# Patient Record
Sex: Male | Born: 1955 | Race: Black or African American | Hispanic: No | Marital: Married | Smoking: Current every day smoker
Health system: Southern US, Community
[De-identification: ages and names within clinical notes are randomized; demographics above are authoritative.]

## PROBLEM LIST (undated history)

## (undated) DIAGNOSIS — E291 Testicular hypofunction: Secondary | ICD-10-CM

## (undated) DIAGNOSIS — E785 Hyperlipidemia, unspecified: Secondary | ICD-10-CM

## (undated) DIAGNOSIS — C801 Malignant (primary) neoplasm, unspecified: Secondary | ICD-10-CM

## (undated) DIAGNOSIS — I10 Essential (primary) hypertension: Secondary | ICD-10-CM

## (undated) DIAGNOSIS — F32A Depression, unspecified: Secondary | ICD-10-CM

## (undated) DIAGNOSIS — C9000 Multiple myeloma not having achieved remission: Secondary | ICD-10-CM

## (undated) HISTORY — DX: Hyperlipidemia, unspecified: E78.5

## (undated) HISTORY — PX: BONE MARROW BIOPSY: SHX199

## (undated) HISTORY — DX: Testicular hypofunction: E29.1

## (undated) HISTORY — DX: Depression, unspecified: F32.A

---

## 2008-07-14 ENCOUNTER — Ambulatory Visit: Payer: Self-pay | Admitting: Unknown Physician Specialty

## 2008-07-14 HISTORY — PX: COLONOSCOPY: SHX174

## 2010-07-29 ENCOUNTER — Ambulatory Visit: Payer: Self-pay | Admitting: Otolaryngology

## 2013-12-01 DIAGNOSIS — I10 Essential (primary) hypertension: Secondary | ICD-10-CM | POA: Insufficient documentation

## 2013-12-01 DIAGNOSIS — E785 Hyperlipidemia, unspecified: Secondary | ICD-10-CM | POA: Insufficient documentation

## 2013-12-01 DIAGNOSIS — F325 Major depressive disorder, single episode, in full remission: Secondary | ICD-10-CM | POA: Insufficient documentation

## 2014-03-07 DIAGNOSIS — R053 Chronic cough: Secondary | ICD-10-CM | POA: Insufficient documentation

## 2015-02-02 DIAGNOSIS — R0683 Snoring: Secondary | ICD-10-CM | POA: Insufficient documentation

## 2016-09-06 ENCOUNTER — Emergency Department
Admission: EM | Admit: 2016-09-06 | Discharge: 2016-09-06 | Disposition: A | Discharge status: Home / Self Care | Payer: Self-pay | Attending: Emergency Medicine | Admitting: Emergency Medicine

## 2016-09-06 ENCOUNTER — Encounter: Payer: Self-pay | Admitting: Emergency Medicine

## 2016-09-06 ENCOUNTER — Emergency Department: Payer: Self-pay

## 2016-09-06 DIAGNOSIS — I10 Essential (primary) hypertension: Secondary | ICD-10-CM | POA: Insufficient documentation

## 2016-09-06 DIAGNOSIS — L03221 Cellulitis of neck: Secondary | ICD-10-CM

## 2016-09-06 DIAGNOSIS — F17200 Nicotine dependence, unspecified, uncomplicated: Secondary | ICD-10-CM | POA: Insufficient documentation

## 2016-09-06 DIAGNOSIS — Z79899 Other long term (current) drug therapy: Secondary | ICD-10-CM | POA: Insufficient documentation

## 2016-09-06 HISTORY — DX: Essential (primary) hypertension: I10

## 2016-09-06 LAB — CBC WITH DIFFERENTIAL/PLATELET
BASOS ABS: 0 10*3/uL (ref 0–0.1)
Basophils Relative: 1 %
Eosinophils Absolute: 0.3 10*3/uL (ref 0–0.7)
Eosinophils Relative: 5 %
HCT: 51.3 % (ref 40.0–52.0)
HEMOGLOBIN: 17 g/dL (ref 13.0–18.0)
LYMPHS ABS: 2.1 10*3/uL (ref 1.0–3.6)
Lymphocytes Relative: 33 %
MCH: 28.6 pg (ref 26.0–34.0)
MCHC: 33.2 g/dL (ref 32.0–36.0)
MCV: 86 fL (ref 80.0–100.0)
Monocytes Absolute: 0.6 10*3/uL (ref 0.2–1.0)
Monocytes Relative: 9 %
NEUTROS ABS: 3.5 10*3/uL (ref 1.4–6.5)
Neutrophils Relative %: 52 %
Platelets: 168 10*3/uL (ref 150–440)
RBC: 5.96 MIL/uL — AB (ref 4.40–5.90)
RDW: 15.3 % — ABNORMAL HIGH (ref 11.5–14.5)
WBC: 6.5 10*3/uL (ref 3.8–10.6)

## 2016-09-06 LAB — COMPREHENSIVE METABOLIC PANEL
ALK PHOS: 47 U/L (ref 38–126)
ALT: 23 U/L (ref 17–63)
AST: 21 U/L (ref 15–41)
Albumin: 4 g/dL (ref 3.5–5.0)
Anion gap: 6 (ref 5–15)
BUN: 9 mg/dL (ref 6–20)
CALCIUM: 9.3 mg/dL (ref 8.9–10.3)
CO2: 26 mmol/L (ref 22–32)
CREATININE: 0.98 mg/dL (ref 0.61–1.24)
Chloride: 106 mmol/L (ref 101–111)
Glucose, Bld: 93 mg/dL (ref 65–99)
Potassium: 4.7 mmol/L (ref 3.5–5.1)
Sodium: 138 mmol/L (ref 135–145)
Total Bilirubin: 0.9 mg/dL (ref 0.3–1.2)
Total Protein: 7.2 g/dL (ref 6.5–8.1)

## 2016-09-06 MED ORDER — IOPAMIDOL (ISOVUE-300) INJECTION 61%
75.0000 mL | Freq: Once | INTRAVENOUS | Status: AC | PRN
  Administered 2016-09-06: 75 mL via INTRAVENOUS
  Filled 2016-09-06: qty 75

## 2016-09-06 MED ORDER — SULFAMETHOXAZOLE-TRIMETHOPRIM 800-160 MG PO TABS: 1.0000 | ORAL_TABLET | Freq: Two times a day (BID) | ORAL | 0 refills | Status: DC

## 2016-09-06 MED ORDER — CEPHALEXIN 500 MG PO CAPS: 500.0000 mg | ORAL_CAPSULE | Freq: Three times a day (TID) | ORAL | 0 refills | Status: AC

## 2016-09-06 NOTE — ED Triage Notes (Signed)
Pt sent here from [facility] for possible tonsilar abscess. Pt reports sore throat when swallowing, no drool, voice clear.

## 2016-09-06 NOTE — ED Provider Notes (Signed)
Medical screening examination/treatment/procedure(s) were conducted as a shared visit with non-physician practitioner(s) and myself.  I personally evaluated the patient during the encounter.  Case discussed with NP Triplett, think the patient has a small localized cellulitis over the left anterior neck. No evidence of Ludwig angina or other airways compromise. No evidence of abscess. Not consistent with necrotizing fasciitis as there is no crepitus, patient afebrile and very well appearing.  Counseled the patient on taking oral antibodies for the next week for this. I expect it will heal up without any trouble. Did counsel him on return precautions and to come back immediately if he has any worsening shortness of breath cough dysphagia or pain in the area or feels like he has an enlarging mass in the neck.  CT report was reviewed and considered in my medical decision making.   Carrie Mew, MD 09/06/16 1314

## 2016-09-06 NOTE — ED Notes (Signed)
Sent over from [facility] with sore throat  States he developed some swelling to left side of throat couple of days ago states he thinks he has an abscess  Pt is afebrile on arrival

## 2016-09-06 NOTE — ED Provider Notes (Signed)
[facility] Emergency Department Provider Note  ____________________________________________  Time seen: Approximately 9:42 AM  I have reviewed the triage vital signs and the nursing notes.   HISTORY  Chief Complaint Sore Throat    HPI Tommy Smith. is a 61 y.o. male who presents to the emergency department for evaluation of a lesion on his neck that is present since Saturday. He states that at that time, he noticed a tender area, but didn't give it much thought until Sunday it began getting bigger. He states that he went into the walk-in clinic today because he thought it was an abscess or an ingrown hair and was sent to the emergency department. He denies any fever, shortness of breath, dysphasia, or any other symptoms of concern with the exception of the possibility that there may be infection. He has not taken any medications or tried any treatments prior to coming for evaluation. He denies previous similar symptoms  Past Medical History:  Diagnosis Date  . Hypertension     There are no active problems to display for this patient.   History reviewed. No pertinent surgical history.  Prior to Admission medications   Medication Sig Start Date End Date Taking? Authorizing Provider  atorvastatin (LIPITOR) 40 MG tablet Take 40 mg by mouth daily.   Yes [provider]  losartan-hydrochlorothiazide (HYZAAR) 100-25 MG tablet Take 1 tablet by mouth daily.   Yes [provider]  cephALEXin (KEFLEX) 500 MG capsule Take 1 capsule (500 mg total) by mouth 3 (three) times daily. 09/06/16 09/16/16  Brown Dunlap, Johnette Abraham B, FNP  sulfamethoxazole-trimethoprim (BACTRIM DS,SEPTRA DS) 800-160 MG tablet Take 1 tablet by mouth 2 (two) times daily. 09/06/16   Victorino Dike, FNP    Allergies Patient has no known allergies.  No family history on file.  Social History Social History  Substance Use Topics  . Smoking status: Smoker, Current Status Unknown   . Smokeless tobacco: Never Used  . Alcohol use No    Review of Systems Constitutional: Negative for fever. Eyes: No visual changes. ENT: Negative for sore throat; Negative for difficulty swallowing. Respiratory: Denies shortness of breath. Gastrointestinal: No abdominal pain.  No nausea, no vomiting.  No diarrhea.  Genitourinary: Negative for dysuria. Musculoskeletal: Negative for generalized body aches. Skin: Negative for rash. Positive for lesion. Neurological: Negative for headaches, focal weakness or numbness.  ____________________________________________   PHYSICAL EXAM:  VITAL SIGNS: ED Triage Vitals [09/06/16 0843]  Enc Vitals Group     BP      Pulse      Resp      Temp 98.3 F (36.8 C)     Temp Source Oral     SpO2      Weight 280 lb (127 kg)     Height 6\' 4"  (1.93 m)     Head Circumference      Peak Flow      Pain Score      Pain Loc      Pain Edu?      Excl. in [place]?    Constitutional: Alert and oriented. Well appearing and in no acute distress. Eyes: Conjunctivae are normal. PERRL. EOMI. Head: Atraumatic. Nose: No congestion/rhinnorhea. Mouth/Throat: Mucous membranes are moist.  Oropharynx normal, no tonsillar exudate. Neck: No stridor. Skin of the anterior portion of the neck is loose. Lesion/mass is mildly erythematous, mobile, and approximately 4cm diameter without fluctuance.  Lymphatic: Lymphadenopathy: None Cardiovascular: Normal rate, regular rhythm. Good peripheral circulation. Respiratory: Normal respiratory  effort. Lungs CTAB. Musculoskeletal: No lower extremity tenderness nor edema.   Neurologic:  Normal speech and language. No gross focal neurologic deficits are appreciated. Speech is normal. No gait instability. Skin:  Skin is warm, dry and intact. No rash noted Psychiatric: Mood and affect are normal. Speech and behavior are normal.  ____________________________________________   LABS (all labs ordered are listed, but only abnormal  results are displayed)  Labs Reviewed  CBC WITH DIFFERENTIAL/PLATELET - Abnormal; Notable for the following:       Result Value   RBC 5.96 (*)    RDW 15.3 (*)    All other components within normal limits  COMPREHENSIVE METABOLIC PANEL   ____________________________________________  EKG   ____________________________________________  RADIOLOGY  CT Soft tissue neck with contrast:  New submental space edema/inflammation, nonspecific. No abscess. Mild submental lymph node enlargement, likely reactive. ____________________________________________   PROCEDURES  Procedure(s) performed: None  Critical Care performed: No ____________________________________________   INITIAL IMPRESSION / ASSESSMENT AND PLAN / ED COURSE  Pertinent labs & imaging results that were available during my care of the patient were reviewed by me and considered in my medical decision making (see chart for details).  61 year old male presenting to the emergency department for evaluation of a lesion on the anterior portion of his neck is present for the past 3 or 4 days. Elasticity of the skin would provide a large area of extension of any abscess or mass, therefore CT with contrast ordered to better assess the depth and character of the lesion. CT does not show any localized, drainable fluid collection. There is no evidence of Ludwigs angina and the patient continued to remain asymptomatic while in the emergency department. He'll be treated for an early cellulitis with Bactrim and Keflex. Dr. Joni Fears was included in the patient's care and he also examined the patient and feels that he is safe for discharge at this time. Strict return precautions were discussed. He is to return for any concerns. He is also to schedule an appointment to see his primary care provider in 2 days for recheck. ____________________________________________  Discharge Medication List as of 09/06/2016  1:13 PM    START taking these  medications   Details  cephALEXin (KEFLEX) 500 MG capsule Take 1 capsule (500 mg total) by mouth 3 (three) times daily., Starting Tue 09/06/2016, Until Fri 09/16/2016, Print    sulfamethoxazole-trimethoprim (BACTRIM DS,SEPTRA DS) 800-160 MG tablet Take 1 tablet by mouth 2 (two) times daily., Starting Tue 09/06/2016, Print        FINAL CLINICAL IMPRESSION(S) / ED DIAGNOSES  Final diagnoses:  Cellulitis of neck    If controlled substance prescribed during this visit, 12 month history viewed on the [facility] prior to issuing an initial prescription for Schedule II or III opiod.   Note:  This document was prepared using Dragon voice recognition software and may include unintentional dictation errors.    Victorino Dike, FNP 09/06/16 1413    Carrie Mew, MD 09/06/16 2027

## 2019-02-05 ENCOUNTER — Other Ambulatory Visit: Payer: Self-pay | Admitting: Physician Assistant

## 2019-02-05 DIAGNOSIS — Z136 Encounter for screening for cardiovascular disorders: Secondary | ICD-10-CM

## 2019-05-02 DIAGNOSIS — F17200 Nicotine dependence, unspecified, uncomplicated: Secondary | ICD-10-CM | POA: Insufficient documentation

## 2019-05-13 ENCOUNTER — Other Ambulatory Visit: Payer: Self-pay | Admitting: Physician Assistant

## 2019-05-13 DIAGNOSIS — Z136 Encounter for screening for cardiovascular disorders: Secondary | ICD-10-CM

## 2019-05-13 DIAGNOSIS — F17200 Nicotine dependence, unspecified, uncomplicated: Secondary | ICD-10-CM

## 2019-06-05 ENCOUNTER — Other Ambulatory Visit: Payer: Self-pay

## 2019-06-05 ENCOUNTER — Ambulatory Visit
Admission: RE | Admit: 2019-06-05 | Discharge: 2019-06-05 | Disposition: A | Discharge status: Home / Self Care | Payer: Medicare Other | Source: Ambulatory Visit | Attending: Physician Assistant | Admitting: Physician Assistant

## 2019-06-05 DIAGNOSIS — F17200 Nicotine dependence, unspecified, uncomplicated: Secondary | ICD-10-CM | POA: Diagnosis present

## 2019-06-05 DIAGNOSIS — Z136 Encounter for screening for cardiovascular disorders: Secondary | ICD-10-CM | POA: Diagnosis not present

## 2019-06-05 DIAGNOSIS — I77811 Abdominal aortic ectasia: Secondary | ICD-10-CM | POA: Diagnosis not present

## 2019-07-08 ENCOUNTER — Other Ambulatory Visit: Payer: Self-pay | Admitting: Physician Assistant

## 2019-07-08 DIAGNOSIS — I10 Essential (primary) hypertension: Secondary | ICD-10-CM

## 2019-07-08 DIAGNOSIS — R918 Other nonspecific abnormal finding of lung field: Secondary | ICD-10-CM

## 2020-01-22 ENCOUNTER — Other Ambulatory Visit: Payer: Self-pay | Admitting: Family Medicine

## 2020-01-22 DIAGNOSIS — R109 Unspecified abdominal pain: Secondary | ICD-10-CM

## 2020-01-23 ENCOUNTER — Other Ambulatory Visit: Payer: Self-pay | Admitting: Family Medicine

## 2020-01-23 DIAGNOSIS — R911 Solitary pulmonary nodule: Secondary | ICD-10-CM

## 2020-01-29 ENCOUNTER — Ambulatory Visit: Payer: Medicare Other

## 2020-01-29 ENCOUNTER — Ambulatory Visit
Admission: RE | Admit: 2020-01-29 | Discharge: 2020-01-29 | Disposition: A | Discharge status: Home / Self Care | Payer: Medicare Other | Source: Ambulatory Visit | Attending: Family Medicine | Admitting: Family Medicine

## 2020-01-29 ENCOUNTER — Other Ambulatory Visit: Payer: Self-pay

## 2020-01-29 DIAGNOSIS — R109 Unspecified abdominal pain: Secondary | ICD-10-CM | POA: Insufficient documentation

## 2020-01-29 DIAGNOSIS — R911 Solitary pulmonary nodule: Secondary | ICD-10-CM | POA: Insufficient documentation

## 2020-01-29 MED ORDER — IOHEXOL 300 MG/ML  SOLN
100.0000 mL | Freq: Once | INTRAMUSCULAR | Status: AC | PRN
  Administered 2020-01-29: 100 mL via INTRAVENOUS

## 2020-02-04 ENCOUNTER — Inpatient Hospital Stay: Payer: Medicare Other

## 2020-02-04 ENCOUNTER — Inpatient Hospital Stay: Payer: Medicare Other | Attending: Oncology | Admitting: Oncology

## 2020-02-04 ENCOUNTER — Encounter: Payer: Self-pay | Admitting: Oncology

## 2020-02-04 ENCOUNTER — Other Ambulatory Visit: Payer: Self-pay

## 2020-02-04 VITALS — BP 135/84 | HR 70 | Temp 97.8°F | Resp 18 | Ht 74.0 in | Wt 270.8 lb

## 2020-02-04 DIAGNOSIS — M899 Disorder of bone, unspecified: Secondary | ICD-10-CM | POA: Diagnosis not present

## 2020-02-04 DIAGNOSIS — F329 Major depressive disorder, single episode, unspecified: Secondary | ICD-10-CM | POA: Insufficient documentation

## 2020-02-04 DIAGNOSIS — R778 Other specified abnormalities of plasma proteins: Secondary | ICD-10-CM

## 2020-02-04 DIAGNOSIS — R918 Other nonspecific abnormal finding of lung field: Secondary | ICD-10-CM

## 2020-02-04 DIAGNOSIS — E291 Testicular hypofunction: Secondary | ICD-10-CM | POA: Diagnosis not present

## 2020-02-04 DIAGNOSIS — J984 Other disorders of lung: Secondary | ICD-10-CM | POA: Insufficient documentation

## 2020-02-04 DIAGNOSIS — Z7189 Other specified counseling: Secondary | ICD-10-CM

## 2020-02-04 DIAGNOSIS — M8580 Other specified disorders of bone density and structure, unspecified site: Secondary | ICD-10-CM | POA: Insufficient documentation

## 2020-02-04 DIAGNOSIS — F17210 Nicotine dependence, cigarettes, uncomplicated: Secondary | ICD-10-CM | POA: Diagnosis not present

## 2020-02-04 DIAGNOSIS — M898X9 Other specified disorders of bone, unspecified site: Secondary | ICD-10-CM

## 2020-02-04 DIAGNOSIS — I700 Atherosclerosis of aorta: Secondary | ICD-10-CM | POA: Diagnosis not present

## 2020-02-04 DIAGNOSIS — R937 Abnormal findings on diagnostic imaging of other parts of musculoskeletal system: Secondary | ICD-10-CM | POA: Insufficient documentation

## 2020-02-04 DIAGNOSIS — M545 Low back pain, unspecified: Secondary | ICD-10-CM | POA: Diagnosis not present

## 2020-02-04 DIAGNOSIS — M4854XA Collapsed vertebra, not elsewhere classified, thoracic region, initial encounter for fracture: Secondary | ICD-10-CM | POA: Insufficient documentation

## 2020-02-04 DIAGNOSIS — I10 Essential (primary) hypertension: Secondary | ICD-10-CM | POA: Insufficient documentation

## 2020-02-04 DIAGNOSIS — E785 Hyperlipidemia, unspecified: Secondary | ICD-10-CM | POA: Insufficient documentation

## 2020-02-04 DIAGNOSIS — S22070A Wedge compression fracture of T9-T10 vertebra, initial encounter for closed fracture: Secondary | ICD-10-CM

## 2020-02-04 DIAGNOSIS — R748 Abnormal levels of other serum enzymes: Secondary | ICD-10-CM | POA: Insufficient documentation

## 2020-02-04 DIAGNOSIS — Z79899 Other long term (current) drug therapy: Secondary | ICD-10-CM | POA: Insufficient documentation

## 2020-02-04 LAB — CBC WITH DIFFERENTIAL/PLATELET
Abs Immature Granulocytes: 0.02 10*3/uL (ref 0.00–0.07)
Basophils Absolute: 0 10*3/uL (ref 0.0–0.1)
Basophils Relative: 1 %
Eosinophils Absolute: 0.1 10*3/uL (ref 0.0–0.5)
Eosinophils Relative: 3 %
HCT: 35.6 % — ABNORMAL LOW (ref 39.0–52.0)
Hemoglobin: 11.9 g/dL — ABNORMAL LOW (ref 13.0–17.0)
Immature Granulocytes: 1 %
Lymphocytes Relative: 49 %
Lymphs Abs: 2 10*3/uL (ref 0.7–4.0)
MCH: 29.8 pg (ref 26.0–34.0)
MCHC: 33.4 g/dL (ref 30.0–36.0)
MCV: 89.2 fL (ref 80.0–100.0)
Monocytes Absolute: 0.4 10*3/uL (ref 0.1–1.0)
Monocytes Relative: 10 %
Neutro Abs: 1.5 10*3/uL — ABNORMAL LOW (ref 1.7–7.7)
Neutrophils Relative %: 36 %
Platelets: 174 10*3/uL (ref 150–400)
RBC: 3.99 MIL/uL — ABNORMAL LOW (ref 4.22–5.81)
RDW: 15 % (ref 11.5–15.5)
WBC: 4.1 10*3/uL (ref 4.0–10.5)
nRBC: 0 % (ref 0.0–0.2)

## 2020-02-04 LAB — COMPREHENSIVE METABOLIC PANEL
ALT: 24 U/L (ref 0–44)
AST: 22 U/L (ref 15–41)
Albumin: 2.7 g/dL — ABNORMAL LOW (ref 3.5–5.0)
Alkaline Phosphatase: 40 U/L (ref 38–126)
Anion gap: 9 (ref 5–15)
BUN: 13 mg/dL (ref 8–23)
CO2: 27 mmol/L (ref 22–32)
Calcium: 11.5 mg/dL — ABNORMAL HIGH (ref 8.9–10.3)
Chloride: 100 mmol/L (ref 98–111)
Creatinine, Ser: 1.01 mg/dL (ref 0.61–1.24)
GFR, Estimated: >60 mL/min (ref >60)
Glucose, Bld: 89 mg/dL (ref 70–99)
Potassium: 3.6 mmol/L (ref 3.5–5.1)
Sodium: 136 mmol/L (ref 135–145)
Total Bilirubin: 0.7 mg/dL (ref 0.3–1.2)
Total Protein: 9.8 g/dL — ABNORMAL HIGH (ref 6.5–8.1)

## 2020-02-04 LAB — PSA: Prostatic Specific Antigen: 0.03 ng/mL (ref 0.00–4.00)

## 2020-02-04 NOTE — Progress Notes (Signed)
Hematology/Oncology Consult note [facility] Telephone:([phone] [phone]   Patient Care Team: [name], [place] as PCP - General (Physician Assistant)  REFERRING PROVIDER: Sofie Hartigan, MD  CHIEF COMPLAINTS/REASON FOR VISIT:  Evaluation of abnormal serum protein electrophoresis  HISTORY OF PRESENTING ILLNESS:   Tommy Smith. is a  64 y.o.  male with PMH listed below was seen in consultation at the request of  Feldpausch, Chrissie Noa, MD  for evaluation of abnormal serum protein electrophoresis.  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  Increase calcium level at 12.3, PTH was normal.  Creatinine 1, estimated GFR 91, CBC showed hemoglobin 12.4, Patient also recently had a CT chest abdomen pelvis with contrast for evaluation of right flank pain and right lower lobe nodule. 01/29/2020, CT showed multiple bilateral pulmonary nodules measuring up to 10 mm in the right middle lobe, nodules are indeterminate, infectious/inflammatory etiology versus metastatic disease. Multiple osseous lytic lesions, largest lesion involving the left pubic bone concerning for metastatic disease. Compression fractures at T9, age indeterminate. Appearance focal area of thickening at the gastroesophageal junction.  Further evaluation with upper GI study or direct visualization with endoscopy is recommended No bowel obstruction. Aortic atherosclerosis.  Patient reports NSAIDs as needed for lower back pain.  Mid lower back pain usually is worse when when he stands up from sitting position.  Patient works in the [employer].  Lives at home with wife.  Denies any unintentional weight loss, fever, chills, night sweating  Review of Systems  Constitutional: Negative for appetite change, chills, fatigue, fever and unexpected weight change.  HENT:   Negative for hearing loss and voice change.   Eyes: Negative for eye problems and  icterus.  Respiratory: Negative for chest tightness, cough and shortness of breath.   Cardiovascular: Negative for chest pain and leg swelling.  Gastrointestinal: Negative for abdominal distention and abdominal pain.  Endocrine: Negative for hot flashes.  Genitourinary: Negative for difficulty urinating, dysuria and frequency.   Musculoskeletal: Positive for back pain. Negative for arthralgias.  Skin: Negative for itching and rash.  Neurological: Negative for light-headedness and numbness.  Hematological: Negative for adenopathy. Does not bruise/bleed easily.  Psychiatric/Behavioral: Negative for confusion.    MEDICAL HISTORY:  Past Medical History:  Diagnosis Date  . Depression   . Hyperlipemia   . Hypertension   . Hypogonadism in male     SURGICAL HISTORY: Past Surgical History:  Procedure Laterality Date  . COLONOSCOPY  07/14/2008    SOCIAL HISTORY: Social History   Socioeconomic History  . Marital status: Married    Spouse name: Not on file  . Number of children: Not on file  . Years of education: Not on file  . Highest education level: Not on file  Occupational History  . Not on file  Tobacco Use  . Smoking status: Current Every Day Smoker    Packs/day: 1.00    Years: 30.00    Pack years: 30.00    Types: Cigarettes  . Smokeless tobacco: Never Used  Substance and Sexual Activity  . Alcohol use: No  . Drug use: No  . Sexual activity: Not on file  Other Topics Concern  . Not on file  Social History Narrative  . Not on file   Social Determinants of Health   Financial Resource Strain:   . Difficulty of Paying Living Expenses: Not on file  Food Insecurity:   . Worried About Charity fundraiser in the Last Year:  Not on file  . Ran Out of Food in the Last Year: Not on file  Transportation Needs:   . Lack of Transportation (Medical): Not on file  . Lack of Transportation (Non-Medical): Not on file  Physical Activity:   . Days of Exercise per Week: Not on  file  . Minutes of Exercise per Session: Not on file  Stress:   . Feeling of Stress : Not on file  Social Connections:   . Frequency of Communication with Friends and Family: Not on file  . Frequency of Social Gatherings with Friends and Family: Not on file  . Attends Religious Services: Not on file  . Active Member of Clubs or Organizations: Not on file  . Attends Archivist Meetings: Not on file  . Marital Status: Not on file  Intimate Partner Violence:   . Fear of Current or Ex-Partner: Not on file  . Emotionally Abused: Not on file  . Physically Abused: Not on file  . Sexually Abused: Not on file    FAMILY HISTORY: Family History  Problem Relation Age of Onset  . Skin cancer Mother   . Prostate cancer Father     ALLERGIES:  has No Known Allergies.  MEDICATIONS:  Current Outpatient Medications  Medication Sig Dispense Refill  . amLODipine (NORVASC) 10 MG tablet Take by mouth.    Marland Kitchen atorvastatin (LIPITOR) 40 MG tablet Take 40 mg by mouth daily.    Marland Kitchen buPROPion (WELLBUTRIN XL) 150 MG 24 hr tablet Take by mouth.    . dutasteride (AVODART) 0.5 MG capsule Take by mouth.    Marland Kitchen ibuprofen (ADVIL) 200 MG tablet Take 200 mg by mouth every 6 (six) hours as needed.    Marland Kitchen losartan-hydrochlorothiazide (HYZAAR) 100-12.5 MG tablet Take 1 tablet by mouth daily.    . Psyllium (METAMUCIL MULTIHEALTH FIBER PO) Take by mouth daily.     No current facility-administered medications for this visit.     PHYSICAL EXAMINATION: ECOG PERFORMANCE STATUS: 0 - Asymptomatic Vitals:   02/04/20 0953  BP: 135/84  Pulse: 70  Resp: 18  Temp: 97.8 F (36.6 C)   Filed Weights   02/04/20 0953  Weight: 270 lb 12.8 oz (122.8 kg)    Physical Exam Constitutional:      General: He is not in acute distress. HENT:     Head: Normocephalic and atraumatic.  Eyes:     General: No scleral icterus. Cardiovascular:     Rate and Rhythm: Normal rate and regular rhythm.     Heart sounds: Normal  heart sounds.  Pulmonary:     Effort: Pulmonary effort is normal. No respiratory distress.     Breath sounds: No wheezing.  Abdominal:     General: Bowel sounds are normal. There is no distension.     Palpations: Abdomen is soft.  Musculoskeletal:        General: No deformity. Normal range of motion.     Cervical back: Normal range of motion and neck supple.  Skin:    General: Skin is warm and dry.     Findings: No erythema or rash.  Neurological:     Mental Status: He is alert and oriented to person, place, and time. Mental status is at baseline.     Cranial Nerves: No cranial nerve deficit.     Coordination: Coordination normal.  Psychiatric:        Mood and Affect: Mood normal.     LABORATORY DATA:  I have reviewed the data  as listed Lab Results  Component Value Date   WBC 4.1 02/04/2020   HGB 11.9 (L) 02/04/2020   HCT 35.6 (L) 02/04/2020   MCV 89.2 02/04/2020   PLT 174 02/04/2020   Recent Labs    02/04/20 1029  NA 136  K 3.6  CL 100  CO2 27  GLUCOSE 89  BUN 13  CREATININE 1.01  CALCIUM 11.5*  GFRNONAA >60  PROT 9.8*  ALBUMIN 2.7*  AST 22  ALT 24  ALKPHOS 40  BILITOT 0.7   Iron/TIBC/Ferritin/ %Sat No results found for: IRON, TIBC, FERRITIN, IRONPCTSAT    RADIOGRAPHIC STUDIES: I have personally reviewed the radiological images as listed and agreed with the findings in the report. CT CHEST W CONTRAST  Result Date: 01/30/2020 CLINICAL DATA:  64 year old male with right flank pain. Right lower lobe pulmonary nodule. EXAM: CT CHEST, ABDOMEN, AND PELVIS WITH CONTRAST TECHNIQUE: Multidetector CT imaging of the chest, abdomen and pelvis was performed following the standard protocol during bolus administration of intravenous contrast. CONTRAST:  137m OMNIPAQUE IOHEXOL 300 MG/ML  SOLN COMPARISON:  None. FINDINGS: CT CHEST FINDINGS Cardiovascular: There is no cardiomegaly or pericardial effusion. The thoracic aorta is unremarkable. The origins of the great vessels  of the aortic arch appear patent as visualized. The central pulmonary arteries are patent. Mediastinum/Nodes: There is no hilar or mediastinal adenopathy. The esophagus and the thyroid gland are grossly unremarkable. No mediastinal fluid collection. Lungs/Pleura: There are bibasilar linear atelectasis/scarring. Multiple pulmonary nodules predominantly involving the lower lungs including a 10 mm right middle lobe nodule (98/3), a 6 mm right lower lobe subpleural nodule (93/3), a 4 mm right middle lobe nodule (80/3), a 4 mm lingular nodule (67/3), and a 3 mm left lower lobe subpleural nodule (114/3). No focal consolidation, pleural effusion, or pneumothorax. The central airways are patent. Musculoskeletal: Old healed right anterior sixth rib fracture. There is a small indeterminate lucency in the anterior aspect of the right sixth rib anterior to the old fracture (93/3) which is not evaluated on this CT. Clinical correlation and attention on follow-up recommended. If there is history of known primary cancer or other concern for metastatic disease, bone scan may provide better evaluation. Several additional faint lucency is noted in the sternum. There is compression fracture of T9 with approximately 40% loss of vertebral body height, age indeterminate. Correlation with clinical exam and point tenderness recommended. There is mild perivertebral edema at this level and therefore an acute or subacute fracture is a possibility. No retropulsed fragment. CT ABDOMEN PELVIS FINDINGS No intra-abdominal free air or free fluid. Hepatobiliary: The liver is unremarkable. No intrahepatic biliary ductal dilatation. The gallbladder is unremarkable. Pancreas: Unremarkable. No pancreatic ductal dilatation or surrounding inflammatory changes. Spleen: Normal in size without focal abnormality. Adrenals/Urinary Tract: The adrenal glands are unremarkable. There is no hydronephrosis on either side. There is symmetric enhancement and excretion  of contrast by both kidneys. The visualized ureters and urinary bladder appear unremarkable. Stomach/Bowel: There is apparent focal area of thickening at the gastroesophageal junction (51/2 and coronal 86/4). This may be related to underdistention, however underlying lesion is not excluded. Further evaluation with upper GI study or direct visualization with endoscopy is recommended. There is no bowel obstruction or active inflammation. The appendix is normal. Vascular/Lymphatic: Mild aortoiliac atherosclerotic disease. The IVC is unremarkable. No portal venous gas. There is no adenopathy. Reproductive: The prostate and seminal vesicles are grossly unremarkable. No pelvic mass. Other: None Musculoskeletal: Osteopenia with degenerative changes of the spine. There  is a 3.4 x 2.5 cm lytic lesion in the left pubic bone as well as a smaller lytic lesion in the right pubic bone. Additional faint subcentimeter lucencies may be present within the spine. Age indeterminate, old-appearing compression fracture of the inferior endplate of L4. No acute osseous pathology. IMPRESSION: 1. Multiple bilateral pulmonary nodules measuring up to 10 mm in the right middle lobe. These nodules are indeterminate and may be inflammatory/infectious in etiology. However, metastatic disease is not excluded in light of osseous findings. Clinical correlation and multidisciplinary consult advised. 2. Multiple osseous lytic lesions with the largest involving the left pubic bone concerning for metastatic disease. Further evaluation with bone scan recommended. 3. Compression fracture of T9 with approximately 40% loss of vertebral body height, age indeterminate. Correlation with clinical exam and point tenderness recommended. 4. Apparent focal area of thickening at the gastroesophageal junction. Further evaluation with upper GI study or direct visualization with endoscopy is recommended. 5. No bowel obstruction. Normal appendix. 6. Aortic  Atherosclerosis (ICD10-I70.0). Electronically Signed   By: Anner Crete M.D.   On: 01/30/2020 20:06   CT ABDOMEN PELVIS W CONTRAST  Result Date: 01/30/2020 CLINICAL DATA:  64 year old male with right flank pain. Right lower lobe pulmonary nodule. EXAM: CT CHEST, ABDOMEN, AND PELVIS WITH CONTRAST TECHNIQUE: Multidetector CT imaging of the chest, abdomen and pelvis was performed following the standard protocol during bolus administration of intravenous contrast. CONTRAST:  166m OMNIPAQUE IOHEXOL 300 MG/ML  SOLN COMPARISON:  None. FINDINGS: CT CHEST FINDINGS Cardiovascular: There is no cardiomegaly or pericardial effusion. The thoracic aorta is unremarkable. The origins of the great vessels of the aortic arch appear patent as visualized. The central pulmonary arteries are patent. Mediastinum/Nodes: There is no hilar or mediastinal adenopathy. The esophagus and the thyroid gland are grossly unremarkable. No mediastinal fluid collection. Lungs/Pleura: There are bibasilar linear atelectasis/scarring. Multiple pulmonary nodules predominantly involving the lower lungs including a 10 mm right middle lobe nodule (98/3), a 6 mm right lower lobe subpleural nodule (93/3), a 4 mm right middle lobe nodule (80/3), a 4 mm lingular nodule (67/3), and a 3 mm left lower lobe subpleural nodule (114/3). No focal consolidation, pleural effusion, or pneumothorax. The central airways are patent. Musculoskeletal: Old healed right anterior sixth rib fracture. There is a small indeterminate lucency in the anterior aspect of the right sixth rib anterior to the old fracture (93/3) which is not evaluated on this CT. Clinical correlation and attention on follow-up recommended. If there is history of known primary cancer or other concern for metastatic disease, bone scan may provide better evaluation. Several additional faint lucency is noted in the sternum. There is compression fracture of T9 with approximately 40% loss of vertebral body  height, age indeterminate. Correlation with clinical exam and point tenderness recommended. There is mild perivertebral edema at this level and therefore an acute or subacute fracture is a possibility. No retropulsed fragment. CT ABDOMEN PELVIS FINDINGS No intra-abdominal free air or free fluid. Hepatobiliary: The liver is unremarkable. No intrahepatic biliary ductal dilatation. The gallbladder is unremarkable. Pancreas: Unremarkable. No pancreatic ductal dilatation or surrounding inflammatory changes. Spleen: Normal in size without focal abnormality. Adrenals/Urinary Tract: The adrenal glands are unremarkable. There is no hydronephrosis on either side. There is symmetric enhancement and excretion of contrast by both kidneys. The visualized ureters and urinary bladder appear unremarkable. Stomach/Bowel: There is apparent focal area of thickening at the gastroesophageal junction (51/2 and coronal 86/4). This may be related to underdistention, however underlying lesion is not  excluded. Further evaluation with upper GI study or direct visualization with endoscopy is recommended. There is no bowel obstruction or active inflammation. The appendix is normal. Vascular/Lymphatic: Mild aortoiliac atherosclerotic disease. The IVC is unremarkable. No portal venous gas. There is no adenopathy. Reproductive: The prostate and seminal vesicles are grossly unremarkable. No pelvic mass. Other: None Musculoskeletal: Osteopenia with degenerative changes of the spine. There is a 3.4 x 2.5 cm lytic lesion in the left pubic bone as well as a smaller lytic lesion in the right pubic bone. Additional faint subcentimeter lucencies may be present within the spine. Age indeterminate, old-appearing compression fracture of the inferior endplate of L4. No acute osseous pathology. IMPRESSION: 1. Multiple bilateral pulmonary nodules measuring up to 10 mm in the right middle lobe. These nodules are indeterminate and may be inflammatory/infectious in  etiology. However, metastatic disease is not excluded in light of osseous findings. Clinical correlation and multidisciplinary consult advised. 2. Multiple osseous lytic lesions with the largest involving the left pubic bone concerning for metastatic disease. Further evaluation with bone scan recommended. 3. Compression fracture of T9 with approximately 40% loss of vertebral body height, age indeterminate. Correlation with clinical exam and point tenderness recommended. 4. Apparent focal area of thickening at the gastroesophageal junction. Further evaluation with upper GI study or direct visualization with endoscopy is recommended. 5. No bowel obstruction. Normal appendix. 6. Aortic Atherosclerosis (ICD10-I70.0). Electronically Signed   By: Anner Crete M.D.   On: 01/30/2020 20:06      ASSESSMENT & PLAN:  1. Abnormal SPEP   2. Lytic bone lesions on xray   3. Lung nodules   4. Compression fracture of T9 vertebra, initial encounter ([place])   5. Goals of care, counseling/discussion    #Previous labs reviewed and discussed with patient Suspect plasma cell disorder i.e. multiple myeloma.  CT findings were discussed with patient.  T9 compression fracture as well as multiple lytic lesions. Recommend patient to check CBC, CMP, beta-2 microglobulin, multiple myeloma panel, free light chain ratio, PSA, urine protein electrophoresis Discussed about the possibility of bone marrow biopsy for further evaluation.  We will arrange patient to get bone marrow biopsy ASAP.  Light chain ratio is significantly elevated.  Involved kappa light chain level 333. Encourage oral hydration.  Advised patient avoid nephrotoxins.  #Multiple small lung nodules, infectious/inflammation versus metastatic disease. Tobacco use, smoke cessation was discussed with patient #Hypercalcemia, corrected calcium level 12.5.  Encourage oral hydration.  Consider bisphosphonate after bone marrow biopsy. #T9 compression fracture,  age-indeterminate.  Recommend patient to avoid NSAIDs.  Avoid heavy lifting. Recommend Tylenol 650 mg every 6 hours as needed for pain.  Patient will update me if Tylenol does not help his pain.  #Thickening of the GE junction on CT scan.  Will need GI work-up.  Orders Placed This Encounter  Procedures  . Multiple Myeloma Panel (SPEP&IFE w/QIG)    Standing Status:   Future    Number of Occurrences:   1    Standing Expiration Date:   02/03/2021  . Kappa/lambda light chains    Standing Status:   Future    Number of Occurrences:   1    Standing Expiration Date:   02/03/2021  . CBC with Differential/Platelet    Standing Status:   Future    Number of Occurrences:   1    Standing Expiration Date:   02/03/2021  . Comprehensive metabolic panel    Standing Status:   Future    Number of Occurrences:  1    Standing Expiration Date:   02/03/2021  . Beta 2 microglobuline, serum    Standing Status:   Future    Number of Occurrences:   1    Standing Expiration Date:   02/03/2021  . PSA    Standing Status:   Future    Number of Occurrences:   1    Standing Expiration Date:   02/03/2021  . Protein Electro, Random Urine    Standing Status:   Future    Standing Expiration Date:   02/03/2021    All questions were answered. The patient knows to call the clinic with any problems questions or concerns.  cc Tommy Hartigan, MD    Return of visit: 2 weeks after bone marrow biopsy. Thank you for this kind referral and the opportunity to participate in the care of this patient. A copy of today's note is routed to referring provider    Earlie Server, MD, PhD Hematology Oncology [facility] at [facility] Pager- [phone] 02/04/2020

## 2020-02-04 NOTE — Progress Notes (Signed)
New patient evaluation.   

## 2020-02-05 LAB — PROTEIN ELECTRO, RANDOM URINE
Albumin ELP, Urine: 15 %
Alpha-1-Globulin, U: 3.4 %
Alpha-2-Globulin, U: 16.7 %
Beta Globulin, U: 46.7 %
Gamma Globulin, U: 18.2 %
M Component, Ur: 25.3 % — ABNORMAL HIGH
Total Protein, Urine: 11 mg/dL

## 2020-02-05 LAB — KAPPA/LAMBDA LIGHT CHAINS
Kappa free light chain: 333.8 mg/L — ABNORMAL HIGH (ref 3.3–19.4)
Kappa, lambda light chain ratio: 119.21 — ABNORMAL HIGH (ref 0.26–1.65)
Lambda free light chains: 2.8 mg/L — ABNORMAL LOW (ref 5.7–26.3)

## 2020-02-05 LAB — BETA 2 MICROGLOBULIN, SERUM: Beta-2 Microglobulin: 2.1 mg/L (ref 0.6–2.4)

## 2020-02-06 ENCOUNTER — Telehealth: Payer: Self-pay

## 2020-02-06 ENCOUNTER — Other Ambulatory Visit: Payer: Self-pay

## 2020-02-06 DIAGNOSIS — R778 Other specified abnormalities of plasma proteins: Secondary | ICD-10-CM

## 2020-02-06 LAB — MULTIPLE MYELOMA PANEL, SERUM
Albumin SerPl Elph-Mcnc: 3.5 g/dL (ref 2.9–4.4)
Albumin/Glob SerPl: 0.6 — ABNORMAL LOW (ref 0.7–1.7)
Alpha 1: 0.3 g/dL (ref 0.0–0.4)
Alpha2 Glob SerPl Elph-Mcnc: 0.8 g/dL (ref 0.4–1.0)
B-Globulin SerPl Elph-Mcnc: 4.6 g/dL — ABNORMAL HIGH (ref 0.7–1.3)
Gamma Glob SerPl Elph-Mcnc: 0.2 g/dL — ABNORMAL LOW (ref 0.4–1.8)
Globulin, Total: 5.9 g/dL — ABNORMAL HIGH (ref 2.2–3.9)
IgA: 45 mg/dL — ABNORMAL LOW (ref 61–437)
IgG (Immunoglobin G), Serum: 4002 mg/dL — ABNORMAL HIGH (ref 603–1613)
IgM (Immunoglobulin M), Srm: <5 mg/dL — ABNORMAL LOW (ref 20–172)
M Protein SerPl Elph-Mcnc: 4.1 g/dL — ABNORMAL HIGH
Total Protein ELP: 9.4 g/dL — ABNORMAL HIGH (ref 6.0–8.5)

## 2020-02-06 NOTE — Telephone Encounter (Signed)
Request for bone marrow biopsy faxed to centralized scheduling. Will schedule for MD follow up 2 weeks after biopsy.

## 2020-02-06 NOTE — Telephone Encounter (Signed)
-----  Message from Earlie Server, MD sent at 02/05/2020  9:25 PM EDT ----- Please arrange patient to have stat bone marrow biopsy ASAP.  Reason is abnormal SPEP, suspect multiple myeloma.  Next available appointment,

## 2020-02-10 ENCOUNTER — Telehealth: Payer: Self-pay

## 2020-02-10 NOTE — Progress Notes (Signed)
Patient on schedule for BMB 02/12/2020, called and spoke with patient on phone with pre procedure instructions given. Made aware to be here @ 0730, NPO after MN as well as driver for home post procedure/discharge. Stated understanding.

## 2020-02-10 NOTE — Telephone Encounter (Signed)
Done.. Pt has been sched to RTC on 02/27/20 @10 :45

## 2020-02-10 NOTE — Telephone Encounter (Signed)
patient scheduled for bone marrow biospy on 11/3 @ 8:30 a, arrive 7:30a. Please scheudle MD follow up 2 weeks after. I will call pt with appt details.

## 2020-02-10 NOTE — Telephone Encounter (Signed)
Pt was unavailabe due to being at work. Gave appt information to patient's wife, [name]. She wrote them down will relay appts to patient.

## 2020-02-11 ENCOUNTER — Other Ambulatory Visit: Payer: Self-pay | Admitting: Radiology

## 2020-02-12 ENCOUNTER — Telehealth: Payer: Self-pay

## 2020-02-12 ENCOUNTER — Other Ambulatory Visit: Payer: Self-pay

## 2020-02-12 ENCOUNTER — Other Ambulatory Visit: Payer: Self-pay | Admitting: Oncology

## 2020-02-12 ENCOUNTER — Ambulatory Visit
Admission: RE | Admit: 2020-02-12 | Discharge: 2020-02-12 | Disposition: A | Discharge status: Home / Self Care | Payer: Medicare Other | Source: Ambulatory Visit | Attending: Oncology | Admitting: Oncology

## 2020-02-12 DIAGNOSIS — M549 Dorsalgia, unspecified: Secondary | ICD-10-CM | POA: Diagnosis not present

## 2020-02-12 DIAGNOSIS — K5900 Constipation, unspecified: Secondary | ICD-10-CM | POA: Insufficient documentation

## 2020-02-12 DIAGNOSIS — D649 Anemia, unspecified: Secondary | ICD-10-CM | POA: Diagnosis not present

## 2020-02-12 DIAGNOSIS — F17210 Nicotine dependence, cigarettes, uncomplicated: Secondary | ICD-10-CM | POA: Diagnosis not present

## 2020-02-12 DIAGNOSIS — C9000 Multiple myeloma not having achieved remission: Secondary | ICD-10-CM | POA: Diagnosis not present

## 2020-02-12 DIAGNOSIS — C7951 Secondary malignant neoplasm of bone: Secondary | ICD-10-CM | POA: Insufficient documentation

## 2020-02-12 DIAGNOSIS — R778 Other specified abnormalities of plasma proteins: Secondary | ICD-10-CM

## 2020-02-12 DIAGNOSIS — R918 Other nonspecific abnormal finding of lung field: Secondary | ICD-10-CM | POA: Insufficient documentation

## 2020-02-12 DIAGNOSIS — M899 Disorder of bone, unspecified: Secondary | ICD-10-CM | POA: Diagnosis present

## 2020-02-12 LAB — PROTIME-INR
INR: 1.1 (ref 0.8–1.2)
Prothrombin Time: 14 seconds (ref 11.4–15.2)

## 2020-02-12 LAB — CBC WITH DIFFERENTIAL/PLATELET
Abs Immature Granulocytes: 0.02 10*3/uL (ref 0.00–0.07)
Basophils Absolute: 0 10*3/uL (ref 0.0–0.1)
Basophils Relative: 0 %
Eosinophils Absolute: 0.1 10*3/uL (ref 0.0–0.5)
Eosinophils Relative: 1 %
HCT: 37 % — ABNORMAL LOW (ref 39.0–52.0)
Hemoglobin: 12.1 g/dL — ABNORMAL LOW (ref 13.0–17.0)
Immature Granulocytes: 0 %
Lymphocytes Relative: 31 %
Lymphs Abs: 2.1 10*3/uL (ref 0.7–4.0)
MCH: 29.5 pg (ref 26.0–34.0)
MCHC: 32.7 g/dL (ref 30.0–36.0)
MCV: 90.2 fL (ref 80.0–100.0)
Monocytes Absolute: 0.6 10*3/uL (ref 0.1–1.0)
Monocytes Relative: 10 %
Neutro Abs: 3.8 10*3/uL (ref 1.7–7.7)
Neutrophils Relative %: 58 %
Platelets: 198 10*3/uL (ref 150–400)
RBC: 4.1 MIL/uL — ABNORMAL LOW (ref 4.22–5.81)
RDW: 15.7 % — ABNORMAL HIGH (ref 11.5–15.5)
WBC: 6.7 10*3/uL (ref 4.0–10.5)
nRBC: 0 % (ref 0.0–0.2)

## 2020-02-12 MED ORDER — FENTANYL CITRATE (PF) 100 MCG/2ML IJ SOLN
INTRAMUSCULAR | Status: AC | PRN
Start: 2020-02-12 — End: 2020-02-12
  Administered 2020-02-12 (×2): 50 ug via INTRAVENOUS

## 2020-02-12 MED ORDER — SODIUM CHLORIDE 0.9 % IV SOLN: INTRAVENOUS | Status: DC

## 2020-02-12 MED ORDER — HYDROCODONE-ACETAMINOPHEN 5-325 MG PO TABS: 1.0000 | ORAL_TABLET | Freq: Four times a day (QID) | ORAL | 0 refills | Status: DC | PRN

## 2020-02-12 MED ORDER — HEPARIN SOD (PORK) LOCK FLUSH 100 UNIT/ML IV SOLN
INTRAVENOUS | Status: AC
  Filled 2020-02-12: qty 5

## 2020-02-12 MED ORDER — MIDAZOLAM HCL 2 MG/2ML IJ SOLN
INTRAMUSCULAR | Status: AC | PRN
  Administered 2020-02-12 (×2): 1 mg via INTRAVENOUS

## 2020-02-12 MED ORDER — MIDAZOLAM HCL 2 MG/2ML IJ SOLN
INTRAMUSCULAR | Status: AC
  Filled 2020-02-12: qty 2

## 2020-02-12 MED ORDER — FENTANYL CITRATE (PF) 100 MCG/2ML IJ SOLN
INTRAMUSCULAR | Status: AC
  Filled 2020-02-12: qty 2

## 2020-02-12 NOTE — Consult Note (Signed)
Chief Complaint: Patient was seen in consultation today for CT guided bone marrow biopsy at the request of Yu,Zhou  Referring Physician(s): Yu,Zhou  Patient Status: [facility] - Out-pt  History of Present Illness: Tommy Larmon. is a 64 y.o. male with history of abnormal serum protein electrophoresis.  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  Patient has increased calcium level.  CT of the chest, abdomen and pelvis demonstrated small pulmonary nodules and scattered lytic bone lesions.  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  Request for CT-guided bone marrow biopsy.  Patient has been having significant back pain since September.  At rest, the back pain is approximately 4 out of 10.  He said sometimes the pain is 10 out of 10 when he is coughing or straining.  He is only taking Tylenol for his pain and this is not sufficient at this point.  He plans to contact the oncologist about additional pain medication.  He has constipation and uses laxative.  Patient has been vaccinated for COVID-19.  He denies breathing problems or chest pain.  He denies fevers or chills.  Past Medical History:  Diagnosis Date  . Depression   . Hyperlipemia   . Hypertension   . Hypogonadism in male     Past Surgical History:  Procedure Laterality Date  . COLONOSCOPY  07/14/2008    Allergies: Patient has no known allergies.  Medications: Prior to Admission medications   Medication Sig Start Date End Date Taking? Authorizing Provider  amLODipine (NORVASC) 10 MG tablet Take by mouth. 05/31/19 05/30/20 Yes [provider]  atorvastatin (LIPITOR) 40 MG tablet Take 40 mg by mouth daily.   Yes [provider]  buPROPion (WELLBUTRIN XL) 150 MG 24 hr tablet Take by mouth. 01/07/20 01/06/21 Yes [provider]  dutasteride (AVODART) 0.5 MG capsule Take by mouth. 11/20/19 11/19/20 Yes [provider]  losartan-hydrochlorothiazide  (HYZAAR) 100-12.5 MG tablet Take 1 tablet by mouth daily. 01/22/20  Yes [provider]  Psyllium (METAMUCIL MULTIHEALTH FIBER PO) Take by mouth daily.   Yes [provider]  ibuprofen (ADVIL) 200 MG tablet Take 200 mg by mouth every 6 (six) hours as needed. Patient not taking: Reported on 02/12/2020    [provider]     Family History  Problem Relation Age of Onset  . Skin cancer Mother   . Prostate cancer Father     Social History   Socioeconomic History  . Marital status: Married    Spouse name: Not on file  . Number of children: Not on file  . Years of education: Not on file  . Highest education level: Not on file  Occupational History  . Not on file  Tobacco Use  . Smoking status: Current Every Day Smoker    Packs/day: 1.00    Years: 30.00    Pack years: 30.00    Types: Cigarettes  . Smokeless tobacco: Never Used  Substance and Sexual Activity  . Alcohol use: Yes    Comment: occcassional  . Drug use: No  . Sexual activity: Not on file  Other Topics Concern  . Not on file  Social History Narrative  . Not on file   Social Determinants of Health   Financial Resource Strain:   . Difficulty of Paying Living Expenses: Not on file  Food Insecurity:   . Worried About Charity fundraiser in the Last Year: Not on file  . Ran Out of Food in the  Last Year: Not on file  Transportation Needs:   . Lack of Transportation (Medical): Not on file  . Lack of Transportation (Non-Medical): Not on file  Physical Activity:   . Days of Exercise per Week: Not on file  . Minutes of Exercise per Session: Not on file  Stress:   . Feeling of Stress : Not on file  Social Connections:   . Frequency of Communication with Friends and Family: Not on file  . Frequency of Social Gatherings with Friends and Family: Not on file  . Attends Religious Services: Not on file  . Active Member of Clubs or Organizations: Not on file  . Attends Archivist  Meetings: Not on file  . Marital Status: Not on file      Review of Systems  Constitutional: Negative for chills and fever.  Respiratory: Negative.   Cardiovascular: Negative.   Gastrointestinal: Positive for constipation.  Genitourinary: Negative.   Musculoskeletal: Positive for back pain.    Vital Signs: BP 128/87   Pulse 77   Temp 98.7 F (37.1 C) (Oral)   Resp 16   Ht _0  (1.88 m)   Wt 122.5 kg   SpO2 98%   BMI 34.67 kg/m   Physical Exam Constitutional:      Appearance: Normal appearance.  Cardiovascular:     Rate and Rhythm: Normal rate and regular rhythm.     Heart sounds: Normal heart sounds.  Pulmonary:     Effort: Pulmonary effort is normal.  Abdominal:     General: Abdomen is flat. Bowel sounds are normal.     Palpations: Abdomen is soft.  Neurological:     Mental Status: He is alert.     Imaging: CT CHEST W CONTRAST  Result Date: 01/30/2020 CLINICAL DATA:  64 year old male with right flank pain. Right lower lobe pulmonary nodule. EXAM: CT CHEST, ABDOMEN, AND PELVIS WITH CONTRAST TECHNIQUE: Multidetector CT imaging of the chest, abdomen and pelvis was performed following the standard protocol during bolus administration of intravenous contrast. CONTRAST:  178m OMNIPAQUE IOHEXOL 300 MG/ML  SOLN COMPARISON:  None. FINDINGS: CT CHEST FINDINGS Cardiovascular: There is no cardiomegaly or pericardial effusion. The thoracic aorta is unremarkable. The origins of the great vessels of the aortic arch appear patent as visualized. The central pulmonary arteries are patent. Mediastinum/Nodes: There is no hilar or mediastinal adenopathy. The esophagus and the thyroid gland are grossly unremarkable. No mediastinal fluid collection. Lungs/Pleura: There are bibasilar linear atelectasis/scarring. Multiple pulmonary nodules predominantly involving the lower lungs including a 10 mm right middle lobe nodule (98/3), a 6 mm right lower lobe subpleural nodule (93/3), a 4 mm right  middle lobe nodule (80/3), a 4 mm lingular nodule (67/3), and a 3 mm left lower lobe subpleural nodule (114/3). No focal consolidation, pleural effusion, or pneumothorax. The central airways are patent. Musculoskeletal: Old healed right anterior sixth rib fracture. There is a small indeterminate lucency in the anterior aspect of the right sixth rib anterior to the old fracture (93/3) which is not evaluated on this CT. Clinical correlation and attention on follow-up recommended. If there is history of known primary cancer or other concern for metastatic disease, bone scan may provide better evaluation. Several additional faint lucency is noted in the sternum. There is compression fracture of T9 with approximately 40% loss of vertebral body height, age indeterminate. Correlation with clinical exam and point tenderness recommended. There is mild perivertebral edema at this level and therefore an acute or subacute fracture is a  possibility. No retropulsed fragment. CT ABDOMEN PELVIS FINDINGS No intra-abdominal free air or free fluid. Hepatobiliary: The liver is unremarkable. No intrahepatic biliary ductal dilatation. The gallbladder is unremarkable. Pancreas: Unremarkable. No pancreatic ductal dilatation or surrounding inflammatory changes. Spleen: Normal in size without focal abnormality. Adrenals/Urinary Tract: The adrenal glands are unremarkable. There is no hydronephrosis on either side. There is symmetric enhancement and excretion of contrast by both kidneys. The visualized ureters and urinary bladder appear unremarkable. Stomach/Bowel: There is apparent focal area of thickening at the gastroesophageal junction (51/2 and coronal 86/4). This may be related to underdistention, however underlying lesion is not excluded. Further evaluation with upper GI study or direct visualization with endoscopy is recommended. There is no bowel obstruction or active inflammation. The appendix is normal. Vascular/Lymphatic: Mild  aortoiliac atherosclerotic disease. The IVC is unremarkable. No portal venous gas. There is no adenopathy. Reproductive: The prostate and seminal vesicles are grossly unremarkable. No pelvic mass. Other: None Musculoskeletal: Osteopenia with degenerative changes of the spine. There is a 3.4 x 2.5 cm lytic lesion in the left pubic bone as well as a smaller lytic lesion in the right pubic bone. Additional faint subcentimeter lucencies may be present within the spine. Age indeterminate, old-appearing compression fracture of the inferior endplate of L4. No acute osseous pathology. IMPRESSION: 1. Multiple bilateral pulmonary nodules measuring up to 10 mm in the right middle lobe. These nodules are indeterminate and may be inflammatory/infectious in etiology. However, metastatic disease is not excluded in light of osseous findings. Clinical correlation and multidisciplinary consult advised. 2. Multiple osseous lytic lesions with the largest involving the left pubic bone concerning for metastatic disease. Further evaluation with bone scan recommended. 3. Compression fracture of T9 with approximately 40% loss of vertebral body height, age indeterminate. Correlation with clinical exam and point tenderness recommended. 4. Apparent focal area of thickening at the gastroesophageal junction. Further evaluation with upper GI study or direct visualization with endoscopy is recommended. 5. No bowel obstruction. Normal appendix. 6. Aortic Atherosclerosis (ICD10-I70.0). Electronically Signed   By: Anner Crete M.D.   On: 01/30/2020 20:06   CT ABDOMEN PELVIS W CONTRAST  Result Date: 01/30/2020 CLINICAL DATA:  64 year old male with right flank pain. Right lower lobe pulmonary nodule. EXAM: CT CHEST, ABDOMEN, AND PELVIS WITH CONTRAST TECHNIQUE: Multidetector CT imaging of the chest, abdomen and pelvis was performed following the standard protocol during bolus administration of intravenous contrast. CONTRAST:  161m OMNIPAQUE  IOHEXOL 300 MG/ML  SOLN COMPARISON:  None. FINDINGS: CT CHEST FINDINGS Cardiovascular: There is no cardiomegaly or pericardial effusion. The thoracic aorta is unremarkable. The origins of the great vessels of the aortic arch appear patent as visualized. The central pulmonary arteries are patent. Mediastinum/Nodes: There is no hilar or mediastinal adenopathy. The esophagus and the thyroid gland are grossly unremarkable. No mediastinal fluid collection. Lungs/Pleura: There are bibasilar linear atelectasis/scarring. Multiple pulmonary nodules predominantly involving the lower lungs including a 10 mm right middle lobe nodule (98/3), a 6 mm right lower lobe subpleural nodule (93/3), a 4 mm right middle lobe nodule (80/3), a 4 mm lingular nodule (67/3), and a 3 mm left lower lobe subpleural nodule (114/3). No focal consolidation, pleural effusion, or pneumothorax. The central airways are patent. Musculoskeletal: Old healed right anterior sixth rib fracture. There is a small indeterminate lucency in the anterior aspect of the right sixth rib anterior to the old fracture (93/3) which is not evaluated on this CT. Clinical correlation and attention on follow-up recommended. If there is history  of known primary cancer or other concern for metastatic disease, bone scan may provide better evaluation. Several additional faint lucency is noted in the sternum. There is compression fracture of T9 with approximately 40% loss of vertebral body height, age indeterminate. Correlation with clinical exam and point tenderness recommended. There is mild perivertebral edema at this level and therefore an acute or subacute fracture is a possibility. No retropulsed fragment. CT ABDOMEN PELVIS FINDINGS No intra-abdominal free air or free fluid. Hepatobiliary: The liver is unremarkable. No intrahepatic biliary ductal dilatation. The gallbladder is unremarkable. Pancreas: Unremarkable. No pancreatic ductal dilatation or surrounding inflammatory  changes. Spleen: Normal in size without focal abnormality. Adrenals/Urinary Tract: The adrenal glands are unremarkable. There is no hydronephrosis on either side. There is symmetric enhancement and excretion of contrast by both kidneys. The visualized ureters and urinary bladder appear unremarkable. Stomach/Bowel: There is apparent focal area of thickening at the gastroesophageal junction (51/2 and coronal 86/4). This may be related to underdistention, however underlying lesion is not excluded. Further evaluation with upper GI study or direct visualization with endoscopy is recommended. There is no bowel obstruction or active inflammation. The appendix is normal. Vascular/Lymphatic: Mild aortoiliac atherosclerotic disease. The IVC is unremarkable. No portal venous gas. There is no adenopathy. Reproductive: The prostate and seminal vesicles are grossly unremarkable. No pelvic mass. Other: None Musculoskeletal: Osteopenia with degenerative changes of the spine. There is a 3.4 x 2.5 cm lytic lesion in the left pubic bone as well as a smaller lytic lesion in the right pubic bone. Additional faint subcentimeter lucencies may be present within the spine. Age indeterminate, old-appearing compression fracture of the inferior endplate of L4. No acute osseous pathology. IMPRESSION: 1. Multiple bilateral pulmonary nodules measuring up to 10 mm in the right middle lobe. These nodules are indeterminate and may be inflammatory/infectious in etiology. However, metastatic disease is not excluded in light of osseous findings. Clinical correlation and multidisciplinary consult advised. 2. Multiple osseous lytic lesions with the largest involving the left pubic bone concerning for metastatic disease. Further evaluation with bone scan recommended. 3. Compression fracture of T9 with approximately 40% loss of vertebral body height, age indeterminate. Correlation with clinical exam and point tenderness recommended. 4. Apparent focal area  of thickening at the gastroesophageal junction. Further evaluation with upper GI study or direct visualization with endoscopy is recommended. 5. No bowel obstruction. Normal appendix. 6. Aortic Atherosclerosis (ICD10-I70.0). Electronically Signed   By: Anner Crete M.D.   On: 01/30/2020 20:06    Labs:  CBC: Recent Labs    02/04/20 1029 02/12/20 0805  WBC 4.1 6.7  HGB 11.9* 12.1*  HCT 35.6* 37.0*  PLT 174 198    COAGS: Recent Labs    02/12/20 0805  INR 1.1    BMP: Recent Labs    02/04/20 1029  NA 136  K 3.6  CL 100  CO2 27  GLUCOSE 89  BUN 13  CALCIUM 11.5*  CREATININE 1.01  GFRNONAA >60    LIVER FUNCTION TESTS: Recent Labs    02/04/20 1029  BILITOT 0.7  AST 22  ALT 24  ALKPHOS 40  PROT 9.8*  ALBUMIN 2.7*    TUMOR MARKERS: No results for input(s): AFPTM, CEA, CA199, CHROMGRNA in the last 8760 hours.  Assessment and Plan:  64 year old with abnormal serum protein electrophoresis and lytic bone lesions.  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  Risks and benefits of CT-guided bone marrow biopsy was discussed  with the patient and/or patient's family including, but not limited to bleeding, infection, damage to adjacent structures or low yield requiring additional tests.  All of the questions were answered and there is agreement to proceed.  Consent signed and in chart.   Thank you for this interesting consult.  I greatly enjoyed meeting [employer]. and look forward to participating in their care.  A copy of this report was sent to the requesting provider on this date.  Electronically Signed: Burman Riis, MD 02/12/2020, 8:32 AM   I spent a total of  10 minutes  in face to face in clinical consultation, greater than 50% of which was counseling/coordinating care for CT-guided bone marrow biopsy.

## 2020-02-12 NOTE — Discharge Instructions (Signed)
Bone Marrow Aspiration and Bone Marrow Biopsy, Adult, Care After This sheet gives you information about how to care for yourself after your procedure. Your health care provider may also give you more specific instructions. If you have problems or questions, contact your health care provider. What can I expect after the procedure? After the procedure, it is common to have:  Mild pain and tenderness.  Swelling.  Bruising. Follow these instructions at home: Puncture site care   Follow instructions from your health care provider about how to take care of the puncture site. Make sure you: ? Wash your hands with soap and water before and after you change your bandage (dressing). If soap and water are not available, use hand sanitizer. ? Change your dressing as told by your health care provider.  Check your puncture site every day for signs of infection. Check for: ? More redness, swelling, or pain. ? Fluid or blood. ? Warmth. ? Pus or a bad smell. Activity  Return to your normal activities as told by your health care provider. Ask your health care provider what activities are safe for you.  Do not lift anything that is heavier than 10 lb (4.5 kg), or the limit that you are told, until your health care provider says that it is safe.  Do not drive for 24 hours if you were given a sedative during your procedure. General instructions   Take over-the-counter and prescription medicines only as told by your health care provider.  Do not take baths, swim, or use a hot tub until your health care provider approves. Ask your health care provider if you may take showers. You may only be allowed to take sponge baths.  If directed, put ice on the affected area. To do this: ? Put ice in a plastic bag. ? Place a towel between your skin and the bag. ? Leave the ice on for 20 minutes, 2-3 times a day.  Keep all follow-up visits as told by your health care provider. This is important. Contact a  health care provider if:  Your pain is not controlled with medicine.  You have a fever.  You have more redness, swelling, or pain around the puncture site.  You have fluid or blood coming from the puncture site.  Your puncture site feels warm to the touch.  You have pus or a bad smell coming from the puncture site. Summary  After the procedure, it is common to have mild pain, tenderness, swelling, and bruising.  Follow instructions from your health care provider about how to take care of the puncture site and what activities are safe for you.  Take over-the-counter and prescription medicines only as told by your health care provider.  Contact a health care provider if you have any signs of infection, such as fluid or blood coming from the puncture site. This information is not intended to replace advice given to you by your health care provider. Make sure you discuss any questions you have with your health care provider. Document Revised: 08/14/2018 Document Reviewed: 08/14/2018 Elsevier Patient Education  [place]. Moderate Conscious Sedation, Adult, Care After These instructions provide you with information about caring for yourself after your procedure. Your health care provider may also give you more specific instructions. Your treatment has been planned according to current medical practices, but problems sometimes occur. Call your health care provider if you have any problems or questions after your procedure. What can I expect after the procedure? After your procedure,  it is common:  To feel sleepy for several hours.  To feel clumsy and have poor balance for several hours.  To have poor judgment for several hours.  To vomit if you eat too soon. Follow these instructions at home: For at least 24 hours after the procedure:   Do not: ? Participate in activities where you could fall or become injured. ? Drive. ? Use heavy machinery. ? Drink alcohol. ? Take  sleeping pills or medicines that cause drowsiness. ? Make important decisions or sign legal documents. ? Take care of children on your own.  Rest. Eating and drinking  Follow the diet recommended by your health care provider.  If you vomit: ? Drink water, juice, or soup when you can drink without vomiting. ? Make sure you have little or no nausea before eating solid foods. General instructions  Have a responsible adult stay with you until you are awake and alert.  Take over-the-counter and prescription medicines only as told by your health care provider.  If you smoke, do not smoke without supervision.  Keep all follow-up visits as told by your health care provider. This is important. Contact a health care provider if:  You keep feeling nauseous or you keep vomiting.  You feel light-headed.  You develop a rash.  You have a fever. Get help right away if:  You have trouble breathing. This information is not intended to replace advice given to you by your health care provider. Make sure you discuss any questions you have with your health care provider. Document Revised: 03/10/2017 Document Reviewed: 07/18/2015 Elsevier Patient Education  [place].   Needle Biopsy, Care After These instructions tell you how to care for yourself after your procedure. Your doctor may also give you more specific instructions. Call your doctor if you have any problems or questions. What can I expect after the procedure? After the procedure, it is common to have:  Soreness.  Bruising.  Mild pain. Follow these instructions at home:   Return to your normal activities as told by your doctor. Ask your doctor what activities are safe for you.  Take over-the-counter and prescription medicines only as told by your doctor.  Wash your hands with soap and water before you change your bandage (dressing). If you cannot use soap and water, use hand sanitizer.  Follow instructions from  your doctor about: ? How to take care of your puncture site. ? When and how to change your bandage. ? When to remove your bandage.  Check your puncture site every day for signs of infection. Watch for: ? Redness, swelling, or pain. ? Fluid or blood. ? Pus or a bad smell. ? Warmth.  Do not take baths, swim, or use a hot tub until your doctor approves. Ask your doctor if you may take showers. You may only be allowed to take sponge baths.  Keep all follow-up visits as told by your doctor. This is important. Contact a doctor if you have:  A fever.  Redness, swelling, or pain at the puncture site, and it lasts longer than a few days.  Fluid, blood, or pus coming from the puncture site.  Warmth coming from the puncture site. Get help right away if:  You have a lot of bleeding from the puncture site. Summary  After the procedure, it is common to have soreness, bruising, or mild pain at the puncture site.  Check your puncture site every day for signs of infection, such as redness, swelling, or  pain.  Get help right away if you have severe bleeding from your puncture site. This information is not intended to replace advice given to you by your health care provider. Make sure you discuss any questions you have with your health care provider. Document Revised: 04/10/2017 Document Reviewed: 04/10/2017 Elsevier Patient Education  2020 [employer].

## 2020-02-12 NOTE — Telephone Encounter (Signed)
Pt came to cancer center with concerns regarding ongoing pain. Pt reports that he has had pain to back that radiates to front of abdomen and in bones for about 6 weeks. He states that he asked Dr. Tasia Catchings for pain medicaiton at previous visit, but since he was a new patient she wasn't sure where the anomality was coming from. Pt had bone marrow biopsy this mornig and now wants to know if Dr. Tasia Catchings will prescribe something for pain since he's had this done.   Dr. Tasia Catchings notified and sent pain medicaiton (norco) to [facility] in [place]. Pt informed.

## 2020-02-12 NOTE — Procedures (Signed)
Interventional Radiology Procedure:   Indications: Lytic bone lesions and abnormal serum protein electrophoresis  Procedure: CT guided bone marrow biopsy  Findings: 2 aspirates and 1 core from right ilium  Complications: None     EBL: Minimal, less than 10 ml  Plan: Discharge to home in one hour.   Tameeka Luo R. Anselm Pancoast, MD  Pager: [phone]

## 2020-02-13 ENCOUNTER — Other Ambulatory Visit: Payer: Self-pay | Admitting: Oncology

## 2020-02-13 ENCOUNTER — Telehealth: Payer: Self-pay

## 2020-02-13 MED ORDER — DEXAMETHASONE 4 MG PO TABS: 40.0000 mg | ORAL_TABLET | Freq: Once | ORAL | 0 refills | Status: AC

## 2020-02-13 NOTE — Telephone Encounter (Signed)
Result note message received from Dr. Tasia Catchings:  I can not reach him. I have sent pain medication to his pharmacy. Please follow up on his pain. I have also sent him a Rx of steroid, Dexamethasone 40mg  oral x 1. (he take all 10 tables of 4mg ) and hopefully that will help his pain.  -I talked to pathology, I will further discuss with him when result is finalized. Steroid can help both pain and possible myeloma.  -encourage oral hydration.   Patient picked up the pain medication yesterday.  He received a message from pharmacy that he had another rx for pick up, which was the Dexamethasone, took the Dex dose today.  Reports his pain is better controlled with the pain medication.

## 2020-02-16 ENCOUNTER — Encounter: Payer: Self-pay | Admitting: Oncology

## 2020-02-16 DIAGNOSIS — Z7189 Other specified counseling: Secondary | ICD-10-CM | POA: Insufficient documentation

## 2020-02-18 ENCOUNTER — Telehealth: Payer: Self-pay

## 2020-02-18 ENCOUNTER — Telehealth: Payer: Self-pay | Admitting: *Deleted

## 2020-02-18 NOTE — Telephone Encounter (Signed)
-----   Message from Earlie Server, MD sent at 02/17/2020 10:20 PM EST ----- Bone marrow has resulted. Please move his appt up to this week, MD only.  ASAP. Thanks.

## 2020-02-18 NOTE — Telephone Encounter (Signed)
We need patient's permission to add her on the list of release of information.

## 2020-02-18 NOTE — Telephone Encounter (Signed)
Please schedule patient to see MD this week as requested and inform patient of appt detials.

## 2020-02-18 NOTE — Telephone Encounter (Addendum)
Patient's daughter Kellie Moor who is not on his Release Of Information called asking for him to be referred to PT to help him get up and down.She also is asking that doctor talk to him regarding a  Psychiatry referral for depression and does not want the patient to know that she has called [place]. She is also requesting a return call [phone].

## 2020-02-18 NOTE — Telephone Encounter (Signed)
Done.. Pts 02/27/20 has been moved to Friday 02/21/20 @ 9:15 Pt was made aware

## 2020-02-20 ENCOUNTER — Encounter (HOSPITAL_COMMUNITY): Payer: Self-pay | Admitting: Oncology

## 2020-02-21 ENCOUNTER — Other Ambulatory Visit: Payer: Self-pay

## 2020-02-21 ENCOUNTER — Encounter (HOSPITAL_COMMUNITY): Payer: Self-pay | Admitting: Oncology

## 2020-02-21 ENCOUNTER — Encounter: Payer: Self-pay | Admitting: Oncology

## 2020-02-21 ENCOUNTER — Inpatient Hospital Stay: Payer: Medicare Other

## 2020-02-21 ENCOUNTER — Inpatient Hospital Stay: Payer: Medicare Other | Attending: Oncology | Admitting: Oncology

## 2020-02-21 VITALS — BP 133/87 | HR 79 | Temp 99.6°F | Resp 18 | Wt 258.9 lb

## 2020-02-21 DIAGNOSIS — C9000 Multiple myeloma not having achieved remission: Secondary | ICD-10-CM | POA: Insufficient documentation

## 2020-02-21 DIAGNOSIS — S22070A Wedge compression fracture of T9-T10 vertebra, initial encounter for closed fracture: Secondary | ICD-10-CM

## 2020-02-21 DIAGNOSIS — K5903 Drug induced constipation: Secondary | ICD-10-CM | POA: Diagnosis not present

## 2020-02-21 DIAGNOSIS — G893 Neoplasm related pain (acute) (chronic): Secondary | ICD-10-CM | POA: Insufficient documentation

## 2020-02-21 DIAGNOSIS — Z79899 Other long term (current) drug therapy: Secondary | ICD-10-CM | POA: Insufficient documentation

## 2020-02-21 DIAGNOSIS — I700 Atherosclerosis of aorta: Secondary | ICD-10-CM | POA: Insufficient documentation

## 2020-02-21 DIAGNOSIS — M50222 Other cervical disc displacement at C5-C6 level: Secondary | ICD-10-CM | POA: Insufficient documentation

## 2020-02-21 DIAGNOSIS — F329 Major depressive disorder, single episode, unspecified: Secondary | ICD-10-CM | POA: Insufficient documentation

## 2020-02-21 DIAGNOSIS — M47819 Spondylosis without myelopathy or radiculopathy, site unspecified: Secondary | ICD-10-CM | POA: Insufficient documentation

## 2020-02-21 DIAGNOSIS — Z7982 Long term (current) use of aspirin: Secondary | ICD-10-CM | POA: Insufficient documentation

## 2020-02-21 DIAGNOSIS — F32A Depression, unspecified: Secondary | ICD-10-CM | POA: Diagnosis not present

## 2020-02-21 DIAGNOSIS — M899 Disorder of bone, unspecified: Secondary | ICD-10-CM

## 2020-02-21 DIAGNOSIS — R918 Other nonspecific abnormal finding of lung field: Secondary | ICD-10-CM | POA: Insufficient documentation

## 2020-02-21 DIAGNOSIS — M549 Dorsalgia, unspecified: Secondary | ICD-10-CM | POA: Insufficient documentation

## 2020-02-21 DIAGNOSIS — M4854XA Collapsed vertebra, not elsewhere classified, thoracic region, initial encounter for fracture: Secondary | ICD-10-CM | POA: Diagnosis not present

## 2020-02-21 DIAGNOSIS — F17210 Nicotine dependence, cigarettes, uncomplicated: Secondary | ICD-10-CM | POA: Diagnosis not present

## 2020-02-21 DIAGNOSIS — E785 Hyperlipidemia, unspecified: Secondary | ICD-10-CM | POA: Insufficient documentation

## 2020-02-21 DIAGNOSIS — M8580 Other specified disorders of bone density and structure, unspecified site: Secondary | ICD-10-CM | POA: Insufficient documentation

## 2020-02-21 DIAGNOSIS — K5909 Other constipation: Secondary | ICD-10-CM | POA: Diagnosis not present

## 2020-02-21 DIAGNOSIS — I10 Essential (primary) hypertension: Secondary | ICD-10-CM | POA: Diagnosis not present

## 2020-02-21 DIAGNOSIS — D649 Anemia, unspecified: Secondary | ICD-10-CM | POA: Insufficient documentation

## 2020-02-21 DIAGNOSIS — M5126 Other intervertebral disc displacement, lumbar region: Secondary | ICD-10-CM | POA: Insufficient documentation

## 2020-02-21 DIAGNOSIS — Z7189 Other specified counseling: Secondary | ICD-10-CM

## 2020-02-21 LAB — CBC WITH DIFFERENTIAL/PLATELET
Abs Immature Granulocytes: 0.02 K/uL (ref 0.00–0.07)
Basophils Absolute: 0 K/uL (ref 0.0–0.1)
Basophils Relative: 0 %
Eosinophils Absolute: 0.1 K/uL (ref 0.0–0.5)
Eosinophils Relative: 2 %
HCT: 35.9 % — ABNORMAL LOW (ref 39.0–52.0)
Hemoglobin: 11.9 g/dL — ABNORMAL LOW (ref 13.0–17.0)
Immature Granulocytes: 0 %
Lymphocytes Relative: 46 %
Lymphs Abs: 2.1 K/uL (ref 0.7–4.0)
MCH: 30 pg (ref 26.0–34.0)
MCHC: 33.1 g/dL (ref 30.0–36.0)
MCV: 90.4 fL (ref 80.0–100.0)
Monocytes Absolute: 0.5 K/uL (ref 0.1–1.0)
Monocytes Relative: 12 %
Neutro Abs: 1.8 K/uL (ref 1.7–7.7)
Neutrophils Relative %: 40 %
Platelets: 192 K/uL (ref 150–400)
RBC: 3.97 MIL/uL — ABNORMAL LOW (ref 4.22–5.81)
RDW: 15.4 % (ref 11.5–15.5)
WBC: 4.6 K/uL (ref 4.0–10.5)
nRBC: 0 % (ref 0.0–0.2)

## 2020-02-21 LAB — COMPREHENSIVE METABOLIC PANEL
ALT: 21 U/L (ref 0–44)
AST: 15 U/L (ref 15–41)
Albumin: 2.7 g/dL — ABNORMAL LOW (ref 3.5–5.0)
Alkaline Phosphatase: 44 U/L (ref 38–126)
Anion gap: 10 (ref 5–15)
BUN: 13 mg/dL (ref 8–23)
CO2: 26 mmol/L (ref 22–32)
Calcium: 11.7 mg/dL — ABNORMAL HIGH (ref 8.9–10.3)
Chloride: 98 mmol/L (ref 98–111)
Creatinine, Ser: 0.98 mg/dL (ref 0.61–1.24)
GFR, Estimated: >60 mL/min (ref >60)
Glucose, Bld: 91 mg/dL (ref 70–99)
Potassium: 3.3 mmol/L — ABNORMAL LOW (ref 3.5–5.1)
Sodium: 134 mmol/L — ABNORMAL LOW (ref 135–145)
Total Bilirubin: 0.4 mg/dL (ref 0.3–1.2)
Total Protein: 9.9 g/dL — ABNORMAL HIGH (ref 6.5–8.1)

## 2020-02-21 LAB — SURGICAL PATHOLOGY

## 2020-02-21 MED ORDER — LENALIDOMIDE 25 MG PO CAPS: 25.0000 mg | ORAL_CAPSULE | Freq: Every day | ORAL | 0 refills | Status: DC

## 2020-02-21 MED ORDER — ACYCLOVIR 400 MG PO TABS: 400.0000 mg | ORAL_TABLET | Freq: Two times a day (BID) | ORAL | 3 refills | Status: DC

## 2020-02-21 MED ORDER — ONDANSETRON HCL 8 MG PO TABS: 8.0000 mg | ORAL_TABLET | Freq: Two times a day (BID) | ORAL | 1 refills | Status: DC | PRN

## 2020-02-21 MED ORDER — XTAMPZA ER 13.5 MG PO C12A: 13.5000 mg | EXTENDED_RELEASE_CAPSULE | Freq: Two times a day (BID) | ORAL | 0 refills | Status: DC

## 2020-02-21 MED ORDER — DEXAMETHASONE 4 MG PO TABS: 40.0000 mg | ORAL_TABLET | Freq: Once | ORAL | 0 refills | Status: AC

## 2020-02-21 MED ORDER — HYDROCODONE-ACETAMINOPHEN 5-325 MG PO TABS: 1.0000 | ORAL_TABLET | Freq: Four times a day (QID) | ORAL | 0 refills | Status: DC | PRN

## 2020-02-21 MED ORDER — SENNOSIDES-DOCUSATE SODIUM 8.6-50 MG PO TABS: 2.0000 | ORAL_TABLET | Freq: Every day | ORAL | 0 refills | Status: DC

## 2020-02-21 MED ORDER — GABAPENTIN 300 MG PO CAPS: 300.0000 mg | ORAL_CAPSULE | Freq: Every day | ORAL | 0 refills | Status: DC

## 2020-02-21 NOTE — Progress Notes (Signed)
START ON PATHWAY REGIMEN - Multiple Myeloma and Other Plasma Cell Dyscrasias     A cycle is every 21 days:     Bortezomib      Lenalidomide      Dexamethasone   **Always confirm dose/schedule in your pharmacy ordering system**  Patient Characteristics: Multiple Myeloma, Newly Diagnosed, Transplant Eligible, Standard Risk Disease Classification: Multiple Myeloma R-ISS Staging: II Therapeutic Status: Newly Diagnosed Is Patient Eligible for Transplant<= Transplant Eligible Risk Status: Standard Risk Intent of Therapy: Non-Curative / Palliative Intent, Discussed with Patient 

## 2020-02-21 NOTE — Progress Notes (Signed)
Patient here for oncology follow-up appointment, expresses concerns of constipation and Rx requests, MD aware.

## 2020-02-21 NOTE — Progress Notes (Signed)
Hematology/Oncology follow up note [facility] [phone] [phone]   Patient Care Team: Sofie Hartigan, MD as PCP - General (Family Medicine) Noreene Filbert, MD as Radiation Oncologist (Radiation Oncology)  REFERRING PROVIDER: [name], PA  CHIEF COMPLAINTS/REASON FOR VISIT:  Follow-up for multiple myeloma HISTORY OF PRESENTING ILLNESS:   Tommy Smith. is a  64 y.o.  male with PMH listed below was seen in consultation at the request of  [name], [place]  for evaluation of abnormal serum protein electrophoresis.  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  Increase calcium level at 12.3, PTH was normal.  Creatinine 1, estimated GFR 91, CBC showed hemoglobin 12.4, Patient also recently had a CT chest abdomen pelvis with contrast for evaluation of right flank pain and right lower lobe nodule. 01/29/2020, CT showed multiple bilateral pulmonary nodules measuring up to 10 mm in the right middle lobe, nodules are indeterminate, infectious/inflammatory etiology versus metastatic disease. Multiple osseous lytic lesions, largest lesion involving the left pubic bone concerning for metastatic disease. Compression fractures at T9, age indeterminate. Appearance focal area of thickening at the gastroesophageal junction.  Further evaluation with upper GI study or direct visualization with endoscopy is recommended No bowel obstruction. Aortic atherosclerosis.  Patient reports NSAIDs as needed for lower back pain.  Mid lower back pain usually is worse when when he stands up from sitting position.  Patient works in the [employer].  Lives at home with wife.  Denies any unintentional weight loss, fever, chills, night sweating  INTERVAL HISTORY Tommy Smith. is a 64 y.o. male who has above history reviewed by me today presents for follow up visit for  New diagnosis multiple myeloma Problems  and complaints are listed below: Patient had work-up done including bone marrow biopsy.  He presented discussed the results and management plan. He continues to have back pain.  He takes Norco 5/325 mg every 6 hours as needed with some pain relief. Also is constipated. Review of Systems  Constitutional: Negative for appetite change, chills, fatigue, fever and unexpected weight change.  HENT:   Negative for hearing loss and voice change.   Eyes: Negative for eye problems and icterus.  Respiratory: Negative for chest tightness, cough and shortness of breath.   Cardiovascular: Negative for chest pain and leg swelling.  Gastrointestinal: Negative for abdominal distention and abdominal pain.  Endocrine: Negative for hot flashes.  Genitourinary: Negative for difficulty urinating, dysuria and frequency.   Musculoskeletal: Positive for back pain. Negative for arthralgias.  Skin: Negative for itching and rash.  Neurological: Negative for light-headedness and numbness.  Hematological: Negative for adenopathy. Does not bruise/bleed easily.  Psychiatric/Behavioral: Negative for confusion.    MEDICAL HISTORY:  Past Medical History:  Diagnosis Date  . Depression   . Hyperlipemia   . Hypertension   . Hypogonadism in male     SURGICAL HISTORY: Past Surgical History:  Procedure Laterality Date  . COLONOSCOPY  07/14/2008    SOCIAL HISTORY: Social History   Socioeconomic History  . Marital status: Married    Spouse name: Not on file  . Number of children: Not on file  . Years of education: Not on file  . Highest education level: Not on file  Occupational History  . Not on file  Tobacco Use  . Smoking status: Current Every Day Smoker    Packs/day: 1.00    Years: 30.00    Pack years: 30.00    Types: Cigarettes  .  Smokeless tobacco: Never Used  Substance and Sexual Activity  . Alcohol use: Yes    Comment: occcassional  . Drug use: No  . Sexual activity: Not on file  Other Topics  Concern  . Not on file  Social History Narrative  . Not on file   Social Determinants of Health   Financial Resource Strain:   . Difficulty of Paying Living Expenses: Not on file  Food Insecurity:   . Worried About Charity fundraiser in the Last Year: Not on file  . Ran Out of Food in the Last Year: Not on file  Transportation Needs:   . Lack of Transportation (Medical): Not on file  . Lack of Transportation (Non-Medical): Not on file  Physical Activity:   . Days of Exercise per Week: Not on file  . Minutes of Exercise per Session: Not on file  Stress:   . Feeling of Stress : Not on file  Social Connections:   . Frequency of Communication with Friends and Family: Not on file  . Frequency of Social Gatherings with Friends and Family: Not on file  . Attends Religious Services: Not on file  . Active Member of Clubs or Organizations: Not on file  . Attends Archivist Meetings: Not on file  . Marital Status: Not on file  Intimate Partner Violence:   . Fear of Current or Ex-Partner: Not on file  . Emotionally Abused: Not on file  . Physically Abused: Not on file  . Sexually Abused: Not on file    FAMILY HISTORY: Family History  Problem Relation Age of Onset  . Skin cancer Mother   . Prostate cancer Father     ALLERGIES:  has No Known Allergies.  MEDICATIONS:  Current Outpatient Medications  Medication Sig Dispense Refill  . amLODipine (NORVASC) 10 MG tablet Take by mouth.    Marland Kitchen atorvastatin (LIPITOR) 40 MG tablet Take 40 mg by mouth daily.    Marland Kitchen buPROPion (WELLBUTRIN XL) 150 MG 24 hr tablet Take by mouth.    . dutasteride (AVODART) 0.5 MG capsule Take by mouth.    Marland Kitchen HYDROcodone-acetaminophen (NORCO) 5-325 MG tablet Take 1 tablet by mouth every 6 (six) hours as needed for moderate pain. 30 tablet 0  . losartan-hydrochlorothiazide (HYZAAR) 100-12.5 MG tablet Take 1 tablet by mouth daily.    . Psyllium (METAMUCIL MULTIHEALTH FIBER PO) Take by mouth daily.    Marland Kitchen  acyclovir (ZOVIRAX) 400 MG tablet Take 1 tablet (400 mg total) by mouth 2 (two) times daily. 60 tablet 3  . dexamethasone (DECADRON) 4 MG tablet Take 10 tablets (40 mg total) by mouth once for 1 dose. 10 tablet 0  . gabapentin (NEURONTIN) 300 MG capsule Take 1 capsule (300 mg total) by mouth daily. 30 capsule 0  . lenalidomide (REVLIMID) 25 MG capsule Take 1 capsule (25 mg total) by mouth daily. Take 14 days on, 7 days off, repeat every 21 days. 14 capsule 0  . ondansetron (ZOFRAN) 8 MG tablet Take 1 tablet (8 mg total) by mouth 2 (two) times daily as needed (Nausea or vomiting). 30 tablet 1  . oxyCODONE ER (XTAMPZA ER) 13.5 MG C12A Take 13.5 mg by mouth every 12 (twelve) hours. 30 capsule 0  . senna-docusate (SENOKOT-S) 8.6-50 MG tablet Take 2 tablets by mouth daily. 60 tablet 0   No current facility-administered medications for this visit.     PHYSICAL EXAMINATION: ECOG PERFORMANCE STATUS: 1 - Symptomatic but completely ambulatory Vitals:   02/21/20  0921  BP: 133/87  Pulse: 79  Resp: 18  Temp: 99.6 F (37.6 C)  SpO2: 100%   Filed Weights   02/21/20 0921  Weight: 258 lb 14.4 oz (117.4 kg)    Physical Exam Constitutional:      General: He is not in acute distress. HENT:     Head: Normocephalic and atraumatic.  Eyes:     General: No scleral icterus. Cardiovascular:     Rate and Rhythm: Normal rate and regular rhythm.     Heart sounds: Normal heart sounds.  Pulmonary:     Effort: Pulmonary effort is normal. No respiratory distress.     Breath sounds: No wheezing.  Abdominal:     General: Bowel sounds are normal. There is no distension.     Palpations: Abdomen is soft.  Musculoskeletal:        General: No deformity. Normal range of motion.     Cervical back: Normal range of motion and neck supple.  Skin:    General: Skin is warm and dry.     Findings: No erythema or rash.  Neurological:     Mental Status: He is alert and oriented to person, place, and time. Mental  status is at baseline.     Cranial Nerves: No cranial nerve deficit.     Coordination: Coordination normal.  Psychiatric:        Mood and Affect: Mood normal.     LABORATORY DATA:  I have reviewed the data as listed Lab Results  Component Value Date   WBC 4.6 02/21/2020   HGB 11.9 (L) 02/21/2020   HCT 35.9 (L) 02/21/2020   MCV 90.4 02/21/2020   PLT 192 02/21/2020   Recent Labs    02/04/20 1029 02/21/20 1037  NA 136 134*  K 3.6 3.3*  CL 100 98  CO2 27 26  GLUCOSE 89 91  BUN 13 13  CREATININE 1.01 0.98  CALCIUM 11.5* 11.7*  GFRNONAA >60 >60  PROT 9.8* 9.9*  ALBUMIN 2.7* 2.7*  AST 22 15  ALT 24 21  ALKPHOS 40 44  BILITOT 0.7 0.4   Iron/TIBC/Ferritin/ %Sat No results found for: IRON, TIBC, FERRITIN, IRONPCTSAT    RADIOGRAPHIC STUDIES: I have personally reviewed the radiological images as listed and agreed with the findings in the report. CT CHEST W CONTRAST  Result Date: 01/30/2020 CLINICAL DATA:  64 year old male with right flank pain. Right lower lobe pulmonary nodule. EXAM: CT CHEST, ABDOMEN, AND PELVIS WITH CONTRAST TECHNIQUE: Multidetector CT imaging of the chest, abdomen and pelvis was performed following the standard protocol during bolus administration of intravenous contrast. CONTRAST:  143m OMNIPAQUE IOHEXOL 300 MG/ML  SOLN COMPARISON:  None. FINDINGS: CT CHEST FINDINGS Cardiovascular: There is no cardiomegaly or pericardial effusion. The thoracic aorta is unremarkable. The origins of the great vessels of the aortic arch appear patent as visualized. The central pulmonary arteries are patent. Mediastinum/Nodes: There is no hilar or mediastinal adenopathy. The esophagus and the thyroid gland are grossly unremarkable. No mediastinal fluid collection. Lungs/Pleura: There are bibasilar linear atelectasis/scarring. Multiple pulmonary nodules predominantly involving the lower lungs including a 10 mm right middle lobe nodule (98/3), a 6 mm right lower lobe subpleural  nodule (93/3), a 4 mm right middle lobe nodule (80/3), a 4 mm lingular nodule (67/3), and a 3 mm left lower lobe subpleural nodule (114/3). No focal consolidation, pleural effusion, or pneumothorax. The central airways are patent. Musculoskeletal: Old healed right anterior sixth rib fracture. There is a small indeterminate lucency  in the anterior aspect of the right sixth rib anterior to the old fracture (93/3) which is not evaluated on this CT. Clinical correlation and attention on follow-up recommended. If there is history of known primary cancer or other concern for metastatic disease, bone scan may provide better evaluation. Several additional faint lucency is noted in the sternum. There is compression fracture of T9 with approximately 40% loss of vertebral body height, age indeterminate. Correlation with clinical exam and point tenderness recommended. There is mild perivertebral edema at this level and therefore an acute or subacute fracture is a possibility. No retropulsed fragment. CT ABDOMEN PELVIS FINDINGS No intra-abdominal free air or free fluid. Hepatobiliary: The liver is unremarkable. No intrahepatic biliary ductal dilatation. The gallbladder is unremarkable. Pancreas: Unremarkable. No pancreatic ductal dilatation or surrounding inflammatory changes. Spleen: Normal in size without focal abnormality. Adrenals/Urinary Tract: The adrenal glands are unremarkable. There is no hydronephrosis on either side. There is symmetric enhancement and excretion of contrast by both kidneys. The visualized ureters and urinary bladder appear unremarkable. Stomach/Bowel: There is apparent focal area of thickening at the gastroesophageal junction (51/2 and coronal 86/4). This may be related to underdistention, however underlying lesion is not excluded. Further evaluation with upper GI study or direct visualization with endoscopy is recommended. There is no bowel obstruction or active inflammation. The appendix is normal.  Vascular/Lymphatic: Mild aortoiliac atherosclerotic disease. The IVC is unremarkable. No portal venous gas. There is no adenopathy. Reproductive: The prostate and seminal vesicles are grossly unremarkable. No pelvic mass. Other: None Musculoskeletal: Osteopenia with degenerative changes of the spine. There is a 3.4 x 2.5 cm lytic lesion in the left pubic bone as well as a smaller lytic lesion in the right pubic bone. Additional faint subcentimeter lucencies may be present within the spine. Age indeterminate, old-appearing compression fracture of the inferior endplate of L4. No acute osseous pathology. IMPRESSION: 1. Multiple bilateral pulmonary nodules measuring up to 10 mm in the right middle lobe. These nodules are indeterminate and may be inflammatory/infectious in etiology. However, metastatic disease is not excluded in light of osseous findings. Clinical correlation and multidisciplinary consult advised. 2. Multiple osseous lytic lesions with the largest involving the left pubic bone concerning for metastatic disease. Further evaluation with bone scan recommended. 3. Compression fracture of T9 with approximately 40% loss of vertebral body height, age indeterminate. Correlation with clinical exam and point tenderness recommended. 4. Apparent focal area of thickening at the gastroesophageal junction. Further evaluation with upper GI study or direct visualization with endoscopy is recommended. 5. No bowel obstruction. Normal appendix. 6. Aortic Atherosclerosis (ICD10-I70.0). Electronically Signed   By: Anner Crete M.D.   On: 01/30/2020 20:06   CT ABDOMEN PELVIS W CONTRAST  Result Date: 01/30/2020 CLINICAL DATA:  64 year old male with right flank pain. Right lower lobe pulmonary nodule. EXAM: CT CHEST, ABDOMEN, AND PELVIS WITH CONTRAST TECHNIQUE: Multidetector CT imaging of the chest, abdomen and pelvis was performed following the standard protocol during bolus administration of intravenous contrast.  CONTRAST:  132m OMNIPAQUE IOHEXOL 300 MG/ML  SOLN COMPARISON:  None. FINDINGS: CT CHEST FINDINGS Cardiovascular: There is no cardiomegaly or pericardial effusion. The thoracic aorta is unremarkable. The origins of the great vessels of the aortic arch appear patent as visualized. The central pulmonary arteries are patent. Mediastinum/Nodes: There is no hilar or mediastinal adenopathy. The esophagus and the thyroid gland are grossly unremarkable. No mediastinal fluid collection. Lungs/Pleura: There are bibasilar linear atelectasis/scarring. Multiple pulmonary nodules predominantly involving the lower lungs including a  10 mm right middle lobe nodule (98/3), a 6 mm right lower lobe subpleural nodule (93/3), a 4 mm right middle lobe nodule (80/3), a 4 mm lingular nodule (67/3), and a 3 mm left lower lobe subpleural nodule (114/3). No focal consolidation, pleural effusion, or pneumothorax. The central airways are patent. Musculoskeletal: Old healed right anterior sixth rib fracture. There is a small indeterminate lucency in the anterior aspect of the right sixth rib anterior to the old fracture (93/3) which is not evaluated on this CT. Clinical correlation and attention on follow-up recommended. If there is history of known primary cancer or other concern for metastatic disease, bone scan may provide better evaluation. Several additional faint lucency is noted in the sternum. There is compression fracture of T9 with approximately 40% loss of vertebral body height, age indeterminate. Correlation with clinical exam and point tenderness recommended. There is mild perivertebral edema at this level and therefore an acute or subacute fracture is a possibility. No retropulsed fragment. CT ABDOMEN PELVIS FINDINGS No intra-abdominal free air or free fluid. Hepatobiliary: The liver is unremarkable. No intrahepatic biliary ductal dilatation. The gallbladder is unremarkable. Pancreas: Unremarkable. No pancreatic ductal dilatation or  surrounding inflammatory changes. Spleen: Normal in size without focal abnormality. Adrenals/Urinary Tract: The adrenal glands are unremarkable. There is no hydronephrosis on either side. There is symmetric enhancement and excretion of contrast by both kidneys. The visualized ureters and urinary bladder appear unremarkable. Stomach/Bowel: There is apparent focal area of thickening at the gastroesophageal junction (51/2 and coronal 86/4). This may be related to underdistention, however underlying lesion is not excluded. Further evaluation with upper GI study or direct visualization with endoscopy is recommended. There is no bowel obstruction or active inflammation. The appendix is normal. Vascular/Lymphatic: Mild aortoiliac atherosclerotic disease. The IVC is unremarkable. No portal venous gas. There is no adenopathy. Reproductive: The prostate and seminal vesicles are grossly unremarkable. No pelvic mass. Other: None Musculoskeletal: Osteopenia with degenerative changes of the spine. There is a 3.4 x 2.5 cm lytic lesion in the left pubic bone as well as a smaller lytic lesion in the right pubic bone. Additional faint subcentimeter lucencies may be present within the spine. Age indeterminate, old-appearing compression fracture of the inferior endplate of L4. No acute osseous pathology. IMPRESSION: 1. Multiple bilateral pulmonary nodules measuring up to 10 mm in the right middle lobe. These nodules are indeterminate and may be inflammatory/infectious in etiology. However, metastatic disease is not excluded in light of osseous findings. Clinical correlation and multidisciplinary consult advised. 2. Multiple osseous lytic lesions with the largest involving the left pubic bone concerning for metastatic disease. Further evaluation with bone scan recommended. 3. Compression fracture of T9 with approximately 40% loss of vertebral body height, age indeterminate. Correlation with clinical exam and point tenderness  recommended. 4. Apparent focal area of thickening at the gastroesophageal junction. Further evaluation with upper GI study or direct visualization with endoscopy is recommended. 5. No bowel obstruction. Normal appendix. 6. Aortic Atherosclerosis (ICD10-I70.0). Electronically Signed   By: Anner Crete M.D.   On: 01/30/2020 20:06   CT BONE MARROW BIOPSY & ASPIRATION  Result Date: 02/12/2020 INDICATION: 64 year old with abnormal serum protein electrophoresis and lytic bone lesions. Concern for plasma cell disorder. Request for bone marrow biopsy. EXAM: CT GUIDED BONE MARROW ASPIRATES AND BIOPSY Physician: Stephan Minister. Anselm Pancoast, MD MEDICATIONS: None. ANESTHESIA/SEDATION: Fentanyl 100 mcg IV; Versed 2.0 mg IV Moderate Sedation Time:  13 minutes The patient was continuously monitored during the procedure by the interventional radiology  nurse under my direct supervision. COMPLICATIONS: None immediate. PROCEDURE: The procedure was explained to the patient. The risks and benefits of the procedure were discussed and the patient's questions were addressed. Informed consent was obtained from the patient. The patient was placed prone on CT table. Images of the pelvis were obtained. The right side of back was prepped and draped in sterile fashion. The skin and right posterior ilium were anesthetized with 1% lidocaine. 11 gauge bone needle was directed into the right ilium with CT guidance. Two aspirates and one core biopsy were obtained. Bandage placed over the puncture site. IMPRESSION: CT guided bone marrow aspiration and core biopsy. Electronically Signed   By: Markus Daft M.D.   On: 02/12/2020 10:45      ASSESSMENT & PLAN:  1. Multiple myeloma not having achieved remission ([place])   2. Lytic bone lesions on xray   3. Compression fracture of T9 vertebra, initial encounter ([place])   4. Neoplasm related pain   5. Goals of care, counseling/discussion    #IgG multiple myeloma, stage II Baseline M protein 4.1, kappa light  chain level 333.8 Beta-2 microglobulin 2.1, albumin 2.7. 02/12/2020, bone marrow biopsy showed hypercellular for age, 80% plasma cell which are complex restricted by light chain in situ heparinization.  Background hematopoiesis is present but reduced. Cytogenetics is normal Myeloma FISH panel-deletion 1P, Standard risk.  The diagnosis and care plan were discussed with patient in detail.  NCCN guidelines were reviewed and shared with patient.   I discussed with patient that multiple myeloma is not curable but treatable. The goal of treatment which is to palliate disease, disease related symptoms, improve quality of life and hopefully prolong life was highlighted in our discussion.  Chemotherapy education was provided.  We had discussed the composition of chemotherapy regimen, length of chemo cycle, duration of treatment and the time to assess response to treatment.    I explained to the patient the risks and benefits of chemotherapy RVD including all but not limited to hair loss, mouth sore, nausea, vomiting, diarrhea, low blood counts, bleeding, neuropathy, thrombosis and risk of life threatening infection and even death, secondary malignancy etc.  . Patient voices understanding and willing to proceed chemotherapy.  Proceed with another dose of pulsatile dexamethasone 40 mg x 1.  Prescription sent to pharmacy.  # Chemotherapy education;  Antiemetics- Zofran, shingle prophylaxis with acyclovir, VTE prophylaxis with Aspirin 46m daily.  #Neoplasm related pain, continue Norco 5/325 every 6 hours as needed.  I will add Xtampza 13.5 mg extended release every 12 hours. #Vertebral compression fracture.  Suspect secondary to multiple myeloma lesion. Will obtain MRI spine.  Refer to radiation oncology.  Avoid heavy lifting  #Focal area of thickening at the GE junction.  Discussed with patient.  Establish care with gastroenterology at the next visit. #Lung nodules, measures up to 10 mm on the right middle  lobe.  Indeterminate.  Attention on follow-up.  #Medication induced constipation.  Recommend patient to take Senokot 2 tablets daily, MiraLAX as needed.  Supportive care measures are necessary for patient well-being and will be provided as necessary. We spent sufficient time to discuss many aspect of care, questions were answered to patient's satisfaction.   Orders Placed This Encounter  Procedures  . MR Cervical Spine W Wo Contrast    Standing Status:   Future    Standing Expiration Date:   02/20/2021    Order Specific Question:   If indicated for the ordered procedure, I authorize the administration of  contrast media per Radiology protocol    Answer:   Yes    Order Specific Question:   What is the patient's sedation requirement?    Answer:   No Sedation    Order Specific Question:   Does the patient have a pacemaker or implanted devices?    Answer:   No    Order Specific Question:   Use SRS Protocol?    Answer:   Yes    Order Specific Question:   Call Results- Best Contact Number?    Answer:   [phone] Do Not Hold    Order Specific Question:   Preferred imaging location?    Answer:   [facility] (table limit - 550lbs)  . MR Lumbar Spine W Wo Contrast    Standing Status:   Future    Standing Expiration Date:   02/20/2021    Order Specific Question:   If indicated for the ordered procedure, I authorize the administration of contrast media per Radiology protocol    Answer:   Yes    Order Specific Question:   What is the patient's sedation requirement?    Answer:   No Sedation    Order Specific Question:   Does the patient have a pacemaker or implanted devices?    Answer:   No    Order Specific Question:   Use SRS Protocol?    Answer:   Yes    Order Specific Question:   Preferred imaging location?    Answer:   [facility] (table limit - 550lbs)  . CBC with Differential/Platelet    Standing Status:   Future    Number of Occurrences:   1    Standing Expiration  Date:   02/20/2021  . Comprehensive metabolic panel    Standing Status:   Future    Number of Occurrences:   1    Standing Expiration Date:   02/20/2021  . Protein electrophoresis, serum    Standing Status:   Standing    Number of Occurrences:   20    Standing Expiration Date:   02/20/2021  . IGG    Standing Status:   Standing    Number of Occurrences:   20    Standing Expiration Date:   02/20/2021  . Kappa/lambda light chains    Standing Status:   Standing    Number of Occurrences:   20    Standing Expiration Date:   02/20/2021  . CBC with Differential    Standing Status:   Standing    Number of Occurrences:   20    Standing Expiration Date:   02/20/2021  . Comprehensive metabolic panel    Standing Status:   Standing    Number of Occurrences:   20    Standing Expiration Date:   02/20/2021  . Ambulatory referral to Radiation Oncology    Referral Priority:   Routine    Referral Type:   Consultation    Referral Reason:   Specialty Services Required    Requested Specialty:   Radiation Oncology    Number of Visits Requested:   1  . Physician communication order    Treatment x 4-6 cycles prior to stem cell transplant.  Marland Kitchen PHYSICIAN COMMUNICATION ORDER    Order aspirin 81-339m OR Coumadin for thromboembolic prophylaxis via "add orders". Target Coumadin INR = 2-3.    All questions were answered. The patient knows to call the clinic with any problems questions or concerns.  cc MUlyess Blossom PA  Return of visit:  1-2 weeks to start chemotherapy.  Thank you for this kind referral and the opportunity to participate in the care of this patient. A copy of today's note is routed to referring provider    Earlie Server, MD, PhD Hematology Oncology [facility] Pager- [phone] 02/21/2020

## 2020-02-22 ENCOUNTER — Ambulatory Visit
Admission: RE | Admit: 2020-02-22 | Discharge: 2020-02-22 | Disposition: A | Discharge status: Home / Self Care | Payer: Medicare Other | Source: Ambulatory Visit | Attending: Oncology | Admitting: Oncology

## 2020-02-22 ENCOUNTER — Other Ambulatory Visit: Payer: Self-pay

## 2020-02-22 DIAGNOSIS — M899 Disorder of bone, unspecified: Secondary | ICD-10-CM | POA: Diagnosis present

## 2020-02-22 DIAGNOSIS — S22070A Wedge compression fracture of T9-T10 vertebra, initial encounter for closed fracture: Secondary | ICD-10-CM | POA: Diagnosis present

## 2020-02-22 MED ORDER — GADOBUTROL 1 MMOL/ML IV SOLN
10.0000 mL | Freq: Once | INTRAVENOUS | Status: AC | PRN
  Administered 2020-02-22: 10 mL via INTRAVENOUS

## 2020-02-24 ENCOUNTER — Telehealth: Payer: Self-pay | Admitting: Pharmacist

## 2020-02-24 ENCOUNTER — Telehealth: Payer: Self-pay

## 2020-02-24 ENCOUNTER — Telehealth: Payer: Self-pay | Admitting: Pharmacy Technician

## 2020-02-24 DIAGNOSIS — C9000 Multiple myeloma not having achieved remission: Secondary | ICD-10-CM

## 2020-02-24 NOTE — Telephone Encounter (Signed)
Oral Oncology Pharmacy Student Encounter  Received new prescription for Revlimid (lenalidomide) for the treatment of multiple myeloma in conjunction with Velcade and dexamethasone, planned duration until disease progression or toxicity.  Labs from 02/21/20 assessed, no relevant lab abnormalities. Prescription dose and frequency assessed.   Current medication list in Epic reviewed, no relevant DDIs identified.  Evaluated chart and no patient barriers to medication adherence identified.   Oral Oncology Clinic will continue to follow for insurance authorization, copayment issues, initial counseling and start date.  Barton Dubois, PharmD Candidate [phone] [facility] Oral Chemotherapy Navigation [facility] [phone]  02/24/2020 2:32 PM

## 2020-02-24 NOTE — Telephone Encounter (Signed)
Pt enrolled in Revlimid program via online @ [email or web address].   Revlimid 25 mg, 2 weeks on , 1 week off [id number]

## 2020-02-24 NOTE — Telephone Encounter (Addendum)
Please schedule lab/MD/velcade *new* on 11/22. If velcade can't be scheduled on 11/22 ok to schedule separate another day next week. We will notify pt of appt.

## 2020-02-24 NOTE — Telephone Encounter (Signed)
-----   Message from Earlie Server, MD sent at 02/21/2020  8:20 PM EST ----- Plan to start him on RVD.  Alyson, please look up his coverage for Revlimid 25mg  2 weeks on and 1 week off, If covered, please deliver. Ask him to go ahead and start.  Arrange him to follow up with lab MD velcade next week or 11/22. Thanks. Please recommend him to start Aspirin 81mg , as well as Acyclovir for shingle prophylaxis. I also sent Zofran Rx too. Thank you

## 2020-02-24 NOTE — Telephone Encounter (Signed)
Left message for patient to call for an update in plan.

## 2020-02-24 NOTE — Patient Instructions (Signed)
Bortezomib injection What is this medicine? BORTEZOMIB (bor TEZ oh mib) is a medicine that targets proteins in cancer cells and stops the cancer cells from growing. It is used to treat multiple myeloma and mantle-cell lymphoma. This medicine may be used for other purposes; ask your health care provider or pharmacist if you have questions. COMMON BRAND NAME(S): Velcade What should I tell my health care provider before I take this medicine? They need to know if you have any of these conditions:  diabetes  heart disease  irregular heartbeat  liver disease  on hemodialysis  low blood counts, like low white blood cells, platelets, or hemoglobin  peripheral neuropathy  taking medicine for blood pressure  an unusual or allergic reaction to bortezomib, mannitol, boron, other medicines, foods, dyes, or preservatives  pregnant or trying to get pregnant  breast-feeding How should I use this medicine? This medicine is for injection into a vein or for injection under the skin. It is given by a health care professional in a hospital or clinic setting. Talk to your pediatrician regarding the use of this medicine in children. Special care may be needed. Overdosage: If you think you have taken too much of this medicine contact a poison control center or emergency room at once. NOTE: This medicine is only for you. Do not share this medicine with others. What if I miss a dose? It is important not to miss your dose. Call your doctor or health care professional if you are unable to keep an appointment. What may interact with this medicine? This medicine may interact with the following medications:  ketoconazole  rifampin  ritonavir  St. John's Wort This list may not describe all possible interactions. Give your health care provider a list of all the medicines, herbs, non-prescription drugs, or dietary supplements you use. Also tell them if you smoke, drink alcohol, or use illegal drugs. Some  items may interact with your medicine. What should I watch for while using this medicine? You may get drowsy or dizzy. Do not drive, use machinery, or do anything that needs mental alertness until you know how this medicine affects you. Do not stand or sit up quickly, especially if you are an older patient. This reduces the risk of dizzy or fainting spells. In some cases, you may be given additional medicines to help with side effects. Follow all directions for their use. Call your doctor or health care professional for advice if you get a fever, chills or sore throat, or other symptoms of a cold or flu. Do not treat yourself. This drug decreases your body's ability to fight infections. Try to avoid being around people who are sick. This medicine may increase your risk to bruise or bleed. Call your doctor or health care professional if you notice any unusual bleeding. You may need blood work done while you are taking this medicine. In some patients, this medicine may cause a serious brain infection that may cause death. If you have any problems seeing, thinking, speaking, walking, or standing, tell your doctor right away. If you cannot reach your doctor, urgently seek other source of medical care. Check with your doctor or health care professional if you get an attack of severe diarrhea, nausea and vomiting, or if you sweat a lot. The loss of too much body fluid can make it dangerous for you to take this medicine. Do not become pregnant while taking this medicine or for at least 7 months after stopping it. Women should inform their doctor   if they wish to become pregnant or think they might be pregnant. Men should not father a child while taking this medicine and for at least 4 months after stopping it. There is a potential for serious side effects to an unborn child. Talk to your health care professional or pharmacist for more information. Do not breast-feed an infant while taking this medicine or for 2  months after stopping it. This medicine may interfere with the ability to have a child. You should talk with your doctor or health care professional if you are concerned about your fertility. What side effects may I notice from receiving this medicine? Side effects that you should report to your doctor or health care professional as soon as possible:  allergic reactions like skin rash, itching or hives, swelling of the face, lips, or tongue  breathing problems  changes in hearing  changes in vision  fast, irregular heartbeat  feeling faint or lightheaded, falls  pain, tingling, numbness in the hands or feet  right upper belly pain  seizures  swelling of the ankles, feet, hands  unusual bleeding or bruising  unusually weak or tired  vomiting  yellowing of the eyes or skin Side effects that usually do not require medical attention (report to your doctor or health care professional if they continue or are bothersome):  changes in emotions or moods  constipation  diarrhea  loss of appetite  headache  irritation at site where injected  nausea This list may not describe all possible side effects. Call your doctor for medical advice about side effects. You may report side effects to FDA at 1-800-FDA-1088. Where should I keep my medicine? This drug is given in a hospital or clinic and will not be stored at home. NOTE: This sheet is a summary. It may not cover all possible information. If you have questions about this medicine, talk to your doctor, pharmacist, or health care provider.  2020 Elsevier/Gold Standard (2017-08-07 16:29:31)  Lenalidomide Oral Capsules What is this medicine? LENALIDOMIDE (len a LID oh mide) is a chemotherapy drug that targets specific proteins within cancer cells and stops the cancer cell from growing. It is used to treat multiple myeloma, certain types of lymphoma, and some myelodysplastic syndromes that cause severe anemia requiring blood  transfusions. This medicine may be used for other purposes; ask your health care provider or pharmacist if you have questions. COMMON BRAND NAME(S): Revlimid What should I tell my health care provider before I take this medicine? They need to know if you have any of these conditions:  blood clots in the legs or the lungs  high blood pressure  high cholesterol  infection  irregular monthly periods or menstrual cycles  kidney disease  liver disease  smoke tobacco  thyroid disease  an unusual or allergic reaction to lenalidomide, thalidomide, other medicines, foods, dyes, or preservatives  pregnant or trying to get pregnant  breast-feeding How should I use this medicine? Take this medicine by mouth with a glass of water. Follow the directions on the prescription label. Do not cut, crush, or chew this medicine. Take your medicine at regular intervals. Do not take it more often than directed. Do not stop taking except on your doctor's advice. A MedGuide will be given with each prescription and refill. Read this guide carefully each time. The MedGuide may change frequently. Talk to your pediatrician regarding the use of this medicine in children. Special care may be needed. Overdosage: If you think you have taken too much   of this medicine contact a poison control center or emergency room at once. NOTE: This medicine is only for you. Do not share this medicine with others. What if I miss a dose? If you miss a dose, take it as soon as you can. If your next dose is to be taken in less than 12 hours, then do not take the missed dose. Take the next dose at your regular time. Do not take double or extra doses. What may interact with this medicine? This medicine may interact with the following medications:  digoxin  medicines that increase the risk of thrombosis like estrogens or erythropoietic agents (e.g., epoetin alfa and darbepoetin alfa)  warfarin This list may not describe all  possible interactions. Give your health care provider a list of all the medicines, herbs, non-prescription drugs, or dietary supplements you use. Also tell them if you smoke, drink alcohol, or use illegal drugs. Some items may interact with your medicine. What should I watch for while using this medicine? You may need blood work done while you are taking this medicine. This medicine may cause serious skin reactions. They can happen weeks to months after starting the medicine. Contact your health care provider right away if you notice fevers or flu-like symptoms with a rash. The rash may be red or purple and then turn into blisters or peeling of the skin. Or, you might notice a red rash with swelling of the face, lips or lymph nodes in your neck or under your arms. This medicine is available only through a special program. Doctors, pharmacies, and patients must meet all of the conditions of the program. Your health care provider will help you get signed up with the program if you need this medicine. Through the program you will only receive up to a 28 day supply of the medicine at one time. You will need a new prescription for each refill. This medicine can cause birth defects. Do not get pregnant while taking this drug. Females with child-bearing potential will need to have 2 negative pregnancy tests before starting this medicine. Pregnancy testing must be done every 2 to 4 weeks as directed while taking this medicine. Use 2 reliable forms of birth control together while you are taking this medicine and for 4 weeks after you stop taking this medicine. If you think that you might be pregnant talk to your doctor right away. Do not breast-feed an infant while taking this medicine. Men must use a latex condom during sexual contact with a woman while taking this medicine and for 4 weeks after you stop taking this medicine. A latex condom is needed even if you have had a vasectomy. Contact your doctor right away if  your partner becomes pregnant. Do not donate sperm while taking this medicine and for 4 weeks after you stop taking this medicine. Do not give blood while taking the medicine and for 4 weeks after completion of treatment to avoid exposing pregnant women to the medicine through the donated blood. Talk to your doctor about your risk of cancer. You may be more at risk for certain types of cancers if you take this medicine. What side effects may I notice from receiving this medicine? Side effects that you should report to your doctor or health care professional as soon as possible:  allergic reactions like skin rash, itching or hives, swelling of the face, lips, or tongue  breathing problems  chest pain or tightness  fast, irregular heartbeat  feeling faint  low   counts - this medicine may decrease the number of white blood cells, red blood cells and platelets. You may be at increased risk for infections and bleeding.  rash, fever, and swollen lymph nodes  redness, blistering, peeling or loosening of the skin, including inside the mouth  seizures  signs and symptoms of bleeding such as bloody or black, tarry stools; red or dark-brown urine; spitting up blood or brown material that looks like coffee grounds; red spots on the skin; unusual bruising or bleeding from the eye, gums, or nose  signs and symptoms of a blood clot such as breathing problems; changes in vision; chest pain; severe, sudden headache; pain, swelling, warmth in the leg; trouble speaking; sudden numbness or weakness of the face, arm or leg  signs and symptoms of liver injury like dark yellow or brown urine; general ill feeling or flu-like symptoms; light-colored stools; loss of appetite; nausea; right upper belly pain; unusually weak or tired; yellowing of the eyes or skin  signs and symptoms of a stroke like changes in vision; confusion; trouble speaking or understanding; severe headaches; sudden numbness or weakness  of the face, arm or leg; trouble walking; dizziness; loss of balance or coordination  sweating  vomiting Side effects that usually do not require medical attention (report to your doctor or health care professional if they continue or are bothersome):  constipation  cough  diarrhea  joint pain  muscle cramps  swelling of the arms, legs, or skin  tiredness  trouble sleeping This list may not describe all possible side effects. Call your doctor for medical advice about side effects. You may report side effects to FDA at 1-800-FDA-1088. Where should I keep my medicine? Keep out of the reach of children. Store at room temperature between 15 and 30 degrees C (59 and 86 degrees F). Throw away any unused medicine after the expiration date. NOTE: This sheet is a summary. It may not cover all possible information. If you have questions about this medicine, talk to your doctor, pharmacist, or health care provider.  2020 Elsevier/Gold Standard (2018-06-29 15:09:17) Prednisone tablets What is this medicine? PREDNISONE (PRED ni sone) is a corticosteroid. It is commonly used to treat inflammation of the skin, joints, lungs, and other organs. Common conditions treated include asthma, allergies, and arthritis. It is also used for other conditions, such as blood disorders and diseases of the adrenal glands. This medicine may be used for other purposes; ask your health care provider or pharmacist if you have questions. COMMON BRAND NAME(S): Deltasone, Predone, Sterapred, Sterapred DS What should I tell my health care provider before I take this medicine? They need to know if you have any of these conditions:  Cushing's syndrome  diabetes  glaucoma  heart disease  high blood pressure  infection (especially a virus infection such as chickenpox, cold sores, or herpes)  kidney disease  liver disease  mental illness  myasthenia gravis  osteoporosis  seizures  stomach or  intestine problems  thyroid disease  an unusual or allergic reaction to lactose, prednisone, other medicines, foods, dyes, or preservatives  pregnant or trying to get pregnant  breast-feeding How should I use this medicine? Take this medicine by mouth with a glass of water. Follow the directions on the prescription label. Take this medicine with food. If you are taking this medicine once a day, take it in the morning. Do not take more medicine than you are told to take. Do not suddenly stop taking your medicine because you may  develop a severe reaction. Your doctor will tell you how much medicine to take. If your doctor wants you to stop the medicine, the dose may be slowly lowered over time to avoid any side effects. Talk to your pediatrician regarding the use of this medicine in children. Special care may be needed. Overdosage: If you think you have taken too much of this medicine contact a poison control center or emergency room at once. NOTE: This medicine is only for you. Do not share this medicine with others. What if I miss a dose? If you miss a dose, take it as soon as you can. If it is almost time for your next dose, talk to your doctor or health care professional. You may need to miss a dose or take an extra dose. Do not take double or extra doses without advice. What may interact with this medicine? Do not take this medicine with any of the following medications:  metyrapone  mifepristone This medicine may also interact with the following medications:  aminoglutethimide  amphotericin B  aspirin and aspirin-like medicines  barbiturates  certain medicines for diabetes, like glipizide or glyburide  cholestyramine  cholinesterase inhibitors  cyclosporine  digoxin  diuretics  ephedrine  male hormones, like estrogens and birth control pills  isoniazid  ketoconazole  NSAIDS, medicines for pain and inflammation, like ibuprofen or  naproxen  phenytoin  rifampin  toxoids  vaccines  warfarin This list may not describe all possible interactions. Give your health care provider a list of all the medicines, herbs, non-prescription drugs, or dietary supplements you use. Also tell them if you smoke, drink alcohol, or use illegal drugs. Some items may interact with your medicine. What should I watch for while using this medicine? Visit your doctor or health care professional for regular checks on your progress. If you are taking this medicine over a prolonged period, carry an identification card with your name and address, the type and dose of your medicine, and your doctor's name and address. This medicine may increase your risk of getting an infection. Tell your doctor or health care professional if you are around anyone with measles or chickenpox, or if you develop sores or blisters that do not heal properly. If you are going to have surgery, tell your doctor or health care professional that you have taken this medicine within the last twelve months. Ask your doctor or health care professional about your diet. You may need to lower the amount of salt you eat. This medicine may increase blood sugar. Ask your healthcare provider if changes in diet or medicines are needed if you have diabetes. What side effects may I notice from receiving this medicine? Side effects that you should report to your doctor or health care professional as soon as possible:  allergic reactions like skin rash, itching or hives, swelling of the face, lips, or tongue  changes in emotions or moods  changes in vision  depressed mood  eye pain  fever or chills, cough, sore throat, pain or difficulty passing urine  signs and symptoms of high blood sugar such as being more thirsty or hungry or having to urinate more than normal. You may also feel very tired or have blurry vision.  swelling of ankles, feet Side effects that usually do not require  medical attention (report to your doctor or health care professional if they continue or are bothersome):  confusion, excitement, restlessness  headache  nausea, vomiting  skin problems, acne, thin and shiny skin  trouble sleeping  weight gain This list may not describe all possible side effects. Call your doctor for medical advice about side effects. You may report side effects to FDA at 1-800-FDA-1088. Where should I keep my medicine? Keep out of the reach of children. Store at room temperature between 15 and 30 degrees C (59 and 86 degrees F). Protect from light. Keep container tightly closed. Throw away any unused medicine after the expiration date. NOTE: This sheet is a summary. It may not cover all possible information. If you have questions about this medicine, talk to your doctor, pharmacist, or health care provider.  2020 Elsevier/Gold Standard (2017-12-26 10:54:22)

## 2020-02-24 NOTE — Telephone Encounter (Signed)
Oral Oncology Patient Advocate Encounter  Received notification from [employer] that prior authorization for Revlimid is required.  PA submitted on CoverMyMeds Key [id number]  Status is pending  Oral Oncology Clinic will continue to follow.  [place] Patient [place] Phone [phone] Fax [phone] 02/24/2020 8:52 AM

## 2020-02-24 NOTE — Telephone Encounter (Signed)
Oral Oncology Patient Advocate Encounter  Prior Authorization for Revlimid has been approved.    PA# [id number] Effective dates: 02/24/20 through 04/10/21  Patients co-pay is $2615.70.  Oral Oncology Clinic will continue to follow.   [place] Patient [place] Phone [phone] Fax [phone] 02/24/2020 8:55 AM

## 2020-02-25 ENCOUNTER — Telehealth: Payer: Self-pay

## 2020-02-25 ENCOUNTER — Telehealth: Payer: Self-pay | Admitting: Pharmacy Technician

## 2020-02-25 ENCOUNTER — Inpatient Hospital Stay: Payer: Medicare Other

## 2020-02-25 MED ORDER — LENALIDOMIDE 25 MG PO CAPS: 25.0000 mg | ORAL_CAPSULE | Freq: Every day | ORAL | 0 refills | Status: DC

## 2020-02-25 NOTE — Telephone Encounter (Signed)
Called patient's wife cell number and was able to speak with patient to inform him of plan.

## 2020-02-25 NOTE — Telephone Encounter (Signed)
Yes, mag citrate or Mirilax

## 2020-02-25 NOTE — Telephone Encounter (Signed)
Patient and patient wife is notified of MD recommendation.

## 2020-02-25 NOTE — Telephone Encounter (Signed)
Oral Oncology Patient Advocate Encounter  Was successful in securing patient a $11,000 grant from [facility] to provide copayment coverage for Revlimid.  This will keep the out of pocket expense at $0.     [employer] ID: [id number]  I have spoken with the patient.   The billing information is as follows and has been shared with [employer].    RxBin: [id number] PCN: [id number] Member ID: [id number] Group ID: [id number] Dates of Eligibility: 01/26/20 through 01/24/21  Fund:  [place] Patient [place] Phone [phone] Fax [phone] 02/25/2020 1:54 PM

## 2020-02-25 NOTE — Telephone Encounter (Signed)
Left message for patient to call.

## 2020-02-25 NOTE — Telephone Encounter (Signed)
Done..  Pt has been sched for lab/MD/velcade *new* on 11/22

## 2020-02-25 NOTE — Telephone Encounter (Signed)
Patient would like to know what he can do about his constipation.  He is taking the Senokot with last bowel movement 3 days ago and he does have occasional abdominal cramping.  His wife wants to know if he can drink the mag citrate to relieve the constipation.

## 2020-02-26 ENCOUNTER — Telehealth: Payer: Self-pay | Admitting: *Deleted

## 2020-02-26 ENCOUNTER — Ambulatory Visit
Admission: RE | Admit: 2020-02-26 | Discharge: 2020-02-26 | Disposition: A | Discharge status: Home / Self Care | Payer: Medicare Other | Source: Ambulatory Visit | Attending: Radiation Oncology | Admitting: Radiation Oncology

## 2020-02-26 ENCOUNTER — Encounter: Payer: Self-pay | Admitting: Radiation Oncology

## 2020-02-26 ENCOUNTER — Other Ambulatory Visit: Payer: Self-pay

## 2020-02-26 VITALS — BP 124/80 | HR 77 | Temp 97.8°F | Resp 20 | Wt 260.6 lb

## 2020-02-26 DIAGNOSIS — Z85828 Personal history of other malignant neoplasm of skin: Secondary | ICD-10-CM | POA: Diagnosis not present

## 2020-02-26 DIAGNOSIS — Z923 Personal history of irradiation: Secondary | ICD-10-CM | POA: Diagnosis not present

## 2020-02-26 DIAGNOSIS — F329 Major depressive disorder, single episode, unspecified: Secondary | ICD-10-CM | POA: Diagnosis not present

## 2020-02-26 DIAGNOSIS — I10 Essential (primary) hypertension: Secondary | ICD-10-CM | POA: Insufficient documentation

## 2020-02-26 DIAGNOSIS — Z79899 Other long term (current) drug therapy: Secondary | ICD-10-CM | POA: Insufficient documentation

## 2020-02-26 DIAGNOSIS — E785 Hyperlipidemia, unspecified: Secondary | ICD-10-CM | POA: Diagnosis not present

## 2020-02-26 DIAGNOSIS — C9000 Multiple myeloma not having achieved remission: Secondary | ICD-10-CM | POA: Diagnosis not present

## 2020-02-26 DIAGNOSIS — F17210 Nicotine dependence, cigarettes, uncomplicated: Secondary | ICD-10-CM | POA: Diagnosis not present

## 2020-02-26 DIAGNOSIS — C7951 Secondary malignant neoplasm of bone: Secondary | ICD-10-CM

## 2020-02-26 NOTE — Telephone Encounter (Signed)
Call returned to daughter and he will try Miralax and if that does not work, he will try a Fleet Enema

## 2020-02-26 NOTE — Telephone Encounter (Signed)
Daughter called reporting that patient has not had bowel movement since Saturday and that the medicine he was iven is not helping. Asking what he can eat or take to make him go. Please return her call [phone]

## 2020-02-26 NOTE — Consult Note (Signed)
NEW PATIENT EVALUATION  Name: Tommy Smith.  MRN: [id number]  Date:   02/26/2020     DOB: 19-Aug-1955   This 64 y.o. male patient presents to the clinic for initial evaluation of bone involvement from multiple myeloma.  With compression fracture of his thoracic spine causing significant pain  REFERRING PHYSICIAN: Sofie Hartigan, MD  CHIEF COMPLAINT:  Chief Complaint  Patient presents with  . Cancer    multiple myeloma    DIAGNOSIS: The encounter diagnosis was Multiple myeloma not having achieved remission ([place]).   PREVIOUS INVESTIGATIONS:  Clinical notes reviewed Pathology report reviewed CT scans MRI scans reviewed  HPI: Patient is an 64 year old male who presented with elevation in serum protein electrophoresis.  He had a monoclonal component.  He been having increasing back pain was using a cane for ambulatory assistance.  CT scan showed multiple osseous lytic lesions largest involving the left pubic bone also compression fracture at T9.  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  He is now referred for palliative radiation therapy to his thoracic spine for suspected multiple myeloma so the vertebral body collapse at T7.  Patient is having no focal neurologic deficits.  He is also having no other areas of significant pain.  PLANNED TREATMENT REGIMEN: Palliative radiation therapy to thoracic spine  PAST MEDICAL HISTORY:  has a past medical history of Depression, Hyperlipemia, Hypertension, and Hypogonadism in male.    PAST SURGICAL HISTORY:  Past Surgical History:  Procedure Laterality Date  . COLONOSCOPY  07/14/2008    FAMILY HISTORY: family history includes Prostate cancer in his father; Skin cancer in his mother.  SOCIAL HISTORY:  reports that he has been smoking cigarettes. He has a 30.00 pack-year smoking history. He has never used smokeless tobacco. He reports current alcohol use. He  reports that he does not use drugs.  ALLERGIES: Patient has no known allergies.  MEDICATIONS:  Current Outpatient Medications  Medication Sig Dispense Refill  . acyclovir (ZOVIRAX) 400 MG tablet Take 1 tablet (400 mg total) by mouth 2 (two) times daily. 60 tablet 3  . amLODipine (NORVASC) 10 MG tablet Take by mouth.    Marland Kitchen atorvastatin (LIPITOR) 40 MG tablet Take 40 mg by mouth daily.    Marland Kitchen buPROPion (WELLBUTRIN XL) 150 MG 24 hr tablet Take by mouth.    . dutasteride (AVODART) 0.5 MG capsule Take by mouth.    . gabapentin (NEURONTIN) 300 MG capsule Take 1 capsule (300 mg total) by mouth daily. 30 capsule 0  . HYDROcodone-acetaminophen (NORCO) 5-325 MG tablet Take 1 tablet by mouth every 6 (six) hours as needed for moderate pain. 30 tablet 0  . lenalidomide (REVLIMID) 25 MG capsule Take 1 capsule (25 mg total) by mouth daily. Take 14 days on, 7 days off, repeat every 21 days. 14 capsule 0  . losartan-hydrochlorothiazide (HYZAAR) 100-12.5 MG tablet Take 1 tablet by mouth daily.    . ondansetron (ZOFRAN) 8 MG tablet Take 1 tablet (8 mg total) by mouth 2 (two) times daily as needed (Nausea or vomiting). 30 tablet 1  . oxyCODONE ER (XTAMPZA ER) 13.5 MG C12A Take 13.5 mg by mouth every 12 (twelve) hours. 30 capsule 0  . Psyllium (METAMUCIL MULTIHEALTH FIBER PO) Take by mouth daily.    Marland Kitchen senna-docusate (SENOKOT-S) 8.6-50 MG tablet Take 2 tablets by mouth daily. 60 tablet 0   No current facility-administered medications for this encounter.  ECOG PERFORMANCE STATUS:  1 - Symptomatic but completely ambulatory  REVIEW OF SYSTEMS: Patient denies any weight loss, fatigue, weakness, fever, chills or night sweats. Patient denies any loss of vision, blurred vision. Patient denies any ringing  of the ears or hearing loss. No irregular heartbeat. Patient denies heart murmur or history of fainting. Patient denies any chest pain or pain radiating to her upper extremities. Patient denies any shortness of  breath, difficulty breathing at night, cough or hemoptysis. Patient denies any swelling in the lower legs. Patient denies any nausea vomiting, vomiting of blood, or coffee ground material in the vomitus. Patient denies any stomach pain. Patient states has had normal bowel movements no significant constipation or diarrhea. Patient denies any dysuria, hematuria or significant nocturia. Patient denies any problems walking, swelling in the joints or loss of balance. Patient denies any skin changes, loss of hair or loss of weight. Patient denies any excessive worrying or anxiety or significant depression. Patient denies any problems with insomnia. Patient denies excessive thirst, polyuria, polydipsia. Patient denies any swollen glands, patient denies easy bruising or easy bleeding. Patient denies any recent infections, allergies or URI. Patient "s visual fields have not changed significantly in recent time.   PHYSICAL EXAM: BP 124/80 (BP Location: Right Arm, Patient Position: Sitting, Cuff Size: Normal)   Pulse 77   Temp 97.8 F (36.6 C) (Tympanic)   Resp 20   Wt 260 lb 9.6 oz (118.2 kg)   SpO2 99%   BMI 33.46 kg/m  Range of motion of his lower extremities does not elicit pain.  Motor or sensory and DTR levels are equal and symmetric in lower extremities.  Deep palpation of the spine does not elicit pain.  Well-developed well-nourished patient in NAD. HEENT reveals PERLA, EOMI, discs not visualized.  Oral cavity is clear. No oral mucosal lesions are identified. Neck is clear without evidence of cervical or supraclavicular adenopathy. Lungs are clear to A&P. Cardiac examination is essentially unremarkable with regular rate and rhythm without murmur rub or thrill. Abdomen is benign with no organomegaly or masses noted. Motor sensory and DTR levels are equal and symmetric in the upper and lower extremities. Cranial nerves II through XII are grossly intact. Proprioception is intact. No peripheral adenopathy or  edema is identified. No motor or sensory levels are noted. Crude visual fields are within normal range.  LABORATORY DATA: Pathology report reviewed    RADIOLOGY RESULTS: CT scans and MRI scans reviewed compatible with above-stated findings   IMPRESSION: Myeloma involvement of T7 vertebral body causing compression fracture and significant back pain in 64 year old male  PLAN: At this time like to go ahead with palliative radiation therapy to his thoracic spine we will plan on delivering 25 Gray in 10 fractions.  Risks and benefits of treatment including skin reaction fatigue alteration of blood counts possible GI upset and diarrhea all were discussed in detail with the patient.  He seems to comprehend my treatment plan well.  I have personally set up and ordered CT simulation for this week.  Patient comprehends my recommendations well.  I would like to take this opportunity to thank you for allowing me to participate in the care of your patient.Noreene Filbert, MD

## 2020-02-26 NOTE — Telephone Encounter (Signed)
Has he tried miralax? If miralax not working I suggest to do one fleet enema.

## 2020-02-27 ENCOUNTER — Telehealth: Payer: Self-pay | Admitting: *Deleted

## 2020-02-27 ENCOUNTER — Ambulatory Visit: Payer: Medicare Other | Admitting: Oncology

## 2020-02-27 NOTE — Telephone Encounter (Signed)
When does patient start his Oral chemotherapy pill?

## 2020-02-27 NOTE — Telephone Encounter (Signed)
Called and spoke with patient. He knows to hold on starting his Revlimid until following his appt on Monday 11/22.

## 2020-02-27 NOTE — Telephone Encounter (Signed)
He can buy otc fleet enema and also otc mirilax

## 2020-02-27 NOTE — Telephone Encounter (Signed)
Alyson he has pretty high disease buren, he can go ahead start Revlimid and I will start velcade next Monday. Eventually will get both synchronized. Thanks.

## 2020-02-27 NOTE — Telephone Encounter (Signed)
Called him to let him know. He plans on taking his first Revlimid dose this evening. Has has already started taking aspirin 81mg .

## 2020-02-27 NOTE — Telephone Encounter (Signed)
Patient called reporting that his medicine came in the mail and he is asking when he is to start taking it. Please advise

## 2020-02-28 ENCOUNTER — Ambulatory Visit
Admission: RE | Admit: 2020-02-28 | Discharge: 2020-02-28 | Disposition: A | Discharge status: Home / Self Care | Payer: Medicare Other | Source: Ambulatory Visit | Attending: Radiation Oncology | Admitting: Radiation Oncology

## 2020-02-28 DIAGNOSIS — Z510 Encounter for antineoplastic radiation therapy: Secondary | ICD-10-CM | POA: Diagnosis not present

## 2020-02-28 DIAGNOSIS — C9000 Multiple myeloma not having achieved remission: Secondary | ICD-10-CM | POA: Insufficient documentation

## 2020-03-02 ENCOUNTER — Inpatient Hospital Stay: Payer: Medicare Other | Admitting: Pharmacist

## 2020-03-02 ENCOUNTER — Inpatient Hospital Stay: Payer: Medicare Other

## 2020-03-02 ENCOUNTER — Encounter: Payer: Self-pay | Admitting: Oncology

## 2020-03-02 ENCOUNTER — Inpatient Hospital Stay (HOSPITAL_BASED_OUTPATIENT_CLINIC_OR_DEPARTMENT_OTHER): Payer: Medicare Other | Admitting: Oncology

## 2020-03-02 ENCOUNTER — Other Ambulatory Visit: Payer: Self-pay

## 2020-03-02 VITALS — BP 121/66 | HR 80 | Temp 98.8°F | Resp 18 | Wt 248.1 lb

## 2020-03-02 DIAGNOSIS — D649 Anemia, unspecified: Secondary | ICD-10-CM

## 2020-03-02 DIAGNOSIS — C9000 Multiple myeloma not having achieved remission: Secondary | ICD-10-CM

## 2020-03-02 DIAGNOSIS — Z7189 Other specified counseling: Secondary | ICD-10-CM | POA: Diagnosis not present

## 2020-03-02 DIAGNOSIS — G893 Neoplasm related pain (acute) (chronic): Secondary | ICD-10-CM | POA: Diagnosis not present

## 2020-03-02 HISTORY — DX: Hypercalcemia: E83.52

## 2020-03-02 LAB — CBC WITH DIFFERENTIAL/PLATELET
Abs Immature Granulocytes: 0.02 10*3/uL (ref 0.00–0.07)
Basophils Absolute: 0 10*3/uL (ref 0.0–0.1)
Basophils Relative: 0 %
Eosinophils Absolute: 0.3 10*3/uL (ref 0.0–0.5)
Eosinophils Relative: 6 %
HCT: 32.6 % — ABNORMAL LOW (ref 39.0–52.0)
Hemoglobin: 10.8 g/dL — ABNORMAL LOW (ref 13.0–17.0)
Immature Granulocytes: 0 %
Lymphocytes Relative: 39 %
Lymphs Abs: 1.8 10*3/uL (ref 0.7–4.0)
MCH: 30 pg (ref 26.0–34.0)
MCHC: 33.1 g/dL (ref 30.0–36.0)
MCV: 90.6 fL (ref 80.0–100.0)
Monocytes Absolute: 0.3 10*3/uL (ref 0.1–1.0)
Monocytes Relative: 7 %
Neutro Abs: 2.2 10*3/uL (ref 1.7–7.7)
Neutrophils Relative %: 48 %
Platelets: 195 10*3/uL (ref 150–400)
RBC: 3.6 MIL/uL — ABNORMAL LOW (ref 4.22–5.81)
RDW: 15.5 % (ref 11.5–15.5)
WBC: 4.6 10*3/uL (ref 4.0–10.5)
nRBC: 0 % (ref 0.0–0.2)

## 2020-03-02 LAB — COMPREHENSIVE METABOLIC PANEL
ALT: 15 U/L (ref 0–44)
AST: 13 U/L — ABNORMAL LOW (ref 15–41)
Albumin: 2.4 g/dL — ABNORMAL LOW (ref 3.5–5.0)
Alkaline Phosphatase: 42 U/L (ref 38–126)
Anion gap: 11 (ref 5–15)
BUN: 9 mg/dL (ref 8–23)
CO2: 28 mmol/L (ref 22–32)
Calcium: 12 mg/dL — ABNORMAL HIGH (ref 8.9–10.3)
Chloride: 95 mmol/L — ABNORMAL LOW (ref 98–111)
Creatinine, Ser: 1.07 mg/dL (ref 0.61–1.24)
GFR, Estimated: >60 mL/min (ref >60)
Glucose, Bld: 90 mg/dL (ref 70–99)
Potassium: 3.6 mmol/L (ref 3.5–5.1)
Sodium: 134 mmol/L — ABNORMAL LOW (ref 135–145)
Total Bilirubin: 0.8 mg/dL (ref 0.3–1.2)
Total Protein: 9.4 g/dL — ABNORMAL HIGH (ref 6.5–8.1)

## 2020-03-02 MED ORDER — SODIUM CHLORIDE 0.9 % IV SOLN
Freq: Once | INTRAVENOUS | Status: AC
  Filled 2020-03-02: qty 250

## 2020-03-02 MED ORDER — BORTEZOMIB CHEMO SQ INJECTION 3.5 MG (2.5MG/ML)
1.3000 mg/m2 | Freq: Once | INTRAMUSCULAR | Status: AC
  Administered 2020-03-02: 3.25 mg via SUBCUTANEOUS
  Filled 2020-03-02: qty 1.3

## 2020-03-02 MED ORDER — ZOLEDRONIC ACID 4 MG/100ML IV SOLN
4.0000 mg | Freq: Once | INTRAVENOUS | Status: AC
  Administered 2020-03-02: 4 mg via INTRAVENOUS
  Filled 2020-03-02: qty 100

## 2020-03-02 MED ORDER — POLYETHYLENE GLYCOL 3350 17 G PO PACK: 17.0000 g | PACK | Freq: Every day | ORAL | 0 refills | Status: DC

## 2020-03-02 MED ORDER — XTAMPZA ER 18 MG PO C12A: 18.0000 mg | EXTENDED_RELEASE_CAPSULE | Freq: Two times a day (BID) | ORAL | 0 refills | Status: DC

## 2020-03-02 MED ORDER — DEXAMETHASONE 4 MG PO TABS
20.0000 mg | ORAL_TABLET | Freq: Once | ORAL | Status: AC
  Administered 2020-03-02: 20 mg via ORAL
  Filled 2020-03-02: qty 5

## 2020-03-02 NOTE — Progress Notes (Signed)
[facility]  [phone] [phone]  Patient Care Team: Sofie Hartigan, MD as PCP - General (Family Medicine) Noreene Filbert, MD as Radiation Oncologist (Radiation Oncology)   Name of the patient: Tommy Smith  [id number]  1955/07/16   Date of visit: 03/02/20  HPI: Patient is a 64 y.o. male for the treatment of multiple myeloma in conjunction with Velcade and dexamethasone, planned duration until disease progression or toxicity. Patient took first dose on 02/27/20. Provided with telephone education prior to first dose.  Reason for Consult: Oral chemotherapy Revlimid (lenalidomide) new start education.   PAST MEDICAL HISTORY: Past Medical History:  Diagnosis Date  . Depression   . Hypercalcemia 03/02/2020  . Hyperlipemia   . Hypertension   . Hypogonadism in male     PAST SURGICAL HISTORY:  Past Surgical History:  Procedure Laterality Date  . COLONOSCOPY  07/14/2008    HEMATOLOGY/ONCOLOGY HISTORY:  Oncology History  Multiple myeloma not having achieved remission ([place])  02/21/2020 Initial Diagnosis   Multiple myeloma not having achieved remission ([place])   03/02/2020 -  Chemotherapy   The patient had dexamethasone (DECADRON) tablet 20 mg, 20 mg, Oral,  Once, 1 of 6 cycles lenalidomide (REVLIMID) 25 MG capsule, 25 mg, Oral, Daily, 1 of 1 cycle, Start date: 02/25/2020, End date: -- bortezomib SQ (VELCADE) chemo injection (2.78m/mL concentration) 3.25 mg, 1.3 mg/m2 = 3.25 mg, Subcutaneous,  Once, 1 of 6 cycles  for chemotherapy treatment.      ALLERGIES:  has No Known Allergies.  MEDICATIONS:  Current Outpatient Medications  Medication Sig Dispense Refill  . acyclovir (ZOVIRAX) 400 MG tablet Take 1 tablet (400 mg total) by mouth 2 (two) times daily. 60 tablet 3  . amLODipine (NORVASC) 10 MG tablet Take by mouth.    . ASPIRIN 81 PO Take 81 mg by mouth daily.    .Marland Kitchenatorvastatin (LIPITOR) 40 MG  tablet Take 40 mg by mouth daily.    .Marland KitchenbuPROPion (WELLBUTRIN XL) 150 MG 24 hr tablet Take by mouth.    . dexamethasone (DECADRON) 4 MG tablet Take by mouth.    . dutasteride (AVODART) 0.5 MG capsule Take by mouth.    . gabapentin (NEURONTIN) 300 MG capsule Take 1 capsule (300 mg total) by mouth daily. 30 capsule 0  . HYDROcodone-acetaminophen (NORCO) 5-325 MG tablet Take 1 tablet by mouth every 6 (six) hours as needed for moderate pain. 30 tablet 0  . lenalidomide (REVLIMID) 25 MG capsule Take 1 capsule (25 mg total) by mouth daily. Take 14 days on, 7 days off, repeat every 21 days. 14 capsule 0  . losartan-hydrochlorothiazide (HYZAAR) 100-12.5 MG tablet Take 1 tablet by mouth daily.    . ondansetron (ZOFRAN) 8 MG tablet Take 1 tablet (8 mg total) by mouth 2 (two) times daily as needed (Nausea or vomiting). 30 tablet 1  . oxyCODONE ER (XTAMPZA ER) 18 MG C12A Take 18 mg by mouth every 12 (twelve) hours. 30 capsule 0  . polyethylene glycol (MIRALAX) 17 g packet Take 17 g by mouth daily. 14 each 0  . Psyllium (METAMUCIL MULTIHEALTH FIBER PO) Take by mouth daily.    .Marland Kitchensenna-docusate (SENOKOT-S) 8.6-50 MG tablet Take 2 tablets by mouth daily. 60 tablet 0   No current facility-administered medications for this visit.    VITAL SIGNS: There were no vitals taken for this visit. There were no vitals filed for this visit.  Estimated body mass index is 31.85 kg/m as  calculated from the following:   Height as of 02/12/20: 6' 2"  (1.88 m).   Weight as of an earlier encounter on 03/02/20: 112.5 kg (248 lb 1.6 oz).  LABS: CBC:    Component Value Date/Time   WBC 4.6 03/02/2020 1240   HGB 10.8 (L) 03/02/2020 1240   HCT 32.6 (L) 03/02/2020 1240   PLT 195 03/02/2020 1240   MCV 90.6 03/02/2020 1240   NEUTROABS 2.2 03/02/2020 1240   LYMPHSABS 1.8 03/02/2020 1240   MONOABS 0.3 03/02/2020 1240   EOSABS 0.3 03/02/2020 1240   BASOSABS 0.0 03/02/2020 1240   Comprehensive Metabolic Panel:    Component  Value Date/Time   NA 134 (L) 03/02/2020 1240   K 3.6 03/02/2020 1240   CL 95 (L) 03/02/2020 1240   CO2 28 03/02/2020 1240   BUN 9 03/02/2020 1240   CREATININE 1.07 03/02/2020 1240   GLUCOSE 90 03/02/2020 1240   CALCIUM 12.0 (H) 03/02/2020 1240   AST 13 (L) 03/02/2020 1240   ALT 15 03/02/2020 1240   ALKPHOS 42 03/02/2020 1240   BILITOT 0.8 03/02/2020 1240   PROT 9.4 (H) 03/02/2020 1240   ALBUMIN 2.4 (L) 03/02/2020 1240    RADIOGRAPHIC STUDIES: MR Cervical Spine W Wo Contrast  Result Date: 02/22/2020 CLINICAL DATA:  Multiple myeloma and back pain. Lytic disease on previous imaging. EXAM: MRI CERVICAL SPINE WITHOUT AND WITH CONTRAST TECHNIQUE: Multiplanar and multiecho pulse sequences of the cervical spine, to include the craniocervical junction and cervicothoracic junction, were obtained without and with intravenous contrast. CONTRAST:  47m GADAVIST GADOBUTROL 1 MMOL/ML IV SOLN COMPARISON:  CT 09/06/2016 FINDINGS: Alignment: Normal Vertebrae: Normal. No evidence of myeloma involvement in the cervical region. No regional fracture or lytic lesion. Cord: No cord compression or primary cord lesion. Posterior Fossa, vertebral arteries, paraspinal tissues: Negative Disc levels: No significant disc pathology. Minimal non-compressive disc bulges at C5-6 and C6-7. Minimal facet osteoarthritis. No compressive narrowing of the canal or foramina. IMPRESSION: 1. No evidence of myeloma involvement of the cervical region. 2. Minimal non-compressive disc bulges at C5-6 and C6-7. Electronically Signed   By: MNelson ChimesM.D.   On: 02/22/2020 12:30   MR Lumbar Spine W Wo Contrast  Result Date: 02/22/2020 CLINICAL DATA:  Myeloma and back pain. EXAM: MRI LUMBAR SPINE WITHOUT AND WITH CONTRAST TECHNIQUE: Multiplanar and multiecho pulse sequences of the lumbar spine were obtained without and with intravenous contrast. CONTRAST:  176mGADAVIST GADOBUTROL 1 MMOL/ML IV SOLN COMPARISON:  CT 01/29/2020 FINDINGS:  Segmentation:  5 lumbar type vertebral bodies. Alignment:  Normal Vertebrae: No evidence of focal marrow space lesion or definite lytic lesion to suggest myeloma involvement. The patient does have acute or subacute minor superior endplate fractures at T1L24nd L1. No imaging finding to suggest that these are anything other than benign osteoporotic fractures. Minor endplate deformity inferior L4 which was present on the previous CT could be old or subacute. This does not appear progressive. Conus medullaris and cauda equina: Conus extends to the L1 level. Conus and cauda equina appear normal. Paraspinal and other soft tissues: Negative Disc levels: T12-L1: Mild disc bulge.  No compressive stenosis. L1-2: Negative disc space. L2-3: Bulging of the disc. Slight indentation of the thecal sac but no compressive stenosis. L3-4: Bulging of the disc. Annular fissure. No compressive stenosis. L4-5: Central disc protrusion. Mild facet and ligamentous prominence. Stenosis of both lateral recesses with some potential to affect either L5 nerve. L5-S1: Mild disc bulge without compressive stenosis. IMPRESSION: 1.  No definite evidence of myeloma involvement of the lumbar spine. 2. Minor superior endplate fractures at Z61 and L1 which appear to be acute or subacute. These are not visible on the previous CT and therefore are new since 01/29/2020. There is no finding to suggest that these are anything other than benign osteoporotic fractures. 3. Minor inferior endplate deformity at L4 which was present on the previous CT could be old or subacute. Mild residual edema at that location which could indicate ongoing healing or relate to the endplate deformity. 4. Lumbar degenerative disc disease most notable at L4-5 where there is a central disc protrusion. Mild facet and ligamentous prominence. Mild stenosis of the lateral recesses with some potential to affect either L5 nerve. Electronically Signed   By: Nelson Chimes M.D.   On: 02/22/2020  12:36   CT BONE MARROW BIOPSY & ASPIRATION  Result Date: 02/12/2020 INDICATION: 64 year old with abnormal serum protein electrophoresis and lytic bone lesions. Concern for plasma cell disorder. Request for bone marrow biopsy. EXAM: CT GUIDED BONE MARROW ASPIRATES AND BIOPSY Physician: Stephan Minister. Anselm Pancoast, MD MEDICATIONS: None. ANESTHESIA/SEDATION: Fentanyl 100 mcg IV; Versed 2.0 mg IV Moderate Sedation Time:  13 minutes The patient was continuously monitored during the procedure by the interventional radiology nurse under my direct supervision. COMPLICATIONS: None immediate. PROCEDURE: The procedure was explained to the patient. The risks and benefits of the procedure were discussed and the patient's questions were addressed. Informed consent was obtained from the patient. The patient was placed prone on CT table. Images of the pelvis were obtained. The right side of back was prepped and draped in sterile fashion. The skin and right posterior ilium were anesthetized with 1% lidocaine. 11 gauge bone needle was directed into the right ilium with CT guidance. Two aspirates and one core biopsy were obtained. Bandage placed over the puncture site. IMPRESSION: CT guided bone marrow aspiration and core biopsy. Electronically Signed   By: Markus Daft M.D.   On: 02/12/2020 10:45     Assessment and Plan-  Continue Revlimid.   Patient Education Counseled patient on administration, dosing, side effects, monitoring, drug-food interactions, safe handling, storage, and disposal. Patient will take 1 capsule (25 mg total) by mouth daily. Take 14 days on, 7 days off, repeat every 21 days.  Patient taking aspirin 52m daily.  Side effects include but not limited to: diarrhea or constipation, N/V, decreased wbc/hgb/plt, rash, fatigue.    Reviewed with patient importance of keeping a medication schedule and plan for any missed doses.  After discussion with patient no patient barriers to medication adherence identified.    Mr. RPalmavoiced understanding and appreciation. All questions answered. Medication handout and calendar provided.  [facility]. Patient knows to call the office with questions or concerns. Oral Chemotherapy Navigation Clinic will continue to follow.  Medication Access Issues: No issues, [facility]  Patient expressed understanding and was in agreement with this plan. He also understands that He can call clinic at any time with any questions, concerns, or complaints.   Thank you for allowing me to participate in the care of this very pleasant patient.   Time Total: 15 mins  Visit consisted of counseling and education on dealing with issues of symptom management in the setting of serious and potentially life-threatening illness.Greater than 50%  of this time was spent counseling and coordinating care related to the above assessment and plan.  Signed by: AAlphonsa Overall  Hollice Espy, PharmD, Bebe Shaggy, CPP Hematology/Oncology Clinical Pharmacist Practitioner [facility] [phone]  03/02/2020 2:53 PM

## 2020-03-02 NOTE — Progress Notes (Signed)
Hematology/Oncology follow up note [facility] [phone] [phone]   Patient Care Team: Sofie Hartigan, MD as PCP - General (Family Medicine) Noreene Filbert, MD as Radiation Oncologist (Radiation Oncology)  REFERRING PROVIDER: Sofie Hartigan, MD  CHIEF COMPLAINTS/REASON FOR VISIT:  Follow-up for multiple myeloma HISTORY OF PRESENTING ILLNESS:   Tommy Rock. is a  65 y.o.  male with PMH listed below was seen in consultation at the request of  Sofie Hartigan, MD  for evaluation of abnormal serum protein electrophoresis.  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  Increase calcium level at 12.3, PTH was normal.  Creatinine 1, estimated GFR 91, CBC showed hemoglobin 12.4, Patient also recently had a CT chest abdomen pelvis with contrast for evaluation of right flank pain and right lower lobe nodule. 01/29/2020, CT showed multiple bilateral pulmonary nodules measuring up to 10 mm in the right middle lobe, nodules are indeterminate, infectious/inflammatory etiology versus metastatic disease. Multiple osseous lytic lesions, largest lesion involving the left pubic bone concerning for metastatic disease. Compression fractures at T9, age indeterminate. Appearance focal area of thickening at the gastroesophageal junction.  Further evaluation with upper GI study or direct visualization with endoscopy is recommended No bowel obstruction. Aortic atherosclerosis.  Patient reports NSAIDs as needed for lower back pain.  Mid lower back pain usually is worse when when he stands up from sitting position.  Patient works in the [employer].  Lives at home with wife.  Denies any unintentional weight loss, fever, chills, night sweating  # IgG multiple myeloma, stage II Baseline M protein 4.1, kappa light chain level 333.8 Beta-2 microglobulin 2.1, albumin 2.7. 02/12/2020, bone marrow biopsy showed  hypercellular for age, 80% plasma cell which are complex restricted by light chain in situ heparinization.  Background hematopoiesis is present but reduced. Cytogenetics is normal, Myeloma FISH panel-deletion 1P,Standard risk.  INTERVAL HISTORY Tommy Galea. is a 64 y.o. male who has above history reviewed by me today presents for follow up visit for  New diagnosis multiple myeloma Problems and complaints are listed below: During interval, 02/27/2020 patient has been started on Revlimid 25 mg D1-14 Patient reports back pain, partially relieved with current pain regimen.  It is worse with movement. Constipation.  She is taking Senokot 2 tablets daily.  Has not had a bowel movement since last Thursday.  He has tried Metamucil with no improvement.  Patient was advised to use a Fleet enema and try MiraLAX and he had not tried those medications.  Denies any loss of control of urine or bowel movement.  Denies any lower extremity weakness . Lower back pain and neck pain Patient has had MRI cervical spine and MRI lumbar spine done during the interval.   Review of Systems  Constitutional: Positive for fatigue. Negative for appetite change, chills, fever and unexpected weight change.  HENT:   Negative for hearing loss and voice change.   Eyes: Negative for eye problems and icterus.  Respiratory: Negative for chest tightness, cough and shortness of breath.   Cardiovascular: Negative for chest pain and leg swelling.  Gastrointestinal: Negative for abdominal distention and abdominal pain.  Endocrine: Negative for hot flashes.  Genitourinary: Negative for difficulty urinating, dysuria and frequency.   Musculoskeletal: Positive for back pain and neck pain. Negative for arthralgias.  Skin: Negative for itching and rash.  Neurological: Negative for light-headedness and numbness.  Hematological: Negative for adenopathy. Does not bruise/bleed easily.  Psychiatric/Behavioral: Negative for confusion.  MEDICAL HISTORY:  Past Medical History:  Diagnosis Date  . Depression   . Hypercalcemia 03/02/2020  . Hyperlipemia   . Hypertension   . Hypogonadism in male     SURGICAL HISTORY: Past Surgical History:  Procedure Laterality Date  . COLONOSCOPY  07/14/2008    SOCIAL HISTORY: Social History   Socioeconomic History  . Marital status: Married    Spouse name: Not on file  . Number of children: Not on file  . Years of education: Not on file  . Highest education level: Not on file  Occupational History  . Not on file  Tobacco Use  . Smoking status: Current Every Day Smoker    Packs/day: 1.00    Years: 30.00    Pack years: 30.00    Types: Cigarettes  . Smokeless tobacco: Never Used  Substance and Sexual Activity  . Alcohol use: Yes    Comment: occcassional  . Drug use: No  . Sexual activity: Not on file  Other Topics Concern  . Not on file  Social History Narrative  . Not on file   Social Determinants of Health   Financial Resource Strain:   . Difficulty of Paying Living Expenses: Not on file  Food Insecurity:   . Worried About Charity fundraiser in the Last Year: Not on file  . Ran Out of Food in the Last Year: Not on file  Transportation Needs:   . Lack of Transportation (Medical): Not on file  . Lack of Transportation (Non-Medical): Not on file  Physical Activity:   . Days of Exercise per Week: Not on file  . Minutes of Exercise per Session: Not on file  Stress:   . Feeling of Stress : Not on file  Social Connections:   . Frequency of Communication with Friends and Family: Not on file  . Frequency of Social Gatherings with Friends and Family: Not on file  . Attends Religious Services: Not on file  . Active Member of Clubs or Organizations: Not on file  . Attends Archivist Meetings: Not on file  . Marital Status: Not on file  Intimate Partner Violence:   . Fear of Current or Ex-Partner: Not on file  . Emotionally Abused: Not on file   . Physically Abused: Not on file  . Sexually Abused: Not on file    FAMILY HISTORY: Family History  Problem Relation Age of Onset  . Skin cancer Mother   . Prostate cancer Father     ALLERGIES:  has No Known Allergies.  MEDICATIONS:  Current Outpatient Medications  Medication Sig Dispense Refill  . acyclovir (ZOVIRAX) 400 MG tablet Take 1 tablet (400 mg total) by mouth 2 (two) times daily. 60 tablet 3  . amLODipine (NORVASC) 10 MG tablet Take by mouth.    . ASPIRIN 81 PO Take 81 mg by mouth daily.    Marland Kitchen atorvastatin (LIPITOR) 40 MG tablet Take 40 mg by mouth daily.    Marland Kitchen buPROPion (WELLBUTRIN XL) 150 MG 24 hr tablet Take by mouth.    . dexamethasone (DECADRON) 4 MG tablet Take by mouth.    . dutasteride (AVODART) 0.5 MG capsule Take by mouth.    . gabapentin (NEURONTIN) 300 MG capsule Take 1 capsule (300 mg total) by mouth daily. 30 capsule 0  . HYDROcodone-acetaminophen (NORCO) 5-325 MG tablet Take 1 tablet by mouth every 6 (six) hours as needed for moderate pain. 30 tablet 0  . lenalidomide (REVLIMID) 25 MG capsule Take 1 capsule (  25 mg total) by mouth daily. Take 14 days on, 7 days off, repeat every 21 days. 14 capsule 0  . losartan-hydrochlorothiazide (HYZAAR) 100-12.5 MG tablet Take 1 tablet by mouth daily.    . ondansetron (ZOFRAN) 8 MG tablet Take 1 tablet (8 mg total) by mouth 2 (two) times daily as needed (Nausea or vomiting). 30 tablet 1  . Psyllium (METAMUCIL MULTIHEALTH FIBER PO) Take by mouth daily.    Marland Kitchen senna-docusate (SENOKOT-S) 8.6-50 MG tablet Take 2 tablets by mouth daily. 60 tablet 0  . oxyCODONE ER (XTAMPZA ER) 18 MG C12A Take 18 mg by mouth every 12 (twelve) hours. 30 capsule 0  . polyethylene glycol (MIRALAX) 17 g packet Take 17 g by mouth daily. 14 each 0   No current facility-administered medications for this visit.     PHYSICAL EXAMINATION: ECOG PERFORMANCE STATUS: 1 - Symptomatic but completely ambulatory Vitals:   03/02/20 1315  BP: 121/66  Pulse:  80  Resp: 18  Temp: 98.8 F (37.1 C)  SpO2: 93%   Filed Weights   03/02/20 1315  Weight: 248 lb 1.6 oz (112.5 kg)    Physical Exam Constitutional:      General: He is not in acute distress.    Appearance: He is not diaphoretic.  HENT:     Head: Normocephalic and atraumatic.     Nose: Nose normal.     Mouth/Throat:     Pharynx: No oropharyngeal exudate.  Eyes:     General: No scleral icterus.    Pupils: Pupils are equal, round, and reactive to light.  Cardiovascular:     Rate and Rhythm: Normal rate and regular rhythm.     Heart sounds: No murmur heard.   Pulmonary:     Effort: Pulmonary effort is normal. No respiratory distress.     Breath sounds: No rales.  Chest:     Chest wall: No tenderness.  Abdominal:     General: There is no distension.     Palpations: Abdomen is soft.     Tenderness: There is no abdominal tenderness.  Musculoskeletal:     Cervical back: Normal range of motion and neck supple.     Comments: Back pain, range of motion limited due to the pain.  Skin:    General: Skin is warm and dry.     Findings: No erythema.  Neurological:     Mental Status: He is alert and oriented to person, place, and time.     Cranial Nerves: No cranial nerve deficit.     Motor: No abnormal muscle tone.     Coordination: Coordination normal.  Psychiatric:        Mood and Affect: Affect normal.       LABORATORY DATA:  I have reviewed the data as listed Lab Results  Component Value Date   WBC 4.6 03/02/2020   HGB 10.8 (L) 03/02/2020   HCT 32.6 (L) 03/02/2020   MCV 90.6 03/02/2020   PLT 195 03/02/2020   Recent Labs    02/04/20 1029 02/21/20 1037 03/02/20 1240  NA 136 134* 134*  K 3.6 3.3* 3.6  CL 100 98 95*  CO2 27 26 28   GLUCOSE 89 91 90  BUN 13 13 9   CREATININE 1.01 0.98 1.07  CALCIUM 11.5* 11.7* 12.0*  GFRNONAA >60 >60 >60  PROT 9.8* 9.9* 9.4*  ALBUMIN 2.7* 2.7* 2.4*  AST 22 15 13*  ALT 24 21 15   ALKPHOS 40 44 42  BILITOT 0.7 0.4 0.8  Iron/TIBC/Ferritin/ %Sat No results found for: IRON, TIBC, FERRITIN, IRONPCTSAT    RADIOGRAPHIC STUDIES: I have personally reviewed the radiological images as listed and agreed with the findings in the report. MR Cervical Spine W Wo Contrast  Result Date: 02/22/2020 CLINICAL DATA:  Multiple myeloma and back pain. Lytic disease on previous imaging. EXAM: MRI CERVICAL SPINE WITHOUT AND WITH CONTRAST TECHNIQUE: Multiplanar and multiecho pulse sequences of the cervical spine, to include the craniocervical junction and cervicothoracic junction, were obtained without and with intravenous contrast. CONTRAST:  72m GADAVIST GADOBUTROL 1 MMOL/ML IV SOLN COMPARISON:  CT 09/06/2016 FINDINGS: Alignment: Normal Vertebrae: Normal. No evidence of myeloma involvement in the cervical region. No regional fracture or lytic lesion. Cord: No cord compression or primary cord lesion. Posterior Fossa, vertebral arteries, paraspinal tissues: Negative Disc levels: No significant disc pathology. Minimal non-compressive disc bulges at C5-6 and C6-7. Minimal facet osteoarthritis. No compressive narrowing of the canal or foramina. IMPRESSION: 1. No evidence of myeloma involvement of the cervical region. 2. Minimal non-compressive disc bulges at C5-6 and C6-7. Electronically Signed   By: MNelson ChimesM.D.   On: 02/22/2020 12:30   MR Lumbar Spine W Wo Contrast  Result Date: 02/22/2020 CLINICAL DATA:  Myeloma and back pain. EXAM: MRI LUMBAR SPINE WITHOUT AND WITH CONTRAST TECHNIQUE: Multiplanar and multiecho pulse sequences of the lumbar spine were obtained without and with intravenous contrast. CONTRAST:  132mGADAVIST GADOBUTROL 1 MMOL/ML IV SOLN COMPARISON:  CT 01/29/2020 FINDINGS: Segmentation:  5 lumbar type vertebral bodies. Alignment:  Normal Vertebrae: No evidence of focal marrow space lesion or definite lytic lesion to suggest myeloma involvement. The patient does have acute or subacute minor superior endplate fractures  at T1W80nd L1. No imaging finding to suggest that these are anything other than benign osteoporotic fractures. Minor endplate deformity inferior L4 which was present on the previous CT could be old or subacute. This does not appear progressive. Conus medullaris and cauda equina: Conus extends to the L1 level. Conus and cauda equina appear normal. Paraspinal and other soft tissues: Negative Disc levels: T12-L1: Mild disc bulge.  No compressive stenosis. L1-2: Negative disc space. L2-3: Bulging of the disc. Slight indentation of the thecal sac but no compressive stenosis. L3-4: Bulging of the disc. Annular fissure. No compressive stenosis. L4-5: Central disc protrusion. Mild facet and ligamentous prominence. Stenosis of both lateral recesses with some potential to affect either L5 nerve. L5-S1: Mild disc bulge without compressive stenosis. IMPRESSION: 1. No definite evidence of myeloma involvement of the lumbar spine. 2. Minor superior endplate fractures at T1H21nd L1 which appear to be acute or subacute. These are not visible on the previous CT and therefore are new since 01/29/2020. There is no finding to suggest that these are anything other than benign osteoporotic fractures. 3. Minor inferior endplate deformity at L4 which was present on the previous CT could be old or subacute. Mild residual edema at that location which could indicate ongoing healing or relate to the endplate deformity. 4. Lumbar degenerative disc disease most notable at L4-5 where there is a central disc protrusion. Mild facet and ligamentous prominence. Mild stenosis of the lateral recesses with some potential to affect either L5 nerve. Electronically Signed   By: MaNelson Chimes.D.   On: 02/22/2020 12:36   CT BONE MARROW BIOPSY & ASPIRATION  Result Date: 02/12/2020 INDICATION: [age]ear old with abnormal serum protein electrophoresis and lytic bone lesions. Concern for plasma cell disorder. Request for bone marrow biopsy. EXAM: CT GUIDED  BONE MARROW ASPIRATES AND BIOPSY Physician: Stephan Minister. Anselm Pancoast, MD MEDICATIONS: None. ANESTHESIA/SEDATION: Fentanyl 100 mcg IV; Versed 2.0 mg IV Moderate Sedation Time:  13 minutes The patient was continuously monitored during the procedure by the interventional radiology nurse under my direct supervision. COMPLICATIONS: None immediate. PROCEDURE: The procedure was explained to the patient. The risks and benefits of the procedure were discussed and the patient's questions were addressed. Informed consent was obtained from the patient. The patient was placed prone on CT table. Images of the pelvis were obtained. The right side of back was prepped and draped in sterile fashion. The skin and right posterior ilium were anesthetized with 1% lidocaine. 11 gauge bone needle was directed into the right ilium with CT guidance. Two aspirates and one core biopsy were obtained. Bandage placed over the puncture site. IMPRESSION: CT guided bone marrow aspiration and core biopsy. Electronically Signed   By: Markus Daft M.D.   On: 02/12/2020 10:45      ASSESSMENT & PLAN:  1. Multiple myeloma not having achieved remission ([place])   2. Hypercalcemia   3. Neoplasm related pain   4. Goals of care, counseling/discussion   5. Normocytic anemia    #IgG multiple myeloma, stage II 02/27/2020, started on Revlimid 25 mg 2 weeks on 1 week off. Labs are reviewed and discussed with patient today. Proceed with Velcade weekly. Proceed with dexamethasone 20 mg IV weekly prior to Velcade. Continue aspirin 81 mg for DVT prophylaxis.  Acyclovir 400 mg twice daily for shingle prophylaxis.  #Neoplasm related pain/back pain at neck pain MRI lumbar spine with and without contrast showed no definite evidence of myeloma involvement of lumbar spine. Minor superior endplate fracture at V61 and L1 which appear to be acute or subacute.  Minor inferior endplate deformity at the L4.  Degenerative disease. MRI cervical spine showed no evidence of  myeloma involvement.  Minimal noncompressive disc bulges at C5-6 and C6-7. Avoid heavy lifting. Continue Norco 5/325 every 6 hours as needed.  Increase Xtampza ER to 18 mg every 12 hours.  #Drug-induced constipation.  Recommend patient to continue Senokot 2 tablets daily.  I recommend patient to add MiraLAX daily.  If still no bowel movement, he may use Fleet enema.   #Focal area of thickening at the GE junction.  Discussed with patient.  Establish care with gastroenterology   #Lung nodules, measures up to 10 mm on the right middle lobe.  Indeterminate.  Attention on follow-up. #Hypercalcemia, likely secondary to multiple myeloma. Proceed with Zometa today. 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  Monitor.  Supportive care measures are necessary for patient well-being and will be provided as necessary. We spent sufficient time to discuss many aspect of care, questions were answered to patient's satisfaction.  All questions were answered. The patient knows to call the clinic with any problems questions or concerns.  cc Sofie Hartigan, MD    Return of visit: 1 week  Earlie Server, MD, PhD Hematology Oncology [facility] Pager- [phone] 03/02/2020

## 2020-03-02 NOTE — Progress Notes (Signed)
Patient here for follow up. Pt started Revlimid on Thursday. No new concerns voiced.

## 2020-03-03 ENCOUNTER — Telehealth: Payer: Self-pay

## 2020-03-03 NOTE — Telephone Encounter (Signed)
T/C to pt for follow up after receiving first injection yesterday.  No answer but left message letting him know we were calling to check on him.   Encouraged pt to call for any questions or concerns.

## 2020-03-09 ENCOUNTER — Ambulatory Visit: Admission: RE | Admit: 2020-03-09 | Payer: Medicare Other | Source: Ambulatory Visit

## 2020-03-09 DIAGNOSIS — C9000 Multiple myeloma not having achieved remission: Secondary | ICD-10-CM | POA: Diagnosis not present

## 2020-03-10 ENCOUNTER — Ambulatory Visit
Admission: RE | Admit: 2020-03-10 | Discharge: 2020-03-10 | Disposition: A | Discharge status: Home / Self Care | Payer: Medicare Other | Source: Ambulatory Visit | Attending: Radiation Oncology | Admitting: Radiation Oncology

## 2020-03-10 DIAGNOSIS — C9000 Multiple myeloma not having achieved remission: Secondary | ICD-10-CM | POA: Diagnosis not present

## 2020-03-11 ENCOUNTER — Ambulatory Visit
Admission: RE | Admit: 2020-03-11 | Discharge: 2020-03-11 | Disposition: A | Discharge status: Home / Self Care | Payer: Medicare Other | Source: Ambulatory Visit | Attending: Radiation Oncology | Admitting: Radiation Oncology

## 2020-03-11 ENCOUNTER — Inpatient Hospital Stay: Payer: Medicare Other

## 2020-03-11 ENCOUNTER — Inpatient Hospital Stay: Payer: Medicare Other | Attending: Oncology

## 2020-03-11 VITALS — BP 120/64 | HR 78 | Temp 98.3°F | Resp 16 | Wt 253.6 lb

## 2020-03-11 DIAGNOSIS — Z79899 Other long term (current) drug therapy: Secondary | ICD-10-CM | POA: Diagnosis not present

## 2020-03-11 DIAGNOSIS — K5903 Drug induced constipation: Secondary | ICD-10-CM | POA: Diagnosis not present

## 2020-03-11 DIAGNOSIS — Z5111 Encounter for antineoplastic chemotherapy: Secondary | ICD-10-CM | POA: Diagnosis not present

## 2020-03-11 DIAGNOSIS — F32A Depression, unspecified: Secondary | ICD-10-CM | POA: Diagnosis not present

## 2020-03-11 DIAGNOSIS — C9000 Multiple myeloma not having achieved remission: Secondary | ICD-10-CM | POA: Insufficient documentation

## 2020-03-11 DIAGNOSIS — D649 Anemia, unspecified: Secondary | ICD-10-CM | POA: Diagnosis not present

## 2020-03-11 DIAGNOSIS — Z510 Encounter for antineoplastic radiation therapy: Secondary | ICD-10-CM | POA: Insufficient documentation

## 2020-03-11 DIAGNOSIS — F17210 Nicotine dependence, cigarettes, uncomplicated: Secondary | ICD-10-CM | POA: Insufficient documentation

## 2020-03-11 DIAGNOSIS — I10 Essential (primary) hypertension: Secondary | ICD-10-CM | POA: Diagnosis not present

## 2020-03-11 DIAGNOSIS — G893 Neoplasm related pain (acute) (chronic): Secondary | ICD-10-CM | POA: Insufficient documentation

## 2020-03-11 DIAGNOSIS — M8580 Other specified disorders of bone density and structure, unspecified site: Secondary | ICD-10-CM | POA: Insufficient documentation

## 2020-03-11 DIAGNOSIS — I700 Atherosclerosis of aorta: Secondary | ICD-10-CM | POA: Diagnosis not present

## 2020-03-11 DIAGNOSIS — R918 Other nonspecific abnormal finding of lung field: Secondary | ICD-10-CM | POA: Insufficient documentation

## 2020-03-11 DIAGNOSIS — M4854XA Collapsed vertebra, not elsewhere classified, thoracic region, initial encounter for fracture: Secondary | ICD-10-CM | POA: Insufficient documentation

## 2020-03-11 DIAGNOSIS — E785 Hyperlipidemia, unspecified: Secondary | ICD-10-CM | POA: Diagnosis not present

## 2020-03-11 LAB — COMPREHENSIVE METABOLIC PANEL
ALT: 16 U/L (ref 0–44)
AST: 14 U/L — ABNORMAL LOW (ref 15–41)
Albumin: 2.5 g/dL — ABNORMAL LOW (ref 3.5–5.0)
Alkaline Phosphatase: 42 U/L (ref 38–126)
Anion gap: 10 (ref 5–15)
BUN: 11 mg/dL (ref 8–23)
CO2: 27 mmol/L (ref 22–32)
Calcium: 10.2 mg/dL (ref 8.9–10.3)
Chloride: 94 mmol/L — ABNORMAL LOW (ref 98–111)
Creatinine, Ser: 0.88 mg/dL (ref 0.61–1.24)
GFR, Estimated: >60 mL/min (ref >60)
Glucose, Bld: 91 mg/dL (ref 70–99)
Potassium: 3.2 mmol/L — ABNORMAL LOW (ref 3.5–5.1)
Sodium: 131 mmol/L — ABNORMAL LOW (ref 135–145)
Total Bilirubin: 0.8 mg/dL (ref 0.3–1.2)
Total Protein: 9.6 g/dL — ABNORMAL HIGH (ref 6.5–8.1)

## 2020-03-11 LAB — CBC WITH DIFFERENTIAL/PLATELET
Abs Immature Granulocytes: 0.02 10*3/uL (ref 0.00–0.07)
Basophils Absolute: 0 10*3/uL (ref 0.0–0.1)
Basophils Relative: 0 %
Eosinophils Absolute: 0.5 10*3/uL (ref 0.0–0.5)
Eosinophils Relative: 13 %
HCT: 33 % — ABNORMAL LOW (ref 39.0–52.0)
Hemoglobin: 10.9 g/dL — ABNORMAL LOW (ref 13.0–17.0)
Immature Granulocytes: 1 %
Lymphocytes Relative: 28 %
Lymphs Abs: 1.2 10*3/uL (ref 0.7–4.0)
MCH: 29.8 pg (ref 26.0–34.0)
MCHC: 33 g/dL (ref 30.0–36.0)
MCV: 90.2 fL (ref 80.0–100.0)
Monocytes Absolute: 0.7 10*3/uL (ref 0.1–1.0)
Monocytes Relative: 16 %
Neutro Abs: 1.8 10*3/uL (ref 1.7–7.7)
Neutrophils Relative %: 42 %
Platelets: 213 10*3/uL (ref 150–400)
RBC: 3.66 MIL/uL — ABNORMAL LOW (ref 4.22–5.81)
RDW: 15.3 % (ref 11.5–15.5)
WBC: 4.2 10*3/uL (ref 4.0–10.5)
nRBC: 0 % (ref 0.0–0.2)

## 2020-03-11 MED ORDER — BORTEZOMIB CHEMO SQ INJECTION 3.5 MG (2.5MG/ML)
1.3000 mg/m2 | Freq: Once | INTRAMUSCULAR | Status: AC
  Administered 2020-03-11: 3.25 mg via SUBCUTANEOUS
  Filled 2020-03-11: qty 1.3

## 2020-03-11 MED ORDER — DEXAMETHASONE 4 MG PO TABS
20.0000 mg | ORAL_TABLET | Freq: Once | ORAL | Status: AC
  Administered 2020-03-11: 20 mg via ORAL
  Filled 2020-03-11: qty 5

## 2020-03-11 NOTE — Progress Notes (Signed)
Velcade well tolerated. Provider notified of K 3.2 today. Discharged in stable condition.

## 2020-03-12 ENCOUNTER — Ambulatory Visit
Admission: RE | Admit: 2020-03-12 | Discharge: 2020-03-12 | Disposition: A | Discharge status: Home / Self Care | Payer: Medicare Other | Source: Ambulatory Visit | Attending: Radiation Oncology | Admitting: Radiation Oncology

## 2020-03-12 DIAGNOSIS — C9000 Multiple myeloma not having achieved remission: Secondary | ICD-10-CM | POA: Diagnosis not present

## 2020-03-13 ENCOUNTER — Ambulatory Visit
Admission: RE | Admit: 2020-03-13 | Discharge: 2020-03-13 | Disposition: A | Discharge status: Home / Self Care | Payer: Medicare Other | Source: Ambulatory Visit | Attending: Radiation Oncology | Admitting: Radiation Oncology

## 2020-03-13 DIAGNOSIS — C9000 Multiple myeloma not having achieved remission: Secondary | ICD-10-CM | POA: Diagnosis not present

## 2020-03-16 ENCOUNTER — Ambulatory Visit
Admission: RE | Admit: 2020-03-16 | Discharge: 2020-03-16 | Disposition: A | Discharge status: Home / Self Care | Payer: Medicare Other | Source: Ambulatory Visit | Attending: Radiation Oncology | Admitting: Radiation Oncology

## 2020-03-16 DIAGNOSIS — C9000 Multiple myeloma not having achieved remission: Secondary | ICD-10-CM | POA: Diagnosis not present

## 2020-03-17 ENCOUNTER — Other Ambulatory Visit: Payer: Self-pay | Admitting: *Deleted

## 2020-03-17 ENCOUNTER — Ambulatory Visit
Admission: RE | Admit: 2020-03-17 | Discharge: 2020-03-17 | Disposition: A | Discharge status: Home / Self Care | Payer: Medicare Other | Source: Ambulatory Visit | Attending: Radiation Oncology | Admitting: Radiation Oncology

## 2020-03-17 DIAGNOSIS — C9000 Multiple myeloma not having achieved remission: Secondary | ICD-10-CM | POA: Diagnosis not present

## 2020-03-18 ENCOUNTER — Ambulatory Visit
Admission: RE | Admit: 2020-03-18 | Discharge: 2020-03-18 | Disposition: A | Discharge status: Home / Self Care | Payer: Medicare Other | Source: Ambulatory Visit | Attending: Radiation Oncology | Admitting: Radiation Oncology

## 2020-03-18 ENCOUNTER — Inpatient Hospital Stay (HOSPITAL_BASED_OUTPATIENT_CLINIC_OR_DEPARTMENT_OTHER): Payer: Medicare Other | Admitting: Oncology

## 2020-03-18 ENCOUNTER — Telehealth: Payer: Self-pay | Admitting: *Deleted

## 2020-03-18 ENCOUNTER — Inpatient Hospital Stay: Payer: Medicare Other

## 2020-03-18 ENCOUNTER — Encounter: Payer: Self-pay | Admitting: Oncology

## 2020-03-18 DIAGNOSIS — G893 Neoplasm related pain (acute) (chronic): Secondary | ICD-10-CM | POA: Diagnosis not present

## 2020-03-18 DIAGNOSIS — D649 Anemia, unspecified: Secondary | ICD-10-CM | POA: Diagnosis not present

## 2020-03-18 DIAGNOSIS — M899 Disorder of bone, unspecified: Secondary | ICD-10-CM

## 2020-03-18 DIAGNOSIS — C9000 Multiple myeloma not having achieved remission: Secondary | ICD-10-CM

## 2020-03-18 LAB — COMPREHENSIVE METABOLIC PANEL
ALT: 14 U/L (ref 0–44)
AST: 12 U/L — ABNORMAL LOW (ref 15–41)
Albumin: 2.7 g/dL — ABNORMAL LOW (ref 3.5–5.0)
Alkaline Phosphatase: 51 U/L (ref 38–126)
Anion gap: 9 (ref 5–15)
BUN: 15 mg/dL (ref 8–23)
CO2: 27 mmol/L (ref 22–32)
Calcium: 10.7 mg/dL — ABNORMAL HIGH (ref 8.9–10.3)
Chloride: 96 mmol/L — ABNORMAL LOW (ref 98–111)
Creatinine, Ser: 0.86 mg/dL (ref 0.61–1.24)
GFR, Estimated: >60 mL/min (ref >60)
Glucose, Bld: 100 mg/dL — ABNORMAL HIGH (ref 70–99)
Potassium: 3.6 mmol/L (ref 3.5–5.1)
Sodium: 132 mmol/L — ABNORMAL LOW (ref 135–145)
Total Bilirubin: 0.7 mg/dL (ref 0.3–1.2)
Total Protein: 8.8 g/dL — ABNORMAL HIGH (ref 6.5–8.1)

## 2020-03-18 LAB — CBC WITH DIFFERENTIAL/PLATELET
Abs Immature Granulocytes: 0 10*3/uL (ref 0.00–0.07)
Basophils Absolute: 0 10*3/uL (ref 0.0–0.1)
Basophils Relative: 0 %
Eosinophils Absolute: 0.1 10*3/uL (ref 0.0–0.5)
Eosinophils Relative: 3 %
HCT: 34.1 % — ABNORMAL LOW (ref 39.0–52.0)
Hemoglobin: 11.3 g/dL — ABNORMAL LOW (ref 13.0–17.0)
Immature Granulocytes: 0 %
Lymphocytes Relative: 39 %
Lymphs Abs: 1.1 10*3/uL (ref 0.7–4.0)
MCH: 30.1 pg (ref 26.0–34.0)
MCHC: 33.1 g/dL (ref 30.0–36.0)
MCV: 90.7 fL (ref 80.0–100.0)
Monocytes Absolute: 0.6 10*3/uL (ref 0.1–1.0)
Monocytes Relative: 20 %
Neutro Abs: 1.1 10*3/uL — ABNORMAL LOW (ref 1.7–7.7)
Neutrophils Relative %: 38 %
Platelets: 164 10*3/uL (ref 150–400)
RBC: 3.76 MIL/uL — ABNORMAL LOW (ref 4.22–5.81)
RDW: 15.6 % — ABNORMAL HIGH (ref 11.5–15.5)
WBC: 2.8 10*3/uL — ABNORMAL LOW (ref 4.0–10.5)
nRBC: 0 % (ref 0.0–0.2)

## 2020-03-18 MED ORDER — HYDROCODONE-ACETAMINOPHEN 5-325 MG PO TABS: 1.0000 | ORAL_TABLET | Freq: Four times a day (QID) | ORAL | 0 refills | Status: DC | PRN

## 2020-03-18 MED ORDER — SODIUM CHLORIDE 0.9 % IV SOLN
Freq: Once | INTRAVENOUS | Status: AC
  Filled 2020-03-18: qty 250

## 2020-03-18 MED ORDER — LENALIDOMIDE 25 MG PO CAPS: 25.0000 mg | ORAL_CAPSULE | Freq: Every day | ORAL | 0 refills | Status: DC

## 2020-03-18 MED ORDER — ZOLEDRONIC ACID 4 MG/100ML IV SOLN
4.0000 mg | Freq: Once | INTRAVENOUS | Status: AC
  Administered 2020-03-18: 4 mg via INTRAVENOUS
  Filled 2020-03-18: qty 100

## 2020-03-18 MED ORDER — XTAMPZA ER 18 MG PO C12A: 18.0000 mg | EXTENDED_RELEASE_CAPSULE | Freq: Two times a day (BID) | ORAL | 0 refills | Status: DC

## 2020-03-18 MED ORDER — DEXAMETHASONE 4 MG PO TABS
20.0000 mg | ORAL_TABLET | Freq: Once | ORAL | Status: AC
  Administered 2020-03-18: 20 mg via ORAL
  Filled 2020-03-18: qty 5

## 2020-03-18 MED ORDER — BORTEZOMIB CHEMO SQ INJECTION 3.5 MG (2.5MG/ML)
1.3000 mg/m2 | Freq: Once | INTRAMUSCULAR | Status: AC
  Administered 2020-03-18: 3.25 mg via SUBCUTANEOUS
  Filled 2020-03-18: qty 1.3

## 2020-03-18 MED ORDER — OMEPRAZOLE 20 MG PO CPDR: 20.0000 mg | DELAYED_RELEASE_CAPSULE | Freq: Every day | ORAL | 1 refills | Status: DC

## 2020-03-18 NOTE — Progress Notes (Signed)
Patient here for follow up. Reports that he feels like he has something stuck in his throat, making him cough and have a hard time swallowing

## 2020-03-18 NOTE — Telephone Encounter (Signed)
Pt is to pick up Omeprazole at his local pharmacy. Revlimid will be be delivered by specialty pharmacy.

## 2020-03-18 NOTE — Telephone Encounter (Signed)
Patient called to find out if his Revlimid is going to be delivered. His AVS states the drug is for pick up.

## 2020-03-18 NOTE — Telephone Encounter (Signed)
Left message for patient to pick up omeprazole and that the Revlimid would be delivered per E Frederico Hamman RN.

## 2020-03-18 NOTE — Progress Notes (Signed)
1220: last Zometa received 03/02/20, per Dr. Tasia Catchings pt to receive Zometa at this time due to elevated calcium, pt to also receive Velcade treatment as scheduled.   1305: Pt tolerated treatment well. Pt stable at discharge.

## 2020-03-18 NOTE — Progress Notes (Signed)
Hematology/Oncology follow up note [facility] [phone] [phone]   Patient Care Team: Sofie Hartigan, MD as PCP - General (Family Medicine) Noreene Filbert, MD as Radiation Oncologist (Radiation Oncology)  REFERRING PROVIDER: Sofie Hartigan, MD  CHIEF COMPLAINTS/REASON FOR VISIT:  Follow-up for multiple myeloma HISTORY OF PRESENTING ILLNESS:   Tommy Smith. is a  64 y.o.  male with PMH listed below was seen in consultation at the request of  Sofie Hartigan, MD  for evaluation of abnormal serum protein electrophoresis.  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  Increase calcium level at 12.3, PTH was normal.  Creatinine 1, estimated GFR 91, CBC showed hemoglobin 12.4, Patient also recently had a CT chest abdomen pelvis with contrast for evaluation of right flank pain and right lower lobe nodule. 01/29/2020, CT showed multiple bilateral pulmonary nodules measuring up to 10 mm in the right middle lobe, nodules are indeterminate, infectious/inflammatory etiology versus metastatic disease. Multiple osseous lytic lesions, largest lesion involving the left pubic bone concerning for metastatic disease. Compression fractures at T9, age indeterminate. Appearance focal area of thickening at the gastroesophageal junction.  Further evaluation with upper GI study or direct visualization with endoscopy is recommended No bowel obstruction. Aortic atherosclerosis.  Patient reports NSAIDs as needed for lower back pain.  Mid lower back pain usually is worse when when he stands up from sitting position.  Patient works in the [employer].  Lives at home with wife.  Denies any unintentional weight loss, fever, chills, night sweating  # IgG multiple myeloma, stage II Baseline M protein 4.1, kappa light chain level 333.8 Beta-2 microglobulin 2.1, albumin 2.7. 02/12/2020, bone marrow biopsy showed  hypercellular for age, 80% plasma cell which are complex restricted by light chain in situ heparinization.  Background hematopoiesis is present but reduced. Cytogenetics is normal, Myeloma FISH panel-deletion 1P,Standard risk.  # 02/27/2020 patient has been started on Revlimid 25 mg D1-14 #02/24/2020, Velcade weekly with dexamethasone 20 mg weekly INTERVAL HISTORY Tommy Smith. is a 65 y.o. male who has above history reviewed by me today presents for follow up visit for  New diagnosis multiple myeloma Problems and complaints are listed below: Patient is on palliative radiation of spine.  Back pain has improved, controlled with current regimen. Constipation has improved and manageable with current bowel regimen Reports feedings of throat irritation, which makes him cough and a hard time swallowing. Denies any fever, chills.  Cough is nonproductive.  No shortness of breath. Some heartburn, he takes Tums as needed.  Review of Systems  Constitutional: Positive for fatigue. Negative for appetite change, chills, fever and unexpected weight change.  HENT:   Negative for hearing loss and voice change.        Throat irritation  Eyes: Negative for eye problems and icterus.  Respiratory: Negative for chest tightness, cough and shortness of breath.   Cardiovascular: Negative for chest pain and leg swelling.  Gastrointestinal: Negative for abdominal distention and abdominal pain.       Heartburn  Endocrine: Negative for hot flashes.  Genitourinary: Negative for difficulty urinating, dysuria and frequency.   Musculoskeletal: Positive for back pain and neck pain. Negative for arthralgias.  Skin: Negative for itching and rash.  Neurological: Negative for light-headedness and numbness.  Hematological: Negative for adenopathy. Does not bruise/bleed easily.  Psychiatric/Behavioral: Negative for confusion.     MEDICAL HISTORY:  Past Medical History:  Diagnosis Date  . Depression   .  Hypercalcemia 03/02/2020  .  Hyperlipemia   . Hypertension   . Hypogonadism in male     SURGICAL HISTORY: Past Surgical History:  Procedure Laterality Date  . COLONOSCOPY  07/14/2008    SOCIAL HISTORY: Social History   Socioeconomic History  . Marital status: Married    Spouse name: Not on file  . Number of children: Not on file  . Years of education: Not on file  . Highest education level: Not on file  Occupational History  . Not on file  Tobacco Use  . Smoking status: Current Every Day Smoker    Packs/day: 1.00    Years: 30.00    Pack years: 30.00    Types: Cigarettes  . Smokeless tobacco: Never Used  Substance and Sexual Activity  . Alcohol use: Yes    Comment: occcassional  . Drug use: No  . Sexual activity: Not on file  Other Topics Concern  . Not on file  Social History Narrative  . Not on file   Social Determinants of Health   Financial Resource Strain:   . Difficulty of Paying Living Expenses: Not on file  Food Insecurity:   . Worried About Charity fundraiser in the Last Year: Not on file  . Ran Out of Food in the Last Year: Not on file  Transportation Needs:   . Lack of Transportation (Medical): Not on file  . Lack of Transportation (Non-Medical): Not on file  Physical Activity:   . Days of Exercise per Week: Not on file  . Minutes of Exercise per Session: Not on file  Stress:   . Feeling of Stress : Not on file  Social Connections:   . Frequency of Communication with Friends and Family: Not on file  . Frequency of Social Gatherings with Friends and Family: Not on file  . Attends Religious Services: Not on file  . Active Member of Clubs or Organizations: Not on file  . Attends Archivist Meetings: Not on file  . Marital Status: Not on file  Intimate Partner Violence:   . Fear of Current or Ex-Partner: Not on file  . Emotionally Abused: Not on file  . Physically Abused: Not on file  . Sexually Abused: Not on file    FAMILY  HISTORY: Family History  Problem Relation Age of Onset  . Skin cancer Mother   . Prostate cancer Father     ALLERGIES:  has No Known Allergies.  MEDICATIONS:  Current Outpatient Medications  Medication Sig Dispense Refill  . acyclovir (ZOVIRAX) 400 MG tablet Take 1 tablet (400 mg total) by mouth 2 (two) times daily. 60 tablet 3  . amLODipine (NORVASC) 10 MG tablet Take by mouth.    . ASPIRIN 81 PO Take 81 mg by mouth daily.    Marland Kitchen atorvastatin (LIPITOR) 40 MG tablet Take 40 mg by mouth daily.    Marland Kitchen buPROPion (WELLBUTRIN XL) 150 MG 24 hr tablet Take by mouth.    . dutasteride (AVODART) 0.5 MG capsule Take by mouth.    . gabapentin (NEURONTIN) 300 MG capsule Take 1 capsule (300 mg total) by mouth daily. 30 capsule 0  . HYDROcodone-acetaminophen (NORCO) 5-325 MG tablet Take 1 tablet by mouth every 6 (six) hours as needed for moderate pain. 30 tablet 0  . losartan-hydrochlorothiazide (HYZAAR) 100-12.5 MG tablet Take 1 tablet by mouth daily.    . ondansetron (ZOFRAN) 8 MG tablet Take 1 tablet (8 mg total) by mouth 2 (two) times daily as needed (Nausea or vomiting). 30 tablet  1  . oxyCODONE ER (XTAMPZA ER) 18 MG C12A Take 18 mg by mouth every 12 (twelve) hours. 30 capsule 0  . polyethylene glycol (MIRALAX) 17 g packet Take 17 g by mouth daily. 14 each 0  . Psyllium (METAMUCIL MULTIHEALTH FIBER PO) Take by mouth daily.    Marland Kitchen senna-docusate (SENOKOT-S) 8.6-50 MG tablet Take 2 tablets by mouth daily. 60 tablet 0  . lenalidomide (REVLIMID) 25 MG capsule Take 1 capsule (25 mg total) by mouth daily. Take 14 days on, 7 days off, repeat every 21 days. 14 capsule 0  . omeprazole (PRILOSEC) 20 MG capsule Take 1 capsule (20 mg total) by mouth daily. 30 capsule 1   No current facility-administered medications for this visit.     PHYSICAL EXAMINATION: ECOG PERFORMANCE STATUS: 1 - Symptomatic but completely ambulatory Vitals:   03/18/20 1123  BP: 129/79  Pulse: 79  Resp: 18  Temp: 98.8 F (37.1 C)    Filed Weights   03/18/20 1123  Weight: 246 lb 1.6 oz (111.6 kg)    Physical Exam Constitutional:      General: He is not in acute distress.    Appearance: He is not diaphoretic.  HENT:     Head: Normocephalic and atraumatic.     Nose: Nose normal.     Mouth/Throat:     Pharynx: No oropharyngeal exudate.  Eyes:     General: No scleral icterus.    Pupils: Pupils are equal, round, and reactive to light.  Cardiovascular:     Rate and Rhythm: Normal rate and regular rhythm.     Heart sounds: No murmur heard.   Pulmonary:     Effort: Pulmonary effort is normal. No respiratory distress.     Breath sounds: No rales.  Chest:     Chest wall: No tenderness.  Abdominal:     General: There is no distension.     Palpations: Abdomen is soft.     Tenderness: There is no abdominal tenderness.  Musculoskeletal:     Cervical back: Normal range of motion and neck supple.     Comments: Back pain, range of motion limited due to the pain.  Skin:    General: Skin is warm and dry.     Findings: No erythema.  Neurological:     Mental Status: He is alert and oriented to person, place, and time.     Cranial Nerves: No cranial nerve deficit.     Motor: No abnormal muscle tone.     Coordination: Coordination normal.  Psychiatric:        Mood and Affect: Affect normal.       LABORATORY DATA:  I have reviewed the data as listed Lab Results  Component Value Date   WBC 2.8 (L) 03/18/2020   HGB 11.3 (L) 03/18/2020   HCT 34.1 (L) 03/18/2020   MCV 90.7 03/18/2020   PLT 164 03/18/2020   Recent Labs    03/02/20 1240 03/11/20 1001 03/18/20 0956  NA 134* 131* 132*  K 3.6 3.2* 3.6  CL 95* 94* 96*  CO2 28 27 27   GLUCOSE 90 91 100*  BUN 9 11 15   CREATININE 1.07 0.88 0.86  CALCIUM 12.0* 10.2 10.7*  GFRNONAA >60 >60 >60  PROT 9.4* 9.6* 8.8*  ALBUMIN 2.4* 2.5* 2.7*  AST 13* 14* 12*  ALT 15 16 14   ALKPHOS 42 42 51  BILITOT 0.8 0.8 0.7   Iron/TIBC/Ferritin/ %Sat No results found  for: IRON, TIBC, FERRITIN, IRONPCTSAT  RADIOGRAPHIC STUDIES: I have personally reviewed the radiological images as listed and agreed with the findings in the report. MR Cervical Spine W Wo Contrast  Result Date: 02/22/2020 CLINICAL DATA:  Multiple myeloma and back pain. Lytic disease on previous imaging. EXAM: MRI CERVICAL SPINE WITHOUT AND WITH CONTRAST TECHNIQUE: Multiplanar and multiecho pulse sequences of the cervical spine, to include the craniocervical junction and cervicothoracic junction, were obtained without and with intravenous contrast. CONTRAST:  74m GADAVIST GADOBUTROL 1 MMOL/ML IV SOLN COMPARISON:  CT 09/06/2016 FINDINGS: Alignment: Normal Vertebrae: Normal. No evidence of myeloma involvement in the cervical region. No regional fracture or lytic lesion. Cord: No cord compression or primary cord lesion. Posterior Fossa, vertebral arteries, paraspinal tissues: Negative Disc levels: No significant disc pathology. Minimal non-compressive disc bulges at C5-6 and C6-7. Minimal facet osteoarthritis. No compressive narrowing of the canal or foramina. IMPRESSION: 1. No evidence of myeloma involvement of the cervical region. 2. Minimal non-compressive disc bulges at C5-6 and C6-7. Electronically Signed   By: MNelson ChimesM.D.   On: 02/22/2020 12:30   MR Lumbar Spine W Wo Contrast  Result Date: 02/22/2020 CLINICAL DATA:  Myeloma and back pain. EXAM: MRI LUMBAR SPINE WITHOUT AND WITH CONTRAST TECHNIQUE: Multiplanar and multiecho pulse sequences of the lumbar spine were obtained without and with intravenous contrast. CONTRAST:  128mGADAVIST GADOBUTROL 1 MMOL/ML IV SOLN COMPARISON:  CT 01/29/2020 FINDINGS: Segmentation:  5 lumbar type vertebral bodies. Alignment:  Normal Vertebrae: No evidence of focal marrow space lesion or definite lytic lesion to suggest myeloma involvement. The patient does have acute or subacute minor superior endplate fractures at T1P71nd L1. No imaging finding to suggest  that these are anything other than benign osteoporotic fractures. Minor endplate deformity inferior L4 which was present on the previous CT could be old or subacute. This does not appear progressive. Conus medullaris and cauda equina: Conus extends to the L1 level. Conus and cauda equina appear normal. Paraspinal and other soft tissues: Negative Disc levels: T12-L1: Mild disc bulge.  No compressive stenosis. L1-2: Negative disc space. L2-3: Bulging of the disc. Slight indentation of the thecal sac but no compressive stenosis. L3-4: Bulging of the disc. Annular fissure. No compressive stenosis. L4-5: Central disc protrusion. Mild facet and ligamentous prominence. Stenosis of both lateral recesses with some potential to affect either L5 nerve. L5-S1: Mild disc bulge without compressive stenosis. IMPRESSION: 1. No definite evidence of myeloma involvement of the lumbar spine. 2. Minor superior endplate fractures at T1G62nd L1 which appear to be acute or subacute. These are not visible on the previous CT and therefore are new since 01/29/2020. There is no finding to suggest that these are anything other than benign osteoporotic fractures. 3. Minor inferior endplate deformity at L4 which was present on the previous CT could be old or subacute. Mild residual edema at that location which could indicate ongoing healing or relate to the endplate deformity. 4. Lumbar degenerative disc disease most notable at L4-5 where there is a central disc protrusion. Mild facet and ligamentous prominence. Mild stenosis of the lateral recesses with some potential to affect either L5 nerve. Electronically Signed   By: MaNelson Chimes.D.   On: 02/22/2020 12:36      ASSESSMENT & PLAN:  1. Hypercalcemia   2. Multiple myel[place] not having achieved remission ([place]  3. Neoplasm related pain   4. Normocytic anemia   5. Lytic bone lesions on xray    #IgG multiple myeloma, stage II 02/27/2020,  started on Revlimid 25 mg 2 weeks on 1 week  off. Currently he is on off week.  Recommend patient to restart Revlimid when he gets the next cycle supply. Labs are reviewed and discussed with patient Counts acceptable to proceed with Velcade with dexamethasone today. Continue aspirin 81 mg for DVT prophylaxis and acyclovir 400 mg twice daily for shingle prophylaxis  #Neoplasm related pain, continue current regimen.  MRI lumbar spine with and without contrast showed no definitive evidence of myeloma.  Compression fracture. Avoid heavy lifting. Continue Norco 5/325 every 6 hours as needed.  Continue Xtampza ER to 18 mg every 12 hours.  #Drug-induced constipation.  Recommend patient to continue Senokot 2 tablets daily.  I recommend patient to add MiraLAX daily.  If still no bowel movement, he may use Fleet enema.   #Focal area of thickening at the GE junction.  Discussed with patient.  Establish care with gastroenterology   #Lung nodules, measures up to 10 mm on the right middle lobe.  Indeterminate.  Attention on follow-up. #Hypercalcemia, likely secondary to multiple myeloma.  Status post 1 dose of Zometa on 03/02/2020. Today's corrected calcium is above 12.  Proceed with a repeat dose of Zometa  #Normocytic anemia, likely secondary to myeloma as well as chemotherapy.  Hemoglobin stable.  Supportive care measures are necessary for patient well-being and will be provided as necessary. We spent sufficient time to discuss many aspect of care, questions were answered to patient's satisfaction.  All questions were answered. The patient knows to call the clinic with any problems questions or concerns.  cc Sofie Hartigan, MD    Return of visit: 1 week  Earlie Server, MD, PhD Hematology Oncology [facility] Pager- [phone] 03/18/2020

## 2020-03-18 NOTE — Progress Notes (Signed)
Giving Zometa for hypercalcemia.  Last dose 11/22.  Per MD ok to repeat and will document note

## 2020-03-19 ENCOUNTER — Ambulatory Visit
Admission: RE | Admit: 2020-03-19 | Discharge: 2020-03-19 | Disposition: A | Discharge status: Home / Self Care | Payer: Medicare Other | Source: Ambulatory Visit | Attending: Radiation Oncology | Admitting: Radiation Oncology

## 2020-03-19 ENCOUNTER — Telehealth: Payer: Self-pay | Admitting: Oncology

## 2020-03-19 DIAGNOSIS — C9000 Multiple myeloma not having achieved remission: Secondary | ICD-10-CM | POA: Diagnosis not present

## 2020-03-19 NOTE — Telephone Encounter (Signed)
Pt presented to scheduling about the telephone note from yesterday.  I informed the patient that his omeprazole prescription needs to be picked up at the pharmacy and his Revlimid prescription would be delivered.  Patient states understanding.  He also wanted the team to know that he no longer needs pain medication and will just take Tylenol per the conversation with Dr. Tasia Catchings yesterday.

## 2020-03-20 ENCOUNTER — Ambulatory Visit
Admission: RE | Admit: 2020-03-20 | Discharge: 2020-03-20 | Disposition: A | Discharge status: Home / Self Care | Payer: Medicare Other | Source: Ambulatory Visit | Attending: Radiation Oncology | Admitting: Radiation Oncology

## 2020-03-20 DIAGNOSIS — C9000 Multiple myeloma not having achieved remission: Secondary | ICD-10-CM | POA: Diagnosis not present

## 2020-03-23 ENCOUNTER — Ambulatory Visit
Admission: RE | Admit: 2020-03-23 | Discharge: 2020-03-23 | Disposition: A | Discharge status: Home / Self Care | Payer: Medicare Other | Source: Ambulatory Visit | Attending: Radiation Oncology | Admitting: Radiation Oncology

## 2020-03-23 DIAGNOSIS — C9000 Multiple myeloma not having achieved remission: Secondary | ICD-10-CM | POA: Diagnosis not present

## 2020-03-25 ENCOUNTER — Inpatient Hospital Stay: Payer: Medicare Other

## 2020-03-25 ENCOUNTER — Inpatient Hospital Stay (HOSPITAL_BASED_OUTPATIENT_CLINIC_OR_DEPARTMENT_OTHER): Payer: Medicare Other | Admitting: Oncology

## 2020-03-25 ENCOUNTER — Other Ambulatory Visit: Payer: Self-pay

## 2020-03-25 ENCOUNTER — Encounter: Payer: Self-pay | Admitting: Oncology

## 2020-03-25 VITALS — BP 94/71 | HR 96 | Temp 99.0°F | Resp 18 | Wt 236.1 lb

## 2020-03-25 DIAGNOSIS — S22070A Wedge compression fracture of T9-T10 vertebra, initial encounter for closed fracture: Secondary | ICD-10-CM | POA: Diagnosis not present

## 2020-03-25 DIAGNOSIS — C9000 Multiple myeloma not having achieved remission: Secondary | ICD-10-CM

## 2020-03-25 DIAGNOSIS — E876 Hypokalemia: Secondary | ICD-10-CM | POA: Insufficient documentation

## 2020-03-25 DIAGNOSIS — Z5111 Encounter for antineoplastic chemotherapy: Secondary | ICD-10-CM | POA: Insufficient documentation

## 2020-03-25 DIAGNOSIS — G893 Neoplasm related pain (acute) (chronic): Secondary | ICD-10-CM

## 2020-03-25 DIAGNOSIS — R918 Other nonspecific abnormal finding of lung field: Secondary | ICD-10-CM | POA: Insufficient documentation

## 2020-03-25 DIAGNOSIS — D649 Anemia, unspecified: Secondary | ICD-10-CM

## 2020-03-25 LAB — COMPREHENSIVE METABOLIC PANEL
ALT: 34 U/L (ref 0–44)
AST: 21 U/L (ref 15–41)
Albumin: 3.1 g/dL — ABNORMAL LOW (ref 3.5–5.0)
Alkaline Phosphatase: 61 U/L (ref 38–126)
Anion gap: 10 (ref 5–15)
BUN: 22 mg/dL (ref 8–23)
CO2: 28 mmol/L (ref 22–32)
Calcium: 10.6 mg/dL — ABNORMAL HIGH (ref 8.9–10.3)
Chloride: 98 mmol/L (ref 98–111)
Creatinine, Ser: 0.96 mg/dL (ref 0.61–1.24)
GFR, Estimated: >60 mL/min (ref >60)
Glucose, Bld: 108 mg/dL — ABNORMAL HIGH (ref 70–99)
Potassium: 2.8 mmol/L — ABNORMAL LOW (ref 3.5–5.1)
Sodium: 136 mmol/L (ref 135–145)
Total Bilirubin: 1 mg/dL (ref 0.3–1.2)
Total Protein: 8.8 g/dL — ABNORMAL HIGH (ref 6.5–8.1)

## 2020-03-25 LAB — CBC WITH DIFFERENTIAL/PLATELET
Abs Immature Granulocytes: 0.01 10*3/uL (ref 0.00–0.07)
Basophils Absolute: 0 10*3/uL (ref 0.0–0.1)
Basophils Relative: 0 %
Eosinophils Absolute: 0.4 10*3/uL (ref 0.0–0.5)
Eosinophils Relative: 13 %
HCT: 37.2 % — ABNORMAL LOW (ref 39.0–52.0)
Hemoglobin: 12.2 g/dL — ABNORMAL LOW (ref 13.0–17.0)
Immature Granulocytes: 0 %
Lymphocytes Relative: 23 %
Lymphs Abs: 0.8 10*3/uL (ref 0.7–4.0)
MCH: 30 pg (ref 26.0–34.0)
MCHC: 32.8 g/dL (ref 30.0–36.0)
MCV: 91.6 fL (ref 80.0–100.0)
Monocytes Absolute: 0.4 10*3/uL (ref 0.1–1.0)
Monocytes Relative: 13 %
Neutro Abs: 1.6 10*3/uL — ABNORMAL LOW (ref 1.7–7.7)
Neutrophils Relative %: 51 %
Platelets: 133 10*3/uL — ABNORMAL LOW (ref 150–400)
RBC: 4.06 MIL/uL — ABNORMAL LOW (ref 4.22–5.81)
RDW: 16 % — ABNORMAL HIGH (ref 11.5–15.5)
WBC: 3.3 10*3/uL — ABNORMAL LOW (ref 4.0–10.5)
nRBC: 0 % (ref 0.0–0.2)

## 2020-03-25 MED ORDER — SODIUM CHLORIDE 0.9 % IV SOLN
40.0000 meq | Freq: Once | INTRAVENOUS | Status: AC
  Administered 2020-03-25: 12:00:00 40 meq via INTRAVENOUS
  Filled 2020-03-25: qty 20

## 2020-03-25 MED ORDER — POTASSIUM CHLORIDE CRYS ER 20 MEQ PO TBCR: 20.0000 meq | EXTENDED_RELEASE_TABLET | Freq: Every day | ORAL | 0 refills | Status: DC

## 2020-03-25 MED ORDER — BORTEZOMIB CHEMO SQ INJECTION 3.5 MG (2.5MG/ML)
1.3000 mg/m2 | Freq: Once | INTRAMUSCULAR | Status: AC
  Administered 2020-03-25: 13:00:00 3.25 mg via SUBCUTANEOUS
  Filled 2020-03-25: qty 1.3

## 2020-03-25 MED ORDER — DEXAMETHASONE 4 MG PO TABS
20.0000 mg | ORAL_TABLET | Freq: Once | ORAL | Status: AC
  Administered 2020-03-25: 12:00:00 20 mg via ORAL
  Filled 2020-03-25: qty 5

## 2020-03-25 NOTE — Progress Notes (Signed)
Patient here for follow up. Pt reports pain to ribcage and trouble swallowing thin liquids such as water.

## 2020-03-25 NOTE — Progress Notes (Signed)
SQ Velcade well tolerated. K 2.8, 70meq IV KCl given per orders. Discharged home in stable condition.

## 2020-03-25 NOTE — Progress Notes (Signed)
Hematology/Oncology follow up note [facility] [phone] [phone]   Patient Care Team: Sofie Hartigan, MD as PCP - General (Family Medicine) Noreene Filbert, MD as Radiation Oncologist (Radiation Oncology)  REFERRING PROVIDER: Sofie Hartigan, MD  CHIEF COMPLAINTS/REASON FOR VISIT:  Follow-up for multiple myeloma HISTORY OF PRESENTING ILLNESS:   Tommy Smith. is a  64 y.o.  male with PMH listed below was seen in consultation at the request of  Sofie Hartigan, MD  for evaluation of abnormal serum protein electrophoresis.  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  Increase calcium level at 12.3, PTH was normal.  Creatinine 1, estimated GFR 91, CBC showed hemoglobin 12.4, Patient also recently had a CT chest abdomen pelvis with contrast for evaluation of right flank pain and right lower lobe nodule. 01/29/2020, CT showed multiple bilateral pulmonary nodules measuring up to 10 mm in the right middle lobe, nodules are indeterminate, infectious/inflammatory etiology versus metastatic disease. Multiple osseous lytic lesions, largest lesion involving the left pubic bone concerning for metastatic disease. Compression fractures at T9, age indeterminate. Appearance focal area of thickening at the gastroesophageal junction.  Further evaluation with upper GI study or direct visualization with endoscopy is recommended No bowel obstruction. Aortic atherosclerosis.  Patient reports NSAIDs as needed for lower back pain.  Mid lower back pain usually is worse when when he stands up from sitting position.  Patient works in the [employer].  Lives at home with wife.  Denies any unintentional weight loss, fever, chills, night sweating  # IgG multiple myeloma, stage II Baseline M protein 4.1, kappa light chain level 333.8 Beta-2 microglobulin 2.1, albumin 2.7. 02/12/2020, bone marrow biopsy showed  hypercellular for age, 80% plasma cell which are complex restricted by light chain in situ heparinization.  Background hematopoiesis is present but reduced. Cytogenetics is normal, Myeloma FISH panel-deletion 1P,Standard risk.  # 02/27/2020 patient has been started on Revlimid 25 mg D1-14 #02/24/2020, Velcade weekly with dexamethasone 20 mg weekly INTERVAL HISTORY Tommy Smith. is a 64 y.o. male who has above history reviewed by me today presents for follow up visit for  New diagnosis multiple myeloma Problems and complaints are listed below: Finished palliative radiation to his spine.  Back pain has significantly improved.  Is no longer taking pain medication.  Occasionally he takes Tylenol as needed. Today he has no new complaints.  He has questions whether if he can take vita fusion.   Review of Systems  Constitutional: Positive for fatigue. Negative for appetite change, chills, fever and unexpected weight change.  HENT:   Negative for hearing loss and voice change.        Throat irritation  Eyes: Negative for eye problems and icterus.  Respiratory: Negative for chest tightness, cough and shortness of breath.   Cardiovascular: Negative for chest pain and leg swelling.  Gastrointestinal: Negative for abdominal distention and abdominal pain.  Endocrine: Negative for hot flashes.  Genitourinary: Negative for difficulty urinating, dysuria and frequency.   Musculoskeletal: Negative for arthralgias, back pain and neck pain.  Skin: Negative for itching and rash.  Neurological: Negative for light-headedness and numbness.  Hematological: Negative for adenopathy. Does not bruise/bleed easily.  Psychiatric/Behavioral: Negative for confusion.     MEDICAL HISTORY:  Past Medical History:  Diagnosis Date  . Depression   . Hypercalcemia 03/02/2020  . Hyperlipemia   . Hypertension   . Hypogonadism in male     SURGICAL HISTORY: Past Surgical History:  Procedure Laterality  Date  .  COLONOSCOPY  07/14/2008    SOCIAL HISTORY: Social History   Socioeconomic History  . Marital status: Married    Spouse name: Not on file  . Number of children: Not on file  . Years of education: Not on file  . Highest education level: Not on file  Occupational History  . Not on file  Tobacco Use  . Smoking status: Current Every Day Smoker    Packs/day: 1.00    Years: 30.00    Pack years: 30.00    Types: Cigarettes  . Smokeless tobacco: Never Used  Substance and Sexual Activity  . Alcohol use: Yes    Comment: occcassional  . Drug use: No  . Sexual activity: Not on file  Other Topics Concern  . Not on file  Social History Narrative  . Not on file   Social Determinants of Health   Financial Resource Strain: Not on file  Food Insecurity: Not on file  Transportation Needs: Not on file  Physical Activity: Not on file  Stress: Not on file  Social Connections: Not on file  Intimate Partner Violence: Not on file    FAMILY HISTORY: Family History  Problem Relation Age of Onset  . Skin cancer Mother   . Prostate cancer Father     ALLERGIES:  has No Known Allergies.  MEDICATIONS:  Current Outpatient Medications  Medication Sig Dispense Refill  . acyclovir (ZOVIRAX) 400 MG tablet Take 1 tablet (400 mg total) by mouth 2 (two) times daily. 60 tablet 3  . amLODipine (NORVASC) 10 MG tablet Take by mouth.    . ASPIRIN 81 PO Take 81 mg by mouth daily.    Marland Kitchen atorvastatin (LIPITOR) 40 MG tablet Take 40 mg by mouth daily.    Marland Kitchen buPROPion (WELLBUTRIN XL) 150 MG 24 hr tablet Take by mouth.    . dutasteride (AVODART) 0.5 MG capsule Take by mouth.    . lenalidomide (REVLIMID) 25 MG capsule Take 1 capsule (25 mg total) by mouth daily. Take 14 days on, 7 days off, repeat every 21 days. 14 capsule 0  . losartan-hydrochlorothiazide (HYZAAR) 100-12.5 MG tablet Take 1 tablet by mouth daily.    Marland Kitchen omeprazole (PRILOSEC) 20 MG capsule Take 1 capsule (20 mg total) by mouth daily. 30 capsule 1   . ondansetron (ZOFRAN) 8 MG tablet Take 1 tablet (8 mg total) by mouth 2 (two) times daily as needed (Nausea or vomiting). 30 tablet 1  . polyethylene glycol (MIRALAX) 17 g packet Take 17 g by mouth daily. 14 each 0  . Psyllium (METAMUCIL MULTIHEALTH FIBER PO) Take by mouth daily.    Marland Kitchen gabapentin (NEURONTIN) 300 MG capsule Take 1 capsule (300 mg total) by mouth daily. 30 capsule 0  . HYDROcodone-acetaminophen (NORCO) 5-325 MG tablet Take 1 tablet by mouth every 6 (six) hours as needed for moderate pain. 14 tablet 0  . oxyCODONE ER (XTAMPZA ER) 18 MG C12A Take 18 mg by mouth every 12 (twelve) hours. 30 capsule 0  . potassium chloride SA (KLOR-CON) 20 MEQ tablet Take 1 tablet (20 mEq total) by mouth daily. 7 tablet 0  . senna-docusate (SENOKOT-S) 8.6-50 MG tablet Take 2 tablets by mouth daily. (Patient not taking: Reported on 03/25/2020) 60 tablet 0   No current facility-administered medications for this visit.     PHYSICAL EXAMINATION: ECOG PERFORMANCE STATUS: 1 - Symptomatic but completely ambulatory Vitals:   03/25/20 1124  BP: 94/71  Pulse: 96  Resp: 18  Temp: 99  F (37.2 C)   Filed Weights   03/25/20 1124  Weight: 236 lb 1.6 oz (107.1 kg)    Physical Exam Constitutional:      General: He is not in acute distress.    Appearance: He is not diaphoretic.  HENT:     Head: Normocephalic and atraumatic.     Nose: Nose normal.     Mouth/Throat:     Pharynx: No oropharyngeal exudate.  Eyes:     General: No scleral icterus.    Pupils: Pupils are equal, round, and reactive to light.  Cardiovascular:     Rate and Rhythm: Normal rate and regular rhythm.     Heart sounds: No murmur heard.   Pulmonary:     Effort: Pulmonary effort is normal. No respiratory distress.     Breath sounds: No rales.  Chest:     Chest wall: No tenderness.  Abdominal:     General: There is no distension.     Palpations: Abdomen is soft.     Tenderness: There is no abdominal tenderness.   Musculoskeletal:        General: Normal range of motion.     Cervical back: Normal range of motion and neck supple.  Skin:    General: Skin is warm and dry.     Findings: No erythema.  Neurological:     Mental Status: He is alert and oriented to person, place, and time.     Cranial Nerves: No cranial nerve deficit.     Motor: No abnormal muscle tone.     Coordination: Coordination normal.  Psychiatric:        Mood and Affect: Affect normal.       LABORATORY DATA:  I have reviewed the data as listed Lab Results  Component Value Date   WBC 3.3 (L) 03/25/2020   HGB 12.2 (L) 03/25/2020   HCT 37.2 (L) 03/25/2020   MCV 91.6 03/25/2020   PLT 133 (L) 03/25/2020   Recent Labs    03/11/20 1001 03/18/20 0956 03/25/20 1029  NA 131* 132* 136  K 3.2* 3.6 2.8*  CL 94* 96* 98  CO2 27 27 28   GLUCOSE 91 100* 108*  BUN 11 15 22   CREATININE 0.88 0.86 0.96  CALCIUM 10.2 10.7* 10.6*  GFRNONAA >60 >60 >60  PROT 9.6* 8.8* 8.8*  ALBUMIN 2.5* 2.7* 3.1*  AST 14* 12* 21  ALT 16 14 34  ALKPHOS 42 51 61  BILITOT 0.8 0.7 1.0   Iron/TIBC/Ferritin/ %Sat No results found for: IRON, TIBC, FERRITIN, IRONPCTSAT    RADIOGRAPHIC STUDIES: I have personally reviewed the radiological images as listed and agreed with the findings in the report. CT CHEST W CONTRAST  Result Date: 01/30/2020 CLINICAL DATA:  64 year old male with right flank pain. Right lower lobe pulmonary nodule. EXAM: CT CHEST, ABDOMEN, AND PELVIS WITH CONTRAST TECHNIQUE: Multidetector CT imaging of the chest, abdomen and pelvis was performed following the standard protocol during bolus administration of intravenous contrast. CONTRAST:  12m OMNIPAQUE IOHEXOL 300 MG/ML  SOLN COMPARISON:  None. FINDINGS: CT CHEST FINDINGS Cardiovascular: There is no cardiomegaly or pericardial effusion. The thoracic aorta is unremarkable. The origins of the great vessels of the aortic arch appear patent as visualized. The central pulmonary arteries  are patent. Mediastinum/Nodes: There is no hilar or mediastinal adenopathy. The esophagus and the thyroid gland are grossly unremarkable. No mediastinal fluid collection. Lungs/Pleura: There are bibasilar linear atelectasis/scarring. Multiple pulmonary nodules predominantly involving the lower lungs including a 10 mm  right middle lobe nodule (98/3), a 6 mm right lower lobe subpleural nodule (93/3), a 4 mm right middle lobe nodule (80/3), a 4 mm lingular nodule (67/3), and a 3 mm left lower lobe subpleural nodule (114/3). No focal consolidation, pleural effusion, or pneumothorax. The central airways are patent. Musculoskeletal: Old healed right anterior sixth rib fracture. There is a small indeterminate lucency in the anterior aspect of the right sixth rib anterior to the old fracture (93/3) which is not evaluated on this CT. Clinical correlation and attention on follow-up recommended. If there is history of known primary cancer or other concern for metastatic disease, bone scan may provide better evaluation. Several additional faint lucency is noted in the sternum. There is compression fracture of T9 with approximately 40% loss of vertebral body height, age indeterminate. Correlation with clinical exam and point tenderness recommended. There is mild perivertebral edema at this level and therefore an acute or subacute fracture is a possibility. No retropulsed fragment. CT ABDOMEN PELVIS FINDINGS No intra-abdominal free air or free fluid. Hepatobiliary: The liver is unremarkable. No intrahepatic biliary ductal dilatation. The gallbladder is unremarkable. Pancreas: Unremarkable. No pancreatic ductal dilatation or surrounding inflammatory changes. Spleen: Normal in size without focal abnormality. Adrenals/Urinary Tract: The adrenal glands are unremarkable. There is no hydronephrosis on either side. There is symmetric enhancement and excretion of contrast by both kidneys. The visualized ureters and urinary bladder appear  unremarkable. Stomach/Bowel: There is apparent focal area of thickening at the gastroesophageal junction (51/2 and coronal 86/4). This may be related to underdistention, however underlying lesion is not excluded. Further evaluation with upper GI study or direct visualization with endoscopy is recommended. There is no bowel obstruction or active inflammation. The appendix is normal. Vascular/Lymphatic: Mild aortoiliac atherosclerotic disease. The IVC is unremarkable. No portal venous gas. There is no adenopathy. Reproductive: The prostate and seminal vesicles are grossly unremarkable. No pelvic mass. Other: None Musculoskeletal: Osteopenia with degenerative changes of the spine. There is a 3.4 x 2.5 cm lytic lesion in the left pubic bone as well as a smaller lytic lesion in the right pubic bone. Additional faint subcentimeter lucencies may be present within the spine. Age indeterminate, old-appearing compression fracture of the inferior endplate of L4. No acute osseous pathology. IMPRESSION: 1. Multiple bilateral pulmonary nodules measuring up to 10 mm in the right middle lobe. These nodules are indeterminate and may be inflammatory/infectious in etiology. However, metastatic disease is not excluded in light of osseous findings. Clinical correlation and multidisciplinary consult advised. 2. Multiple osseous lytic lesions with the largest involving the left pubic bone concerning for metastatic disease. Further evaluation with bone scan recommended. 3. Compression fracture of T9 with approximately 40% loss of vertebral body height, age indeterminate. Correlation with clinical exam and point tenderness recommended. 4. Apparent focal area of thickening at the gastroesophageal junction. Further evaluation with upper GI study or direct visualization with endoscopy is recommended. 5. No bowel obstruction. Normal appendix. 6. Aortic Atherosclerosis (ICD10-I70.0). Electronically Signed   By: Anner Crete M.D.   On:  01/30/2020 20:06   MR Cervical Spine W Wo Contrast  Result Date: 02/22/2020 CLINICAL DATA:  Multiple myeloma and back pain. Lytic disease on previous imaging. EXAM: MRI CERVICAL SPINE WITHOUT AND WITH CONTRAST TECHNIQUE: Multiplanar and multiecho pulse sequences of the cervical spine, to include the craniocervical junction and cervicothoracic junction, were obtained without and with intravenous contrast. CONTRAST:  54m GADAVIST GADOBUTROL 1 MMOL/ML IV SOLN COMPARISON:  CT 09/06/2016 FINDINGS: Alignment: Normal Vertebrae: Normal. No  evidence of myeloma involvement in the cervical region. No regional fracture or lytic lesion. Cord: No cord compression or primary cord lesion. Posterior Fossa, vertebral arteries, paraspinal tissues: Negative Disc levels: No significant disc pathology. Minimal non-compressive disc bulges at C5-6 and C6-7. Minimal facet osteoarthritis. No compressive narrowing of the canal or foramina. IMPRESSION: 1. No evidence of myeloma involvement of the cervical region. 2. Minimal non-compressive disc bulges at C5-6 and C6-7. Electronically Signed   By: Nelson Chimes M.D.   On: 02/22/2020 12:30   MR Lumbar Spine W Wo Contrast  Result Date: 02/22/2020 CLINICAL DATA:  Myeloma and back pain. EXAM: MRI LUMBAR SPINE WITHOUT AND WITH CONTRAST TECHNIQUE: Multiplanar and multiecho pulse sequences of the lumbar spine were obtained without and with intravenous contrast. CONTRAST:  93m GADAVIST GADOBUTROL 1 MMOL/ML IV SOLN COMPARISON:  CT 01/29/2020 FINDINGS: Segmentation:  5 lumbar type vertebral bodies. Alignment:  Normal Vertebrae: No evidence of focal marrow space lesion or definite lytic lesion to suggest myeloma involvement. The patient does have acute or subacute minor superior endplate fractures at TN46and L1. No imaging finding to suggest that these are anything other than benign osteoporotic fractures. Minor endplate deformity inferior L4 which was present on the previous CT could be old or  subacute. This does not appear progressive. Conus medullaris and cauda equina: Conus extends to the L1 level. Conus and cauda equina appear normal. Paraspinal and other soft tissues: Negative Disc levels: T12-L1: Mild disc bulge.  No compressive stenosis. L1-2: Negative disc space. L2-3: Bulging of the disc. Slight indentation of the thecal sac but no compressive stenosis. L3-4: Bulging of the disc. Annular fissure. No compressive stenosis. L4-5: Central disc protrusion. Mild facet and ligamentous prominence. Stenosis of both lateral recesses with some potential to affect either L5 nerve. L5-S1: Mild disc bulge without compressive stenosis. IMPRESSION: 1. No definite evidence of myeloma involvement of the lumbar spine. 2. Minor superior endplate fractures at TE70and L1 which appear to be acute or subacute. These are not visible on the previous CT and therefore are new since 01/29/2020. There is no finding to suggest that these are anything other than benign osteoporotic fractures. 3. Minor inferior endplate deformity at L4 which was present on the previous CT could be old or subacute. Mild residual edema at that location which could indicate ongoing healing or relate to the endplate deformity. 4. Lumbar degenerative disc disease most notable at L4-5 where there is a central disc protrusion. Mild facet and ligamentous prominence. Mild stenosis of the lateral recesses with some potential to affect either L5 nerve. Electronically Signed   By: MNelson ChimesM.D.   On: 02/22/2020 12:36   CT ABDOMEN PELVIS W CONTRAST  Result Date: 01/30/2020 CLINICAL DATA:  64year old male with right flank pain. Right lower lobe pulmonary nodule. EXAM: CT CHEST, ABDOMEN, AND PELVIS WITH CONTRAST TECHNIQUE: Multidetector CT imaging of the chest, abdomen and pelvis was performed following the standard protocol during bolus administration of intravenous contrast. CONTRAST:  1066mOMNIPAQUE IOHEXOL 300 MG/ML  SOLN COMPARISON:  None.  FINDINGS: CT CHEST FINDINGS Cardiovascular: There is no cardiomegaly or pericardial effusion. The thoracic aorta is unremarkable. The origins of the great vessels of the aortic arch appear patent as visualized. The central pulmonary arteries are patent. Mediastinum/Nodes: There is no hilar or mediastinal adenopathy. The esophagus and the thyroid gland are grossly unremarkable. No mediastinal fluid collection. Lungs/Pleura: There are bibasilar linear atelectasis/scarring. Multiple pulmonary nodules predominantly involving the lower lungs including a 10  mm right middle lobe nodule (98/3), a 6 mm right lower lobe subpleural nodule (93/3), a 4 mm right middle lobe nodule (80/3), a 4 mm lingular nodule (67/3), and a 3 mm left lower lobe subpleural nodule (114/3). No focal consolidation, pleural effusion, or pneumothorax. The central airways are patent. Musculoskeletal: Old healed right anterior sixth rib fracture. There is a small indeterminate lucency in the anterior aspect of the right sixth rib anterior to the old fracture (93/3) which is not evaluated on this CT. Clinical correlation and attention on follow-up recommended. If there is history of known primary cancer or other concern for metastatic disease, bone scan may provide better evaluation. Several additional faint lucency is noted in the sternum. There is compression fracture of T9 with approximately 40% loss of vertebral body height, age indeterminate. Correlation with clinical exam and point tenderness recommended. There is mild perivertebral edema at this level and therefore an acute or subacute fracture is a possibility. No retropulsed fragment. CT ABDOMEN PELVIS FINDINGS No intra-abdominal free air or free fluid. Hepatobiliary: The liver is unremarkable. No intrahepatic biliary ductal dilatation. The gallbladder is unremarkable. Pancreas: Unremarkable. No pancreatic ductal dilatation or surrounding inflammatory changes. Spleen: Normal in size without  focal abnormality. Adrenals/Urinary Tract: The adrenal glands are unremarkable. There is no hydronephrosis on either side. There is symmetric enhancement and excretion of contrast by both kidneys. The visualized ureters and urinary bladder appear unremarkable. Stomach/Bowel: There is apparent focal area of thickening at the gastroesophageal junction (51/2 and coronal 86/4). This may be related to underdistention, however underlying lesion is not excluded. Further evaluation with upper GI study or direct visualization with endoscopy is recommended. There is no bowel obstruction or active inflammation. The appendix is normal. Vascular/Lymphatic: Mild aortoiliac atherosclerotic disease. The IVC is unremarkable. No portal venous gas. There is no adenopathy. Reproductive: The prostate and seminal vesicles are grossly unremarkable. No pelvic mass. Other: None Musculoskeletal: Osteopenia with degenerative changes of the spine. There is a 3.4 x 2.5 cm lytic lesion in the left pubic bone as well as a smaller lytic lesion in the right pubic bone. Additional faint subcentimeter lucencies may be present within the spine. Age indeterminate, old-appearing compression fracture of the inferior endplate of L4. No acute osseous pathology. IMPRESSION: 1. Multiple bilateral pulmonary nodules measuring up to 10 mm in the right middle lobe. These nodules are indeterminate and may be inflammatory/infectious in etiology. However, metastatic disease is not excluded in light of osseous findings. Clinical correlation and multidisciplinary consult advised. 2. Multiple osseous lytic lesions with the largest involving the left pubic bone concerning for metastatic disease. Further evaluation with bone scan recommended. 3. Compression fracture of T9 with approximately 40% loss of vertebral body height, age indeterminate. Correlation with clinical exam and point tenderness recommended. 4. Apparent focal area of thickening at the gastroesophageal  junction. Further evaluation with upper GI study or direct visualization with endoscopy is recommended. 5. No bowel obstruction. Normal appendix. 6. Aortic Atherosclerosis (ICD10-I70.0). Electronically Signed   By: Anner Crete M.D.   On: 01/30/2020 20:06   CT BONE MARROW BIOPSY & ASPIRATION  Result Date: 02/12/2020 INDICATION: 64 year old with abnormal serum protein electrophoresis and lytic bone lesions. Concern for plasma cell disorder. Request for bone marrow biopsy. EXAM: CT GUIDED BONE MARROW ASPIRATES AND BIOPSY Physician: Stephan Minister. Anselm Pancoast, MD MEDICATIONS: None. ANESTHESIA/SEDATION: Fentanyl 100 mcg IV; Versed 2.0 mg IV Moderate Sedation Time:  13 minutes The patient was continuously monitored during the procedure by the interventional radiology nurse  under my direct supervision. COMPLICATIONS: None immediate. PROCEDURE: The procedure was explained to the patient. The risks and benefits of the procedure were discussed and the patient's questions were addressed. Informed consent was obtained from the patient. The patient was placed prone on CT table. Images of the pelvis were obtained. The right side of back was prepped and draped in sterile fashion. The skin and right posterior ilium were anesthetized with 1% lidocaine. 11 gauge bone needle was directed into the right ilium with CT guidance. Two aspirates and one core biopsy were obtained. Bandage placed over the puncture site. IMPRESSION: CT guided bone marrow aspiration and core biopsy. Electronically Signed   By: Markus Daft M.D.   On: 02/12/2020 10:45      ASSESSMENT & PLAN:  1. Multiple myeloma not having achieved remission ([place])   2. Compression fracture of T9 vertebra, initial encounter ([place])   3. Hypercalcemia   4. Neoplasm related pain   5. Normocytic anemia   6. Encounter for antineoplastic chemotherapy    #IgG multiple myeloma, stage II 02/27/2020, started on Revlimid 25 mg 2 weeks on 1 week off. Started cycle 2 Revlimid on  03/20/2020. Labs reviewed and discussed with patient. Counts acceptable to proceed with Velcade with dexamethasone today. Continue aspirin 81 mg and acyclovir 400 mg twice daily for shingle prophylaxis Patient will proceed with Velcade treatment on 04/01/2020  #Back pain due to compression fracture.  Avoid heavy lifting.  Pain has significantly improved. #Drug-induced constipation.  Continue current bowel regimen. #Focal area of thickening at the GE junction.  Discussed with patient.  Establish care with gastroenterology   #Lung nodules, measures up to 10 mm on the right middle lobe.  Indeterminate.  Attention on follow-up.  Plan to repeat CT chest in January/February 2022. #Hypercalcemia, likely secondary to multiple myeloma.  Zometa on 02/29/2020 and 03/18/2020.  Recommend patient not to take any calcium-containing vitamin.  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  Hemoglobin stable. #Severe hypokalemia, patient will receive IV potassium chloride 40 mEq today.  Recommend patient to start oral potassium chloride 20 mEq daily for 1 week. Supportive care measures are necessary for patient well-being and will be provided as necessary. We spent sufficient time to discuss many aspect of care, questions were answered to patient's satisfaction.  All questions were answered. The patient knows to call the clinic with any problems questions or concerns.  cc Sofie Hartigan, MD    Return of visit: Follow-up on 04/08/2020  Earlie Server, MD, PhD Hematology Oncology [facility] Pager- [phone] 03/25/2020

## 2020-03-26 LAB — KAPPA/LAMBDA LIGHT CHAINS
Kappa free light chain: 192.1 mg/L — ABNORMAL HIGH (ref 3.3–19.4)
Kappa, lambda light chain ratio: 58.21 — ABNORMAL HIGH (ref 0.26–1.65)
Lambda free light chains: 3.3 mg/L — ABNORMAL LOW (ref 5.7–26.3)

## 2020-03-26 LAB — IGG: IgG (Immunoglobin G), Serum: 3427 mg/dL — ABNORMAL HIGH (ref 603–1613)

## 2020-03-27 LAB — PROTEIN ELECTROPHORESIS, SERUM
A/G Ratio: 0.7 (ref 0.7–1.7)
Albumin ELP: 3.3 g/dL (ref 2.9–4.4)
Alpha-1-Globulin: 0.2 g/dL (ref 0.0–0.4)
Alpha-2-Globulin: 0.8 g/dL (ref 0.4–1.0)
Beta Globulin: 3.7 g/dL — ABNORMAL HIGH (ref 0.7–1.3)
Gamma Globulin: 0.2 g/dL — ABNORMAL LOW (ref 0.4–1.8)
Globulin, Total: 4.9 g/dL — ABNORMAL HIGH (ref 2.2–3.9)
M-Spike, %: 3 g/dL — ABNORMAL HIGH
Total Protein ELP: 8.2 g/dL (ref 6.0–8.5)

## 2020-04-01 ENCOUNTER — Other Ambulatory Visit: Payer: Self-pay | Admitting: Oncology

## 2020-04-01 ENCOUNTER — Inpatient Hospital Stay: Payer: Medicare Other

## 2020-04-01 ENCOUNTER — Other Ambulatory Visit: Payer: Self-pay

## 2020-04-01 ENCOUNTER — Telehealth: Payer: Self-pay | Admitting: Oncology

## 2020-04-01 VITALS — BP 117/86 | HR 85 | Temp 97.3°F | Resp 18 | Wt 239.2 lb

## 2020-04-01 DIAGNOSIS — C9000 Multiple myeloma not having achieved remission: Secondary | ICD-10-CM

## 2020-04-01 LAB — COMPREHENSIVE METABOLIC PANEL
ALT: 27 U/L (ref 0–44)
AST: 16 U/L (ref 15–41)
Albumin: 2.9 g/dL — ABNORMAL LOW (ref 3.5–5.0)
Alkaline Phosphatase: 57 U/L (ref 38–126)
Anion gap: 9 (ref 5–15)
BUN: 14 mg/dL (ref 8–23)
CO2: 25 mmol/L (ref 22–32)
Calcium: 9.7 mg/dL (ref 8.9–10.3)
Chloride: 99 mmol/L (ref 98–111)
Creatinine, Ser: 0.94 mg/dL (ref 0.61–1.24)
GFR, Estimated: >60 mL/min (ref >60)
Glucose, Bld: 107 mg/dL — ABNORMAL HIGH (ref 70–99)
Potassium: 3.4 mmol/L — ABNORMAL LOW (ref 3.5–5.1)
Sodium: 133 mmol/L — ABNORMAL LOW (ref 135–145)
Total Bilirubin: 0.8 mg/dL (ref 0.3–1.2)
Total Protein: 8 g/dL (ref 6.5–8.1)

## 2020-04-01 LAB — CBC WITH DIFFERENTIAL/PLATELET
Abs Immature Granulocytes: 0.01 10*3/uL (ref 0.00–0.07)
Basophils Absolute: 0 10*3/uL (ref 0.0–0.1)
Basophils Relative: 1 %
Eosinophils Absolute: 0.8 10*3/uL — ABNORMAL HIGH (ref 0.0–0.5)
Eosinophils Relative: 19 %
HCT: 35.1 % — ABNORMAL LOW (ref 39.0–52.0)
Hemoglobin: 11.6 g/dL — ABNORMAL LOW (ref 13.0–17.0)
Immature Granulocytes: 0 %
Lymphocytes Relative: 17 %
Lymphs Abs: 0.7 10*3/uL (ref 0.7–4.0)
MCH: 30.4 pg (ref 26.0–34.0)
MCHC: 33 g/dL (ref 30.0–36.0)
MCV: 91.9 fL (ref 80.0–100.0)
Monocytes Absolute: 0.7 10*3/uL (ref 0.1–1.0)
Monocytes Relative: 17 %
Neutro Abs: 2 10*3/uL (ref 1.7–7.7)
Neutrophils Relative %: 46 %
Platelets: 138 10*3/uL — ABNORMAL LOW (ref 150–400)
RBC: 3.82 MIL/uL — ABNORMAL LOW (ref 4.22–5.81)
RDW: 16.1 % — ABNORMAL HIGH (ref 11.5–15.5)
Smear Review: NORMAL
WBC: 4.3 10*3/uL (ref 4.0–10.5)
nRBC: 0 % (ref 0.0–0.2)

## 2020-04-01 MED ORDER — BORTEZOMIB CHEMO SQ INJECTION 3.5 MG (2.5MG/ML)
1.3000 mg/m2 | Freq: Once | INTRAMUSCULAR | Status: AC
  Administered 2020-04-01: 10:00:00 3.25 mg via SUBCUTANEOUS
  Filled 2020-04-01: qty 1.3

## 2020-04-01 MED ORDER — DEXAMETHASONE 4 MG PO TABS
20.0000 mg | ORAL_TABLET | Freq: Once | ORAL | Status: AC
Start: 2020-04-01 — End: 2020-04-01
  Administered 2020-04-01: 10:00:00 20 mg via ORAL
  Filled 2020-04-01: qty 5

## 2020-04-01 MED ORDER — POTASSIUM CHLORIDE CRYS ER 20 MEQ PO TBCR: 20.0000 meq | EXTENDED_RELEASE_TABLET | Freq: Every day | ORAL | 0 refills | Status: DC

## 2020-04-01 NOTE — Progress Notes (Signed)
Patient tolerated his Velcade injection. No potassium infusion needed today since patient K+ is 3.4. Per Sonia Baller, NP she will put in another refill of his oral potassium.  Tommy Smith is aware to continue taking his oral potassium at home. Vital signs stable.

## 2020-04-01 NOTE — Telephone Encounter (Signed)
Re: Potassium  K today is 3.4.   No IV potassium today.   He is tolerating oral K well.   Will send new RX for an additional week.   Keep scheduled appt.   Faythe Casa, NP 04/01/2020 10:40 AM

## 2020-04-08 ENCOUNTER — Encounter: Payer: Self-pay | Admitting: Oncology

## 2020-04-08 ENCOUNTER — Other Ambulatory Visit: Payer: Self-pay

## 2020-04-08 ENCOUNTER — Inpatient Hospital Stay: Payer: Medicare Other

## 2020-04-08 ENCOUNTER — Inpatient Hospital Stay (HOSPITAL_BASED_OUTPATIENT_CLINIC_OR_DEPARTMENT_OTHER): Payer: Medicare Other | Admitting: Oncology

## 2020-04-08 VITALS — BP 104/70 | HR 72 | Temp 98.0°F | Resp 18 | Wt 243.2 lb

## 2020-04-08 DIAGNOSIS — Z5111 Encounter for antineoplastic chemotherapy: Secondary | ICD-10-CM | POA: Diagnosis not present

## 2020-04-08 DIAGNOSIS — R911 Solitary pulmonary nodule: Secondary | ICD-10-CM

## 2020-04-08 DIAGNOSIS — C9000 Multiple myeloma not having achieved remission: Secondary | ICD-10-CM

## 2020-04-08 DIAGNOSIS — E876 Hypokalemia: Secondary | ICD-10-CM | POA: Diagnosis not present

## 2020-04-08 DIAGNOSIS — M899 Disorder of bone, unspecified: Secondary | ICD-10-CM

## 2020-04-08 LAB — CBC WITH DIFFERENTIAL/PLATELET
Abs Immature Granulocytes: 0.01 10*3/uL (ref 0.00–0.07)
Basophils Absolute: 0 10*3/uL (ref 0.0–0.1)
Basophils Relative: 0 %
Eosinophils Absolute: 0.7 10*3/uL — ABNORMAL HIGH (ref 0.0–0.5)
Eosinophils Relative: 18 %
HCT: 34 % — ABNORMAL LOW (ref 39.0–52.0)
Hemoglobin: 11.2 g/dL — ABNORMAL LOW (ref 13.0–17.0)
Immature Granulocytes: 0 %
Lymphocytes Relative: 26 %
Lymphs Abs: 1.1 10*3/uL (ref 0.7–4.0)
MCH: 29.9 pg (ref 26.0–34.0)
MCHC: 32.9 g/dL (ref 30.0–36.0)
MCV: 90.9 fL (ref 80.0–100.0)
Monocytes Absolute: 0.5 10*3/uL (ref 0.1–1.0)
Monocytes Relative: 13 %
Neutro Abs: 1.7 10*3/uL (ref 1.7–7.7)
Neutrophils Relative %: 43 %
Platelets: 143 10*3/uL — ABNORMAL LOW (ref 150–400)
RBC: 3.74 MIL/uL — ABNORMAL LOW (ref 4.22–5.81)
RDW: 16.1 % — ABNORMAL HIGH (ref 11.5–15.5)
WBC: 4.1 10*3/uL (ref 4.0–10.5)
nRBC: 0 % (ref 0.0–0.2)

## 2020-04-08 LAB — COMPREHENSIVE METABOLIC PANEL
ALT: 25 U/L (ref 0–44)
AST: 15 U/L (ref 15–41)
Albumin: 2.9 g/dL — ABNORMAL LOW (ref 3.5–5.0)
Alkaline Phosphatase: 59 U/L (ref 38–126)
Anion gap: 10 (ref 5–15)
BUN: 11 mg/dL (ref 8–23)
CO2: 25 mmol/L (ref 22–32)
Calcium: 9.4 mg/dL (ref 8.9–10.3)
Chloride: 101 mmol/L (ref 98–111)
Creatinine, Ser: 0.83 mg/dL (ref 0.61–1.24)
GFR, Estimated: >60 mL/min (ref >60)
Glucose, Bld: 88 mg/dL (ref 70–99)
Potassium: 3.2 mmol/L — ABNORMAL LOW (ref 3.5–5.1)
Sodium: 136 mmol/L (ref 135–145)
Total Bilirubin: 1 mg/dL (ref 0.3–1.2)
Total Protein: 7.4 g/dL (ref 6.5–8.1)

## 2020-04-08 MED ORDER — DEXAMETHASONE 4 MG PO TABS
20.0000 mg | ORAL_TABLET | Freq: Once | ORAL | Status: AC
  Administered 2020-04-08: 14:00:00 20 mg via ORAL
  Filled 2020-04-08: qty 5

## 2020-04-08 MED ORDER — BORTEZOMIB CHEMO SQ INJECTION 3.5 MG (2.5MG/ML)
1.3000 mg/m2 | Freq: Once | INTRAMUSCULAR | Status: AC
  Administered 2020-04-08: 14:00:00 3.25 mg via SUBCUTANEOUS
  Filled 2020-04-08: qty 1.3

## 2020-04-08 MED ORDER — LENALIDOMIDE 25 MG PO CAPS: 25.0000 mg | ORAL_CAPSULE | Freq: Every day | ORAL | 0 refills | Status: DC

## 2020-04-08 MED ORDER — POTASSIUM CHLORIDE ER 10 MEQ PO TBCR: 10.0000 meq | EXTENDED_RELEASE_TABLET | Freq: Every day | ORAL | 0 refills | Status: DC

## 2020-04-08 NOTE — Progress Notes (Signed)
Tolerated Velcade injection well. Discharged home in stable condition.

## 2020-04-08 NOTE — Progress Notes (Signed)
Hematology/Oncology follow up note [facility] [phone] [phone]   Patient Care Team: Sofie Hartigan, MD as PCP - General (Family Medicine) Noreene Filbert, MD as Radiation Oncologist (Radiation Oncology)  REFERRING PROVIDER: Sofie Hartigan, MD  CHIEF COMPLAINTS/REASON FOR VISIT:  Follow-up for multiple myeloma HISTORY OF PRESENTING ILLNESS:   Tommy Petzold. is a  64 y.o.  male with PMH listed below was seen in consultation at the request of  Sofie Hartigan, MD  for evaluation of abnormal serum protein electrophoresis.  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  Increase calcium level at 12.3, PTH was normal.  Creatinine 1, estimated GFR 91, CBC showed hemoglobin 12.4, Patient also recently had a CT chest abdomen pelvis with contrast for evaluation of right flank pain and right lower lobe nodule. 01/29/2020, CT showed multiple bilateral pulmonary nodules measuring up to 10 mm in the right middle lobe, nodules are indeterminate, infectious/inflammatory etiology versus metastatic disease. Multiple osseous lytic lesions, largest lesion involving the left pubic bone concerning for metastatic disease. Compression fractures at T9, age indeterminate. Appearance focal area of thickening at the gastroesophageal junction.  Further evaluation with upper GI study or direct visualization with endoscopy is recommended No bowel obstruction. Aortic atherosclerosis.  Patient reports NSAIDs as needed for lower back pain.  Mid lower back pain usually is worse when when he stands up from sitting position.  Patient works in the [employer].  Lives at home with wife.  Denies any unintentional weight loss, fever, chills, night sweating  # IgG multiple myeloma, stage II Baseline M protein 4.1, kappa light chain level 333.8 Beta-2 microglobulin 2.1, albumin 2.7. 02/12/2020, bone marrow biopsy showed  hypercellular for age, 80% plasma cell which are complex restricted by light chain in situ heparinization.  Background hematopoiesis is present but reduced. Cytogenetics is normal, Myeloma FISH panel-deletion 1P,Standard risk.  # 02/27/2020 patient has been started on Revlimid 25 mg D1-14 #02/24/2020, Velcade weekly with dexamethasone 20 mg weekly INTERVAL HISTORY Tommy Mchargue. is a 64 y.o. male who has above history reviewed by me today presents for follow up visit for  New diagnosis multiple myeloma Problems and complaints are listed below: Patient reports doing well.  Back pain has improved.  He takes Tylenol as needed.  No longer on narcotics. No new complaints Occasional right hand numbness.   Review of Systems  Constitutional: Negative for appetite change, chills, fatigue, fever and unexpected weight change.  HENT:   Negative for hearing loss and voice change.   Eyes: Negative for eye problems and icterus.  Respiratory: Negative for chest tightness, cough and shortness of breath.   Cardiovascular: Negative for chest pain and leg swelling.  Gastrointestinal: Negative for abdominal distention and abdominal pain.  Endocrine: Negative for hot flashes.  Genitourinary: Negative for difficulty urinating, dysuria and frequency.   Musculoskeletal: Negative for arthralgias, back pain and neck pain.  Skin: Negative for itching and rash.  Neurological: Negative for light-headedness and numbness.  Hematological: Negative for adenopathy. Does not bruise/bleed easily.  Psychiatric/Behavioral: Negative for confusion.     MEDICAL HISTORY:  Past Medical History:  Diagnosis Date  . Depression   . Hypercalcemia 03/02/2020  . Hyperlipemia   . Hypertension   . Hypogonadism in male     SURGICAL HISTORY: Past Surgical History:  Procedure Laterality Date  . COLONOSCOPY  07/14/2008    SOCIAL HISTORY: Social History   Socioeconomic History  . Marital status: Married    Spouse  name: Not on file  . Number of children: Not on file  . Years of education: Not on file  . Highest education level: Not on file  Occupational History  . Not on file  Tobacco Use  . Smoking status: Current Every Day Smoker    Packs/day: 1.00    Years: 30.00    Pack years: 30.00    Types: Cigarettes  . Smokeless tobacco: Never Used  Substance and Sexual Activity  . Alcohol use: Yes    Comment: occcassional  . Drug use: No  . Sexual activity: Not on file  Other Topics Concern  . Not on file  Social History Narrative  . Not on file   Social Determinants of Health   Financial Resource Strain: Not on file  Food Insecurity: Not on file  Transportation Needs: Not on file  Physical Activity: Not on file  Stress: Not on file  Social Connections: Not on file  Intimate Partner Violence: Not on file    FAMILY HISTORY: Family History  Problem Relation Age of Onset  . Skin cancer Mother   . Prostate cancer Father     ALLERGIES:  has No Known Allergies.  MEDICATIONS:  Current Outpatient Medications  Medication Sig Dispense Refill  . acyclovir (ZOVIRAX) 400 MG tablet Take 1 tablet (400 mg total) by mouth 2 (two) times daily. 60 tablet 3  . amLODipine (NORVASC) 10 MG tablet Take by mouth.    . ASPIRIN 81 PO Take 81 mg by mouth daily.    Marland Kitchen atorvastatin (LIPITOR) 40 MG tablet Take 40 mg by mouth daily.    Marland Kitchen buPROPion (WELLBUTRIN XL) 150 MG 24 hr tablet Take by mouth.    . dutasteride (AVODART) 0.5 MG capsule Take by mouth.    . losartan-hydrochlorothiazide (HYZAAR) 100-12.5 MG tablet Take 1 tablet by mouth daily.    Marland Kitchen omeprazole (PRILOSEC) 20 MG capsule Take 1 capsule (20 mg total) by mouth daily. 30 capsule 1  . ondansetron (ZOFRAN) 8 MG tablet Take 1 tablet (8 mg total) by mouth 2 (two) times daily as needed (Nausea or vomiting). 30 tablet 1  . polyethylene glycol (MIRALAX) 17 g packet Take 17 g by mouth daily. 14 each 0  . potassium chloride (KLOR-CON) 10 MEQ tablet Take 1  tablet (10 mEq total) by mouth daily. 30 tablet 0  . potassium chloride SA (KLOR-CON) 20 MEQ tablet Take 1 tablet (20 mEq total) by mouth daily. 10 tablet 0  . Psyllium (METAMUCIL MULTIHEALTH FIBER PO) Take by mouth daily.    Marland Kitchen lenalidomide (REVLIMID) 25 MG capsule Take 1 capsule (25 mg total) by mouth daily. Take 14 days on, 7 days off, repeat every 21 days. 14 capsule 0  . senna-docusate (SENOKOT-S) 8.6-50 MG tablet Take 2 tablets by mouth daily. (Patient not taking: No sig reported) 60 tablet 0   No current facility-administered medications for this visit.     PHYSICAL EXAMINATION: ECOG PERFORMANCE STATUS: 1 - Symptomatic but completely ambulatory Vitals:   04/08/20 1320  BP: 104/70  Pulse: 72  Resp: 18  Temp: 98 F (36.7 C)   Filed Weights   04/08/20 1320  Weight: 243 lb 3.2 oz (110.3 kg)    Physical Exam Constitutional:      General: He is not in acute distress.    Appearance: He is not diaphoretic.  HENT:     Head: Normocephalic and atraumatic.     Nose: Nose normal.     Mouth/Throat:  Pharynx: No oropharyngeal exudate.  Eyes:     General: No scleral icterus.    Pupils: Pupils are equal, round, and reactive to light.  Cardiovascular:     Rate and Rhythm: Normal rate and regular rhythm.     Heart sounds: No murmur heard.   Pulmonary:     Effort: Pulmonary effort is normal. No respiratory distress.     Breath sounds: No rales.  Chest:     Chest wall: No tenderness.  Abdominal:     General: There is no distension.     Palpations: Abdomen is soft.     Tenderness: There is no abdominal tenderness.  Musculoskeletal:        General: Normal range of motion.     Cervical back: Normal range of motion and neck supple.  Skin:    General: Skin is warm and dry.     Findings: No erythema.  Neurological:     Mental Status: He is alert and oriented to person, place, and time.     Cranial Nerves: No cranial nerve deficit.     Motor: No abnormal muscle tone.      Coordination: Coordination normal.  Psychiatric:        Mood and Affect: Affect normal.       LABORATORY DATA:  I have reviewed the data as listed Lab Results  Component Value Date   WBC 4.1 04/08/2020   HGB 11.2 (L) 04/08/2020   HCT 34.0 (L) 04/08/2020   MCV 90.9 04/08/2020   PLT 143 (L) 04/08/2020   Recent Labs    03/25/20 1029 04/01/20 0848 04/08/20 1259  NA 136 133* 136  K 2.8* 3.4* 3.2*  CL 98 99 101  CO2 28 25 25   GLUCOSE 108* 107* 88  BUN 22 14 11   CREATININE 0.96 0.94 0.83  CALCIUM 10.6* 9.7 9.4  GFRNONAA >60 >60 >60  PROT 8.8* 8.0 7.4  ALBUMIN 3.1* 2.9* 2.9*  AST 21 16 15   ALT 34 27 25  ALKPHOS 61 57 59  BILITOT 1.0 0.8 1.0   Iron/TIBC/Ferritin/ %Sat No results found for: IRON, TIBC, FERRITIN, IRONPCTSAT    RADIOGRAPHIC STUDIES: I have personally reviewed the radiological images as listed and agreed with the findings in the report. CT CHEST W CONTRAST  Result Date: 01/30/2020 CLINICAL DATA:  64 year old male with right flank pain. Right lower lobe pulmonary nodule. EXAM: CT CHEST, ABDOMEN, AND PELVIS WITH CONTRAST TECHNIQUE: Multidetector CT imaging of the chest, abdomen and pelvis was performed following the standard protocol during bolus administration of intravenous contrast. CONTRAST:  123m OMNIPAQUE IOHEXOL 300 MG/ML  SOLN COMPARISON:  None. FINDINGS: CT CHEST FINDINGS Cardiovascular: There is no cardiomegaly or pericardial effusion. The thoracic aorta is unremarkable. The origins of the great vessels of the aortic arch appear patent as visualized. The central pulmonary arteries are patent. Mediastinum/Nodes: There is no hilar or mediastinal adenopathy. The esophagus and the thyroid gland are grossly unremarkable. No mediastinal fluid collection. Lungs/Pleura: There are bibasilar linear atelectasis/scarring. Multiple pulmonary nodules predominantly involving the lower lungs including a 10 mm right middle lobe nodule (98/3), a 6 mm right lower lobe  subpleural nodule (93/3), a 4 mm right middle lobe nodule (80/3), a 4 mm lingular nodule (67/3), and a 3 mm left lower lobe subpleural nodule (114/3). No focal consolidation, pleural effusion, or pneumothorax. The central airways are patent. Musculoskeletal: Old healed right anterior sixth rib fracture. There is a small indeterminate lucency in the anterior aspect of the right sixth  rib anterior to the old fracture (93/3) which is not evaluated on this CT. Clinical correlation and attention on follow-up recommended. If there is history of known primary cancer or other concern for metastatic disease, bone scan may provide better evaluation. Several additional faint lucency is noted in the sternum. There is compression fracture of T9 with approximately 40% loss of vertebral body height, age indeterminate. Correlation with clinical exam and point tenderness recommended. There is mild perivertebral edema at this level and therefore an acute or subacute fracture is a possibility. No retropulsed fragment. CT ABDOMEN PELVIS FINDINGS No intra-abdominal free air or free fluid. Hepatobiliary: The liver is unremarkable. No intrahepatic biliary ductal dilatation. The gallbladder is unremarkable. Pancreas: Unremarkable. No pancreatic ductal dilatation or surrounding inflammatory changes. Spleen: Normal in size without focal abnormality. Adrenals/Urinary Tract: The adrenal glands are unremarkable. There is no hydronephrosis on either side. There is symmetric enhancement and excretion of contrast by both kidneys. The visualized ureters and urinary bladder appear unremarkable. Stomach/Bowel: There is apparent focal area of thickening at the gastroesophageal junction (51/2 and coronal 86/4). This may be related to underdistention, however underlying lesion is not excluded. Further evaluation with upper GI study or direct visualization with endoscopy is recommended. There is no bowel obstruction or active inflammation. The appendix  is normal. Vascular/Lymphatic: Mild aortoiliac atherosclerotic disease. The IVC is unremarkable. No portal venous gas. There is no adenopathy. Reproductive: The prostate and seminal vesicles are grossly unremarkable. No pelvic mass. Other: None Musculoskeletal: Osteopenia with degenerative changes of the spine. There is a 3.4 x 2.5 cm lytic lesion in the left pubic bone as well as a smaller lytic lesion in the right pubic bone. Additional faint subcentimeter lucencies may be present within the spine. Age indeterminate, old-appearing compression fracture of the inferior endplate of L4. No acute osseous pathology. IMPRESSION: 1. Multiple bilateral pulmonary nodules measuring up to 10 mm in the right middle lobe. These nodules are indeterminate and may be inflammatory/infectious in etiology. However, metastatic disease is not excluded in light of osseous findings. Clinical correlation and multidisciplinary consult advised. 2. Multiple osseous lytic lesions with the largest involving the left pubic bone concerning for metastatic disease. Further evaluation with bone scan recommended. 3. Compression fracture of T9 with approximately 40% loss of vertebral body height, age indeterminate. Correlation with clinical exam and point tenderness recommended. 4. Apparent focal area of thickening at the gastroesophageal junction. Further evaluation with upper GI study or direct visualization with endoscopy is recommended. 5. No bowel obstruction. Normal appendix. 6. Aortic Atherosclerosis (ICD10-I70.0). Electronically Signed   By: Anner Crete M.D.   On: 01/30/2020 20:06   MR Cervical Spine W Wo Contrast  Result Date: 02/22/2020 CLINICAL DATA:  Multiple myeloma and back pain. Lytic disease on previous imaging. EXAM: MRI CERVICAL SPINE WITHOUT AND WITH CONTRAST TECHNIQUE: Multiplanar and multiecho pulse sequences of the cervical spine, to include the craniocervical junction and cervicothoracic junction, were obtained  without and with intravenous contrast. CONTRAST:  69m GADAVIST GADOBUTROL 1 MMOL/ML IV SOLN COMPARISON:  CT 09/06/2016 FINDINGS: Alignment: Normal Vertebrae: Normal. No evidence of myeloma involvement in the cervical region. No regional fracture or lytic lesion. Cord: No cord compression or primary cord lesion. Posterior Fossa, vertebral arteries, paraspinal tissues: Negative Disc levels: No significant disc pathology. Minimal non-compressive disc bulges at C5-6 and C6-7. Minimal facet osteoarthritis. No compressive narrowing of the canal or foramina. IMPRESSION: 1. No evidence of myeloma involvement of the cervical region. 2. Minimal non-compressive disc bulges at C5-6  and C6-7. Electronically Signed   By: Nelson Chimes M.D.   On: 02/22/2020 12:30   MR Lumbar Spine W Wo Contrast  Result Date: 02/22/2020 CLINICAL DATA:  Myeloma and back pain. EXAM: MRI LUMBAR SPINE WITHOUT AND WITH CONTRAST TECHNIQUE: Multiplanar and multiecho pulse sequences of the lumbar spine were obtained without and with intravenous contrast. CONTRAST:  17m GADAVIST GADOBUTROL 1 MMOL/ML IV SOLN COMPARISON:  CT 01/29/2020 FINDINGS: Segmentation:  5 lumbar type vertebral bodies. Alignment:  Normal Vertebrae: No evidence of focal marrow space lesion or definite lytic lesion to suggest myeloma involvement. The patient does have acute or subacute minor superior endplate fractures at TH96and L1. No imaging finding to suggest that these are anything other than benign osteoporotic fractures. Minor endplate deformity inferior L4 which was present on the previous CT could be old or subacute. This does not appear progressive. Conus medullaris and cauda equina: Conus extends to the L1 level. Conus and cauda equina appear normal. Paraspinal and other soft tissues: Negative Disc levels: T12-L1: Mild disc bulge.  No compressive stenosis. L1-2: Negative disc space. L2-3: Bulging of the disc. Slight indentation of the thecal sac but no compressive  stenosis. L3-4: Bulging of the disc. Annular fissure. No compressive stenosis. L4-5: Central disc protrusion. Mild facet and ligamentous prominence. Stenosis of both lateral recesses with some potential to affect either L5 nerve. L5-S1: Mild disc bulge without compressive stenosis. IMPRESSION: 1. No definite evidence of myeloma involvement of the lumbar spine. 2. Minor superior endplate fractures at TQ22and L1 which appear to be acute or subacute. These are not visible on the previous CT and therefore are new since 01/29/2020. There is no finding to suggest that these are anything other than benign osteoporotic fractures. 3. Minor inferior endplate deformity at L4 which was present on the previous CT could be old or subacute. Mild residual edema at that location which could indicate ongoing healing or relate to the endplate deformity. 4. Lumbar degenerative disc disease most notable at L4-5 where there is a central disc protrusion. Mild facet and ligamentous prominence. Mild stenosis of the lateral recesses with some potential to affect either L5 nerve. Electronically Signed   By: MNelson ChimesM.D.   On: 02/22/2020 12:36   CT ABDOMEN PELVIS W CONTRAST  Result Date: 01/30/2020 CLINICAL DATA:  64year old male with right flank pain. Right lower lobe pulmonary nodule. EXAM: CT CHEST, ABDOMEN, AND PELVIS WITH CONTRAST TECHNIQUE: Multidetector CT imaging of the chest, abdomen and pelvis was performed following the standard protocol during bolus administration of intravenous contrast. CONTRAST:  1076mOMNIPAQUE IOHEXOL 300 MG/ML  SOLN COMPARISON:  None. FINDINGS: CT CHEST FINDINGS Cardiovascular: There is no cardiomegaly or pericardial effusion. The thoracic aorta is unremarkable. The origins of the great vessels of the aortic arch appear patent as visualized. The central pulmonary arteries are patent. Mediastinum/Nodes: There is no hilar or mediastinal adenopathy. The esophagus and the thyroid gland are grossly  unremarkable. No mediastinal fluid collection. Lungs/Pleura: There are bibasilar linear atelectasis/scarring. Multiple pulmonary nodules predominantly involving the lower lungs including a 10 mm right middle lobe nodule (98/3), a 6 mm right lower lobe subpleural nodule (93/3), a 4 mm right middle lobe nodule (80/3), a 4 mm lingular nodule (67/3), and a 3 mm left lower lobe subpleural nodule (114/3). No focal consolidation, pleural effusion, or pneumothorax. The central airways are patent. Musculoskeletal: Old healed right anterior sixth rib fracture. There is a small indeterminate lucency in the anterior aspect of the right  sixth rib anterior to the old fracture (93/3) which is not evaluated on this CT. Clinical correlation and attention on follow-up recommended. If there is history of known primary cancer or other concern for metastatic disease, bone scan may provide better evaluation. Several additional faint lucency is noted in the sternum. There is compression fracture of T9 with approximately 40% loss of vertebral body height, age indeterminate. Correlation with clinical exam and point tenderness recommended. There is mild perivertebral edema at this level and therefore an acute or subacute fracture is a possibility. No retropulsed fragment. CT ABDOMEN PELVIS FINDINGS No intra-abdominal free air or free fluid. Hepatobiliary: The liver is unremarkable. No intrahepatic biliary ductal dilatation. The gallbladder is unremarkable. Pancreas: Unremarkable. No pancreatic ductal dilatation or surrounding inflammatory changes. Spleen: Normal in size without focal abnormality. Adrenals/Urinary Tract: The adrenal glands are unremarkable. There is no hydronephrosis on either side. There is symmetric enhancement and excretion of contrast by both kidneys. The visualized ureters and urinary bladder appear unremarkable. Stomach/Bowel: There is apparent focal area of thickening at the gastroesophageal junction (51/2 and coronal  86/4). This may be related to underdistention, however underlying lesion is not excluded. Further evaluation with upper GI study or direct visualization with endoscopy is recommended. There is no bowel obstruction or active inflammation. The appendix is normal. Vascular/Lymphatic: Mild aortoiliac atherosclerotic disease. The IVC is unremarkable. No portal venous gas. There is no adenopathy. Reproductive: The prostate and seminal vesicles are grossly unremarkable. No pelvic mass. Other: None Musculoskeletal: Osteopenia with degenerative changes of the spine. There is a 3.4 x 2.5 cm lytic lesion in the left pubic bone as well as a smaller lytic lesion in the right pubic bone. Additional faint subcentimeter lucencies may be present within the spine. Age indeterminate, old-appearing compression fracture of the inferior endplate of L4. No acute osseous pathology. IMPRESSION: 1. Multiple bilateral pulmonary nodules measuring up to 10 mm in the right middle lobe. These nodules are indeterminate and may be inflammatory/infectious in etiology. However, metastatic disease is not excluded in light of osseous findings. Clinical correlation and multidisciplinary consult advised. 2. Multiple osseous lytic lesions with the largest involving the left pubic bone concerning for metastatic disease. Further evaluation with bone scan recommended. 3. Compression fracture of T9 with approximately 40% loss of vertebral body height, age indeterminate. Correlation with clinical exam and point tenderness recommended. 4. Apparent focal area of thickening at the gastroesophageal junction. Further evaluation with upper GI study or direct visualization with endoscopy is recommended. 5. No bowel obstruction. Normal appendix. 6. Aortic Atherosclerosis (ICD10-I70.0). Electronically Signed   By: Anner Crete M.D.   On: 01/30/2020 20:06   CT BONE MARROW BIOPSY & ASPIRATION  Result Date: 02/12/2020 INDICATION: 64 year old with abnormal serum  protein electrophoresis and lytic bone lesions. Concern for plasma cell disorder. Request for bone marrow biopsy. EXAM: CT GUIDED BONE MARROW ASPIRATES AND BIOPSY Physician: Stephan Minister. Anselm Pancoast, MD MEDICATIONS: None. ANESTHESIA/SEDATION: Fentanyl 100 mcg IV; Versed 2.0 mg IV Moderate Sedation Time:  13 minutes The patient was continuously monitored during the procedure by the interventional radiology nurse under my direct supervision. COMPLICATIONS: None immediate. PROCEDURE: The procedure was explained to the patient. The risks and benefits of the procedure were discussed and the patient's questions were addressed. Informed consent was obtained from the patient. The patient was placed prone on CT table. Images of the pelvis were obtained. The right side of back was prepped and draped in sterile fashion. The skin and right posterior ilium were anesthetized with  1% lidocaine. 11 gauge bone needle was directed into the right ilium with CT guidance. Two aspirates and one core biopsy were obtained. Bandage placed over the puncture site. IMPRESSION: CT guided bone marrow aspiration and core biopsy. Electronically Signed   By: Markus Daft M.D.   On: 02/12/2020 10:45      ASSESSMENT & PLAN:  1. Multiple myeloma not having achieved remission (HCC)   2. Lung nodule   3. Encounter for antineoplastic chemotherapy   4. Hypokalemia   5. Lytic bone lesions on xray   Cancer Staging Multiple myeloma not having achieved remission ([facility]) Staging form: Plasma Cell Myeloma and Plasma Cell Disorders, AJCC 8th Edition - Clinical stage from 02/04/2020: Beta-2-microglobulin (mg/L): 2.1, Albumin (g/dL): 2.7, ISS: Stage II - Signed by Earlie Server, MD on 04/08/2020   #IgG multiple myeloma, stage II 02/27/2020, started on Revlimid 25 mg 2 weeks on 1 week off. Finished cycle 2. Labs reviewed and discussed with patient. Counts acceptable to proceed with Velcade with dexamethasone today. Continue aspirin 81 mg and acyclovir 400 mg twice  daily for shingle prophylaxis Follow-up in 1 week to start next cycles of Revlimid/Velcade  #Back pain due to compression fracture.  Avoid heavy lifting.  Pain has improved #Focal area of thickening at the GE junction.  Discussed with patient.  Establish care with gastroenterology   #Lung nodules, measures up to 10 mm on the right middle lobe.  Indeterminate.  Attention on follow-up.  Obtain CT chest in January/February 2022. #Hypercalcemia, likely secondary to multiple myeloma.  Zometa on 02/29/2020 and 03/18/2020.   Calcium has normalized.  Plan Zometa every 4-6 weeks  .  Patient understands the potential side effects of bisphosphonate.  He waives dental clearance. Advised patient to start calcium 400 mg daily.   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  Hemoglobin stable. # hypokalemia, recommend patient to finish his supply of potassium chloride 20 mEq daily.  He has 4 to 5 tablets left. After that recommend patient to take potassium chloride 10 mEq daily.  Supportive care measures are necessary for patient well-being and will be provided as necessary. We spent sufficient time to discuss many aspect of care, questions were answered to patient's satisfaction.  All questions were answered. The patient knows to call the clinic with any problems questions or concerns.  cc Sofie Hartigan, MD    Return of visit: 1 week  Earlie Server, MD, PhD Hematology Oncology [facility] Pager- [phone] 04/08/2020

## 2020-04-08 NOTE — Progress Notes (Signed)
Patient denies new problems/concerns today.   °

## 2020-04-09 ENCOUNTER — Other Ambulatory Visit: Payer: Self-pay | Admitting: Oncology

## 2020-04-09 LAB — KAPPA/LAMBDA LIGHT CHAINS
Kappa free light chain: 45.3 mg/L — ABNORMAL HIGH (ref 3.3–19.4)
Kappa, lambda light chain ratio: 7.19 — ABNORMAL HIGH (ref 0.26–1.65)
Lambda free light chains: 6.3 mg/L (ref 5.7–26.3)

## 2020-04-09 LAB — IGG: IgG (Immunoglobin G), Serum: 2077 mg/dL — ABNORMAL HIGH (ref 603–1613)

## 2020-04-09 LAB — PROTEIN ELECTROPHORESIS, SERUM
A/G Ratio: 0.8 (ref 0.7–1.7)
Albumin ELP: 3.1 g/dL (ref 2.9–4.4)
Alpha-1-Globulin: 0.2 g/dL (ref 0.0–0.4)
Alpha-2-Globulin: 0.7 g/dL (ref 0.4–1.0)
Beta Globulin: 2.7 g/dL — ABNORMAL HIGH (ref 0.7–1.3)
Gamma Globulin: 0.1 g/dL — ABNORMAL LOW (ref 0.4–1.8)
Globulin, Total: 3.8 g/dL (ref 2.2–3.9)
M-Spike, %: 2.4 g/dL — ABNORMAL HIGH
Total Protein ELP: 6.9 g/dL (ref 6.0–8.5)

## 2020-04-15 ENCOUNTER — Inpatient Hospital Stay: Payer: Medicare Other

## 2020-04-15 ENCOUNTER — Other Ambulatory Visit: Payer: Self-pay

## 2020-04-15 ENCOUNTER — Inpatient Hospital Stay: Payer: Medicare Other | Attending: Oncology

## 2020-04-15 ENCOUNTER — Inpatient Hospital Stay (HOSPITAL_BASED_OUTPATIENT_CLINIC_OR_DEPARTMENT_OTHER): Payer: Medicare Other | Admitting: Oncology

## 2020-04-15 ENCOUNTER — Encounter: Payer: Self-pay | Admitting: Oncology

## 2020-04-15 VITALS — BP 122/76 | HR 87 | Temp 98.4°F | Resp 18 | Wt 243.2 lb

## 2020-04-15 DIAGNOSIS — E291 Testicular hypofunction: Secondary | ICD-10-CM | POA: Insufficient documentation

## 2020-04-15 DIAGNOSIS — E876 Hypokalemia: Secondary | ICD-10-CM

## 2020-04-15 DIAGNOSIS — M545 Low back pain, unspecified: Secondary | ICD-10-CM | POA: Diagnosis not present

## 2020-04-15 DIAGNOSIS — Z5111 Encounter for antineoplastic chemotherapy: Secondary | ICD-10-CM

## 2020-04-15 DIAGNOSIS — Z7982 Long term (current) use of aspirin: Secondary | ICD-10-CM | POA: Insufficient documentation

## 2020-04-15 DIAGNOSIS — F17210 Nicotine dependence, cigarettes, uncomplicated: Secondary | ICD-10-CM | POA: Insufficient documentation

## 2020-04-15 DIAGNOSIS — I10 Essential (primary) hypertension: Secondary | ICD-10-CM | POA: Diagnosis not present

## 2020-04-15 DIAGNOSIS — C9000 Multiple myeloma not having achieved remission: Secondary | ICD-10-CM

## 2020-04-15 DIAGNOSIS — R918 Other nonspecific abnormal finding of lung field: Secondary | ICD-10-CM | POA: Diagnosis not present

## 2020-04-15 DIAGNOSIS — M899 Disorder of bone, unspecified: Secondary | ICD-10-CM | POA: Diagnosis not present

## 2020-04-15 DIAGNOSIS — I700 Atherosclerosis of aorta: Secondary | ICD-10-CM | POA: Insufficient documentation

## 2020-04-15 DIAGNOSIS — Z79899 Other long term (current) drug therapy: Secondary | ICD-10-CM | POA: Diagnosis not present

## 2020-04-15 DIAGNOSIS — F329 Major depressive disorder, single episode, unspecified: Secondary | ICD-10-CM | POA: Diagnosis not present

## 2020-04-15 DIAGNOSIS — R911 Solitary pulmonary nodule: Secondary | ICD-10-CM

## 2020-04-15 LAB — COMPREHENSIVE METABOLIC PANEL
ALT: 19 U/L (ref 0–44)
AST: 16 U/L (ref 15–41)
Albumin: 3.2 g/dL — ABNORMAL LOW (ref 3.5–5.0)
Alkaline Phosphatase: 60 U/L (ref 38–126)
Anion gap: 10 (ref 5–15)
BUN: 9 mg/dL (ref 8–23)
CO2: 26 mmol/L (ref 22–32)
Calcium: 9.2 mg/dL (ref 8.9–10.3)
Chloride: 99 mmol/L (ref 98–111)
Creatinine, Ser: 0.82 mg/dL (ref 0.61–1.24)
GFR, Estimated: >60 mL/min (ref >60)
Glucose, Bld: 96 mg/dL (ref 70–99)
Potassium: 2.9 mmol/L — ABNORMAL LOW (ref 3.5–5.1)
Sodium: 135 mmol/L (ref 135–145)
Total Bilirubin: 0.7 mg/dL (ref 0.3–1.2)
Total Protein: 7.3 g/dL (ref 6.5–8.1)

## 2020-04-15 LAB — CBC WITH DIFFERENTIAL/PLATELET
Abs Immature Granulocytes: 0.01 10*3/uL (ref 0.00–0.07)
Basophils Absolute: 0 10*3/uL (ref 0.0–0.1)
Basophils Relative: 1 %
Eosinophils Absolute: 0.7 10*3/uL — ABNORMAL HIGH (ref 0.0–0.5)
Eosinophils Relative: 16 %
HCT: 37 % — ABNORMAL LOW (ref 39.0–52.0)
Hemoglobin: 12.2 g/dL — ABNORMAL LOW (ref 13.0–17.0)
Immature Granulocytes: 0 %
Lymphocytes Relative: 22 %
Lymphs Abs: 1 10*3/uL (ref 0.7–4.0)
MCH: 30 pg (ref 26.0–34.0)
MCHC: 33 g/dL (ref 30.0–36.0)
MCV: 90.9 fL (ref 80.0–100.0)
Monocytes Absolute: 0.5 10*3/uL (ref 0.1–1.0)
Monocytes Relative: 12 %
Neutro Abs: 2.1 10*3/uL (ref 1.7–7.7)
Neutrophils Relative %: 49 %
Platelets: 141 10*3/uL — ABNORMAL LOW (ref 150–400)
RBC: 4.07 MIL/uL — ABNORMAL LOW (ref 4.22–5.81)
RDW: 16.5 % — ABNORMAL HIGH (ref 11.5–15.5)
WBC: 4.4 10*3/uL (ref 4.0–10.5)
nRBC: 0 % (ref 0.0–0.2)

## 2020-04-15 MED ORDER — SODIUM CHLORIDE 0.9 % IV SOLN
Freq: Once | INTRAVENOUS | Status: AC
  Filled 2020-04-15: qty 250

## 2020-04-15 MED ORDER — POTASSIUM CHLORIDE 10 MEQ/100ML IV SOLN: 10.0000 meq | Freq: Once | INTRAVENOUS | Status: DC

## 2020-04-15 MED ORDER — BORTEZOMIB CHEMO SQ INJECTION 3.5 MG (2.5MG/ML)
1.3000 mg/m2 | Freq: Once | INTRAMUSCULAR | Status: AC
  Administered 2020-04-15: 3.25 mg via SUBCUTANEOUS
  Filled 2020-04-15: qty 1.3

## 2020-04-15 MED ORDER — POTASSIUM CHLORIDE IN NACL 20-0.9 MEQ/L-% IV SOLN
Freq: Once | INTRAVENOUS | Status: AC
  Filled 2020-04-15: qty 1000

## 2020-04-15 MED ORDER — ZOLEDRONIC ACID 4 MG/100ML IV SOLN
4.0000 mg | Freq: Once | INTRAVENOUS | Status: AC
  Administered 2020-04-15: 4 mg via INTRAVENOUS
  Filled 2020-04-15: qty 100

## 2020-04-15 MED ORDER — DEXAMETHASONE 4 MG PO TABS
20.0000 mg | ORAL_TABLET | Freq: Once | ORAL | Status: AC
  Administered 2020-04-15: 20 mg via ORAL
  Filled 2020-04-15: qty 5

## 2020-04-15 MED ORDER — POTASSIUM CHLORIDE CRYS ER 20 MEQ PO TBCR: 20.0000 meq | EXTENDED_RELEASE_TABLET | Freq: Every day | ORAL | 0 refills | Status: DC

## 2020-04-15 NOTE — Progress Notes (Signed)
Hematology/Oncology follow up note [facility] Telephone:(336) [phone] [phone]   Patient Care Team: Tommy Hartigan, MD as PCP - General (Family Medicine) Noreene Filbert, MD as Radiation Oncologist (Radiation Oncology)  REFERRING PROVIDER: Sofie Hartigan, MD  CHIEF COMPLAINTS/REASON FOR VISIT:  Follow-up for multiple myeloma HISTORY OF PRESENTING ILLNESS:   Tommy Trickett. is a  65 y.o.  male with PMH listed below was seen in consultation at the request of  Tommy Hartigan, MD  for evaluation of abnormal serum protein electrophoresis.  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  Increase calcium level at 12.3, PTH was normal.  Creatinine 1, estimated GFR 91, CBC showed hemoglobin 12.4, Patient also recently had a CT chest abdomen pelvis with contrast for evaluation of right flank pain and right lower lobe nodule. 01/29/2020, CT showed multiple bilateral pulmonary nodules measuring up to 10 mm in the right middle lobe, nodules are indeterminate, infectious/inflammatory etiology versus metastatic disease. Multiple osseous lytic lesions, largest lesion involving the left pubic bone concerning for metastatic disease. Compression fractures at T9, age indeterminate. Appearance focal area of thickening at the gastroesophageal junction.  Further evaluation with upper GI study or direct visualization with endoscopy is recommended No bowel obstruction. Aortic atherosclerosis.  Patient reports NSAIDs as needed for lower back pain.  Mid lower back pain usually is worse when when he stands up from sitting position.  Patient works in the [employer].  Lives at home with wife.  Denies any unintentional weight loss, fever, chills, night sweating  # IgG multiple myeloma, stage II Baseline M protein 4.1, kappa light chain level 333.8 Beta-2 microglobulin 2.1, albumin 2.7. 02/12/2020, bone marrow biopsy showed  hypercellular for age, 80% plasma cell which are complex restricted by light chain in situ heparinization.  Background hematopoiesis is present but reduced. Cytogenetics is normal, Myeloma FISH panel-deletion 1P,Standard risk.  # 02/27/2020 patient has been started on Revlimid 25 mg D1-14 #02/24/2020, Velcade weekly with dexamethasone 20 mg weekly INTERVAL HISTORY Tommy Provencio. is a 65 y.o. male who has above history reviewed by me today presents for follow up visit for multiple myeloma/evaluation prior to chemotherapy. Problems and complaints are listed below: Marland Kitchen  During last visit, we discussed about delaying the start of Revlimid, attempting to synchronize Revlimid treatment along with Velcade treatments.  Due to misunderstanding, patient started Revlimid on 12/31 2021. Otherwise no new complaints.   Review of Systems  Constitutional: Negative for appetite change, chills, fatigue, fever and unexpected weight change.  HENT:   Negative for hearing loss and voice change.   Eyes: Negative for eye problems and icterus.  Respiratory: Negative for chest tightness, cough and shortness of breath.   Cardiovascular: Negative for chest pain and leg swelling.  Gastrointestinal: Negative for abdominal distention and abdominal pain.  Endocrine: Negative for hot flashes.  Genitourinary: Negative for difficulty urinating, dysuria and frequency.   Musculoskeletal: Negative for arthralgias, back pain and neck pain.  Skin: Negative for itching and rash.  Neurological: Negative for light-headedness and numbness.  Hematological: Negative for adenopathy. Does not bruise/bleed easily.  Psychiatric/Behavioral: Negative for confusion.     MEDICAL HISTORY:  Past Medical History:  Diagnosis Date  . Depression   . Hypercalcemia 03/02/2020  . Hyperlipemia   . Hypertension   . Hypogonadism in male     SURGICAL HISTORY: Past Surgical History:  Procedure Laterality Date  . COLONOSCOPY  07/14/2008     SOCIAL HISTORY: Social History   Socioeconomic  History  . Marital status: Married    Spouse name: Not on file  . Number of children: Not on file  . Years of education: Not on file  . Highest education level: Not on file  Occupational History  . Not on file  Tobacco Use  . Smoking status: Current Every Day Smoker    Packs/day: 1.00    Years: 30.00    Pack years: 30.00    Types: Cigarettes  . Smokeless tobacco: Never Used  Substance and Sexual Activity  . Alcohol use: Yes    Comment: occcassional  . Drug use: No  . Sexual activity: Not on file  Other Topics Concern  . Not on file  Social History Narrative  . Not on file   Social Determinants of Health   Financial Resource Strain: Not on file  Food Insecurity: Not on file  Transportation Needs: Not on file  Physical Activity: Not on file  Stress: Not on file  Social Connections: Not on file  Intimate Partner Violence: Not on file    FAMILY HISTORY: Family History  Problem Relation Age of Onset  . Skin cancer Mother   . Prostate cancer Father     ALLERGIES:  has No Known Allergies.  MEDICATIONS:  Current Outpatient Medications  Medication Sig Dispense Refill  . acyclovir (ZOVIRAX) 400 MG tablet Take 1 tablet (400 mg total) by mouth 2 (two) times daily. 60 tablet 3  . amLODipine (NORVASC) 10 MG tablet Take by mouth.    . ASPIRIN 81 PO Take 81 mg by mouth daily.    Marland Kitchen atorvastatin (LIPITOR) 40 MG tablet Take 40 mg by mouth daily.    Marland Kitchen buPROPion (WELLBUTRIN XL) 150 MG 24 hr tablet Take by mouth.    . lenalidomide (REVLIMID) 25 MG capsule Take 1 capsule (25 mg total) by mouth daily. Take 14 days on, 7 days off, repeat every 21 days. 14 capsule 0  . losartan-hydrochlorothiazide (HYZAAR) 100-12.5 MG tablet Take 1 tablet by mouth daily.    . polyethylene glycol (MIRALAX) 17 g packet Take 17 g by mouth daily. 14 each 0  . Psyllium (METAMUCIL MULTIHEALTH FIBER PO) Take by mouth daily.    Marland Kitchen dutasteride (AVODART) 0.5  MG capsule Take by mouth.    Marland Kitchen omeprazole (PRILOSEC) 20 MG capsule Take 1 capsule (20 mg total) by mouth daily. (Patient not taking: Reported on 04/15/2020) 30 capsule 1  . ondansetron (ZOFRAN) 8 MG tablet Take 1 tablet (8 mg total) by mouth 2 (two) times daily as needed (Nausea or vomiting). (Patient not taking: Reported on 04/15/2020) 30 tablet 1  . potassium chloride SA (KLOR-CON) 20 MEQ tablet Take 1 tablet (20 mEq total) by mouth daily. 45 tablet 0  . senna-docusate (SENOKOT-S) 8.6-50 MG tablet Take 2 tablets by mouth daily. (Patient not taking: No sig reported) 60 tablet 0   No current facility-administered medications for this visit.     PHYSICAL EXAMINATION: ECOG PERFORMANCE STATUS: 1 - Symptomatic but completely ambulatory Vitals:   04/15/20 1108  BP: 122/76  Pulse: 87  Resp: 18  Temp: 98.4 F (36.9 C)   Filed Weights   04/15/20 1108  Weight: 243 lb 3.2 oz (110.3 kg)    Physical Exam Constitutional:      General: He is not in acute distress.    Appearance: He is not diaphoretic.  HENT:     Head: Normocephalic and atraumatic.     Nose: Nose normal.     Mouth/Throat:  Pharynx: No oropharyngeal exudate.  Eyes:     General: No scleral icterus.    Pupils: Pupils are equal, round, and reactive to light.  Cardiovascular:     Rate and Rhythm: Normal rate and regular rhythm.     Heart sounds: No murmur heard.   Pulmonary:     Effort: Pulmonary effort is normal. No respiratory distress.     Breath sounds: No rales.  Chest:     Chest wall: No tenderness.  Abdominal:     General: There is no distension.     Palpations: Abdomen is soft.     Tenderness: There is no abdominal tenderness.  Musculoskeletal:        General: Normal range of motion.     Cervical back: Normal range of motion and neck supple.  Skin:    General: Skin is warm and dry.     Findings: No erythema.  Neurological:     Mental Status: He is alert and oriented to person, place, and time.      Cranial Nerves: No cranial nerve deficit.     Motor: No abnormal muscle tone.     Coordination: Coordination normal.  Psychiatric:        Mood and Affect: Affect normal.       LABORATORY DATA:  I have reviewed the data as listed Lab Results  Component Value Date   WBC 4.4 04/15/2020   HGB 12.2 (L) 04/15/2020   HCT 37.0 (L) 04/15/2020   MCV 90.9 04/15/2020   PLT 141 (L) 04/15/2020   Recent Labs    04/01/20 0848 04/08/20 1259 04/15/20 1032  NA 133* 136 135  K 3.4* 3.2* 2.9*  CL 99 101 99  CO2 _0 GLUCOSE 107* 88 96  BUN _1 CREATININE 0.94 0.83 0.82  CALCIUM 9.7 9.4 9.2  GFRNONAA >60 >60 >60  PROT 8.0 7.4 7.3  ALBUMIN 2.9* 2.9* 3.2*  AST _2 ALT _3 ALKPHOS 57 59 60  BILITOT 0.8 1.0 0.7   Iron/TIBC/Ferritin/ %Sat No results found for: IRON, TIBC, FERRITIN, IRONPCTSAT    RADIOGRAPHIC STUDIES: I have personally reviewed the radiological images as listed and agreed with the findings in the report. CT CHEST W CONTRAST  Result Date: 01/30/2020 CLINICAL DATA:  65 year old male with right flank pain. Right lower lobe pulmonary nodule. EXAM: CT CHEST, ABDOMEN, AND PELVIS WITH CONTRAST TECHNIQUE: Multidetector CT imaging of the chest, abdomen and pelvis was performed following the standard protocol during bolus administration of intravenous contrast. CONTRAST:  167m OMNIPAQUE IOHEXOL 300 MG/ML  SOLN COMPARISON:  None. FINDINGS: CT CHEST FINDINGS Cardiovascular: There is no cardiomegaly or pericardial effusion. The thoracic aorta is unremarkable. The origins of the great vessels of the aortic arch appear patent as visualized. The central pulmonary arteries are patent. Mediastinum/Nodes: There is no hilar or mediastinal adenopathy. The esophagus and the thyroid gland are grossly unremarkable. No mediastinal fluid collection. Lungs/Pleura: There are bibasilar linear atelectasis/scarring. Multiple pulmonary nodules predominantly involving the lower lungs  including a 10 mm right middle lobe nodule (98/3), a 6 mm right lower lobe subpleural nodule (93/3), a 4 mm right middle lobe nodule (80/3), a 4 mm lingular nodule (67/3), and a 3 mm left lower lobe subpleural nodule (114/3). No focal consolidation, pleural effusion, or pneumothorax. The central airways are patent. Musculoskeletal: Old healed right anterior sixth rib fracture. There is a small indeterminate lucency in the anterior aspect of the right sixth  rib anterior to the old fracture (93/3) which is not evaluated on this CT. Clinical correlation and attention on follow-up recommended. If there is history of known primary cancer or other concern for metastatic disease, bone scan may provide better evaluation. Several additional faint lucency is noted in the sternum. There is compression fracture of T9 with approximately 40% loss of vertebral body height, age indeterminate. Correlation with clinical exam and point tenderness recommended. There is mild perivertebral edema at this level and therefore an acute or subacute fracture is a possibility. No retropulsed fragment. CT ABDOMEN PELVIS FINDINGS No intra-abdominal free air or free fluid. Hepatobiliary: The liver is unremarkable. No intrahepatic biliary ductal dilatation. The gallbladder is unremarkable. Pancreas: Unremarkable. No pancreatic ductal dilatation or surrounding inflammatory changes. Spleen: Normal in size without focal abnormality. Adrenals/Urinary Tract: The adrenal glands are unremarkable. There is no hydronephrosis on either side. There is symmetric enhancement and excretion of contrast by both kidneys. The visualized ureters and urinary bladder appear unremarkable. Stomach/Bowel: There is apparent focal area of thickening at the gastroesophageal junction (51/2 and coronal 86/4). This may be related to underdistention, however underlying lesion is not excluded. Further evaluation with upper GI study or direct visualization with endoscopy is  recommended. There is no bowel obstruction or active inflammation. The appendix is normal. Vascular/Lymphatic: Mild aortoiliac atherosclerotic disease. The IVC is unremarkable. No portal venous gas. There is no adenopathy. Reproductive: The prostate and seminal vesicles are grossly unremarkable. No pelvic mass. Other: None Musculoskeletal: Osteopenia with degenerative changes of the spine. There is a 3.4 x 2.5 cm lytic lesion in the left pubic bone as well as a smaller lytic lesion in the right pubic bone. Additional faint subcentimeter lucencies may be present within the spine. Age indeterminate, old-appearing compression fracture of the inferior endplate of L4. No acute osseous pathology. IMPRESSION: 1. Multiple bilateral pulmonary nodules measuring up to 10 mm in the right middle lobe. These nodules are indeterminate and may be inflammatory/infectious in etiology. However, metastatic disease is not excluded in light of osseous findings. Clinical correlation and multidisciplinary consult advised. 2. Multiple osseous lytic lesions with the largest involving the left pubic bone concerning for metastatic disease. Further evaluation with bone scan recommended. 3. Compression fracture of T9 with approximately 40% loss of vertebral body height, age indeterminate. Correlation with clinical exam and point tenderness recommended. 4. Apparent focal area of thickening at the gastroesophageal junction. Further evaluation with upper GI study or direct visualization with endoscopy is recommended. 5. No bowel obstruction. Normal appendix. 6. Aortic Atherosclerosis (ICD10-I70.0). Electronically Signed   By: Anner Crete M.D.   On: 01/30/2020 20:06   MR Cervical Spine W Wo Contrast  Result Date: 02/22/2020 CLINICAL DATA:  Multiple myeloma and back pain. Lytic disease on previous imaging. EXAM: MRI CERVICAL SPINE WITHOUT AND WITH CONTRAST TECHNIQUE: Multiplanar and multiecho pulse sequences of the cervical spine, to  include the craniocervical junction and cervicothoracic junction, were obtained without and with intravenous contrast. CONTRAST:  49m GADAVIST GADOBUTROL 1 MMOL/ML IV SOLN COMPARISON:  CT 09/06/2016 FINDINGS: Alignment: Normal Vertebrae: Normal. No evidence of myeloma involvement in the cervical region. No regional fracture or lytic lesion. Cord: No cord compression or primary cord lesion. Posterior Fossa, vertebral arteries, paraspinal tissues: Negative Disc levels: No significant disc pathology. Minimal non-compressive disc bulges at C5-6 and C6-7. Minimal facet osteoarthritis. No compressive narrowing of the canal or foramina. IMPRESSION: 1. No evidence of myeloma involvement of the cervical region. 2. Minimal non-compressive disc bulges at C5-6  and C6-7. Electronically Signed   By: Nelson Chimes M.D.   On: 02/22/2020 12:30   MR Lumbar Spine W Wo Contrast  Result Date: 02/22/2020 CLINICAL DATA:  Myeloma and back pain. EXAM: MRI LUMBAR SPINE WITHOUT AND WITH CONTRAST TECHNIQUE: Multiplanar and multiecho pulse sequences of the lumbar spine were obtained without and with intravenous contrast. CONTRAST:  75m GADAVIST GADOBUTROL 1 MMOL/ML IV SOLN COMPARISON:  CT 01/29/2020 FINDINGS: Segmentation:  5 lumbar type vertebral bodies. Alignment:  Normal Vertebrae: No evidence of focal marrow space lesion or definite lytic lesion to suggest myeloma involvement. The patient does have acute or subacute minor superior endplate fractures at TT01and L1. No imaging finding to suggest that these are anything other than benign osteoporotic fractures. Minor endplate deformity inferior L4 which was present on the previous CT could be old or subacute. This does not appear progressive. Conus medullaris and cauda equina: Conus extends to the L1 level. Conus and cauda equina appear normal. Paraspinal and other soft tissues: Negative Disc levels: T12-L1: Mild disc bulge.  No compressive stenosis. L1-2: Negative disc space. L2-3:  Bulging of the disc. Slight indentation of the thecal sac but no compressive stenosis. L3-4: Bulging of the disc. Annular fissure. No compressive stenosis. L4-5: Central disc protrusion. Mild facet and ligamentous prominence. Stenosis of both lateral recesses with some potential to affect either L5 nerve. L5-S1: Mild disc bulge without compressive stenosis. IMPRESSION: 1. No definite evidence of myeloma involvement of the lumbar spine. 2. Minor superior endplate fractures at TX79and L1 which appear to be acute or subacute. These are not visible on the previous CT and therefore are new since 01/29/2020. There is no finding to suggest that these are anything other than benign osteoporotic fractures. 3. Minor inferior endplate deformity at L4 which was present on the previous CT could be old or subacute. Mild residual edema at that location which could indicate ongoing healing or relate to the endplate deformity. 4. Lumbar degenerative disc disease most notable at L4-5 where there is a central disc protrusion. Mild facet and ligamentous prominence. Mild stenosis of the lateral recesses with some potential to affect either L5 nerve. Electronically Signed   By: MNelson ChimesM.D.   On: 02/22/2020 12:36   CT ABDOMEN PELVIS W CONTRAST  Result Date: 01/30/2020 CLINICAL DATA:  65year old male with right flank pain. Right lower lobe pulmonary nodule. EXAM: CT CHEST, ABDOMEN, AND PELVIS WITH CONTRAST TECHNIQUE: Multidetector CT imaging of the chest, abdomen and pelvis was performed following the standard protocol during bolus administration of intravenous contrast. CONTRAST:  1051mOMNIPAQUE IOHEXOL 300 MG/ML  SOLN COMPARISON:  None. FINDINGS: CT CHEST FINDINGS Cardiovascular: There is no cardiomegaly or pericardial effusion. The thoracic aorta is unremarkable. The origins of the great vessels of the aortic arch appear patent as visualized. The central pulmonary arteries are patent. Mediastinum/Nodes: There is no hilar or  mediastinal adenopathy. The esophagus and the thyroid gland are grossly unremarkable. No mediastinal fluid collection. Lungs/Pleura: There are bibasilar linear atelectasis/scarring. Multiple pulmonary nodules predominantly involving the lower lungs including a 10 mm right middle lobe nodule (98/3), a 6 mm right lower lobe subpleural nodule (93/3), a 4 mm right middle lobe nodule (80/3), a 4 mm lingular nodule (67/3), and a 3 mm left lower lobe subpleural nodule (114/3). No focal consolidation, pleural effusion, or pneumothorax. The central airways are patent. Musculoskeletal: Old healed right anterior sixth rib fracture. There is a small indeterminate lucency in the anterior aspect of the right  sixth rib anterior to the old fracture (93/3) which is not evaluated on this CT. Clinical correlation and attention on follow-up recommended. If there is history of known primary cancer or other concern for metastatic disease, bone scan may provide better evaluation. Several additional faint lucency is noted in the sternum. There is compression fracture of T9 with approximately 40% loss of vertebral body height, age indeterminate. Correlation with clinical exam and point tenderness recommended. There is mild perivertebral edema at this level and therefore an acute or subacute fracture is a possibility. No retropulsed fragment. CT ABDOMEN PELVIS FINDINGS No intra-abdominal free air or free fluid. Hepatobiliary: The liver is unremarkable. No intrahepatic biliary ductal dilatation. The gallbladder is unremarkable. Pancreas: Unremarkable. No pancreatic ductal dilatation or surrounding inflammatory changes. Spleen: Normal in size without focal abnormality. Adrenals/Urinary Tract: The adrenal glands are unremarkable. There is no hydronephrosis on either side. There is symmetric enhancement and excretion of contrast by both kidneys. The visualized ureters and urinary bladder appear unremarkable. Stomach/Bowel: There is apparent  focal area of thickening at the gastroesophageal junction (51/2 and coronal 86/4). This may be related to underdistention, however underlying lesion is not excluded. Further evaluation with upper GI study or direct visualization with endoscopy is recommended. There is no bowel obstruction or active inflammation. The appendix is normal. Vascular/Lymphatic: Mild aortoiliac atherosclerotic disease. The IVC is unremarkable. No portal venous gas. There is no adenopathy. Reproductive: The prostate and seminal vesicles are grossly unremarkable. No pelvic mass. Other: None Musculoskeletal: Osteopenia with degenerative changes of the spine. There is a 3.4 x 2.5 cm lytic lesion in the left pubic bone as well as a smaller lytic lesion in the right pubic bone. Additional faint subcentimeter lucencies may be present within the spine. Age indeterminate, old-appearing compression fracture of the inferior endplate of L4. No acute osseous pathology. IMPRESSION: 1. Multiple bilateral pulmonary nodules measuring up to 10 mm in the right middle lobe. These nodules are indeterminate and may be inflammatory/infectious in etiology. However, metastatic disease is not excluded in light of osseous findings. Clinical correlation and multidisciplinary consult advised. 2. Multiple osseous lytic lesions with the largest involving the left pubic bone concerning for metastatic disease. Further evaluation with bone scan recommended. 3. Compression fracture of T9 with approximately 40% loss of vertebral body height, age indeterminate. Correlation with clinical exam and point tenderness recommended. 4. Apparent focal area of thickening at the gastroesophageal junction. Further evaluation with upper GI study or direct visualization with endoscopy is recommended. 5. No bowel obstruction. Normal appendix. 6. Aortic Atherosclerosis (ICD10-I70.0). Electronically Signed   By: Anner Crete M.D.   On: 01/30/2020 20:06   CT BONE MARROW BIOPSY &  ASPIRATION  Result Date: 02/12/2020 INDICATION: 65 year old with abnormal serum protein electrophoresis and lytic bone lesions. Concern for plasma cell disorder. Request for bone marrow biopsy. EXAM: CT GUIDED BONE MARROW ASPIRATES AND BIOPSY Physician: Stephan Minister. Anselm Pancoast, MD MEDICATIONS: None. ANESTHESIA/SEDATION: Fentanyl 100 mcg IV; Versed 2.0 mg IV Moderate Sedation Time:  13 minutes The patient was continuously monitored during the procedure by the interventional radiology nurse under my direct supervision. COMPLICATIONS: None immediate. PROCEDURE: The procedure was explained to the patient. The risks and benefits of the procedure were discussed and the patient's questions were addressed. Informed consent was obtained from the patient. The patient was placed prone on CT table. Images of the pelvis were obtained. The right side of back was prepped and draped in sterile fashion. The skin and right posterior ilium were anesthetized with  1% lidocaine. 11 gauge bone needle was directed into the right ilium with CT guidance. Two aspirates and one core biopsy were obtained. Bandage placed over the puncture site. IMPRESSION: CT guided bone marrow aspiration and core biopsy. Electronically Signed   By: Markus Daft M.D.   On: 02/12/2020 10:45      ASSESSMENT & PLAN:  1. Multiple myeloma not having achieved remission ([place])   2. Encounter for antineoplastic chemotherapy   3. Lung nodule   4. Lytic bone lesions on xray   Cancer Staging Multiple myeloma not having achieved remission ([place]) Staging form: Plasma Cell Myeloma and Plasma Cell Disorders, AJCC 8th Edition - Clinical stage from 02/04/2020: Beta-2-microglobulin (mg/L): 2.1, Albumin (g/dL): 2.7, ISS: Stage II - Signed by Earlie Server, MD on 04/08/2020   #IgG multiple myeloma, stage II 04/10/2020 started on Revlimid 25 mg 2 weeks on 1 week off.-He will finish this cycle 04/24/2020 Labs reviewed and discussed with patient. Proceed with Velcade today, 04/22/2020,  04/29/2020. I asked patient not to start next cycle of Revlimid until his appointment with me on 05/06/2020  Continue aspirin 81 mg and acyclovir 400 mg twice daily for shingle prophylaxis  #Back pain due to compression fracture.  Avoid heavy lifting.  Pain has improved #Focal area of thickening at the GE junction.  Discussed with patient.  Establish care with gastroenterology   #Lung nodules, measures up to 10 mm on the right middle lobe.  Indeterminate.  Attention on follow-up.  Obtain CT chest in January/February 2022.  #Bone lesion, Zometa every 4 to 6 weeks.  Proceed with Zometa today. Advised patient to start calcium 400 mg daily.   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  Hemoglobin stable. # hypokalemia, no diarrhea symptoms. IV potassium chloride 20 meq x1 today. Advised patient to take potassium chloride 20 mEq twice daily for a week followed by 20 mEq daily.  Refills were sent to pharmacy.  Supportive care measures are necessary for patient well-being and will be provided as necessary. We spent sufficient time to discuss many aspect of care, questions were answered to patient's satisfaction.  All questions were answered. The patient knows to call the clinic with any problems questions or concerns.  cc Tommy Hartigan, MD    Return of visit: Lab/Velcade on 04/22/2020, 04/29/2020, lab MD [facility] on 04/26/2020  Earlie Server, MD, PhD Hematology Oncology [facility] at [facility] Pager- [phone] 04/15/2020

## 2020-04-15 NOTE — Progress Notes (Signed)
Tolerated Velcade SQ, KCl IV and Zometa well. Discharged home in stable condition.

## 2020-04-15 NOTE — Progress Notes (Signed)
Patient has occasional right side rib pain 2/10 at night.

## 2020-04-22 ENCOUNTER — Inpatient Hospital Stay: Payer: Medicare Other

## 2020-04-22 VITALS — BP 119/76 | HR 75 | Temp 98.4°F | Resp 18 | Wt 243.8 lb

## 2020-04-22 DIAGNOSIS — C9000 Multiple myeloma not having achieved remission: Secondary | ICD-10-CM

## 2020-04-22 LAB — CBC WITH DIFFERENTIAL/PLATELET
Abs Immature Granulocytes: 0.01 10*3/uL (ref 0.00–0.07)
Basophils Absolute: 0 10*3/uL (ref 0.0–0.1)
Basophils Relative: 0 %
Eosinophils Absolute: 1.2 10*3/uL — ABNORMAL HIGH (ref 0.0–0.5)
Eosinophils Relative: 23 %
HCT: 37.8 % — ABNORMAL LOW (ref 39.0–52.0)
Hemoglobin: 12.5 g/dL — ABNORMAL LOW (ref 13.0–17.0)
Immature Granulocytes: 0 %
Lymphocytes Relative: 16 %
Lymphs Abs: 0.9 10*3/uL (ref 0.7–4.0)
MCH: 29.9 pg (ref 26.0–34.0)
MCHC: 33.1 g/dL (ref 30.0–36.0)
MCV: 90.4 fL (ref 80.0–100.0)
Monocytes Absolute: 0.8 10*3/uL (ref 0.1–1.0)
Monocytes Relative: 14 %
Neutro Abs: 2.4 10*3/uL (ref 1.7–7.7)
Neutrophils Relative %: 47 %
Platelets: 173 10*3/uL (ref 150–400)
RBC: 4.18 MIL/uL — ABNORMAL LOW (ref 4.22–5.81)
RDW: 16 % — ABNORMAL HIGH (ref 11.5–15.5)
WBC: 5.3 10*3/uL (ref 4.0–10.5)
nRBC: 0 % (ref 0.0–0.2)

## 2020-04-22 LAB — COMPREHENSIVE METABOLIC PANEL
ALT: 16 U/L (ref 0–44)
AST: 14 U/L — ABNORMAL LOW (ref 15–41)
Albumin: 3.1 g/dL — ABNORMAL LOW (ref 3.5–5.0)
Alkaline Phosphatase: 63 U/L (ref 38–126)
Anion gap: 5 (ref 5–15)
BUN: 9 mg/dL (ref 8–23)
CO2: 30 mmol/L (ref 22–32)
Calcium: 9.7 mg/dL (ref 8.9–10.3)
Chloride: 102 mmol/L (ref 98–111)
Creatinine, Ser: 0.83 mg/dL (ref 0.61–1.24)
GFR, Estimated: >60 mL/min (ref >60)
Glucose, Bld: 110 mg/dL — ABNORMAL HIGH (ref 70–99)
Potassium: 3.2 mmol/L — ABNORMAL LOW (ref 3.5–5.1)
Sodium: 137 mmol/L (ref 135–145)
Total Bilirubin: 0.7 mg/dL (ref 0.3–1.2)
Total Protein: 7.3 g/dL (ref 6.5–8.1)

## 2020-04-22 MED ORDER — BORTEZOMIB CHEMO SQ INJECTION 3.5 MG (2.5MG/ML)
1.3000 mg/m2 | Freq: Once | INTRAMUSCULAR | Status: AC
  Administered 2020-04-22: 3.25 mg via SUBCUTANEOUS
  Filled 2020-04-22: qty 1.3

## 2020-04-22 MED ORDER — DEXAMETHASONE 4 MG PO TABS
20.0000 mg | ORAL_TABLET | Freq: Once | ORAL | Status: AC
  Administered 2020-04-22: 20 mg via ORAL
  Filled 2020-04-22: qty 5

## 2020-04-22 NOTE — Progress Notes (Signed)
1137- Potassium: 3.2 today. MD, Dr. Tasia Catchings, notified and aware. Per MD order: okay to proceed with Velcade treatment at this time; no new orders today. Per MD secure chat message: patient is already taking Potassium at home; MD does not want patient to decrease Potassium to once daily, but MD would like patient to continue taking Potassium at home twice daily until she sees him again on 05/06/2020. Patient educated and verbalized understanding.   1214- Patient tolerated Velcade treatment well. Patient stable and discharged to home at this time.

## 2020-04-27 ENCOUNTER — Other Ambulatory Visit: Payer: Self-pay | Admitting: *Deleted

## 2020-04-27 DIAGNOSIS — C9000 Multiple myeloma not having achieved remission: Secondary | ICD-10-CM

## 2020-04-28 MED ORDER — LENALIDOMIDE 25 MG PO CAPS: 25.0000 mg | ORAL_CAPSULE | Freq: Every day | ORAL | 0 refills | Status: DC

## 2020-04-28 NOTE — Telephone Encounter (Signed)
04/15/20 MD note:  I asked patient not to start next cycle of Revlimid until his appointment with me on 05/06/2020  Do you want to wait to send refill or submit now?

## 2020-04-28 NOTE — Addendum Note (Signed)
Addended by: Vanice Sarah on: 04/28/2020 03:10 PM   Modules accepted: Orders

## 2020-04-28 NOTE — Telephone Encounter (Signed)
Resending rx with [facility] info.

## 2020-04-29 ENCOUNTER — Other Ambulatory Visit: Payer: Self-pay

## 2020-04-29 ENCOUNTER — Inpatient Hospital Stay: Payer: Medicare Other

## 2020-04-29 ENCOUNTER — Encounter: Payer: Self-pay | Admitting: Radiation Oncology

## 2020-04-29 ENCOUNTER — Ambulatory Visit
Admission: RE | Admit: 2020-04-29 | Discharge: 2020-04-29 | Disposition: A | Discharge status: Home / Self Care | Payer: Medicare Other | Source: Ambulatory Visit | Attending: Radiation Oncology | Admitting: Radiation Oncology

## 2020-04-29 VITALS — BP 124/73 | HR 76 | Temp 97.0°F | Resp 18 | Wt 247.0 lb

## 2020-04-29 VITALS — BP 111/74 | HR 73 | Temp 97.1°F | Wt 246.4 lb

## 2020-04-29 DIAGNOSIS — I10 Essential (primary) hypertension: Secondary | ICD-10-CM | POA: Insufficient documentation

## 2020-04-29 DIAGNOSIS — C9000 Multiple myeloma not having achieved remission: Secondary | ICD-10-CM | POA: Insufficient documentation

## 2020-04-29 DIAGNOSIS — Z923 Personal history of irradiation: Secondary | ICD-10-CM | POA: Diagnosis not present

## 2020-04-29 LAB — CBC WITH DIFFERENTIAL/PLATELET
Abs Immature Granulocytes: 0.01 10*3/uL (ref 0.00–0.07)
Basophils Absolute: 0 10*3/uL (ref 0.0–0.1)
Basophils Relative: 0 %
Eosinophils Absolute: 0.7 10*3/uL — ABNORMAL HIGH (ref 0.0–0.5)
Eosinophils Relative: 15 %
HCT: 36.8 % — ABNORMAL LOW (ref 39.0–52.0)
Hemoglobin: 12.2 g/dL — ABNORMAL LOW (ref 13.0–17.0)
Immature Granulocytes: 0 %
Lymphocytes Relative: 24 %
Lymphs Abs: 1.1 10*3/uL (ref 0.7–4.0)
MCH: 30 pg (ref 26.0–34.0)
MCHC: 33.2 g/dL (ref 30.0–36.0)
MCV: 90.4 fL (ref 80.0–100.0)
Monocytes Absolute: 0.6 10*3/uL (ref 0.1–1.0)
Monocytes Relative: 13 %
Neutro Abs: 2.3 10*3/uL (ref 1.7–7.7)
Neutrophils Relative %: 48 %
Platelets: 158 10*3/uL (ref 150–400)
RBC: 4.07 MIL/uL — ABNORMAL LOW (ref 4.22–5.81)
RDW: 16.1 % — ABNORMAL HIGH (ref 11.5–15.5)
WBC: 4.7 10*3/uL (ref 4.0–10.5)
nRBC: 0 % (ref 0.0–0.2)

## 2020-04-29 LAB — COMPREHENSIVE METABOLIC PANEL
ALT: 14 U/L (ref 0–44)
AST: 12 U/L — ABNORMAL LOW (ref 15–41)
Albumin: 3.1 g/dL — ABNORMAL LOW (ref 3.5–5.0)
Alkaline Phosphatase: 53 U/L (ref 38–126)
Anion gap: 8 (ref 5–15)
BUN: 12 mg/dL (ref 8–23)
CO2: 25 mmol/L (ref 22–32)
Calcium: 9.2 mg/dL (ref 8.9–10.3)
Chloride: 102 mmol/L (ref 98–111)
Creatinine, Ser: 0.75 mg/dL (ref 0.61–1.24)
GFR, Estimated: >60 mL/min (ref >60)
Glucose, Bld: 106 mg/dL — ABNORMAL HIGH (ref 70–99)
Potassium: 3.3 mmol/L — ABNORMAL LOW (ref 3.5–5.1)
Sodium: 135 mmol/L (ref 135–145)
Total Bilirubin: 0.4 mg/dL (ref 0.3–1.2)
Total Protein: 6.7 g/dL (ref 6.5–8.1)

## 2020-04-29 MED ORDER — DEXAMETHASONE 4 MG PO TABS
20.0000 mg | ORAL_TABLET | Freq: Once | ORAL | Status: AC
  Administered 2020-04-29: 20 mg via ORAL
  Filled 2020-04-29: qty 5

## 2020-04-29 MED ORDER — BORTEZOMIB CHEMO SQ INJECTION 3.5 MG (2.5MG/ML)
1.3000 mg/m2 | Freq: Once | INTRAMUSCULAR | Status: AC
  Administered 2020-04-29: 3.25 mg via SUBCUTANEOUS
  Filled 2020-04-29: qty 1.3

## 2020-04-29 NOTE — Progress Notes (Signed)
Radiation Oncology Follow up Note  Name: Tommy Smith.   Date:   04/29/2020 MRN:  [id number] DOB: 30-Sep-1955    This 65 y.o. male presents to the clinic today for 1 month follow-up status post palliative radiation therapy to his thoracic spine for multiple myeloma.  REFERRING PROVIDER: Sofie Hartigan, MD  HPI: Patient is a 65 year old male now at 1 month having completed palliative radiation therapy to his thoracic spine for myelomatous involvement.  He is achieved excellent palliation with no further pain he is ambulating well he has no focal neurologic deficits.Marland Kitchen  He is currently receiving Revlimid as well as Zometa which she is tolerating well.  COMPLICATIONS OF TREATMENT: none  FOLLOW UP COMPLIANCE: keeps appointments   PHYSICAL EXAM:  BP 111/74 (BP Location: Left Arm, Patient Position: Sitting, Cuff Size: Large)   Pulse 73   Temp (!) 97.1 F (36.2 C) (Tympanic)   Wt 246 lb 6.4 oz (111.8 kg)   BMI 31.64 kg/m  Well-developed well-nourished patient in NAD. HEENT reveals PERLA, EOMI, discs not visualized.  Oral cavity is clear. No oral mucosal lesions are identified. Neck is clear without evidence of cervical or supraclavicular adenopathy. Lungs are clear to A&P. Cardiac examination is essentially unremarkable with regular rate and rhythm without murmur rub or thrill. Abdomen is benign with no organomegaly or masses noted. Motor sensory and DTR levels are equal and symmetric in the upper and lower extremities. Cranial nerves II through XII are grossly intact. Proprioception is intact. No peripheral adenopathy or edema is identified. No motor or sensory levels are noted. Crude visual fields are within normal range.  RADIOLOGY RESULTS: No current films for review  PLAN: Present time is achieved excellent palliation of his back pain.  I am pleased with his overall progress.  I will turn follow-up care over to medical oncology.  We will be happy to reevaluate the patient anytime  should further radiation therapy be indicated.  I would like to take this opportunity to thank you for allowing me to participate in the care of your patient.Noreene Filbert, MD

## 2020-04-29 NOTE — Progress Notes (Signed)
Pt tolerated treatment well. Pt stable at discharge.  

## 2020-04-30 LAB — KAPPA/LAMBDA LIGHT CHAINS
Kappa free light chain: 25.3 mg/L — ABNORMAL HIGH (ref 3.3–19.4)
Kappa, lambda light chain ratio: 3.56 — ABNORMAL HIGH (ref 0.26–1.65)
Lambda free light chains: 7.1 mg/L (ref 5.7–26.3)

## 2020-04-30 LAB — PROTEIN ELECTROPHORESIS, SERUM
A/G Ratio: 1 (ref 0.7–1.7)
Albumin ELP: 3.1 g/dL (ref 2.9–4.4)
Alpha-1-Globulin: 0.2 g/dL (ref 0.0–0.4)
Alpha-2-Globulin: 0.7 g/dL (ref 0.4–1.0)
Beta Globulin: 2 g/dL — ABNORMAL HIGH (ref 0.7–1.3)
Gamma Globulin: 0.2 g/dL — ABNORMAL LOW (ref 0.4–1.8)
Globulin, Total: 3.1 g/dL (ref 2.2–3.9)
M-Spike, %: 1.6 g/dL — ABNORMAL HIGH
Total Protein ELP: 6.2 g/dL (ref 6.0–8.5)

## 2020-04-30 LAB — IGG: IgG (Immunoglobin G), Serum: 1554 mg/dL (ref 603–1613)

## 2020-05-04 ENCOUNTER — Ambulatory Visit: Payer: Medicare Other

## 2020-05-06 ENCOUNTER — Encounter: Payer: Self-pay | Admitting: Oncology

## 2020-05-06 ENCOUNTER — Inpatient Hospital Stay: Payer: Medicare Other

## 2020-05-06 ENCOUNTER — Inpatient Hospital Stay: Payer: Medicare Other | Admitting: Oncology

## 2020-05-06 VITALS — BP 105/80 | HR 72 | Temp 97.7°F | Resp 18 | Wt 242.8 lb

## 2020-05-06 DIAGNOSIS — C9000 Multiple myeloma not having achieved remission: Secondary | ICD-10-CM

## 2020-05-06 DIAGNOSIS — M899 Disorder of bone, unspecified: Secondary | ICD-10-CM | POA: Diagnosis not present

## 2020-05-06 DIAGNOSIS — Z5111 Encounter for antineoplastic chemotherapy: Secondary | ICD-10-CM | POA: Diagnosis not present

## 2020-05-06 DIAGNOSIS — R911 Solitary pulmonary nodule: Secondary | ICD-10-CM | POA: Insufficient documentation

## 2020-05-06 DIAGNOSIS — E876 Hypokalemia: Secondary | ICD-10-CM | POA: Diagnosis not present

## 2020-05-06 DIAGNOSIS — D649 Anemia, unspecified: Secondary | ICD-10-CM

## 2020-05-06 LAB — CBC WITH DIFFERENTIAL/PLATELET
Abs Immature Granulocytes: 0.01 10*3/uL (ref 0.00–0.07)
Basophils Absolute: 0 10*3/uL (ref 0.0–0.1)
Basophils Relative: 0 %
Eosinophils Absolute: 0.2 10*3/uL (ref 0.0–0.5)
Eosinophils Relative: 5 %
HCT: 41.3 % (ref 39.0–52.0)
Hemoglobin: 13.4 g/dL (ref 13.0–17.0)
Immature Granulocytes: 0 %
Lymphocytes Relative: 29 %
Lymphs Abs: 1.4 10*3/uL (ref 0.7–4.0)
MCH: 29.4 pg (ref 26.0–34.0)
MCHC: 32.4 g/dL (ref 30.0–36.0)
MCV: 90.6 fL (ref 80.0–100.0)
Monocytes Absolute: 0.7 10*3/uL (ref 0.1–1.0)
Monocytes Relative: 14 %
Neutro Abs: 2.5 10*3/uL (ref 1.7–7.7)
Neutrophils Relative %: 52 %
Platelets: 180 10*3/uL (ref 150–400)
RBC: 4.56 MIL/uL (ref 4.22–5.81)
RDW: 16.2 % — ABNORMAL HIGH (ref 11.5–15.5)
WBC: 4.8 10*3/uL (ref 4.0–10.5)
nRBC: 0 % (ref 0.0–0.2)

## 2020-05-06 LAB — COMPREHENSIVE METABOLIC PANEL
ALT: 20 U/L (ref 0–44)
AST: 16 U/L (ref 15–41)
Albumin: 3.5 g/dL (ref 3.5–5.0)
Alkaline Phosphatase: 68 U/L (ref 38–126)
Anion gap: 9 (ref 5–15)
BUN: 16 mg/dL (ref 8–23)
CO2: 24 mmol/L (ref 22–32)
Calcium: 9.6 mg/dL (ref 8.9–10.3)
Chloride: 102 mmol/L (ref 98–111)
Creatinine, Ser: 0.75 mg/dL (ref 0.61–1.24)
GFR, Estimated: >60 mL/min (ref >60)
Glucose, Bld: 108 mg/dL — ABNORMAL HIGH (ref 70–99)
Potassium: 3.3 mmol/L — ABNORMAL LOW (ref 3.5–5.1)
Sodium: 135 mmol/L (ref 135–145)
Total Bilirubin: 0.6 mg/dL (ref 0.3–1.2)
Total Protein: 7.3 g/dL (ref 6.5–8.1)

## 2020-05-06 MED ORDER — BORTEZOMIB CHEMO SQ INJECTION 3.5 MG (2.5MG/ML)
1.3000 mg/m2 | Freq: Once | INTRAMUSCULAR | Status: AC
  Administered 2020-05-06: 3.25 mg via SUBCUTANEOUS
  Filled 2020-05-06: qty 1.3

## 2020-05-06 MED ORDER — DEXAMETHASONE 4 MG PO TABS
20.0000 mg | ORAL_TABLET | Freq: Once | ORAL | Status: AC
  Administered 2020-05-06: 20 mg via ORAL
  Filled 2020-05-06: qty 5

## 2020-05-06 MED ORDER — POTASSIUM CHLORIDE CRYS ER 20 MEQ PO TBCR: 20.0000 meq | EXTENDED_RELEASE_TABLET | Freq: Two times a day (BID) | ORAL | 0 refills | Status: DC

## 2020-05-06 NOTE — Progress Notes (Signed)
Pt here for follow up. No new concerns voiced.   

## 2020-05-06 NOTE — Progress Notes (Signed)
Hematology/Oncology follow up note [facility] [phone] [phone]   Patient Care Team: Sofie Hartigan, MD as PCP - General (Family Medicine) Noreene Filbert, MD as Radiation Oncologist (Radiation Oncology)  REFERRING PROVIDER: Sofie Hartigan, MD  CHIEF COMPLAINTS/REASON FOR VISIT:  Follow-up for multiple myeloma HISTORY OF PRESENTING ILLNESS:   Tommy Hershey. is a  65 y.o.  male with PMH listed below was seen in consultation at the request of  Sofie Hartigan, MD  for evaluation of abnormal serum protein electrophoresis.  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  Increase calcium level at 12.3, PTH was normal.  Creatinine 1, estimated GFR 91, CBC showed hemoglobin 12.4, Patient also recently had a CT chest abdomen pelvis with contrast for evaluation of right flank pain and right lower lobe nodule. 01/29/2020, CT showed multiple bilateral pulmonary nodules measuring up to 10 mm in the right middle lobe, nodules are indeterminate, infectious/inflammatory etiology versus metastatic disease. Multiple osseous lytic lesions, largest lesion involving the left pubic bone concerning for metastatic disease. Compression fractures at T9, age indeterminate. Appearance focal area of thickening at the gastroesophageal junction.  Further evaluation with upper GI study or direct visualization with endoscopy is recommended No bowel obstruction. Aortic atherosclerosis.  Patient reports NSAIDs as needed for lower back pain.  Mid lower back pain usually is worse when when he stands up from sitting position.  Patient works in the [employer].  Lives at home with wife.  Denies any unintentional weight loss, fever, chills, night sweating  # IgG multiple myeloma, stage II, del 1 p, high risk Baseline M protein 4.1, kappa light chain level 333.8 Beta-2 microglobulin 2.1, albumin 2.7. 02/12/2020, bone marrow  biopsy showed hypercellular for age, 80% plasma cell which are complex restricted by light chain in situ heparinization.  Background hematopoiesis is present but reduced. Cytogenetics is normal, Myeloma FISH panel-deletion 1P,Standard risk.  # 02/27/2020 patient has been started on Revlimid 25 mg D1-14 #02/24/2020, Velcade weekly with dexamethasone 20 mg weekly INTERVAL HISTORY Tommy Smith. is a 65 y.o. male who has above history reviewed by me today presents for follow up visit for multiple myeloma/evaluation prior to chemotherapy. Problems and complaints are listed below: Patient reports feeling well. No new complaints. Denies any new bone pain.  He tolerates current treatment regimen well.  Denies any neuropathy.   Review of Systems  Constitutional: Negative for appetite change, chills, fatigue, fever and unexpected weight change.  HENT:   Negative for hearing loss and voice change.   Eyes: Negative for eye problems and icterus.  Respiratory: Negative for chest tightness, cough and shortness of breath.   Cardiovascular: Negative for chest pain and leg swelling.  Gastrointestinal: Negative for abdominal distention and abdominal pain.  Endocrine: Negative for hot flashes.  Genitourinary: Negative for difficulty urinating, dysuria and frequency.   Musculoskeletal: Negative for arthralgias, back pain and neck pain.  Skin: Negative for itching and rash.  Neurological: Negative for light-headedness and numbness.  Hematological: Negative for adenopathy. Does not bruise/bleed easily.  Psychiatric/Behavioral: Negative for confusion.     MEDICAL HISTORY:  Past Medical History:  Diagnosis Date  . Depression   . Hypercalcemia 03/02/2020  . Hyperlipemia   . Hypertension   . Hypogonadism in male     SURGICAL HISTORY: Past Surgical History:  Procedure Laterality Date  . COLONOSCOPY  07/14/2008    SOCIAL HISTORY: Social History   Socioeconomic History  . Marital status:  Married  Spouse name: Not on file  . Number of children: Not on file  . Years of education: Not on file  . Highest education level: Not on file  Occupational History  . Not on file  Tobacco Use  . Smoking status: Current Every Day Smoker    Packs/day: 1.00    Years: 30.00    Pack years: 30.00    Types: Cigarettes  . Smokeless tobacco: Never Used  Substance and Sexual Activity  . Alcohol use: Yes    Comment: occcassional  . Drug use: No  . Sexual activity: Not on file  Other Topics Concern  . Not on file  Social History Narrative  . Not on file   Social Determinants of Health   Financial Resource Strain: Not on file  Food Insecurity: Not on file  Transportation Needs: Not on file  Physical Activity: Not on file  Stress: Not on file  Social Connections: Not on file  Intimate Partner Violence: Not on file    FAMILY HISTORY: Family History  Problem Relation Age of Onset  . Skin cancer Mother   . Prostate cancer Father     ALLERGIES:  has No Known Allergies.  MEDICATIONS:  Current Outpatient Medications  Medication Sig Dispense Refill  . acyclovir (ZOVIRAX) 400 MG tablet Take 1 tablet (400 mg total) by mouth 2 (two) times daily. 60 tablet 3  . amLODipine (NORVASC) 10 MG tablet Take by mouth.    . ASPIRIN 81 PO Take 81 mg by mouth daily.    Marland Kitchen atorvastatin (LIPITOR) 40 MG tablet Take 40 mg by mouth daily.    Marland Kitchen buPROPion (WELLBUTRIN XL) 150 MG 24 hr tablet Take by mouth.    . dutasteride (AVODART) 0.5 MG capsule Take by mouth.    . lenalidomide (REVLIMID) 25 MG capsule Take 1 capsule (25 mg total) by mouth daily. Take 14 days on, 7 days off, repeat every 21 days. 14 capsule 0  . losartan-hydrochlorothiazide (HYZAAR) 100-12.5 MG tablet Take 1 tablet by mouth daily.    Marland Kitchen omeprazole (PRILOSEC) 20 MG capsule Take 1 capsule (20 mg total) by mouth daily. 30 capsule 1  . ondansetron (ZOFRAN) 8 MG tablet Take 1 tablet (8 mg total) by mouth 2 (two) times daily as needed  (Nausea or vomiting). 30 tablet 1  . polyethylene glycol (MIRALAX) 17 g packet Take 17 g by mouth daily. 14 each 0  . potassium chloride SA (KLOR-CON) 20 MEQ tablet Take 1 tablet (20 mEq total) by mouth daily. (Patient taking differently: Take 20 mEq by mouth 2 (two) times daily. Per Dr. Tasia Catchings: continue twice daily.) 45 tablet 0  . Psyllium (METAMUCIL MULTIHEALTH FIBER PO) Take by mouth daily.     No current facility-administered medications for this visit.     PHYSICAL EXAMINATION: ECOG PERFORMANCE STATUS: 1 - Symptomatic but completely ambulatory Vitals:   05/06/20 0851  BP: 105/80  Pulse: 72  Resp: 18  Temp: 97.7 F (36.5 C)   Filed Weights   05/06/20 0851  Weight: 242 lb 12.8 oz (110.1 kg)    Physical Exam Constitutional:      General: He is not in acute distress.    Appearance: He is not diaphoretic.  HENT:     Head: Normocephalic and atraumatic.     Nose: Nose normal.     Mouth/Throat:     Pharynx: No oropharyngeal exudate.  Eyes:     General: No scleral icterus.    Pupils: Pupils are equal, round, and  reactive to light.  Cardiovascular:     Rate and Rhythm: Normal rate and regular rhythm.     Heart sounds: No murmur heard.   Pulmonary:     Effort: Pulmonary effort is normal. No respiratory distress.     Breath sounds: No rales.  Chest:     Chest wall: No tenderness.  Abdominal:     General: There is no distension.     Palpations: Abdomen is soft.     Tenderness: There is no abdominal tenderness.  Musculoskeletal:        General: Normal range of motion.     Cervical back: Normal range of motion and neck supple.  Skin:    General: Skin is warm and dry.     Findings: No erythema.  Neurological:     Mental Status: He is alert and oriented to person, place, and time.     Cranial Nerves: No cranial nerve deficit.     Motor: No abnormal muscle tone.     Coordination: Coordination normal.  Psychiatric:        Mood and Affect: Affect normal.        LABORATORY DATA:  I have reviewed the data as listed Lab Results  Component Value Date   WBC 4.7 04/29/2020   HGB 12.2 (L) 04/29/2020   HCT 36.8 (L) 04/29/2020   MCV 90.4 04/29/2020   PLT 158 04/29/2020   Recent Labs    04/15/20 1032 04/22/20 1052 04/29/20 0938  NA 135 137 135  K 2.9* 3.2* 3.3*  CL 99 102 102  CO2 26 30 25   GLUCOSE 96 110* 106*  BUN 9 9 12   CREATININE 0.82 0.83 0.75  CALCIUM 9.2 9.7 9.2  GFRNONAA >60 >60 >60  PROT 7.3 7.3 6.7  ALBUMIN 3.2* 3.1* 3.1*  AST 16 14* 12*  ALT 19 16 14   ALKPHOS 60 63 53  BILITOT 0.7 0.7 0.4   Iron/TIBC/Ferritin/ %Sat No results found for: IRON, TIBC, FERRITIN, IRONPCTSAT    RADIOGRAPHIC STUDIES: I have personally reviewed the radiological images as listed and agreed with the findings in the report. MR Cervical Spine W Wo Contrast  Result Date: 02/22/2020 CLINICAL DATA:  Multiple myeloma and back pain. Lytic disease on previous imaging. EXAM: MRI CERVICAL SPINE WITHOUT AND WITH CONTRAST TECHNIQUE: Multiplanar and multiecho pulse sequences of the cervical spine, to include the craniocervical junction and cervicothoracic junction, were obtained without and with intravenous contrast. CONTRAST:  92m GADAVIST GADOBUTROL 1 MMOL/ML IV SOLN COMPARISON:  CT 09/06/2016 FINDINGS: Alignment: Normal Vertebrae: Normal. No evidence of myeloma involvement in the cervical region. No regional fracture or lytic lesion. Cord: No cord compression or primary cord lesion. Posterior Fossa, vertebral arteries, paraspinal tissues: Negative Disc levels: No significant disc pathology. Minimal non-compressive disc bulges at C5-6 and C6-7. Minimal facet osteoarthritis. No compressive narrowing of the canal or foramina. IMPRESSION: 1. No evidence of myeloma involvement of the cervical region. 2. Minimal non-compressive disc bulges at C5-6 and C6-7. Electronically Signed   By: MNelson ChimesM.D.   On: 02/22/2020 12:30   MR Lumbar Spine W Wo  Contrast  Result Date: 02/22/2020 CLINICAL DATA:  Myeloma and back pain. EXAM: MRI LUMBAR SPINE WITHOUT AND WITH CONTRAST TECHNIQUE: Multiplanar and multiecho pulse sequences of the lumbar spine were obtained without and with intravenous contrast. CONTRAST:  143mGADAVIST GADOBUTROL 1 MMOL/ML IV SOLN COMPARISON:  CT 01/29/2020 FINDINGS: Segmentation:  5 lumbar type vertebral bodies. Alignment:  Normal Vertebrae: No evidence of focal  marrow space lesion or definite lytic lesion to suggest myeloma involvement. The patient does have acute or subacute minor superior endplate fractures at G26 and L1. No imaging finding to suggest that these are anything other than benign osteoporotic fractures. Minor endplate deformity inferior L4 which was present on the previous CT could be old or subacute. This does not appear progressive. Conus medullaris and cauda equina: Conus extends to the L1 level. Conus and cauda equina appear normal. Paraspinal and other soft tissues: Negative Disc levels: T12-L1: Mild disc bulge.  No compressive stenosis. L1-2: Negative disc space. L2-3: Bulging of the disc. Slight indentation of the thecal sac but no compressive stenosis. L3-4: Bulging of the disc. Annular fissure. No compressive stenosis. L4-5: Central disc protrusion. Mild facet and ligamentous prominence. Stenosis of both lateral recesses with some potential to affect either L5 nerve. L5-S1: Mild disc bulge without compressive stenosis. IMPRESSION: 1. No definite evidence of myeloma involvement of the lumbar spine. 2. Minor superior endplate fractures at R48 and L1 which appear to be acute or subacute. These are not visible on the previous CT and therefore are new since 01/29/2020. There is no finding to suggest that these are anything other than benign osteoporotic fractures. 3. Minor inferior endplate deformity at L4 which was present on the previous CT could be old or subacute. Mild residual edema at that location which could  indicate ongoing healing or relate to the endplate deformity. 4. Lumbar degenerative disc disease most notable at L4-5 where there is a central disc protrusion. Mild facet and ligamentous prominence. Mild stenosis of the lateral recesses with some potential to affect either L5 nerve. Electronically Signed   By: Nelson Chimes M.D.   On: 02/22/2020 12:36   CT BONE MARROW BIOPSY & ASPIRATION  Result Date: 02/12/2020 INDICATION: 65 year old with abnormal serum protein electrophoresis and lytic bone lesions. Concern for plasma cell disorder. Request for bone marrow biopsy. EXAM: CT GUIDED BONE MARROW ASPIRATES AND BIOPSY Physician: Stephan Minister. Anselm Pancoast, MD MEDICATIONS: None. ANESTHESIA/SEDATION: Fentanyl 100 mcg IV; Versed 2.0 mg IV Moderate Sedation Time:  13 minutes The patient was continuously monitored during the procedure by the interventional radiology nurse under my direct supervision. COMPLICATIONS: None immediate. PROCEDURE: The procedure was explained to the patient. The risks and benefits of the procedure were discussed and the patient's questions were addressed. Informed consent was obtained from the patient. The patient was placed prone on CT table. Images of the pelvis were obtained. The right side of back was prepped and draped in sterile fashion. The skin and right posterior ilium were anesthetized with 1% lidocaine. 11 gauge bone needle was directed into the right ilium with CT guidance. Two aspirates and one core biopsy were obtained. Bandage placed over the puncture site. IMPRESSION: CT guided bone marrow aspiration and core biopsy. Electronically Signed   By: Markus Daft M.D.   On: 02/12/2020 10:45      ASSESSMENT & PLAN:  1. Multiple myeloma not having achieved remission ([place])   2. Encounter for antineoplastic chemotherapy   3. Hypokalemia   4. Lytic bone lesions on xray   5. Lung nodule   6. Normocytic anemia   Cancer Staging Multiple myeloma not having achieved remission ([place]) Staging form:  Plasma Cell Myeloma and Plasma Cell Disorders, AJCC 8th Edition - Clinical stage from 02/04/2020: Beta-2-microglobulin (mg/L): 2.1, Albumin (g/dL): 2.7, ISS: Stage II, High-risk cytogenetics: Absent, LDH: Unknown - Signed by Earlie Server, MD on 05/06/2020   #IgG multiple myeloma, stage II,  del 1 p, high risk On first-line treatment with RVD.  Labs are reviewed and discussed with patient. Proceed with current cycle of Revlimid 25 mg 2 weeks on 1 week off Proceed with Velcade today,.  05/13/2020, 05/20/2020 Continue aspirin 81 mg and acyclovir 400 mg twice daily for shingle prophylaxis  I discussed with him about bone marrow transplant option.  Patient is not interested at this point and he wishes to continue current treatment.  Plan to complete 8 cycles of RVD repeat bone marrow biopsy afterwards followed by maintenance therapy.  #Back pain due to compression fracture.  Avoid heavy lifting.  Pain has improved #Focal area of thickening at the GE junction.  I recommend GI work-up and patient declined at this point. #Lung nodules, measures up to 10 mm on the right middle lobe.  Indeterminate.  Attention on follow-up.  Recommend repeat CT chest now and he declined.  #Bone lesion, Zometa every 4 to 6 weeks.  Next Zometa 05/27/2020 Continue calcium 400 mg daily.   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  Hemoglobin stable. # hypokalemia, potassium stable at 3.3 Continue potassium chloride 20 mEq twice daily.  We spent sufficient time to discuss many aspect of care, questions were answered to patient's satisfaction.  All questions were answered. The patient knows to call the clinic with any problems questions or concerns.  cc Sofie Hartigan, MD    Return of visit: Lab/Velcade on 1 week in 2 weeks, lab MD Velcade on 05/27/2020  Earlie Server, MD, PhD Hematology Oncology [facility] at [facility] Pager- [phone] 05/06/2020

## 2020-05-06 NOTE — Progress Notes (Signed)
Velcade well tolerated. Discharged home in stable condition.

## 2020-05-07 LAB — KAPPA/LAMBDA LIGHT CHAINS
Kappa free light chain: 20.8 mg/L — ABNORMAL HIGH (ref 3.3–19.4)
Kappa, lambda light chain ratio: 3.59 — ABNORMAL HIGH (ref 0.26–1.65)
Lambda free light chains: 5.8 mg/L (ref 5.7–26.3)

## 2020-05-07 LAB — PROTEIN ELECTROPHORESIS, SERUM
A/G Ratio: 1.1 (ref 0.7–1.7)
Albumin ELP: 3.6 g/dL (ref 2.9–4.4)
Alpha-1-Globulin: 0.2 g/dL (ref 0.0–0.4)
Alpha-2-Globulin: 0.8 g/dL (ref 0.4–1.0)
Beta Globulin: 2.3 g/dL — ABNORMAL HIGH (ref 0.7–1.3)
Gamma Globulin: 0.1 g/dL — ABNORMAL LOW (ref 0.4–1.8)
Globulin, Total: 3.4 g/dL (ref 2.2–3.9)
M-Spike, %: 1.6 g/dL — ABNORMAL HIGH
Total Protein ELP: 7 g/dL (ref 6.0–8.5)

## 2020-05-07 LAB — IGG: IgG (Immunoglobin G), Serum: 1455 mg/dL (ref 603–1613)

## 2020-05-07 NOTE — Addendum Note (Signed)
Addended by: Earlie Server on: 05/07/2020 08:38 AM   Modules accepted: Orders

## 2020-05-12 ENCOUNTER — Other Ambulatory Visit: Payer: Self-pay | Admitting: Oncology

## 2020-05-13 ENCOUNTER — Inpatient Hospital Stay: Payer: Medicare Other

## 2020-05-13 ENCOUNTER — Inpatient Hospital Stay: Payer: Medicare Other | Attending: Oncology

## 2020-05-13 VITALS — BP 103/67 | HR 90 | Temp 98.9°F | Resp 20 | Wt 242.0 lb

## 2020-05-13 DIAGNOSIS — Z79899 Other long term (current) drug therapy: Secondary | ICD-10-CM | POA: Insufficient documentation

## 2020-05-13 DIAGNOSIS — R918 Other nonspecific abnormal finding of lung field: Secondary | ICD-10-CM | POA: Diagnosis not present

## 2020-05-13 DIAGNOSIS — Z5111 Encounter for antineoplastic chemotherapy: Secondary | ICD-10-CM | POA: Insufficient documentation

## 2020-05-13 DIAGNOSIS — E785 Hyperlipidemia, unspecified: Secondary | ICD-10-CM | POA: Diagnosis not present

## 2020-05-13 DIAGNOSIS — E291 Testicular hypofunction: Secondary | ICD-10-CM | POA: Diagnosis not present

## 2020-05-13 DIAGNOSIS — I700 Atherosclerosis of aorta: Secondary | ICD-10-CM | POA: Insufficient documentation

## 2020-05-13 DIAGNOSIS — F17210 Nicotine dependence, cigarettes, uncomplicated: Secondary | ICD-10-CM | POA: Insufficient documentation

## 2020-05-13 DIAGNOSIS — C9000 Multiple myeloma not having achieved remission: Secondary | ICD-10-CM | POA: Insufficient documentation

## 2020-05-13 DIAGNOSIS — M545 Low back pain, unspecified: Secondary | ICD-10-CM | POA: Diagnosis not present

## 2020-05-13 DIAGNOSIS — Z7982 Long term (current) use of aspirin: Secondary | ICD-10-CM | POA: Insufficient documentation

## 2020-05-13 DIAGNOSIS — E876 Hypokalemia: Secondary | ICD-10-CM | POA: Insufficient documentation

## 2020-05-13 DIAGNOSIS — F329 Major depressive disorder, single episode, unspecified: Secondary | ICD-10-CM | POA: Diagnosis not present

## 2020-05-13 DIAGNOSIS — D649 Anemia, unspecified: Secondary | ICD-10-CM | POA: Diagnosis not present

## 2020-05-13 DIAGNOSIS — G629 Polyneuropathy, unspecified: Secondary | ICD-10-CM | POA: Insufficient documentation

## 2020-05-13 DIAGNOSIS — I10 Essential (primary) hypertension: Secondary | ICD-10-CM | POA: Diagnosis not present

## 2020-05-13 LAB — COMPREHENSIVE METABOLIC PANEL
ALT: 18 U/L (ref 0–44)
AST: 13 U/L — ABNORMAL LOW (ref 15–41)
Albumin: 3.2 g/dL — ABNORMAL LOW (ref 3.5–5.0)
Alkaline Phosphatase: 57 U/L (ref 38–126)
Anion gap: 9 (ref 5–15)
BUN: 9 mg/dL (ref 8–23)
CO2: 28 mmol/L (ref 22–32)
Calcium: 9.1 mg/dL (ref 8.9–10.3)
Chloride: 99 mmol/L (ref 98–111)
Creatinine, Ser: 0.71 mg/dL (ref 0.61–1.24)
GFR, Estimated: >60 mL/min (ref >60)
Glucose, Bld: 93 mg/dL (ref 70–99)
Potassium: 3.5 mmol/L (ref 3.5–5.1)
Sodium: 136 mmol/L (ref 135–145)
Total Bilirubin: 0.8 mg/dL (ref 0.3–1.2)
Total Protein: 6.5 g/dL (ref 6.5–8.1)

## 2020-05-13 LAB — CBC WITH DIFFERENTIAL/PLATELET
Abs Immature Granulocytes: 0.02 10*3/uL (ref 0.00–0.07)
Basophils Absolute: 0 10*3/uL (ref 0.0–0.1)
Basophils Relative: 0 %
Eosinophils Absolute: 0.5 10*3/uL (ref 0.0–0.5)
Eosinophils Relative: 11 %
HCT: 36.9 % — ABNORMAL LOW (ref 39.0–52.0)
Hemoglobin: 12.3 g/dL — ABNORMAL LOW (ref 13.0–17.0)
Immature Granulocytes: 1 %
Lymphocytes Relative: 14 %
Lymphs Abs: 0.6 10*3/uL — ABNORMAL LOW (ref 0.7–4.0)
MCH: 29.6 pg (ref 26.0–34.0)
MCHC: 33.3 g/dL (ref 30.0–36.0)
MCV: 88.7 fL (ref 80.0–100.0)
Monocytes Absolute: 0.4 10*3/uL (ref 0.1–1.0)
Monocytes Relative: 10 %
Neutro Abs: 2.8 10*3/uL (ref 1.7–7.7)
Neutrophils Relative %: 64 %
Platelets: 149 10*3/uL — ABNORMAL LOW (ref 150–400)
RBC: 4.16 MIL/uL — ABNORMAL LOW (ref 4.22–5.81)
RDW: 16.1 % — ABNORMAL HIGH (ref 11.5–15.5)
WBC: 4.3 10*3/uL (ref 4.0–10.5)
nRBC: 0 % (ref 0.0–0.2)

## 2020-05-13 MED ORDER — DEXAMETHASONE 4 MG PO TABS
20.0000 mg | ORAL_TABLET | Freq: Once | ORAL | Status: AC
  Administered 2020-05-13: 20 mg via ORAL
  Filled 2020-05-13: qty 5

## 2020-05-13 MED ORDER — BORTEZOMIB CHEMO SQ INJECTION 3.5 MG (2.5MG/ML)
1.3000 mg/m2 | Freq: Once | INTRAMUSCULAR | Status: AC
  Administered 2020-05-13: 3.25 mg via SUBCUTANEOUS
  Filled 2020-05-13: qty 1.3

## 2020-05-13 NOTE — Progress Notes (Signed)
Stable at discharge 

## 2020-05-15 ENCOUNTER — Encounter: Payer: Self-pay | Admitting: Oncology

## 2020-05-17 ENCOUNTER — Other Ambulatory Visit: Payer: Self-pay | Admitting: Oncology

## 2020-05-17 DIAGNOSIS — C9000 Multiple myeloma not having achieved remission: Secondary | ICD-10-CM

## 2020-05-18 ENCOUNTER — Encounter: Payer: Self-pay | Admitting: Pharmacist

## 2020-05-20 ENCOUNTER — Inpatient Hospital Stay: Payer: Medicare Other

## 2020-05-20 ENCOUNTER — Other Ambulatory Visit: Payer: Self-pay | Admitting: *Deleted

## 2020-05-20 VITALS — BP 112/68 | HR 87 | Temp 98.0°F | Resp 18 | Wt 238.2 lb

## 2020-05-20 DIAGNOSIS — C9000 Multiple myeloma not having achieved remission: Secondary | ICD-10-CM | POA: Diagnosis not present

## 2020-05-20 LAB — CBC WITH DIFFERENTIAL/PLATELET
Abs Immature Granulocytes: 0.02 10*3/uL (ref 0.00–0.07)
Basophils Absolute: 0 10*3/uL (ref 0.0–0.1)
Basophils Relative: 0 %
Eosinophils Absolute: 0.6 10*3/uL — ABNORMAL HIGH (ref 0.0–0.5)
Eosinophils Relative: 12 %
HCT: 37.6 % — ABNORMAL LOW (ref 39.0–52.0)
Hemoglobin: 12.4 g/dL — ABNORMAL LOW (ref 13.0–17.0)
Immature Granulocytes: 0 %
Lymphocytes Relative: 26 %
Lymphs Abs: 1.2 10*3/uL (ref 0.7–4.0)
MCH: 29.3 pg (ref 26.0–34.0)
MCHC: 33 g/dL (ref 30.0–36.0)
MCV: 88.9 fL (ref 80.0–100.0)
Monocytes Absolute: 0.8 10*3/uL (ref 0.1–1.0)
Monocytes Relative: 17 %
Neutro Abs: 2.1 10*3/uL (ref 1.7–7.7)
Neutrophils Relative %: 45 %
Platelets: 160 10*3/uL (ref 150–400)
RBC: 4.23 MIL/uL (ref 4.22–5.81)
RDW: 15.9 % — ABNORMAL HIGH (ref 11.5–15.5)
Smear Review: ADEQUATE
WBC: 4.7 10*3/uL (ref 4.0–10.5)
nRBC: 0 % (ref 0.0–0.2)

## 2020-05-20 LAB — COMPREHENSIVE METABOLIC PANEL
ALT: 17 U/L (ref 0–44)
AST: 15 U/L (ref 15–41)
Albumin: 3.2 g/dL — ABNORMAL LOW (ref 3.5–5.0)
Alkaline Phosphatase: 53 U/L (ref 38–126)
Anion gap: 8 (ref 5–15)
BUN: 11 mg/dL (ref 8–23)
CO2: 28 mmol/L (ref 22–32)
Calcium: 9 mg/dL (ref 8.9–10.3)
Chloride: 103 mmol/L (ref 98–111)
Creatinine, Ser: 0.8 mg/dL (ref 0.61–1.24)
GFR, Estimated: >60 mL/min (ref >60)
Glucose, Bld: 99 mg/dL (ref 70–99)
Potassium: 3.1 mmol/L — ABNORMAL LOW (ref 3.5–5.1)
Sodium: 139 mmol/L (ref 135–145)
Total Bilirubin: 1.2 mg/dL (ref 0.3–1.2)
Total Protein: 6.7 g/dL (ref 6.5–8.1)

## 2020-05-20 MED ORDER — BORTEZOMIB CHEMO SQ INJECTION 3.5 MG (2.5MG/ML)
1.3000 mg/m2 | Freq: Once | INTRAMUSCULAR | Status: AC
  Administered 2020-05-20: 3.25 mg via SUBCUTANEOUS
  Filled 2020-05-20: qty 1.3

## 2020-05-20 MED ORDER — LENALIDOMIDE 25 MG PO CAPS: 25.0000 mg | ORAL_CAPSULE | Freq: Every day | ORAL | 0 refills | Status: DC

## 2020-05-20 MED ORDER — DEXAMETHASONE 4 MG PO TABS
20.0000 mg | ORAL_TABLET | Freq: Once | ORAL | Status: AC
  Administered 2020-05-20: 20 mg via ORAL
  Filled 2020-05-20: qty 5

## 2020-05-20 NOTE — Progress Notes (Signed)
Pt tolerated treatment well. Pt stable at discharge.  

## 2020-05-21 NOTE — Progress Notes (Signed)
Called [facility]cade today to find out the status of patient's application for assistance. They have denied him based off his insurance coverage.  Madalyn Rob, CPhT IV Drug Replacement Specialist  [place] Phone: [phone]

## 2020-05-22 NOTE — Progress Notes (Signed)
Left phone number and name with wife to give to patient so he can call to discuss his financial concerns.

## 2020-05-23 ENCOUNTER — Other Ambulatory Visit: Payer: Self-pay | Admitting: Oncology

## 2020-05-27 ENCOUNTER — Inpatient Hospital Stay: Payer: Medicare Other

## 2020-05-27 ENCOUNTER — Inpatient Hospital Stay (HOSPITAL_BASED_OUTPATIENT_CLINIC_OR_DEPARTMENT_OTHER): Payer: Medicare Other

## 2020-05-27 ENCOUNTER — Encounter: Payer: Self-pay | Admitting: Oncology

## 2020-05-27 ENCOUNTER — Inpatient Hospital Stay: Payer: Medicare Other | Admitting: Oncology

## 2020-05-27 ENCOUNTER — Other Ambulatory Visit: Payer: Self-pay

## 2020-05-27 VITALS — BP 111/74 | HR 78 | Temp 98.8°F | Wt 241.6 lb

## 2020-05-27 DIAGNOSIS — C9000 Multiple myeloma not having achieved remission: Secondary | ICD-10-CM

## 2020-05-27 DIAGNOSIS — E876 Hypokalemia: Secondary | ICD-10-CM

## 2020-05-27 DIAGNOSIS — M899 Disorder of bone, unspecified: Secondary | ICD-10-CM | POA: Diagnosis not present

## 2020-05-27 DIAGNOSIS — Z5111 Encounter for antineoplastic chemotherapy: Secondary | ICD-10-CM | POA: Diagnosis not present

## 2020-05-27 DIAGNOSIS — D649 Anemia, unspecified: Secondary | ICD-10-CM

## 2020-05-27 LAB — CBC WITH DIFFERENTIAL/PLATELET
Abs Immature Granulocytes: 0.01 10*3/uL (ref 0.00–0.07)
Basophils Absolute: 0 10*3/uL (ref 0.0–0.1)
Basophils Relative: 0 %
Eosinophils Absolute: 0.2 10*3/uL (ref 0.0–0.5)
Eosinophils Relative: 4 %
HCT: 37.2 % — ABNORMAL LOW (ref 39.0–52.0)
Hemoglobin: 12.4 g/dL — ABNORMAL LOW (ref 13.0–17.0)
Immature Granulocytes: 0 %
Lymphocytes Relative: 27 %
Lymphs Abs: 1.3 10*3/uL (ref 0.7–4.0)
MCH: 29.5 pg (ref 26.0–34.0)
MCHC: 33.3 g/dL (ref 30.0–36.0)
MCV: 88.4 fL (ref 80.0–100.0)
Monocytes Absolute: 0.9 10*3/uL (ref 0.1–1.0)
Monocytes Relative: 19 %
Neutro Abs: 2.4 10*3/uL (ref 1.7–7.7)
Neutrophils Relative %: 50 %
Platelets: 150 10*3/uL (ref 150–400)
RBC: 4.21 MIL/uL — ABNORMAL LOW (ref 4.22–5.81)
RDW: 15.8 % — ABNORMAL HIGH (ref 11.5–15.5)
WBC: 4.7 10*3/uL (ref 4.0–10.5)
nRBC: 0 % (ref 0.0–0.2)

## 2020-05-27 LAB — COMPREHENSIVE METABOLIC PANEL
ALT: 19 U/L (ref 0–44)
AST: 14 U/L — ABNORMAL LOW (ref 15–41)
Albumin: 3.2 g/dL — ABNORMAL LOW (ref 3.5–5.0)
Alkaline Phosphatase: 51 U/L (ref 38–126)
Anion gap: 6 (ref 5–15)
BUN: 12 mg/dL (ref 8–23)
CO2: 27 mmol/L (ref 22–32)
Calcium: 8.8 mg/dL — ABNORMAL LOW (ref 8.9–10.3)
Chloride: 107 mmol/L (ref 98–111)
Creatinine, Ser: 0.68 mg/dL (ref 0.61–1.24)
GFR, Estimated: >60 mL/min (ref >60)
Glucose, Bld: 91 mg/dL (ref 70–99)
Potassium: 3.7 mmol/L (ref 3.5–5.1)
Sodium: 140 mmol/L (ref 135–145)
Total Bilirubin: 0.8 mg/dL (ref 0.3–1.2)
Total Protein: 6.4 g/dL — ABNORMAL LOW (ref 6.5–8.1)

## 2020-05-27 MED ORDER — BORTEZOMIB CHEMO SQ INJECTION 3.5 MG (2.5MG/ML)
1.3000 mg/m2 | Freq: Once | INTRAMUSCULAR | Status: AC
  Administered 2020-05-27: 3.25 mg via SUBCUTANEOUS
  Filled 2020-05-27: qty 1.3

## 2020-05-27 MED ORDER — SODIUM CHLORIDE 0.9 % IV SOLN
Freq: Once | INTRAVENOUS | Status: AC
  Filled 2020-05-27: qty 250

## 2020-05-27 MED ORDER — DEXAMETHASONE 4 MG PO TABS
20.0000 mg | ORAL_TABLET | Freq: Once | ORAL | Status: AC
Start: 2020-05-27 — End: 2020-05-27
  Administered 2020-05-27: 20 mg via ORAL
  Filled 2020-05-27: qty 5

## 2020-05-27 MED ORDER — ZOLEDRONIC ACID 4 MG/100ML IV SOLN
4.0000 mg | Freq: Once | INTRAVENOUS | Status: AC
  Administered 2020-05-27: 4 mg via INTRAVENOUS
  Filled 2020-05-27: qty 100

## 2020-05-27 NOTE — Progress Notes (Signed)
Hematology/Oncology follow up note [facility] Telephone:(336) [phone] Fax:(336) [phone]   Patient Care Team: Sofie Hartigan, MD as PCP - General (Family Medicine) Noreene Filbert, MD as Radiation Oncologist (Radiation Oncology)  REFERRING PROVIDER: Sofie Hartigan, MD  CHIEF COMPLAINTS/REASON FOR VISIT:  Follow-up for multiple myeloma HISTORY OF PRESENTING ILLNESS:   Tommy Tanzi. is a  65 y.o.  male with PMH listed below was seen in consultation at the request of  Sofie Hartigan, MD  for evaluation of abnormal serum protein electrophoresis.  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  Increase calcium level at 12.3, PTH was normal.  Creatinine 1, estimated GFR 91, CBC showed hemoglobin 12.4, Patient also recently had a CT chest abdomen pelvis with contrast for evaluation of right flank pain and right lower lobe nodule. 01/29/2020, CT showed multiple bilateral pulmonary nodules measuring up to 10 mm in the right middle lobe, nodules are indeterminate, infectious/inflammatory etiology versus metastatic disease. Multiple osseous lytic lesions, largest lesion involving the left pubic bone concerning for metastatic disease. Compression fractures at T9, age indeterminate. Appearance focal area of thickening at the gastroesophageal junction.  Further evaluation with upper GI study or direct visualization with endoscopy is recommended No bowel obstruction. Aortic atherosclerosis.  Patient reports NSAIDs as needed for lower back pain.  Mid lower back pain usually is worse when when he stands up from sitting position.  Patient works in the [employer].  Lives at home with wife.  Denies any unintentional weight loss, fever, chills, night sweating  # IgG multiple myeloma, stage II, del 1 p, high risk Baseline M protein 4.1, kappa light chain level 333.8 Beta-2 microglobulin 2.1, albumin 2.7. 02/12/2020, bone marrow  biopsy showed hypercellular for age, 80% plasma cell which are complex restricted by light chain in situ heparinization.  Background hematopoiesis is present but reduced. Cytogenetics is normal, Myeloma FISH panel-deletion 1P,Standard risk.  # 02/27/2020 patient has been started on Revlimid 25 mg D1-14 #02/24/2020, Velcade weekly with dexamethasone 20 mg weekly  Patient does not want to consider bone marrow transplant at this point.  #Focal area of thickening at the GE junction.  I recommend GI work-up and patient declined at this point. #Lung nodules, measures up to 10 mm on the right middle lobe.  Indeterminate.  Attention on follow-up.  Recommend repeat CT chest now and he declined   INTERVAL HISTORY Tommy Smith. is a 65 y.o. male who has above history reviewed by me today presents for follow up visit for multiple myeloma/evaluation prior to chemotherapy. Problems and complaints are listed below: Patient reports feeling well. He has not started taking calcium supplementation yet. Back pain has improved and resolved. He tolerates treatment well. Minimal intermittent neuropathy of fingertips.   Review of Systems  Constitutional: Negative for appetite change, chills, fatigue, fever and unexpected weight change.  HENT:   Negative for hearing loss and voice change.   Eyes: Negative for eye problems and icterus.  Respiratory: Negative for chest tightness, cough and shortness of breath.   Cardiovascular: Negative for chest pain and leg swelling.  Gastrointestinal: Negative for abdominal distention, abdominal pain and constipation.  Endocrine: Negative for hot flashes.  Genitourinary: Negative for difficulty urinating, dysuria and frequency.   Musculoskeletal: Negative for arthralgias, back pain and neck pain.  Skin: Negative for itching and rash.  Neurological: Negative for light-headedness and numbness.  Hematological: Negative for adenopathy. Does not bruise/bleed easily.   Psychiatric/Behavioral: Negative for confusion.  MEDICAL HISTORY:  Past Medical History:  Diagnosis Date  . Depression   . Hypercalcemia 03/02/2020  . Hyperlipemia   . Hypertension   . Hypogonadism in male     SURGICAL HISTORY: Past Surgical History:  Procedure Laterality Date  . COLONOSCOPY  07/14/2008    SOCIAL HISTORY: Social History   Socioeconomic History  . Marital status: Married    Spouse name: Not on file  . Number of children: Not on file  . Years of education: Not on file  . Highest education level: Not on file  Occupational History  . Not on file  Tobacco Use  . Smoking status: Current Every Day Smoker    Packs/day: 1.00    Years: 30.00    Pack years: 30.00    Types: Cigarettes  . Smokeless tobacco: Never Used  Substance and Sexual Activity  . Alcohol use: Yes    Comment: occcassional  . Drug use: No  . Sexual activity: Not on file  Other Topics Concern  . Not on file  Social History Narrative  . Not on file   Social Determinants of Health   Financial Resource Strain: Not on file  Food Insecurity: Not on file  Transportation Needs: Not on file  Physical Activity: Not on file  Stress: Not on file  Social Connections: Not on file  Intimate Partner Violence: Not on file    FAMILY HISTORY: Family History  Problem Relation Age of Onset  . Skin cancer Mother   . Prostate cancer Father     ALLERGIES:  has No Known Allergies.  MEDICATIONS:  Current Outpatient Medications  Medication Sig Dispense Refill  . acyclovir (ZOVIRAX) 400 MG tablet TAKE 1 TABLET BY MOUTH TWICE A DAY 180 tablet 1  . amLODipine (NORVASC) 10 MG tablet Take by mouth.    . ASPIRIN 81 PO Take 81 mg by mouth daily.    Marland Kitchen atorvastatin (LIPITOR) 40 MG tablet Take 40 mg by mouth daily.    Marland Kitchen buPROPion (WELLBUTRIN XL) 150 MG 24 hr tablet Take by mouth.    Marland Kitchen KLOR-CON M20 20 MEQ tablet TAKE 1 TABLET BY MOUTH EVERY DAY 30 tablet 0  . lenalidomide (REVLIMID) 25 MG capsule  Take 1 capsule (25 mg total) by mouth daily. Take 14 days on, 7 days off, repeat every 21 days. 14 capsule 0  . losartan-hydrochlorothiazide (HYZAAR) 100-12.5 MG tablet Take 1 tablet by mouth daily.    Marland Kitchen omeprazole (PRILOSEC) 20 MG capsule Take 1 capsule (20 mg total) by mouth daily. 30 capsule 1  . polyethylene glycol (MIRALAX) 17 g packet Take 17 g by mouth daily. 14 each 0  . Psyllium (METAMUCIL MULTIHEALTH FIBER PO) Take by mouth daily.    Marland Kitchen dutasteride (AVODART) 0.5 MG capsule Take by mouth.    . ondansetron (ZOFRAN) 8 MG tablet Take 1 tablet (8 mg total) by mouth 2 (two) times daily as needed (Nausea or vomiting). (Patient not taking: Reported on 05/27/2020) 30 tablet 1   No current facility-administered medications for this visit.     PHYSICAL EXAMINATION: ECOG PERFORMANCE STATUS: 1 - Symptomatic but completely ambulatory Vitals:   05/27/20 1321  BP: 111/74  Pulse: 78  Temp: 98.8 F (37.1 C)  SpO2: 98%   Filed Weights   05/27/20 1321  Weight: 241 lb 9.6 oz (109.6 kg)    Physical Exam Constitutional:      General: He is not in acute distress.    Appearance: He is not diaphoretic.  HENT:  Head: Normocephalic and atraumatic.     Nose: Nose normal.     Mouth/Throat:     Pharynx: No oropharyngeal exudate.  Eyes:     General: No scleral icterus.    Pupils: Pupils are equal, round, and reactive to light.  Cardiovascular:     Rate and Rhythm: Normal rate and regular rhythm.     Heart sounds: No murmur heard.   Pulmonary:     Effort: Pulmonary effort is normal. No respiratory distress.     Breath sounds: No rales.  Chest:     Chest wall: No tenderness.  Abdominal:     General: There is no distension.     Palpations: Abdomen is soft.     Tenderness: There is no abdominal tenderness.  Musculoskeletal:        General: Normal range of motion.     Cervical back: Normal range of motion and neck supple.  Skin:    General: Skin is warm and dry.     Findings: No  erythema.  Neurological:     Mental Status: He is alert and oriented to person, place, and time.     Cranial Nerves: No cranial nerve deficit.     Motor: No abnormal muscle tone.     Coordination: Coordination normal.  Psychiatric:        Mood and Affect: Affect normal.       LABORATORY DATA:  I have reviewed the data as listed Lab Results  Component Value Date   WBC 4.7 05/27/2020   HGB 12.4 (L) 05/27/2020   HCT 37.2 (L) 05/27/2020   MCV 88.4 05/27/2020   PLT 150 05/27/2020   Recent Labs    05/13/20 1343 05/20/20 1333 05/27/20 1257  NA 136 139 140  K 3.5 3.1* 3.7  CL 99 103 107  CO2 _0 GLUCOSE 93 99 91  BUN _1 CREATININE 0.71 0.80 0.68  CALCIUM 9.1 9.0 8.8*  GFRNONAA >60 >60 >60  PROT 6.5 6.7 6.4*  ALBUMIN 3.2* 3.2* 3.2*  AST 13* 15 14*  ALT _2 ALKPHOS 57 53 51  BILITOT 0.8 1.2 0.8   Iron/TIBC/Ferritin/ %Sat No results found for: IRON, TIBC, FERRITIN, IRONPCTSAT    RADIOGRAPHIC STUDIES: I have personally reviewed the radiological images as listed and agreed with the findings in the report. No results found.    ASSESSMENT & PLAN:  1. Multiple myeloma not having achieved remission (Tommy Smith)   2. Encounter for antineoplastic chemotherapy   3. Hypokalemia   4. Lytic bone lesions on xray   5. Normocytic anemia   Cancer Staging Multiple myeloma not having achieved remission (Tommy Smith) Staging form: Plasma Cell Myeloma and Plasma Cell Disorders, AJCC 8th Edition - Clinical stage from 02/04/2020: Beta-2-microglobulin (mg/L): 2.1, Albumin (g/dL): 2.7, ISS: Stage II, High-risk cytogenetics: Absent, LDH: Unknown - Signed by Earlie Server, MD on 05/06/2020   #IgG multiple myeloma, stage II, del 1 p, high risk On first-line treatment with RVD.  Labs are reviewed and discussed with patien  Proceed with next cycle of Revlimid 25 mg 2 weeks on a to 1 week off. Proceed with Velcade today, 06/03/2020, 06/10/2020. Continue aspirin 81 mg daily for DVT prophylaxis  and acyclovir 400 mg twice daily for shingle  prophylaxis. Multiple myeloma is pending today.  Both M protein and free light chain ratio have decreased. Plan to finish about 8 cycles and repeat bone marrow biopsy.   #Back pain due to compression fracture.  Avoid heavy lifting.  Pain has improved  #Bone lesion, Zometa every 4 to 6 weeks.  Proceed with Zometa 05/27/2020 #Hypocalcemia, mild.  Recommend patient to start calcium 1200 mg daily. 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  Hemoglobin stable. # hypokalemia, potassium stable at 3.7.  Recommend patient to continue potassium chloride 20 mEq daily.  We spent sufficient time to discuss many aspect of care, questions were answered to patient's satisfaction.  All questions were answered. The patient knows to call the clinic with any problems questions or concerns.  cc Sofie Hartigan, MD    Return of visit: Lab/Velcade on 1 week in 2 weeks, lab MD Velcade in 3 weeks.  Earlie Server, MD, PhD Hematology Oncology [facility] Pager- [phone] 05/27/2020

## 2020-05-27 NOTE — Progress Notes (Signed)
Patient here for oncology follow-up appointment, expresses  concerns of chemo effectiveness

## 2020-05-28 ENCOUNTER — Other Ambulatory Visit: Payer: Self-pay | Admitting: Oncology

## 2020-05-28 LAB — KAPPA/LAMBDA LIGHT CHAINS
Kappa free light chain: 14.2 mg/L (ref 3.3–19.4)
Kappa, lambda light chain ratio: 3.16 — ABNORMAL HIGH (ref 0.26–1.65)
Lambda free light chains: 4.5 mg/L — ABNORMAL LOW (ref 5.7–26.3)

## 2020-05-28 LAB — IGG: IgG (Immunoglobin G), Serum: 1047 mg/dL (ref 603–1613)

## 2020-05-29 LAB — PROTEIN ELECTROPHORESIS, SERUM
A/G Ratio: 1.2 (ref 0.7–1.7)
Albumin ELP: 3.3 g/dL (ref 2.9–4.4)
Alpha-1-Globulin: 0.2 g/dL (ref 0.0–0.4)
Alpha-2-Globulin: 0.7 g/dL (ref 0.4–1.0)
Beta Globulin: 1.6 g/dL — ABNORMAL HIGH (ref 0.7–1.3)
Gamma Globulin: 0.2 g/dL — ABNORMAL LOW (ref 0.4–1.8)
Globulin, Total: 2.7 g/dL (ref 2.2–3.9)
M-Spike, %: 1.3 g/dL — ABNORMAL HIGH
Total Protein ELP: 6 g/dL (ref 6.0–8.5)

## 2020-06-03 ENCOUNTER — Encounter: Payer: Self-pay | Admitting: Oncology

## 2020-06-03 ENCOUNTER — Inpatient Hospital Stay: Payer: Medicare Other

## 2020-06-03 VITALS — BP 104/70 | HR 74 | Temp 98.4°F

## 2020-06-03 DIAGNOSIS — C9000 Multiple myeloma not having achieved remission: Secondary | ICD-10-CM

## 2020-06-03 LAB — CBC WITH DIFFERENTIAL/PLATELET
Abs Immature Granulocytes: 0.02 10*3/uL (ref 0.00–0.07)
Basophils Absolute: 0 10*3/uL (ref 0.0–0.1)
Basophils Relative: 0 %
Eosinophils Absolute: 0.4 10*3/uL (ref 0.0–0.5)
Eosinophils Relative: 7 %
HCT: 41.1 % (ref 39.0–52.0)
Hemoglobin: 13.2 g/dL (ref 13.0–17.0)
Immature Granulocytes: 0 %
Lymphocytes Relative: 22 %
Lymphs Abs: 1.1 10*3/uL (ref 0.7–4.0)
MCH: 28.8 pg (ref 26.0–34.0)
MCHC: 32.1 g/dL (ref 30.0–36.0)
MCV: 89.5 fL (ref 80.0–100.0)
Monocytes Absolute: 0.5 10*3/uL (ref 0.1–1.0)
Monocytes Relative: 9 %
Neutro Abs: 3.1 10*3/uL (ref 1.7–7.7)
Neutrophils Relative %: 62 %
Platelets: 154 10*3/uL (ref 150–400)
RBC: 4.59 MIL/uL (ref 4.22–5.81)
RDW: 15.6 % — ABNORMAL HIGH (ref 11.5–15.5)
WBC: 5.1 10*3/uL (ref 4.0–10.5)
nRBC: 0 % (ref 0.0–0.2)

## 2020-06-03 LAB — COMPREHENSIVE METABOLIC PANEL
ALT: 14 U/L (ref 0–44)
AST: 12 U/L — ABNORMAL LOW (ref 15–41)
Albumin: 3.5 g/dL (ref 3.5–5.0)
Alkaline Phosphatase: 55 U/L (ref 38–126)
Anion gap: 8 (ref 5–15)
BUN: 10 mg/dL (ref 8–23)
CO2: 27 mmol/L (ref 22–32)
Calcium: 9.7 mg/dL (ref 8.9–10.3)
Chloride: 101 mmol/L (ref 98–111)
Creatinine, Ser: 0.9 mg/dL (ref 0.61–1.24)
GFR, Estimated: >60 mL/min (ref >60)
Glucose, Bld: 85 mg/dL (ref 70–99)
Potassium: 3.6 mmol/L (ref 3.5–5.1)
Sodium: 136 mmol/L (ref 135–145)
Total Bilirubin: 1 mg/dL (ref 0.3–1.2)
Total Protein: 7 g/dL (ref 6.5–8.1)

## 2020-06-03 MED ORDER — DEXAMETHASONE 4 MG PO TABS
20.0000 mg | ORAL_TABLET | Freq: Once | ORAL | Status: AC
  Administered 2020-06-03: 20 mg via ORAL
  Filled 2020-06-03: qty 5

## 2020-06-03 MED ORDER — BORTEZOMIB CHEMO SQ INJECTION 3.5 MG (2.5MG/ML)
1.3000 mg/m2 | Freq: Once | INTRAMUSCULAR | Status: AC
  Administered 2020-06-03: 3.25 mg via SUBCUTANEOUS
  Filled 2020-06-03: qty 1.3

## 2020-06-10 ENCOUNTER — Other Ambulatory Visit: Payer: Self-pay

## 2020-06-10 ENCOUNTER — Other Ambulatory Visit: Payer: Self-pay | Admitting: *Deleted

## 2020-06-10 ENCOUNTER — Inpatient Hospital Stay: Payer: Medicare Other | Attending: Oncology

## 2020-06-10 ENCOUNTER — Inpatient Hospital Stay: Payer: Medicare Other

## 2020-06-10 VITALS — BP 118/72 | HR 74 | Temp 97.1°F | Wt 240.0 lb

## 2020-06-10 DIAGNOSIS — Z79899 Other long term (current) drug therapy: Secondary | ICD-10-CM | POA: Insufficient documentation

## 2020-06-10 DIAGNOSIS — C9000 Multiple myeloma not having achieved remission: Secondary | ICD-10-CM

## 2020-06-10 DIAGNOSIS — F17210 Nicotine dependence, cigarettes, uncomplicated: Secondary | ICD-10-CM | POA: Diagnosis not present

## 2020-06-10 DIAGNOSIS — R918 Other nonspecific abnormal finding of lung field: Secondary | ICD-10-CM | POA: Insufficient documentation

## 2020-06-10 DIAGNOSIS — D649 Anemia, unspecified: Secondary | ICD-10-CM | POA: Insufficient documentation

## 2020-06-10 DIAGNOSIS — I10 Essential (primary) hypertension: Secondary | ICD-10-CM | POA: Diagnosis not present

## 2020-06-10 DIAGNOSIS — R202 Paresthesia of skin: Secondary | ICD-10-CM | POA: Insufficient documentation

## 2020-06-10 DIAGNOSIS — E785 Hyperlipidemia, unspecified: Secondary | ICD-10-CM | POA: Insufficient documentation

## 2020-06-10 DIAGNOSIS — R200 Anesthesia of skin: Secondary | ICD-10-CM | POA: Diagnosis not present

## 2020-06-10 DIAGNOSIS — Z5111 Encounter for antineoplastic chemotherapy: Secondary | ICD-10-CM | POA: Diagnosis not present

## 2020-06-10 DIAGNOSIS — M545 Low back pain, unspecified: Secondary | ICD-10-CM | POA: Insufficient documentation

## 2020-06-10 DIAGNOSIS — E876 Hypokalemia: Secondary | ICD-10-CM | POA: Diagnosis not present

## 2020-06-10 LAB — CBC WITH DIFFERENTIAL/PLATELET
Abs Immature Granulocytes: 0.01 10*3/uL (ref 0.00–0.07)
Basophils Absolute: 0 10*3/uL (ref 0.0–0.1)
Basophils Relative: 0 %
Eosinophils Absolute: 0.5 10*3/uL (ref 0.0–0.5)
Eosinophils Relative: 9 %
HCT: 38 % — ABNORMAL LOW (ref 39.0–52.0)
Hemoglobin: 12.5 g/dL — ABNORMAL LOW (ref 13.0–17.0)
Immature Granulocytes: 0 %
Lymphocytes Relative: 21 %
Lymphs Abs: 1.1 10*3/uL (ref 0.7–4.0)
MCH: 29.1 pg (ref 26.0–34.0)
MCHC: 32.9 g/dL (ref 30.0–36.0)
MCV: 88.6 fL (ref 80.0–100.0)
Monocytes Absolute: 0.8 10*3/uL (ref 0.1–1.0)
Monocytes Relative: 15 %
Neutro Abs: 2.8 10*3/uL (ref 1.7–7.7)
Neutrophils Relative %: 55 %
Platelets: 146 10*3/uL — ABNORMAL LOW (ref 150–400)
RBC: 4.29 MIL/uL (ref 4.22–5.81)
RDW: 15.9 % — ABNORMAL HIGH (ref 11.5–15.5)
WBC: 5.2 10*3/uL (ref 4.0–10.5)
nRBC: 0 % (ref 0.0–0.2)

## 2020-06-10 LAB — COMPREHENSIVE METABOLIC PANEL
ALT: 11 U/L (ref 0–44)
AST: 10 U/L — ABNORMAL LOW (ref 15–41)
Albumin: 3.4 g/dL — ABNORMAL LOW (ref 3.5–5.0)
Alkaline Phosphatase: 48 U/L (ref 38–126)
Anion gap: 7 (ref 5–15)
BUN: 10 mg/dL (ref 8–23)
CO2: 27 mmol/L (ref 22–32)
Calcium: 9.2 mg/dL (ref 8.9–10.3)
Chloride: 103 mmol/L (ref 98–111)
Creatinine, Ser: 0.83 mg/dL (ref 0.61–1.24)
GFR, Estimated: >60 mL/min (ref >60)
Glucose, Bld: 88 mg/dL (ref 70–99)
Potassium: 3.4 mmol/L — ABNORMAL LOW (ref 3.5–5.1)
Sodium: 137 mmol/L (ref 135–145)
Total Bilirubin: 1.1 mg/dL (ref 0.3–1.2)
Total Protein: 6.4 g/dL — ABNORMAL LOW (ref 6.5–8.1)

## 2020-06-10 MED ORDER — DEXAMETHASONE 4 MG PO TABS
20.0000 mg | ORAL_TABLET | Freq: Once | ORAL | Status: AC
  Administered 2020-06-10: 20 mg via ORAL

## 2020-06-10 MED ORDER — LENALIDOMIDE 25 MG PO CAPS: 25.0000 mg | ORAL_CAPSULE | Freq: Every day | ORAL | 0 refills | Status: DC

## 2020-06-10 MED ORDER — BORTEZOMIB CHEMO SQ INJECTION 3.5 MG (2.5MG/ML)
1.3000 mg/m2 | Freq: Once | INTRAMUSCULAR | Status: AC
  Administered 2020-06-10: 3.25 mg via SUBCUTANEOUS
  Filled 2020-06-10: qty 1.3

## 2020-06-10 NOTE — Telephone Encounter (Signed)
Dr. Tasia Catchings, [place] to refill Revlimid?  If so, I will obtain [employer] auth # then send refill.

## 2020-06-17 ENCOUNTER — Inpatient Hospital Stay: Payer: Medicare Other

## 2020-06-17 ENCOUNTER — Encounter: Payer: Self-pay | Admitting: Oncology

## 2020-06-17 ENCOUNTER — Inpatient Hospital Stay: Payer: Medicare Other | Admitting: Oncology

## 2020-06-17 VITALS — BP 104/75 | HR 75 | Temp 98.1°F | Wt 238.1 lb

## 2020-06-17 VITALS — Resp 18

## 2020-06-17 DIAGNOSIS — E876 Hypokalemia: Secondary | ICD-10-CM

## 2020-06-17 DIAGNOSIS — C9000 Multiple myeloma not having achieved remission: Secondary | ICD-10-CM

## 2020-06-17 DIAGNOSIS — M899 Disorder of bone, unspecified: Secondary | ICD-10-CM | POA: Diagnosis not present

## 2020-06-17 DIAGNOSIS — Z5111 Encounter for antineoplastic chemotherapy: Secondary | ICD-10-CM | POA: Diagnosis not present

## 2020-06-17 LAB — COMPREHENSIVE METABOLIC PANEL
ALT: 15 U/L (ref 0–44)
AST: 12 U/L — ABNORMAL LOW (ref 15–41)
Albumin: 3.5 g/dL (ref 3.5–5.0)
Alkaline Phosphatase: 44 U/L (ref 38–126)
Anion gap: 8 (ref 5–15)
BUN: 13 mg/dL (ref 8–23)
CO2: 26 mmol/L (ref 22–32)
Calcium: 9.1 mg/dL (ref 8.9–10.3)
Chloride: 104 mmol/L (ref 98–111)
Creatinine, Ser: 0.92 mg/dL (ref 0.61–1.24)
GFR, Estimated: >60 mL/min (ref >60)
Glucose, Bld: 85 mg/dL (ref 70–99)
Potassium: 3.6 mmol/L (ref 3.5–5.1)
Sodium: 138 mmol/L (ref 135–145)
Total Bilirubin: 1.4 mg/dL — ABNORMAL HIGH (ref 0.3–1.2)
Total Protein: 6.8 g/dL (ref 6.5–8.1)

## 2020-06-17 LAB — CBC WITH DIFFERENTIAL/PLATELET
Eosinophils Absolute: 0.2 10*3/uL (ref 0.0–0.5)
Eosinophils Relative: 5 %
HCT: 41.9 % (ref 39.0–52.0)
Hemoglobin: 13.4 g/dL (ref 13.0–17.0)
Lymphocytes Relative: 28 %
Lymphs Abs: 1.4 10*3/uL (ref 0.7–4.0)
MCH: 28.5 pg (ref 26.0–34.0)
MCHC: 32 g/dL (ref 30.0–36.0)
MCV: 89 fL (ref 80.0–100.0)
Monocytes Absolute: 0.9 10*3/uL (ref 0.1–1.0)
Monocytes Relative: 18 %
Neutro Abs: 2.4 10*3/uL (ref 1.7–7.7)
Neutrophils Relative %: 49 %
Platelets: 159 10*3/uL (ref 150–400)
RBC: 4.71 MIL/uL (ref 4.22–5.81)
RDW: 15.9 % — ABNORMAL HIGH (ref 11.5–15.5)
WBC: 4.9 10*3/uL (ref 4.0–10.5)

## 2020-06-17 MED ORDER — DEXAMETHASONE 4 MG PO TABS
20.0000 mg | ORAL_TABLET | Freq: Once | ORAL | Status: AC
  Administered 2020-06-17: 20 mg via ORAL
  Filled 2020-06-17: qty 5

## 2020-06-17 MED ORDER — BORTEZOMIB CHEMO SQ INJECTION 3.5 MG (2.5MG/ML)
1.3000 mg/m2 | Freq: Once | INTRAMUSCULAR | Status: AC
  Administered 2020-06-17: 3.25 mg via SUBCUTANEOUS
  Filled 2020-06-17: qty 1.3

## 2020-06-17 NOTE — Progress Notes (Signed)
Patient here for follow up. No new concerns voiced.  °

## 2020-06-17 NOTE — Progress Notes (Signed)
Hematology/Oncology follow up note [facility] Telephone:(336) [phone] Fax:(336) [phone]   Patient Care Team: Sofie Hartigan, MD as PCP - General (Family Medicine) Noreene Filbert, MD as Radiation Oncologist (Radiation Oncology)  REFERRING PROVIDER: Sofie Hartigan, MD  CHIEF COMPLAINTS/REASON FOR VISIT:  Follow-up for multiple myeloma HISTORY OF PRESENTING ILLNESS:   Tommy Smith. is a  65 y.o.  male with PMH listed below was seen in consultation at the request of  Sofie Hartigan, MD  for evaluation of abnormal serum protein electrophoresis.  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  Increase calcium level at 12.3, PTH was normal.  Creatinine 1, estimated GFR 91, CBC showed hemoglobin 12.4, Patient also recently had a CT chest abdomen pelvis with contrast for evaluation of right flank pain and right lower lobe nodule. 01/29/2020, CT showed multiple bilateral pulmonary nodules measuring up to 10 mm in the right middle lobe, nodules are indeterminate, infectious/inflammatory etiology versus metastatic disease. Multiple osseous lytic lesions, largest lesion involving the left pubic bone concerning for metastatic disease. Compression fractures at T9, age indeterminate. Appearance focal area of thickening at the gastroesophageal junction.  Further evaluation with upper GI study or direct visualization with endoscopy is recommended No bowel obstruction. Aortic atherosclerosis.  Patient reports NSAIDs as needed for lower back pain.  Mid lower back pain usually is worse when when he stands up from sitting position.  Patient works in the [employer].  Lives at home with wife.  Denies any unintentional weight loss, fever, chills, night sweating  # IgG multiple myeloma, stage II, del 1 p, high risk Baseline M protein 4.1, kappa light chain level 333.8 Beta-2 microglobulin 2.1, albumin 2.7. 02/12/2020, bone marrow  biopsy showed hypercellular for age, 80% plasma cell which are complex restricted by light chain in situ heparinization.  Background hematopoiesis is present but reduced. Cytogenetics is normal, Myeloma FISH panel-deletion 1P,Standard risk.  # 02/27/2020 patient has been started on Revlimid 25 mg D1-14 #02/24/2020, Velcade weekly with dexamethasone 20 mg weekly  Patient does not want to consider bone marrow transplant at this point.  #Focal area of thickening at the GE junction.  I recommend GI work-up and patient declined at this point. #Lung nodules, measures up to 10 mm on the right middle lobe.  Indeterminate.  Attention on follow-up.  Recommend repeat CT chest now and he declined   INTERVAL HISTORY Tommy Smith. is a 65 y.o. male who has above history reviewed by me today presents for follow up visit for multiple myeloma/evaluation prior to chemotherapy. Problems and complaints are listed below: Patient reports feeling well.  Minimal numbness tingling.  No new complaints.  Review of Systems  Constitutional: Negative for appetite change, chills, fatigue, fever and unexpected weight change.  HENT:   Negative for hearing loss and voice change.   Eyes: Negative for eye problems and icterus.  Respiratory: Negative for chest tightness, cough and shortness of breath.   Cardiovascular: Negative for chest pain and leg swelling.  Gastrointestinal: Negative for abdominal distention, abdominal pain and constipation.  Endocrine: Negative for hot flashes.  Genitourinary: Negative for difficulty urinating, dysuria and frequency.   Musculoskeletal: Negative for arthralgias, back pain and neck pain.  Skin: Negative for itching and rash.  Neurological: Negative for light-headedness and numbness.  Hematological: Negative for adenopathy. Does not bruise/bleed easily.  Psychiatric/Behavioral: Negative for confusion.     MEDICAL HISTORY:  Past Medical History:  Diagnosis Date   Depression  Hypercalcemia 03/02/2020   Hyperlipemia    Hypertension    Hypogonadism in male     SURGICAL HISTORY: Past Surgical History:  Procedure Laterality Date   COLONOSCOPY  07/14/2008    SOCIAL HISTORY: Social History   Socioeconomic History   Marital status: Married    Spouse name: Not on file   Number of children: Not on file   Years of education: Not on file   Highest education level: Not on file  Occupational History   Not on file  Tobacco Use   Smoking status: Current Every Day Smoker    Packs/day: 1.00    Years: 30.00    Pack years: 30.00    Types: Cigarettes   Smokeless tobacco: Never Used  Substance and Sexual Activity   Alcohol use: Yes    Comment: occcassional   Drug use: No   Sexual activity: Not on file  Other Topics Concern   Not on file  Social History Narrative   Not on file   Social Determinants of Health   Financial Resource Strain: Not on file  Food Insecurity: Not on file  Transportation Needs: Not on file  Physical Activity: Not on file  Stress: Not on file  Social Connections: Not on file  Intimate Partner Violence: Not on file    FAMILY HISTORY: Family History  Problem Relation Age of Onset   Skin cancer Mother    Prostate cancer Father     ALLERGIES:  has No Known Allergies.  MEDICATIONS:  Current Outpatient Medications  Medication Sig Dispense Refill   acyclovir (ZOVIRAX) 400 MG tablet TAKE 1 TABLET BY MOUTH TWICE A DAY 180 tablet 1   amLODipine (NORVASC) 10 MG tablet Take by mouth.     ASPIRIN 81 PO Take 81 mg by mouth daily.     atorvastatin (LIPITOR) 40 MG tablet Take 40 mg by mouth daily.     buPROPion (WELLBUTRIN XL) 150 MG 24 hr tablet Take by mouth.     KLOR-CON M20 20 MEQ tablet TAKE 1 TABLET BY MOUTH EVERY DAY 30 tablet 0   lenalidomide (REVLIMID) 25 MG capsule Take 1 capsule (25 mg total) by mouth daily. Take 14 days on, 7 days off, repeat every 21 days. 14 capsule 0    losartan-hydrochlorothiazide (HYZAAR) 100-12.5 MG tablet Take 1 tablet by mouth daily.     omeprazole (PRILOSEC) 20 MG capsule Take 1 capsule (20 mg total) by mouth daily. 30 capsule 1   polyethylene glycol (MIRALAX) 17 g packet Take 17 g by mouth daily. 14 each 0   Psyllium (METAMUCIL MULTIHEALTH FIBER PO) Take by mouth daily.     dutasteride (AVODART) 0.5 MG capsule Take 0.5 mg by mouth daily.     No current facility-administered medications for this visit.     PHYSICAL EXAMINATION: ECOG PERFORMANCE STATUS: 1 - Symptomatic but completely ambulatory Vitals:   06/17/20 1341  BP: 104/75  Pulse: 75  Temp: 98.1 F (36.7 C)  SpO2: 100%   Filed Weights   06/17/20 1341  Weight: 238 lb 2 oz (108 kg)    Physical Exam Constitutional:      General: He is not in acute distress.    Appearance: He is not diaphoretic.  HENT:     Head: Normocephalic and atraumatic.     Nose: Nose normal.     Mouth/Throat:     Pharynx: No oropharyngeal exudate.  Eyes:     General: No scleral icterus.    Pupils: Pupils are equal,  round, and reactive to light.  Cardiovascular:     Rate and Rhythm: Normal rate and regular rhythm.     Heart sounds: No murmur heard.   Pulmonary:     Effort: Pulmonary effort is normal. No respiratory distress.     Breath sounds: No rales.  Chest:     Chest wall: No tenderness.  Abdominal:     General: There is no distension.     Palpations: Abdomen is soft.     Tenderness: There is no abdominal tenderness.  Musculoskeletal:        General: Normal range of motion.     Cervical back: Normal range of motion and neck supple.  Skin:    General: Skin is warm and dry.     Findings: No erythema.  Neurological:     Mental Status: He is alert and oriented to person, place, and time.     Cranial Nerves: No cranial nerve deficit.     Motor: No abnormal muscle tone.     Coordination: Coordination normal.  Psychiatric:        Mood and Affect: Affect normal.        LABORATORY DATA:  I have reviewed the data as listed Lab Results  Component Value Date   WBC 4.9 06/17/2020   HGB 13.4 06/17/2020   HCT 41.9 06/17/2020   MCV 89.0 06/17/2020   PLT 159 06/17/2020   Recent Labs    06/03/20 1306 06/10/20 1322 06/17/20 1308  NA 136 137 138  K 3.6 3.4* 3.6  CL 101 103 104  CO2 _0 GLUCOSE 85 88 85  BUN _1 CREATININE 0.90 0.83 0.92  CALCIUM 9.7 9.2 9.1  GFRNONAA >60 >60 >60  PROT 7.0 6.4* 6.8  ALBUMIN 3.5 3.4* 3.5  AST 12* 10* 12*  ALT _2 ALKPHOS 55 48 44  BILITOT 1.0 1.1 1.4*   Iron/TIBC/Ferritin/ %Sat No results found for: IRON, TIBC, FERRITIN, IRONPCTSAT    RADIOGRAPHIC STUDIES: I have personally reviewed the radiological images as listed and agreed with the findings in the report. No results found.    ASSESSMENT & PLAN:  No diagnosis found.Cancer Staging Multiple myeloma not having achieved remission ([place]) Staging form: Plasma Cell Myeloma and Plasma Cell Disorders, AJCC 8th Edition - Clinical stage from 02/04/2020: Beta-2-microglobulin (mg/L): 2.1, Albumin (g/dL): 2.7, ISS: Stage II, High-risk cytogenetics: Absent, LDH: Unknown - Signed by Earlie Server, MD on 05/06/2020   #IgG multiple myeloma, stage II, del 1 p, high risk On first-line treatment with RVD.  Labs reviewed and are discussed with patient. Proceed with current cycle of Revlimid 25 mg 2 weeks on  1 week off. Proceed with Velcade today, 06/24/2020, 07/01/2020. Continue aspirin 81 mg daily for DVT prophylaxis.  Acyclovir 400 mg twice daily for shingle prophylaxis.  Both M protein and free light chain ratio have decreased. Plan to finish about 8 cycles and repeat bone marrow biopsy.   #Back pain due to compression fracture.  Avoid heavy lifting.  Pain has improved  #Bone lesion, Zometa every 4 to 6 weeks.  We will schedule Zometa treatment at the next visit. #Hypocalcemia, mild.  Continue calcium 1200 mg daily. #Normocytic anemia, likely  secondary to myeloma as well as chemotherapy.  Hemoglobin stable. # hypokalemia, potassium stable at 3.7.  Continue potassium chloride 20 mEq daily.  We spent sufficient time to discuss many aspect of care, questions were answered to patient's satisfaction.  All questions were answered.  The patient knows to call the clinic with any problems questions or concerns.  cc Sofie Hartigan, MD    Return of visit: Lab/Velcade on 1 week in 2 weeks, lab MD Velcade in 3 weeks.  Earlie Server, MD, PhD Hematology Oncology [facility] at [facility] Pager- [phone] 06/17/2020

## 2020-06-18 ENCOUNTER — Other Ambulatory Visit: Payer: Self-pay | Admitting: Oncology

## 2020-06-18 LAB — KAPPA/LAMBDA LIGHT CHAINS
Kappa free light chain: 12.5 mg/L (ref 3.3–19.4)
Kappa, lambda light chain ratio: 2.91 — ABNORMAL HIGH (ref 0.26–1.65)
Lambda free light chains: 4.3 mg/L — ABNORMAL LOW (ref 5.7–26.3)

## 2020-06-18 LAB — IGG: IgG (Immunoglobin G), Serum: 921 mg/dL (ref 603–1613)

## 2020-06-19 LAB — PROTEIN ELECTROPHORESIS, SERUM
A/G Ratio: 1.2 (ref 0.7–1.7)
Albumin ELP: 3.5 g/dL (ref 2.9–4.4)
Alpha-1-Globulin: 0.2 g/dL (ref 0.0–0.4)
Alpha-2-Globulin: 0.8 g/dL (ref 0.4–1.0)
Beta Globulin: 1.5 g/dL — ABNORMAL HIGH (ref 0.7–1.3)
Gamma Globulin: 0.3 g/dL — ABNORMAL LOW (ref 0.4–1.8)
Globulin, Total: 2.9 g/dL (ref 2.2–3.9)
M-Spike, %: 1.2 g/dL — ABNORMAL HIGH
Total Protein ELP: 6.4 g/dL (ref 6.0–8.5)

## 2020-06-24 ENCOUNTER — Inpatient Hospital Stay: Payer: Medicare Other

## 2020-06-24 VITALS — BP 118/66 | HR 70 | Temp 96.3°F | Resp 20 | Wt 237.2 lb

## 2020-06-24 DIAGNOSIS — C9000 Multiple myeloma not having achieved remission: Secondary | ICD-10-CM

## 2020-06-24 LAB — COMPREHENSIVE METABOLIC PANEL
ALT: 14 U/L (ref 0–44)
AST: 12 U/L — ABNORMAL LOW (ref 15–41)
Albumin: 3.4 g/dL — ABNORMAL LOW (ref 3.5–5.0)
Alkaline Phosphatase: 46 U/L (ref 38–126)
Anion gap: 10 (ref 5–15)
BUN: 9 mg/dL (ref 8–23)
CO2: 26 mmol/L (ref 22–32)
Calcium: 9.2 mg/dL (ref 8.9–10.3)
Chloride: 105 mmol/L (ref 98–111)
Creatinine, Ser: 0.94 mg/dL (ref 0.61–1.24)
GFR, Estimated: >60 mL/min (ref >60)
Glucose, Bld: 83 mg/dL (ref 70–99)
Potassium: 3.4 mmol/L — ABNORMAL LOW (ref 3.5–5.1)
Sodium: 141 mmol/L (ref 135–145)
Total Bilirubin: 1.2 mg/dL (ref 0.3–1.2)
Total Protein: 6.2 g/dL — ABNORMAL LOW (ref 6.5–8.1)

## 2020-06-24 LAB — CBC WITH DIFFERENTIAL/PLATELET
Abs Immature Granulocytes: 0.01 10*3/uL (ref 0.00–0.07)
Basophils Absolute: 0 10*3/uL (ref 0.0–0.1)
Basophils Relative: 1 %
Eosinophils Absolute: 0.4 10*3/uL (ref 0.0–0.5)
Eosinophils Relative: 10 %
HCT: 38.9 % — ABNORMAL LOW (ref 39.0–52.0)
Hemoglobin: 12.8 g/dL — ABNORMAL LOW (ref 13.0–17.0)
Immature Granulocytes: 0 %
Lymphocytes Relative: 19 %
Lymphs Abs: 0.8 10*3/uL (ref 0.7–4.0)
MCH: 28.7 pg (ref 26.0–34.0)
MCHC: 32.9 g/dL (ref 30.0–36.0)
MCV: 87.2 fL (ref 80.0–100.0)
Monocytes Absolute: 0.5 10*3/uL (ref 0.1–1.0)
Monocytes Relative: 11 %
Neutro Abs: 2.5 10*3/uL (ref 1.7–7.7)
Neutrophils Relative %: 59 %
Platelets: 115 10*3/uL — ABNORMAL LOW (ref 150–400)
RBC: 4.46 MIL/uL (ref 4.22–5.81)
RDW: 16.1 % — ABNORMAL HIGH (ref 11.5–15.5)
WBC: 4.3 10*3/uL (ref 4.0–10.5)
nRBC: 0 % (ref 0.0–0.2)

## 2020-06-24 MED ORDER — BORTEZOMIB CHEMO SQ INJECTION 3.5 MG (2.5MG/ML)
1.3000 mg/m2 | Freq: Once | INTRAMUSCULAR | Status: AC
  Administered 2020-06-24: 3.25 mg via SUBCUTANEOUS
  Filled 2020-06-24: qty 1.3

## 2020-06-24 MED ORDER — DEXAMETHASONE 4 MG PO TABS
20.0000 mg | ORAL_TABLET | Freq: Once | ORAL | Status: AC
  Administered 2020-06-24: 20 mg via ORAL
  Filled 2020-06-24: qty 5

## 2020-07-01 ENCOUNTER — Inpatient Hospital Stay: Payer: Medicare Other

## 2020-07-01 ENCOUNTER — Other Ambulatory Visit: Payer: Self-pay

## 2020-07-01 VITALS — BP 115/71 | HR 68 | Temp 97.9°F | Resp 18

## 2020-07-01 DIAGNOSIS — C9000 Multiple myeloma not having achieved remission: Secondary | ICD-10-CM | POA: Diagnosis not present

## 2020-07-01 LAB — COMPREHENSIVE METABOLIC PANEL
ALT: 16 U/L (ref 0–44)
AST: 12 U/L — ABNORMAL LOW (ref 15–41)
Albumin: 3.4 g/dL — ABNORMAL LOW (ref 3.5–5.0)
Alkaline Phosphatase: 41 U/L (ref 38–126)
Anion gap: 5 (ref 5–15)
BUN: 13 mg/dL (ref 8–23)
CO2: 30 mmol/L (ref 22–32)
Calcium: 8.9 mg/dL (ref 8.9–10.3)
Chloride: 103 mmol/L (ref 98–111)
Creatinine, Ser: 0.91 mg/dL (ref 0.61–1.24)
GFR, Estimated: >60 mL/min (ref >60)
Glucose, Bld: 88 mg/dL (ref 70–99)
Potassium: 3.5 mmol/L (ref 3.5–5.1)
Sodium: 138 mmol/L (ref 135–145)
Total Bilirubin: 1.2 mg/dL (ref 0.3–1.2)
Total Protein: 6 g/dL — ABNORMAL LOW (ref 6.5–8.1)

## 2020-07-01 LAB — CBC WITH DIFFERENTIAL/PLATELET
Abs Immature Granulocytes: 0.02 10*3/uL (ref 0.00–0.07)
Basophils Absolute: 0 10*3/uL (ref 0.0–0.1)
Basophils Relative: 0 %
Eosinophils Absolute: 0.7 10*3/uL — ABNORMAL HIGH (ref 0.0–0.5)
Eosinophils Relative: 13 %
HCT: 39.1 % (ref 39.0–52.0)
Hemoglobin: 12.7 g/dL — ABNORMAL LOW (ref 13.0–17.0)
Immature Granulocytes: 0 %
Lymphocytes Relative: 18 %
Lymphs Abs: 0.9 10*3/uL (ref 0.7–4.0)
MCH: 28.5 pg (ref 26.0–34.0)
MCHC: 32.5 g/dL (ref 30.0–36.0)
MCV: 87.7 fL (ref 80.0–100.0)
Monocytes Absolute: 1 10*3/uL (ref 0.1–1.0)
Monocytes Relative: 18 %
Neutro Abs: 2.6 10*3/uL (ref 1.7–7.7)
Neutrophils Relative %: 51 %
Platelets: 121 10*3/uL — ABNORMAL LOW (ref 150–400)
RBC: 4.46 MIL/uL (ref 4.22–5.81)
RDW: 16 % — ABNORMAL HIGH (ref 11.5–15.5)
WBC: 5.2 10*3/uL (ref 4.0–10.5)
nRBC: 0 % (ref 0.0–0.2)

## 2020-07-01 MED ORDER — DEXAMETHASONE 4 MG PO TABS
20.0000 mg | ORAL_TABLET | Freq: Once | ORAL | Status: AC
  Administered 2020-07-01: 20 mg via ORAL
  Filled 2020-07-01: qty 5

## 2020-07-01 MED ORDER — BORTEZOMIB CHEMO SQ INJECTION 3.5 MG (2.5MG/ML)
1.3000 mg/m2 | Freq: Once | INTRAMUSCULAR | Status: AC
  Administered 2020-07-01: 3.25 mg via SUBCUTANEOUS
  Filled 2020-07-01: qty 1.3

## 2020-07-07 ENCOUNTER — Other Ambulatory Visit: Payer: Self-pay | Admitting: *Deleted

## 2020-07-07 DIAGNOSIS — C9000 Multiple myeloma not having achieved remission: Secondary | ICD-10-CM

## 2020-07-08 ENCOUNTER — Inpatient Hospital Stay: Payer: Medicare Other

## 2020-07-08 ENCOUNTER — Encounter: Payer: Self-pay | Admitting: Oncology

## 2020-07-08 ENCOUNTER — Inpatient Hospital Stay: Payer: Medicare Other | Admitting: Oncology

## 2020-07-08 VITALS — BP 95/63 | HR 74 | Temp 97.4°F | Resp 18 | Wt 234.4 lb

## 2020-07-08 DIAGNOSIS — M899 Disorder of bone, unspecified: Secondary | ICD-10-CM | POA: Diagnosis not present

## 2020-07-08 DIAGNOSIS — C9000 Multiple myeloma not having achieved remission: Secondary | ICD-10-CM

## 2020-07-08 DIAGNOSIS — D649 Anemia, unspecified: Secondary | ICD-10-CM

## 2020-07-08 DIAGNOSIS — Z5111 Encounter for antineoplastic chemotherapy: Secondary | ICD-10-CM

## 2020-07-08 DIAGNOSIS — E876 Hypokalemia: Secondary | ICD-10-CM | POA: Diagnosis not present

## 2020-07-08 LAB — CBC WITH DIFFERENTIAL/PLATELET
Abs Immature Granulocytes: 0.01 10*3/uL (ref 0.00–0.07)
Basophils Absolute: 0 10*3/uL (ref 0.0–0.1)
Basophils Relative: 0 %
Eosinophils Absolute: 0.7 10*3/uL — ABNORMAL HIGH (ref 0.0–0.5)
Eosinophils Relative: 11 %
HCT: 40.5 % (ref 39.0–52.0)
Hemoglobin: 13.3 g/dL (ref 13.0–17.0)
Immature Granulocytes: 0 %
Lymphocytes Relative: 20 %
Lymphs Abs: 1.2 10*3/uL (ref 0.7–4.0)
MCH: 28.5 pg (ref 26.0–34.0)
MCHC: 32.8 g/dL (ref 30.0–36.0)
MCV: 86.9 fL (ref 80.0–100.0)
Monocytes Absolute: 0.9 10*3/uL (ref 0.1–1.0)
Monocytes Relative: 15 %
Neutro Abs: 3.3 10*3/uL (ref 1.7–7.7)
Neutrophils Relative %: 54 %
Platelets: 125 10*3/uL — ABNORMAL LOW (ref 150–400)
RBC: 4.66 MIL/uL (ref 4.22–5.81)
RDW: 16.2 % — ABNORMAL HIGH (ref 11.5–15.5)
WBC: 6.1 10*3/uL (ref 4.0–10.5)
nRBC: 0 % (ref 0.0–0.2)

## 2020-07-08 LAB — COMPREHENSIVE METABOLIC PANEL
ALT: 17 U/L (ref 0–44)
AST: 12 U/L — ABNORMAL LOW (ref 15–41)
Albumin: 3.6 g/dL (ref 3.5–5.0)
Alkaline Phosphatase: 48 U/L (ref 38–126)
Anion gap: 7 (ref 5–15)
BUN: 13 mg/dL (ref 8–23)
CO2: 28 mmol/L (ref 22–32)
Calcium: 9.1 mg/dL (ref 8.9–10.3)
Chloride: 104 mmol/L (ref 98–111)
Creatinine, Ser: 0.86 mg/dL (ref 0.61–1.24)
GFR, Estimated: >60 mL/min (ref >60)
Glucose, Bld: 89 mg/dL (ref 70–99)
Potassium: 3.5 mmol/L (ref 3.5–5.1)
Sodium: 139 mmol/L (ref 135–145)
Total Bilirubin: 1.3 mg/dL — ABNORMAL HIGH (ref 0.3–1.2)
Total Protein: 6.3 g/dL — ABNORMAL LOW (ref 6.5–8.1)

## 2020-07-08 MED ORDER — ZOLEDRONIC ACID 4 MG/5ML IV CONC
4.0000 mg | Freq: Once | INTRAVENOUS | Status: AC
  Administered 2020-07-08: 4 mg via INTRAVENOUS
  Filled 2020-07-08: qty 5

## 2020-07-08 MED ORDER — ZOLEDRONIC ACID 4 MG/100ML IV SOLN: 4.0000 mg | Freq: Once | INTRAVENOUS | Status: DC

## 2020-07-08 MED ORDER — DEXAMETHASONE 4 MG PO TABS
20.0000 mg | ORAL_TABLET | Freq: Once | ORAL | Status: AC
  Administered 2020-07-08: 20 mg via ORAL
  Filled 2020-07-08: qty 5

## 2020-07-08 MED ORDER — LENALIDOMIDE 25 MG PO CAPS: 25.0000 mg | ORAL_CAPSULE | Freq: Every day | ORAL | 0 refills | Status: DC

## 2020-07-08 MED ORDER — BORTEZOMIB CHEMO SQ INJECTION 3.5 MG (2.5MG/ML)
1.3000 mg/m2 | Freq: Once | INTRAMUSCULAR | Status: AC
  Administered 2020-07-08: 3.25 mg via SUBCUTANEOUS
  Filled 2020-07-08: qty 1.3

## 2020-07-08 MED ORDER — SODIUM CHLORIDE 0.9 % IV SOLN
Freq: Once | INTRAVENOUS | Status: AC
  Filled 2020-07-08: qty 250

## 2020-07-08 NOTE — Progress Notes (Signed)
Hematology/Oncology follow up note [facility] Telephone:([phone] Fax:(336) [phone]   Patient Care Team: Sofie Hartigan, MD as PCP - General (Family Medicine) Noreene Filbert, MD as Radiation Oncologist (Radiation Oncology)  REFERRING PROVIDER: Sofie Hartigan, MD  CHIEF COMPLAINTS/REASON FOR VISIT:  Follow-up for multiple myeloma HISTORY OF PRESENTING ILLNESS:   Tommy Smith. is a  65 y.o.  male with PMH listed below was seen in consultation at the request of  Sofie Hartigan, MD  for evaluation of abnormal serum protein electrophoresis.  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  Increase calcium level at 12.3, PTH was normal.  Creatinine 1, estimated GFR 91, CBC showed hemoglobin 12.4, Patient also recently had a CT chest abdomen pelvis with contrast for evaluation of right flank pain and right lower lobe nodule. 01/29/2020, CT showed multiple bilateral pulmonary nodules measuring up to 10 mm in the right middle lobe, nodules are indeterminate, infectious/inflammatory etiology versus metastatic disease. Multiple osseous lytic lesions, largest lesion involving the left pubic bone concerning for metastatic disease. Compression fractures at T9, age indeterminate. Appearance focal area of thickening at the gastroesophageal junction.  Further evaluation with upper GI study or direct visualization with endoscopy is recommended No bowel obstruction. Aortic atherosclerosis.  Patient reports NSAIDs as needed for lower back pain.  Mid lower back pain usually is worse when when he stands up from sitting position.  Patient works in the [employer].  Lives at home with wife.  Denies any unintentional weight loss, fever, chills, night sweating  # IgG multiple myeloma, stage II, del 1 p, high risk Baseline M protein 4.1, kappa light chain level 333.8 Beta-2 microglobulin 2.1, albumin 2.7. 02/12/2020, bone marrow  biopsy showed hypercellular for age, 80% plasma cell which are complex restricted by light chain in situ heparinization.  Background hematopoiesis is present but reduced. Cytogenetics is normal, Myeloma FISH panel-deletion 1P,Standard risk.  # 02/27/2020 patient has been started on Revlimid 25 mg D1-14 #02/24/2020, Velcade weekly with dexamethasone 20 mg weekly  Patient does not want to consider bone marrow transplant at this point.  #Focal area of thickening at the GE junction.  I recommend GI work-up and patient declined at this point. #Lung nodules, measures up to 10 mm on the right middle lobe.  Indeterminate.  Attention on follow-up.  Recommend repeat CT chest now and he declined   INTERVAL HISTORY Tommy Smith. is a 65 y.o. male who has above history reviewed by me today presents for follow up visit for multiple myeloma/evaluation prior to chemotherapy. Problems and complaints are listed below: Patient reports feeling well.  Moderate numbness tingling.  No new complaints.  Review of Systems  Constitutional: Negative for appetite change, chills, fatigue, fever and unexpected weight change.  HENT:   Negative for hearing loss and voice change.   Eyes: Negative for eye problems and icterus.  Respiratory: Negative for chest tightness, cough and shortness of breath.   Cardiovascular: Negative for chest pain and leg swelling.  Gastrointestinal: Negative for abdominal distention, abdominal pain and constipation.  Endocrine: Negative for hot flashes.  Genitourinary: Negative for difficulty urinating, dysuria and frequency.   Musculoskeletal: Negative for arthralgias, back pain and neck pain.  Skin: Negative for itching and rash.  Neurological: Negative for light-headedness and numbness.  Hematological: Negative for adenopathy. Does not bruise/bleed easily.  Psychiatric/Behavioral: Negative for confusion.     MEDICAL HISTORY:  Past Medical History:  Diagnosis Date  . Depression    .  Hypercalcemia 03/02/2020  . Hyperlipemia   . Hypertension   . Hypogonadism in male     SURGICAL HISTORY: Past Surgical History:  Procedure Laterality Date  . COLONOSCOPY  07/14/2008    SOCIAL HISTORY: Social History   Socioeconomic History  . Marital status: Married    Spouse name: Not on file  . Number of children: Not on file  . Years of education: Not on file  . Highest education level: Not on file  Occupational History  . Not on file  Tobacco Use  . Smoking status: Current Every Day Smoker    Packs/day: 1.00    Years: 30.00    Pack years: 30.00    Types: Cigarettes  . Smokeless tobacco: Never Used  Substance and Sexual Activity  . Alcohol use: Yes    Comment: occcassional  . Drug use: No  . Sexual activity: Not on file  Other Topics Concern  . Not on file  Social History Narrative  . Not on file   Social Determinants of Health   Financial Resource Strain: Not on file  Food Insecurity: Not on file  Transportation Needs: Not on file  Physical Activity: Not on file  Stress: Not on file  Social Connections: Not on file  Intimate Partner Violence: Not on file    FAMILY HISTORY: Family History  Problem Relation Age of Onset  . Skin cancer Mother   . Prostate cancer Father     ALLERGIES:  has No Known Allergies.  MEDICATIONS:  Current Outpatient Medications  Medication Sig Dispense Refill  . acyclovir (ZOVIRAX) 400 MG tablet TAKE 1 TABLET BY MOUTH TWICE A DAY 180 tablet 1  . amLODipine (NORVASC) 10 MG tablet Take by mouth.    . ASPIRIN 81 PO Take 81 mg by mouth daily.    Marland Kitchen atorvastatin (LIPITOR) 40 MG tablet Take 40 mg by mouth daily.    Marland Kitchen buPROPion (WELLBUTRIN XL) 150 MG 24 hr tablet Take by mouth.    . dutasteride (AVODART) 0.5 MG capsule Take 0.5 mg by mouth daily.    Marland Kitchen KLOR-CON M20 20 MEQ tablet TAKE 1 TABLET BY MOUTH EVERY DAY 30 tablet 0  . losartan-hydrochlorothiazide (HYZAAR) 100-12.5 MG tablet Take 1 tablet by mouth daily.    Marland Kitchen  omeprazole (PRILOSEC) 20 MG capsule Take 1 capsule (20 mg total) by mouth daily. 30 capsule 1  . polyethylene glycol (MIRALAX) 17 g packet Take 17 g by mouth daily. 14 each 0  . Psyllium (METAMUCIL MULTIHEALTH FIBER PO) Take by mouth daily.    Marland Kitchen lenalidomide (REVLIMID) 25 MG capsule Take 1 capsule (25 mg total) by mouth daily. Take 14 days on, 7 days off, repeat every 21 days. 14 capsule 0   No current facility-administered medications for this visit.     PHYSICAL EXAMINATION: ECOG PERFORMANCE STATUS: 1 - Symptomatic but completely ambulatory Vitals:   07/08/20 1348  BP: 95/63  Pulse: 74  Resp: 18  Temp: (!) 97.4 F (36.3 C)   Filed Weights   07/08/20 1348  Weight: 234 lb 6.4 oz (106.3 kg)    Physical Exam Constitutional:      General: He is not in acute distress.    Appearance: He is not diaphoretic.  HENT:     Head: Normocephalic and atraumatic.     Nose: Nose normal.     Mouth/Throat:     Pharynx: No oropharyngeal exudate.  Eyes:     General: No scleral icterus.    Pupils: Pupils are  equal, round, and reactive to light.  Cardiovascular:     Rate and Rhythm: Normal rate and regular rhythm.     Heart sounds: No murmur heard.   Pulmonary:     Effort: Pulmonary effort is normal. No respiratory distress.     Breath sounds: No rales.  Chest:     Chest wall: No tenderness.  Abdominal:     General: There is no distension.     Palpations: Abdomen is soft.     Tenderness: There is no abdominal tenderness.  Musculoskeletal:        General: Normal range of motion.     Cervical back: Normal range of motion and neck supple.  Skin:    General: Skin is warm and dry.     Findings: No erythema.  Neurological:     Mental Status: He is alert and oriented to person, place, and time.     Cranial Nerves: No cranial nerve deficit.     Motor: No abnormal muscle tone.     Coordination: Coordination normal.  Psychiatric:        Mood and Affect: Affect normal.        LABORATORY DATA:  I have reviewed the data as listed Lab Results  Component Value Date   WBC 6.1 07/08/2020   HGB 13.3 07/08/2020   HCT 40.5 07/08/2020   MCV 86.9 07/08/2020   PLT 125 (L) 07/08/2020   Recent Labs    06/24/20 1325 07/01/20 1323 07/08/20 1304  NA 141 138 139  K 3.4* 3.5 3.5  CL 105 103 104  CO2 _0 GLUCOSE 83 88 89  BUN _1 CREATININE 0.94 0.91 0.86  CALCIUM 9.2 8.9 9.1  GFRNONAA >60 >60 >60  PROT 6.2* 6.0* 6.3*  ALBUMIN 3.4* 3.4* 3.6  AST 12* 12* 12*  ALT _2 ALKPHOS 46 41 48  BILITOT 1.2 1.2 1.3*   Iron/TIBC/Ferritin/ %Sat No results found for: IRON, TIBC, FERRITIN, IRONPCTSAT    RADIOGRAPHIC STUDIES: I have personally reviewed the radiological images as listed and agreed with the findings in the report. No results found.    ASSESSMENT & PLAN:  1. Multiple myeloma not having achieved remission ([place])   2. Lytic bone lesions on xray   3. Encounter for antineoplastic chemotherapy   4. Hypokalemia   5. Hypercalcemia   6. Normocytic anemia   Cancer Staging Multiple myeloma not having achieved remission ([place]) Staging form: Plasma Cell Myeloma and Plasma Cell Disorders, AJCC 8th Edition - Clinical stage from 02/04/2020: Beta-2-microglobulin (mg/L): 2.1, Albumin (g/dL): 2.7, ISS: Stage II, High-risk cytogenetics: Absent, LDH: Unknown - Signed by Earlie Server, MD on 05/06/2020   #IgG multiple myeloma, stage II, del 1 p, high risk On first-line treatment with RVD.  Labs reviewed and discussed with patient Proceed with Velcade today, 07/15/2020, 07/22/2020. He has not received his cycles Revlimid.  Recommend patient to proceed with next cycle of Revlimid 25 mg 2 weeks on  1 week off.  When he receives medication. Continue aspirin 81 mg daily for DVT prophylaxis. Continue acyclovir 400 mg twice daily. Both M protein and free light chain ratio continue to improve. Plan to obtain bone marrow biopsy after 8 cycles. #Back pain due  to compression fracture.  Avoid heavy lifting.  Pain has improved  #Bone lesion, Zometa every 4 to 6 weeks.  Zometa today. #Hypocalcemia, mild.  Continue calcium 1200 mg daily. #Normocytic anemia, likely secondary to myeloma as well as  chemotherapy.  Hemoglobin stable. # hypokalemia, potassium stable at 3.5.  Continue potassium chloride 20 mEq daily.  We spent sufficient time to discuss many aspect of care, questions were answered to patient's satisfaction.  All questions were answered. The patient knows to call the clinic with any problems questions or concerns.  cc Sofie Hartigan, MD    Return of visit: Lab/Velcade on 1 week in 2 weeks, lab MD Velcade in 3 weeks.  Earlie Server, MD, PhD Hematology Oncology [facility] at [facility] Pager- [phone] 07/08/2020

## 2020-07-08 NOTE — Progress Notes (Signed)
Pt here for follow up. No new concerns voiced.   

## 2020-07-09 LAB — KAPPA/LAMBDA LIGHT CHAINS
Kappa free light chain: 11 mg/L (ref 3.3–19.4)
Kappa, lambda light chain ratio: 2.44 — ABNORMAL HIGH (ref 0.26–1.65)
Lambda free light chains: 4.5 mg/L — ABNORMAL LOW (ref 5.7–26.3)

## 2020-07-09 LAB — IGG: IgG (Immunoglobin G), Serum: 738 mg/dL (ref 603–1613)

## 2020-07-11 LAB — PROTEIN ELECTROPHORESIS, SERUM
A/G Ratio: 1.5 (ref 0.7–1.7)
Albumin ELP: 3.5 g/dL (ref 2.9–4.4)
Alpha-1-Globulin: 0.2 g/dL (ref 0.0–0.4)
Alpha-2-Globulin: 0.8 g/dL (ref 0.4–1.0)
Beta Globulin: 1.2 g/dL (ref 0.7–1.3)
Gamma Globulin: 0.2 g/dL — ABNORMAL LOW (ref 0.4–1.8)
Globulin, Total: 2.4 g/dL (ref 2.2–3.9)
M-Spike, %: 0.7 g/dL — ABNORMAL HIGH
Total Protein ELP: 5.9 g/dL — ABNORMAL LOW (ref 6.0–8.5)

## 2020-07-12 ENCOUNTER — Encounter: Payer: Self-pay | Admitting: Oncology

## 2020-07-15 ENCOUNTER — Other Ambulatory Visit: Payer: Self-pay

## 2020-07-15 ENCOUNTER — Other Ambulatory Visit: Payer: Self-pay | Admitting: Oncology

## 2020-07-15 ENCOUNTER — Inpatient Hospital Stay: Payer: Medicare Other

## 2020-07-15 ENCOUNTER — Inpatient Hospital Stay: Payer: Medicare Other | Attending: Oncology

## 2020-07-15 VITALS — BP 95/62 | HR 82 | Temp 97.9°F | Resp 16

## 2020-07-15 DIAGNOSIS — Z5111 Encounter for antineoplastic chemotherapy: Secondary | ICD-10-CM | POA: Insufficient documentation

## 2020-07-15 DIAGNOSIS — D649 Anemia, unspecified: Secondary | ICD-10-CM | POA: Insufficient documentation

## 2020-07-15 DIAGNOSIS — R202 Paresthesia of skin: Secondary | ICD-10-CM | POA: Diagnosis not present

## 2020-07-15 DIAGNOSIS — C9000 Multiple myeloma not having achieved remission: Secondary | ICD-10-CM | POA: Diagnosis present

## 2020-07-15 DIAGNOSIS — Z7982 Long term (current) use of aspirin: Secondary | ICD-10-CM | POA: Diagnosis not present

## 2020-07-15 DIAGNOSIS — M545 Low back pain, unspecified: Secondary | ICD-10-CM | POA: Insufficient documentation

## 2020-07-15 DIAGNOSIS — F17210 Nicotine dependence, cigarettes, uncomplicated: Secondary | ICD-10-CM | POA: Diagnosis not present

## 2020-07-15 DIAGNOSIS — Z79899 Other long term (current) drug therapy: Secondary | ICD-10-CM | POA: Insufficient documentation

## 2020-07-15 DIAGNOSIS — R918 Other nonspecific abnormal finding of lung field: Secondary | ICD-10-CM | POA: Insufficient documentation

## 2020-07-15 DIAGNOSIS — E876 Hypokalemia: Secondary | ICD-10-CM

## 2020-07-15 DIAGNOSIS — R200 Anesthesia of skin: Secondary | ICD-10-CM | POA: Insufficient documentation

## 2020-07-15 DIAGNOSIS — I700 Atherosclerosis of aorta: Secondary | ICD-10-CM | POA: Insufficient documentation

## 2020-07-15 DIAGNOSIS — D509 Iron deficiency anemia, unspecified: Secondary | ICD-10-CM | POA: Diagnosis not present

## 2020-07-15 LAB — CBC WITH DIFFERENTIAL/PLATELET
Abs Immature Granulocytes: 0.01 10*3/uL (ref 0.00–0.07)
Basophils Absolute: 0 10*3/uL (ref 0.0–0.1)
Basophils Relative: 1 %
Eosinophils Absolute: 0.7 10*3/uL — ABNORMAL HIGH (ref 0.0–0.5)
Eosinophils Relative: 14 %
HCT: 38.4 % — ABNORMAL LOW (ref 39.0–52.0)
Hemoglobin: 12.8 g/dL — ABNORMAL LOW (ref 13.0–17.0)
Immature Granulocytes: 0 %
Lymphocytes Relative: 18 %
Lymphs Abs: 0.9 10*3/uL (ref 0.7–4.0)
MCH: 28.8 pg (ref 26.0–34.0)
MCHC: 33.3 g/dL (ref 30.0–36.0)
MCV: 86.3 fL (ref 80.0–100.0)
Monocytes Absolute: 0.6 10*3/uL (ref 0.1–1.0)
Monocytes Relative: 11 %
Neutro Abs: 2.8 10*3/uL (ref 1.7–7.7)
Neutrophils Relative %: 56 %
Platelets: 114 10*3/uL — ABNORMAL LOW (ref 150–400)
RBC: 4.45 MIL/uL (ref 4.22–5.81)
RDW: 16.2 % — ABNORMAL HIGH (ref 11.5–15.5)
WBC: 5 10*3/uL (ref 4.0–10.5)
nRBC: 0 % (ref 0.0–0.2)

## 2020-07-15 LAB — COMPREHENSIVE METABOLIC PANEL
ALT: 14 U/L (ref 0–44)
AST: 14 U/L — ABNORMAL LOW (ref 15–41)
Albumin: 3.5 g/dL (ref 3.5–5.0)
Alkaline Phosphatase: 49 U/L (ref 38–126)
Anion gap: 8 (ref 5–15)
BUN: 14 mg/dL (ref 8–23)
CO2: 27 mmol/L (ref 22–32)
Calcium: 9.2 mg/dL (ref 8.9–10.3)
Chloride: 103 mmol/L (ref 98–111)
Creatinine, Ser: 0.91 mg/dL (ref 0.61–1.24)
GFR, Estimated: >60 mL/min (ref >60)
Glucose, Bld: 90 mg/dL (ref 70–99)
Potassium: 3.1 mmol/L — ABNORMAL LOW (ref 3.5–5.1)
Sodium: 138 mmol/L (ref 135–145)
Total Bilirubin: 1.2 mg/dL (ref 0.3–1.2)
Total Protein: 6.3 g/dL — ABNORMAL LOW (ref 6.5–8.1)

## 2020-07-15 MED ORDER — POTASSIUM CHLORIDE IN NACL 20-0.9 MEQ/L-% IV SOLN
Freq: Once | INTRAVENOUS | Status: AC
  Filled 2020-07-15: qty 1000

## 2020-07-15 MED ORDER — BORTEZOMIB CHEMO SQ INJECTION 3.5 MG (2.5MG/ML)
1.3000 mg/m2 | Freq: Once | INTRAMUSCULAR | Status: AC
Start: 2020-07-15 — End: 2020-07-15
  Administered 2020-07-15: 3.25 mg via SUBCUTANEOUS
  Filled 2020-07-15: qty 1.3

## 2020-07-15 MED ORDER — DEXAMETHASONE 4 MG PO TABS
20.0000 mg | ORAL_TABLET | Freq: Once | ORAL | Status: AC
  Administered 2020-07-15: 20 mg via ORAL
  Filled 2020-07-15: qty 5

## 2020-07-22 ENCOUNTER — Inpatient Hospital Stay: Payer: Medicare Other

## 2020-07-22 ENCOUNTER — Other Ambulatory Visit: Payer: Self-pay

## 2020-07-22 VITALS — BP 105/67 | HR 71 | Temp 98.2°F | Resp 16 | Ht 74.0 in | Wt 227.6 lb

## 2020-07-22 DIAGNOSIS — C9000 Multiple myeloma not having achieved remission: Secondary | ICD-10-CM

## 2020-07-22 LAB — CBC WITH DIFFERENTIAL/PLATELET
Abs Immature Granulocytes: 0.04 10*3/uL (ref 0.00–0.07)
Basophils Absolute: 0 10*3/uL (ref 0.0–0.1)
Basophils Relative: 0 %
Eosinophils Absolute: 0.9 10*3/uL — ABNORMAL HIGH (ref 0.0–0.5)
Eosinophils Relative: 14 %
HCT: 38.5 % — ABNORMAL LOW (ref 39.0–52.0)
Hemoglobin: 12.6 g/dL — ABNORMAL LOW (ref 13.0–17.0)
Immature Granulocytes: 1 %
Lymphocytes Relative: 15 %
Lymphs Abs: 1 10*3/uL (ref 0.7–4.0)
MCH: 28.3 pg (ref 26.0–34.0)
MCHC: 32.7 g/dL (ref 30.0–36.0)
MCV: 86.3 fL (ref 80.0–100.0)
Monocytes Absolute: 1.1 10*3/uL — ABNORMAL HIGH (ref 0.1–1.0)
Monocytes Relative: 16 %
Neutro Abs: 3.6 10*3/uL (ref 1.7–7.7)
Neutrophils Relative %: 54 %
Platelets: 130 10*3/uL — ABNORMAL LOW (ref 150–400)
RBC: 4.46 MIL/uL (ref 4.22–5.81)
RDW: 16.2 % — ABNORMAL HIGH (ref 11.5–15.5)
WBC: 6.6 10*3/uL (ref 4.0–10.5)
nRBC: 0 % (ref 0.0–0.2)

## 2020-07-22 LAB — COMPREHENSIVE METABOLIC PANEL
ALT: 21 U/L (ref 0–44)
AST: 13 U/L — ABNORMAL LOW (ref 15–41)
Albumin: 3.6 g/dL (ref 3.5–5.0)
Alkaline Phosphatase: 44 U/L (ref 38–126)
Anion gap: 8 (ref 5–15)
BUN: 21 mg/dL (ref 8–23)
CO2: 29 mmol/L (ref 22–32)
Calcium: 9.1 mg/dL (ref 8.9–10.3)
Chloride: 102 mmol/L (ref 98–111)
Creatinine, Ser: 1.12 mg/dL (ref 0.61–1.24)
GFR, Estimated: >60 mL/min (ref >60)
Glucose, Bld: 92 mg/dL (ref 70–99)
Potassium: 3.9 mmol/L (ref 3.5–5.1)
Sodium: 139 mmol/L (ref 135–145)
Total Bilirubin: 1 mg/dL (ref 0.3–1.2)
Total Protein: 6.2 g/dL — ABNORMAL LOW (ref 6.5–8.1)

## 2020-07-22 MED ORDER — DEXAMETHASONE 4 MG PO TABS
20.0000 mg | ORAL_TABLET | Freq: Once | ORAL | Status: AC
  Administered 2020-07-22: 20 mg via ORAL
  Filled 2020-07-22: qty 5

## 2020-07-22 MED ORDER — BORTEZOMIB CHEMO SQ INJECTION 3.5 MG (2.5MG/ML)
1.3000 mg/m2 | Freq: Once | INTRAMUSCULAR | Status: AC
  Administered 2020-07-22: 3.25 mg via SUBCUTANEOUS
  Filled 2020-07-22: qty 1.3

## 2020-07-23 LAB — KAPPA/LAMBDA LIGHT CHAINS
Kappa free light chain: 19 mg/L (ref 3.3–19.4)
Kappa, lambda light chain ratio: 2.02 — ABNORMAL HIGH (ref 0.26–1.65)
Lambda free light chains: 9.4 mg/L (ref 5.7–26.3)

## 2020-07-23 LAB — IGG: IgG (Immunoglobin G), Serum: 668 mg/dL (ref 603–1613)

## 2020-07-24 ENCOUNTER — Other Ambulatory Visit: Payer: Self-pay | Admitting: *Deleted

## 2020-07-24 DIAGNOSIS — C9000 Multiple myeloma not having achieved remission: Secondary | ICD-10-CM

## 2020-07-24 LAB — PROTEIN ELECTROPHORESIS, SERUM
A/G Ratio: 1.5 (ref 0.7–1.7)
Albumin ELP: 3.5 g/dL (ref 2.9–4.4)
Alpha-1-Globulin: 0.2 g/dL (ref 0.0–0.4)
Alpha-2-Globulin: 0.8 g/dL (ref 0.4–1.0)
Beta Globulin: 1.2 g/dL (ref 0.7–1.3)
Gamma Globulin: 0.2 g/dL — ABNORMAL LOW (ref 0.4–1.8)
Globulin, Total: 2.3 g/dL (ref 2.2–3.9)
M-Spike, %: 0.5 g/dL — ABNORMAL HIGH
Total Protein ELP: 5.8 g/dL — ABNORMAL LOW (ref 6.0–8.5)

## 2020-07-28 ENCOUNTER — Encounter: Payer: Self-pay | Admitting: Oncology

## 2020-07-28 MED ORDER — LENALIDOMIDE 25 MG PO CAPS: 25.0000 mg | ORAL_CAPSULE | Freq: Every day | ORAL | 0 refills | Status: DC

## 2020-07-31 ENCOUNTER — Other Ambulatory Visit: Payer: Self-pay

## 2020-07-31 ENCOUNTER — Inpatient Hospital Stay: Payer: Medicare Other

## 2020-07-31 ENCOUNTER — Inpatient Hospital Stay: Payer: Medicare Other | Admitting: Oncology

## 2020-07-31 ENCOUNTER — Encounter: Payer: Self-pay | Admitting: Oncology

## 2020-07-31 VITALS — BP 100/63 | HR 77 | Temp 98.5°F | Wt 228.6 lb

## 2020-07-31 DIAGNOSIS — M899 Disorder of bone, unspecified: Secondary | ICD-10-CM | POA: Diagnosis not present

## 2020-07-31 DIAGNOSIS — C9000 Multiple myeloma not having achieved remission: Secondary | ICD-10-CM | POA: Diagnosis not present

## 2020-07-31 DIAGNOSIS — D649 Anemia, unspecified: Secondary | ICD-10-CM

## 2020-07-31 DIAGNOSIS — E876 Hypokalemia: Secondary | ICD-10-CM

## 2020-07-31 DIAGNOSIS — Z5111 Encounter for antineoplastic chemotherapy: Secondary | ICD-10-CM

## 2020-07-31 DIAGNOSIS — S22070A Wedge compression fracture of T9-T10 vertebra, initial encounter for closed fracture: Secondary | ICD-10-CM

## 2020-07-31 LAB — CBC WITH DIFFERENTIAL/PLATELET
Abs Immature Granulocytes: 0.01 10*3/uL (ref 0.00–0.07)
Basophils Absolute: 0 10*3/uL (ref 0.0–0.1)
Basophils Relative: 0 %
Eosinophils Absolute: 0.6 10*3/uL — ABNORMAL HIGH (ref 0.0–0.5)
Eosinophils Relative: 10 %
HCT: 37.8 % — ABNORMAL LOW (ref 39.0–52.0)
Hemoglobin: 12.4 g/dL — ABNORMAL LOW (ref 13.0–17.0)
Immature Granulocytes: 0 %
Lymphocytes Relative: 20 %
Lymphs Abs: 1.1 10*3/uL (ref 0.7–4.0)
MCH: 28.4 pg (ref 26.0–34.0)
MCHC: 32.8 g/dL (ref 30.0–36.0)
MCV: 86.7 fL (ref 80.0–100.0)
Monocytes Absolute: 0.8 10*3/uL (ref 0.1–1.0)
Monocytes Relative: 14 %
Neutro Abs: 3.1 10*3/uL (ref 1.7–7.7)
Neutrophils Relative %: 56 %
Platelets: 141 10*3/uL — ABNORMAL LOW (ref 150–400)
RBC: 4.36 MIL/uL (ref 4.22–5.81)
RDW: 16.3 % — ABNORMAL HIGH (ref 11.5–15.5)
WBC: 5.6 10*3/uL (ref 4.0–10.5)
nRBC: 0 % (ref 0.0–0.2)

## 2020-07-31 LAB — COMPREHENSIVE METABOLIC PANEL
ALT: 17 U/L (ref 0–44)
AST: 15 U/L (ref 15–41)
Albumin: 3.6 g/dL (ref 3.5–5.0)
Alkaline Phosphatase: 47 U/L (ref 38–126)
Anion gap: 10 (ref 5–15)
BUN: 16 mg/dL (ref 8–23)
CO2: 25 mmol/L (ref 22–32)
Calcium: 8.8 mg/dL — ABNORMAL LOW (ref 8.9–10.3)
Chloride: 103 mmol/L (ref 98–111)
Creatinine, Ser: 1.02 mg/dL (ref 0.61–1.24)
GFR, Estimated: >60 mL/min (ref >60)
Glucose, Bld: 100 mg/dL — ABNORMAL HIGH (ref 70–99)
Potassium: 3.5 mmol/L (ref 3.5–5.1)
Sodium: 138 mmol/L (ref 135–145)
Total Bilirubin: 1.1 mg/dL (ref 0.3–1.2)
Total Protein: 6.3 g/dL — ABNORMAL LOW (ref 6.5–8.1)

## 2020-07-31 MED ORDER — BORTEZOMIB CHEMO SQ INJECTION 3.5 MG (2.5MG/ML)
1.3000 mg/m2 | Freq: Once | INTRAMUSCULAR | Status: AC
  Administered 2020-07-31: 3.25 mg via SUBCUTANEOUS
  Filled 2020-07-31: qty 1.3

## 2020-07-31 MED ORDER — DEXAMETHASONE 4 MG PO TABS
20.0000 mg | ORAL_TABLET | Freq: Once | ORAL | Status: AC
Start: 2020-07-31 — End: 2020-07-31
  Administered 2020-07-31: 20 mg via ORAL
  Filled 2020-07-31: qty 5

## 2020-07-31 NOTE — Progress Notes (Signed)
Patient here for follow up. No new concerns voiced.  °

## 2020-07-31 NOTE — Progress Notes (Signed)
Pt received velcade injection in clinic today. No complaints at d/c.

## 2020-08-01 NOTE — Progress Notes (Signed)
Hematology/Oncology follow up note [facility] Telephone:(336) [phone] Fax:(336) [phone]   Patient Care Team: Sofie Hartigan, MD as PCP - General (Family Medicine) Noreene Filbert, MD as Radiation Oncologist (Radiation Oncology)  REFERRING PROVIDER: Sofie Hartigan, MD  CHIEF COMPLAINTS/REASON FOR VISIT:  Follow-up for multiple myeloma HISTORY OF PRESENTING ILLNESS:   Tommy Smith. is a  65 y.o.  male with PMH listed below was seen in consultation at the request of  Sofie Hartigan, MD  for evaluation of abnormal serum protein electrophoresis.  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  Increase calcium level at 12.3, PTH was normal.  Creatinine 1, estimated GFR 91, CBC showed hemoglobin 12.4, Patient also recently had a CT chest abdomen pelvis with contrast for evaluation of right flank pain and right lower lobe nodule. 01/29/2020, CT showed multiple bilateral pulmonary nodules measuring up to 10 mm in the right middle lobe, nodules are indeterminate, infectious/inflammatory etiology versus metastatic disease. Multiple osseous lytic lesions, largest lesion involving the left pubic bone concerning for metastatic disease. Compression fractures at T9, age indeterminate. Appearance focal area of thickening at the gastroesophageal junction.  Further evaluation with upper GI study or direct visualization with endoscopy is recommended No bowel obstruction. Aortic atherosclerosis.  Patient reports NSAIDs as needed for lower back pain.  Mid lower back pain usually is worse when when he stands up from sitting position.  Patient works in the [employer].  Lives at home with wife.  Denies any unintentional weight loss, fever, chills, night sweating  # IgG multiple myeloma, stage II, del 1 p, high risk Baseline M protein 4.1, kappa light chain level 333.8 Beta-2 microglobulin 2.1, albumin 2.7. 02/12/2020, bone marrow  biopsy showed hypercellular for age, 80% plasma cell which are complex restricted by light chain in situ heparinization.  Background hematopoiesis is present but reduced. Cytogenetics is normal, Myeloma FISH panel-deletion 1P,Standard risk.  # 02/27/2020 patient has been started on Revlimid 25 mg D1-14 #02/24/2020, Velcade weekly with dexamethasone 20 mg weekly  Patient does not want to consider bone marrow transplant at this point.  #Focal area of thickening at the GE junction.  I recommend GI work-up and patient declined at this point. #Lung nodules, measures up to 10 mm on the right middle lobe.  Indeterminate.  Attention on follow-up.  Recommend repeat CT chest now and he declined   INTERVAL HISTORY Tommy Smith. is a 65 y.o. male who has above history reviewed by me today presents for follow up visit for multiple myeloma/evaluation prior to chemotherapy. Problems and complaints are listed below: Patient reports feeling well.  Moderate numbness tingling.  No new complaints. Weight loss, appetite is fair.  Review of Systems  Constitutional: Negative for appetite change, chills, fatigue, fever and unexpected weight change.  HENT:   Negative for hearing loss and voice change.   Eyes: Negative for eye problems and icterus.  Respiratory: Negative for chest tightness, cough and shortness of breath.   Cardiovascular: Negative for chest pain and leg swelling.  Gastrointestinal: Negative for abdominal distention, abdominal pain and constipation.  Endocrine: Negative for hot flashes.  Genitourinary: Negative for difficulty urinating, dysuria and frequency.   Musculoskeletal: Negative for arthralgias, back pain and neck pain.  Skin: Negative for itching and rash.  Neurological: Negative for light-headedness and numbness.  Hematological: Negative for adenopathy. Does not bruise/bleed easily.  Psychiatric/Behavioral: Negative for confusion.     MEDICAL HISTORY:  Past Medical History:   Diagnosis Date  .  Depression   . Hypercalcemia 03/02/2020  . Hyperlipemia   . Hypertension   . Hypogonadism in male     SURGICAL HISTORY: Past Surgical History:  Procedure Laterality Date  . COLONOSCOPY  07/14/2008    SOCIAL HISTORY: Social History   Socioeconomic History  . Marital status: Married    Spouse name: Not on file  . Number of children: Not on file  . Years of education: Not on file  . Highest education level: Not on file  Occupational History  . Not on file  Tobacco Use  . Smoking status: Current Every Day Smoker    Packs/day: 1.00    Years: 30.00    Pack years: 30.00    Types: Cigarettes  . Smokeless tobacco: Never Used  Substance and Sexual Activity  . Alcohol use: Yes    Comment: occcassional  . Drug use: No  . Sexual activity: Not on file  Other Topics Concern  . Not on file  Social History Narrative  . Not on file   Social Determinants of Health   Financial Resource Strain: Not on file  Food Insecurity: Not on file  Transportation Needs: Not on file  Physical Activity: Not on file  Stress: Not on file  Social Connections: Not on file  Intimate Partner Violence: Not on file    FAMILY HISTORY: Family History  Problem Relation Age of Onset  . Skin cancer Mother   . Prostate cancer Father     ALLERGIES:  has No Known Allergies.  MEDICATIONS:  Current Outpatient Medications  Medication Sig Dispense Refill  . acyclovir (ZOVIRAX) 400 MG tablet TAKE 1 TABLET BY MOUTH TWICE A DAY 180 tablet 1  . amLODipine (NORVASC) 10 MG tablet Take by mouth.    . ASPIRIN 81 PO Take 81 mg by mouth daily.    Marland Kitchen atorvastatin (LIPITOR) 40 MG tablet Take 40 mg by mouth daily.    Marland Kitchen buPROPion (WELLBUTRIN XL) 150 MG 24 hr tablet Take by mouth.    . dutasteride (AVODART) 0.5 MG capsule Take 0.5 mg by mouth daily.    Marland Kitchen lenalidomide (REVLIMID) 25 MG capsule Take 1 capsule (25 mg total) by mouth daily. Take 14 days on, 7 days off, repeat every 21 days. 14 capsule  0  . losartan-hydrochlorothiazide (HYZAAR) 100-12.5 MG tablet Take 1 tablet by mouth daily.    Marland Kitchen omeprazole (PRILOSEC) 20 MG capsule Take 1 capsule (20 mg total) by mouth daily. 30 capsule 1  . polyethylene glycol (MIRALAX) 17 g packet Take 17 g by mouth daily. 14 each 0  . potassium chloride SA (KLOR-CON M20) 20 MEQ tablet Take 1 tablet (20 mEq total) by mouth 2 (two) times daily. 60 tablet 0  . Psyllium (METAMUCIL MULTIHEALTH FIBER PO) Take by mouth daily.     No current facility-administered medications for this visit.     PHYSICAL EXAMINATION: ECOG PERFORMANCE STATUS: 1 - Symptomatic but completely ambulatory Vitals:   07/31/20 1323  BP: 100/63  Pulse: 77  Temp: 98.5 F (36.9 C)   Filed Weights   07/31/20 1323  Weight: 228 lb 9.6 oz (103.7 kg)    Physical Exam Constitutional:      General: He is not in acute distress.    Appearance: He is not diaphoretic.  HENT:     Head: Normocephalic and atraumatic.     Nose: Nose normal.     Mouth/Throat:     Pharynx: No oropharyngeal exudate.  Eyes:     General: No  scleral icterus.    Pupils: Pupils are equal, round, and reactive to light.  Cardiovascular:     Rate and Rhythm: Normal rate and regular rhythm.     Heart sounds: No murmur heard.   Pulmonary:     Effort: Pulmonary effort is normal. No respiratory distress.     Breath sounds: No rales.  Chest:     Chest wall: No tenderness.  Abdominal:     General: There is no distension.     Palpations: Abdomen is soft.     Tenderness: There is no abdominal tenderness.  Musculoskeletal:        General: Normal range of motion.     Cervical back: Normal range of motion and neck supple.  Skin:    General: Skin is warm and dry.     Findings: No erythema.  Neurological:     Mental Status: He is alert and oriented to person, place, and time.     Cranial Nerves: No cranial nerve deficit.     Motor: No abnormal muscle tone.     Coordination: Coordination normal.  Psychiatric:         Mood and Affect: Affect normal.       LABORATORY DATA:  I have reviewed the data as listed Lab Results  Component Value Date   WBC 5.6 07/31/2020   HGB 12.4 (L) 07/31/2020   HCT 37.8 (L) 07/31/2020   MCV 86.7 07/31/2020   PLT 141 (L) 07/31/2020   Recent Labs    07/15/20 1344 07/22/20 1402 07/31/20 1303  NA 138 139 138  K 3.1* 3.9 3.5  CL 103 102 103  CO2 27 29 25   GLUCOSE 90 92 100*  BUN 14 21 16   CREATININE 0.91 1.12 1.02  CALCIUM 9.2 9.1 8.8*  GFRNONAA >60 >60 >60  PROT 6.3* 6.2* 6.3*  ALBUMIN 3.5 3.6 3.6  AST 14* 13* 15  ALT 14 21 17   ALKPHOS 49 44 47  BILITOT 1.2 1.0 1.1   Iron/TIBC/Ferritin/ %Sat No results found for: IRON, TIBC, FERRITIN, IRONPCTSAT    RADIOGRAPHIC STUDIES: I have personally reviewed the radiological images as listed and agreed with the findings in the report. No results found.    ASSESSMENT & PLAN:  1. Multiple myeloma not having achieved remission ([place])   2. Hypercalcemia   3. Lytic bone lesions on xray   4. Encounter for antineoplastic chemotherapy   5. Normocytic anemia   6. Hypokalemia   7. Compression fracture of T9 vertebra, initial encounter [facility])   Cancer Staging Multiple myeloma not having achieved remission ([place]) Staging form: Plasma Cell Myeloma and Plasma Cell Disorders, AJCC 8th Edition - Clinical stage from 02/04/2020: Beta-2-microglobulin (mg/L): 2.1, Albumin (g/dL): 2.7, ISS: Stage II, High-risk cytogenetics: Absent, LDH: Unknown - Signed by Earlie Server, MD on 05/06/2020   #IgG multiple myeloma, stage II, del 1 p, high risk On first-line treatment with RVD.  Labs are reviewed and discussed with patient. Proceed with Velcade today, and on 08/07/2020,08/14/2020. Proceed with next cycle of Revlimid 25 mg 2 weeks on  1 week off.  Continue  aspirin 81 mg daily for DVT prophylaxis. Continue acyclovir 400 mg twice daily. Both M protein and free light chain ratio continue to improve. Obtain bone marrow biopsy for  evaluation of treatment response.  #Back pain due to compression fracture.  Avoid heavy lifting.  Pain has resolved.  #Bone lesion, Zometa every 4 to 6 weeks.  Zometa at next visit.  #Hypocalcemia, mild.  recommend  calcium 1200 mg daily. 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  Hemoglobin stable. # hypokalemia, potassium stable at 3.5.  Continue potassium chloride 20 mEq daily.  We spent sufficient time to discuss many aspect of care, questions were answered to patient's satisfaction.  All questions were answered. The patient knows to call the clinic with any problems questions or concerns.  cc Sofie Hartigan, MD    Return of visit: Lab/Velcade on 1 week in 2 weeks, lab MD Velcade in 3 weeks.  Earlie Server, MD, PhD Hematology Oncology [facility] at [facility] Pager- [phone] 08/01/2020

## 2020-08-05 ENCOUNTER — Encounter: Payer: Self-pay | Admitting: Oncology

## 2020-08-05 ENCOUNTER — Other Ambulatory Visit: Payer: Self-pay

## 2020-08-05 MED ORDER — SENNOSIDES-DOCUSATE SODIUM 8.6-50 MG PO TABS: 2.0000 | ORAL_TABLET | Freq: Every day | ORAL | 3 refills | Status: DC

## 2020-08-07 ENCOUNTER — Inpatient Hospital Stay: Payer: Medicare Other

## 2020-08-07 ENCOUNTER — Other Ambulatory Visit: Payer: Self-pay

## 2020-08-07 VITALS — BP 105/68 | HR 70 | Temp 96.8°F | Resp 18 | Wt 227.6 lb

## 2020-08-07 DIAGNOSIS — C9000 Multiple myeloma not having achieved remission: Secondary | ICD-10-CM

## 2020-08-07 LAB — COMPREHENSIVE METABOLIC PANEL
ALT: 20 U/L (ref 0–44)
AST: 18 U/L (ref 15–41)
Albumin: 3.8 g/dL (ref 3.5–5.0)
Alkaline Phosphatase: 40 U/L (ref 38–126)
Anion gap: 8 (ref 5–15)
BUN: 19 mg/dL (ref 8–23)
CO2: 29 mmol/L (ref 22–32)
Calcium: 9.2 mg/dL (ref 8.9–10.3)
Chloride: 103 mmol/L (ref 98–111)
Creatinine, Ser: 0.88 mg/dL (ref 0.61–1.24)
GFR, Estimated: >60 mL/min (ref >60)
Glucose, Bld: 77 mg/dL (ref 70–99)
Potassium: 3.4 mmol/L — ABNORMAL LOW (ref 3.5–5.1)
Sodium: 140 mmol/L (ref 135–145)
Total Bilirubin: 1.1 mg/dL (ref 0.3–1.2)
Total Protein: 6.2 g/dL — ABNORMAL LOW (ref 6.5–8.1)

## 2020-08-07 LAB — CBC WITH DIFFERENTIAL/PLATELET
Abs Immature Granulocytes: 0.03 10*3/uL (ref 0.00–0.07)
Basophils Absolute: 0 10*3/uL (ref 0.0–0.1)
Basophils Relative: 0 %
Eosinophils Absolute: 0.7 10*3/uL — ABNORMAL HIGH (ref 0.0–0.5)
Eosinophils Relative: 14 %
HCT: 37.9 % — ABNORMAL LOW (ref 39.0–52.0)
Hemoglobin: 12.4 g/dL — ABNORMAL LOW (ref 13.0–17.0)
Immature Granulocytes: 1 %
Lymphocytes Relative: 18 %
Lymphs Abs: 1 10*3/uL (ref 0.7–4.0)
MCH: 28.4 pg (ref 26.0–34.0)
MCHC: 32.7 g/dL (ref 30.0–36.0)
MCV: 86.7 fL (ref 80.0–100.0)
Monocytes Absolute: 0.6 10*3/uL (ref 0.1–1.0)
Monocytes Relative: 12 %
Neutro Abs: 2.9 10*3/uL (ref 1.7–7.7)
Neutrophils Relative %: 55 %
Platelets: 126 10*3/uL — ABNORMAL LOW (ref 150–400)
RBC: 4.37 MIL/uL (ref 4.22–5.81)
RDW: 17 % — ABNORMAL HIGH (ref 11.5–15.5)
WBC: 5.3 10*3/uL (ref 4.0–10.5)
nRBC: 0 % (ref 0.0–0.2)

## 2020-08-07 MED ORDER — SODIUM CHLORIDE 0.9 % IV SOLN
Freq: Once | INTRAVENOUS | Status: AC
  Filled 2020-08-07: qty 250

## 2020-08-07 MED ORDER — DEXAMETHASONE 4 MG PO TABS
20.0000 mg | ORAL_TABLET | Freq: Once | ORAL | Status: AC
Start: 2020-08-07 — End: 2020-08-07
  Administered 2020-08-07: 20 mg via ORAL
  Filled 2020-08-07: qty 5

## 2020-08-07 MED ORDER — BORTEZOMIB CHEMO SQ INJECTION 3.5 MG (2.5MG/ML)
1.3000 mg/m2 | Freq: Once | INTRAMUSCULAR | Status: AC
  Administered 2020-08-07: 3.25 mg via SUBCUTANEOUS
  Filled 2020-08-07: qty 1.3

## 2020-08-07 MED ORDER — ZOLEDRONIC ACID 4 MG/100ML IV SOLN
4.0000 mg | Freq: Once | INTRAVENOUS | Status: AC
  Administered 2020-08-07: 4 mg via INTRAVENOUS
  Filled 2020-08-07: qty 100

## 2020-08-07 NOTE — Progress Notes (Signed)
Potassium: 3.4. MD, Dr. Tasia Catchings, notified and aware. Per MD secure chat message: instruct patient to continue taking PO Potassium at home; no new orders today. Patient informed and verbalized understanding.

## 2020-08-07 NOTE — Patient Instructions (Signed)
[facility]    Discharge Instructions:  Thank you for choosing [facility] to provide your oncology and hematology care.  If you have a lab appointment with the [facility], please go directly to the [facility] and check in at the registration area.  Wear comfortable clothing and clothing appropriate for easy access to any Portacath or PICC line.   We strive to give you quality time with your provider. You may need to reschedule your appointment if you arrive late (15 or more minutes).  Arriving late affects you and other patients whose appointments are after yours.  Also, if you miss three or more appointments without notifying the office, you may be dismissed from the clinic at the provider's discretion.      For prescription refill requests, have your pharmacy contact our office and allow 72 hours for refills to be completed.    Today you received the following chemotherapy and/or immunotherapy agents: Bortezomib (Velcade). Today you also received the following treatment: Zoledronic Acid (Zometa).      To help prevent nausea and vomiting after your treatment, we encourage you to take your nausea medication as directed.  BELOW ARE SYMPTOMS THAT SHOULD BE REPORTED IMMEDIATELY: . *FEVER GREATER THAN 100.4 F (38 C) OR HIGHER . *CHILLS OR SWEATING . *NAUSEA AND VOMITING THAT IS NOT CONTROLLED WITH YOUR NAUSEA MEDICATION . *UNUSUAL SHORTNESS OF BREATH . *UNUSUAL BRUISING OR BLEEDING . *URINARY PROBLEMS (pain or burning when urinating, or frequent urination) . *BOWEL PROBLEMS (unusual diarrhea, constipation, pain near the anus) . TENDERNESS IN MOUTH AND THROAT WITH OR WITHOUT PRESENCE OF ULCERS (sore throat, sores in mouth, or a toothache) . UNUSUAL RASH, SWELLING OR PAIN  . UNUSUAL VAGINAL DISCHARGE OR ITCHING   Items with * indicate a potential emergency and should be followed up as soon as possible or go to the Emergency Department  if any problems should occur.  Please show the CHEMOTHERAPY ALERT CARD or IMMUNOTHERAPY ALERT CARD at check-in to the Emergency Department and triage nurse.  Should you have questions after your visit or need to cancel or reschedule your appointment, please contact [facility]  [phone] and follow the prompts.  Office hours are 8:00 a.m. to 4:30 p.m. Monday - Friday. Please note that voicemails left after 4:00 p.m. may not be returned until the following business day.  We are closed weekends and major holidays. You have access to a nurse at all times for urgent questions. Please call the main number to the clinic [phone] and follow the prompts.  For any non-urgent questions, you may also contact your provider using MyChart. We now offer e-Visits for anyone 42 and older to request care online for non-urgent symptoms. For details visit [email or web address].   Also download the MyChart app! Go to the app store, search "MyChart", open the app, select [facility], and log in with your MyChart username and password.  Due to Covid, a mask is required upon entering the hospital/clinic. If you do not have a mask, one will be given to you upon arrival. For doctor visits, patients may have 1 support person aged 59 or older with them. For treatment visits, patients cannot have anyone with them due to current Covid guidelines and our immunocompromised population.   Bortezomib injection What is this medicine? BORTEZOMIB (bor TEZ oh mib) targets proteins in cancer cells and stops the cancer cells from growing. It treats multiple myeloma and mantle cell lymphoma.  This medicine may be used for other purposes; ask your health care provider or pharmacist if you have questions. COMMON BRAND NAME(S): Velcade What should I tell my health care provider before I take this medicine? They need to know if you have any of these conditions:  dehydration  diabetes (high blood  sugar)  heart disease  liver disease  tingling of the fingers or toes or other nerve disorder  an unusual or allergic reaction to bortezomib, mannitol, boron, other medicines, foods, dyes, or preservatives  pregnant or trying to get pregnant  breast-feeding How should I use this medicine? This medicine is injected into a vein or under the skin. It is given by a health care provider in a hospital or clinic setting. Talk to your health care provider about the use of this medicine in children. Special care may be needed. Overdosage: If you think you have taken too much of this medicine contact a poison control center or emergency room at once. NOTE: This medicine is only for you. Do not share this medicine with others. What if I miss a dose? Keep appointments for follow-up doses. It is important not to miss your dose. Call your health care provider if you are unable to keep an appointment. What may interact with this medicine? This medicine may interact with the following medications:  ketoconazole  rifampin This list may not describe all possible interactions. Give your health care provider a list of all the medicines, herbs, non-prescription drugs, or dietary supplements you use. Also tell them if you smoke, drink alcohol, or use illegal drugs. Some items may interact with your medicine. What should I watch for while using this medicine? Your condition will be monitored carefully while you are receiving this medicine. You may need blood work done while you are taking this medicine. You may get drowsy or dizzy. Do not drive, use machinery, or do anything that needs mental alertness until you know how this medicine affects you. Do not stand up or sit up quickly, especially if you are an older patient. This reduces the risk of dizzy or fainting spells This medicine may increase your risk of getting an infection. Call your health care provider for advice if you get a fever, chills, sore  throat, or other symptoms of a cold or flu. Do not treat yourself. Try to avoid being around people who are sick. Check with your health care provider if you have severe diarrhea, nausea, and vomiting, or if you sweat a lot. The loss of too much body fluid may make it dangerous for you to take this medicine. Do not become pregnant while taking this medicine or for 7 months after stopping it. Women should inform their health care provider if they wish to become pregnant or think they might be pregnant. Men should not father a child while taking this medicine and for 4 months after stopping it. There is a potential for serious harm to an unborn child. Talk to your health care provider for more information. Do not breast-feed an infant while taking this medicine or for 2 months after stopping it. This medicine may make it more difficult to get pregnant or father a child. Talk to your health care provider if you are concerned about your fertility. What side effects may I notice from receiving this medicine? Side effects that you should report to your doctor or health care professional as soon as possible:  allergic reactions (skin rash; itching or hives; swelling of the face, lips,  or tongue)  bleeding (bloody or black, tarry stools; red or dark brown urine; spitting up blood or brown material that looks like coffee grounds; red spots on the skin; unusual bruising or bleeding from the eye, gums, or nose)  blurred vision or changes in vision  confusion  constipation  headache  heart failure (trouble breathing; fast, irregular heartbeat; sudden weight gain; swelling of the ankles, feet, hands)  infection (fever, chills, cough, sore throat, pain or trouble passing urine)  lack or loss of appetite  liver injury (dark yellow or brown urine; general ill feeling or flu-like symptoms; loss of appetite, right upper belly pain; yellowing of the eyes or skin)  low blood pressure (dizziness; feeling faint  or lightheaded, falls; unusually weak or tired)  muscle cramps  pain, redness, or irritation at site where injected  pain, tingling, numbness in the hands or feet  seizures  trouble breathing  unusual bruising or bleeding Side effects that usually do not require medical attention (report to your doctor or health care professional if they continue or are bothersome):  diarrhea  nausea  stomach pain  trouble sleeping  vomiting This list may not describe all possible side effects. Call your doctor for medical advice about side effects. You may report side effects to FDA at 1-800-FDA-1088. Where should I keep my medicine? This medicine is given in a hospital or clinic. It will not be stored at home. NOTE: This sheet is a summary. It may not cover all possible information. If you have questions about this medicine, talk to your doctor, pharmacist, or health care provider.  2021 Elsevier/Gold Standard (2020-03-19 13:22:53)  Zoledronic Acid Injection (Hypercalcemia, Oncology) What is this medicine? ZOLEDRONIC ACID (ZOE le dron ik AS id) slows calcium loss from bones. It high calcium levels in the blood from some kinds of cancer. It may be used in other people at risk for bone loss. This medicine may be used for other purposes; ask your health care provider or pharmacist if you have questions. COMMON BRAND NAME(S): Zometa What should I tell my health care provider before I take this medicine? They need to know if you have any of these conditions:  cancer  dehydration  dental disease  kidney disease  liver disease  low levels of calcium in the blood  lung or breathing disease (asthma)  receiving steroids like dexamethasone or prednisone  an unusual or allergic reaction to zoledronic acid, other medicines, foods, dyes, or preservatives  pregnant or trying to get pregnant  breast-feeding How should I use this medicine? This drug is injected into a vein. It is given  by a health care provider in a hospital or clinic setting. Talk to your health care provider about the use of this drug in children. Special care may be needed. Overdosage: If you think you have taken too much of this medicine contact a poison control center or emergency room at once. NOTE: This medicine is only for you. Do not share this medicine with others. What if I miss a dose? Keep appointments for follow-up doses. It is important not to miss your dose. Call your health care provider if you are unable to keep an appointment. What may interact with this medicine?  certain antibiotics given by injection  NSAIDs, medicines for pain and inflammation, like ibuprofen or naproxen  some diuretics like bumetanide, furosemide  teriparatide  thalidomide This list may not describe all possible interactions. Give your health care provider a list of all the medicines, herbs,  non-prescription drugs, or dietary supplements you use. Also tell them if you smoke, drink alcohol, or use illegal drugs. Some items may interact with your medicine. What should I watch for while using this medicine? Visit your health care provider for regular checks on your progress. It may be some time before you see the benefit from this drug. Some people who take this drug have severe bone, joint, or muscle pain. This drug may also increase your risk for jaw problems or a broken thigh bone. Tell your health care provider right away if you have severe pain in your jaw, bones, joints, or muscles. Tell you health care provider if you have any pain that does not go away or that gets worse. Tell your dentist and dental surgeon that you are taking this drug. You should not have major dental surgery while on this drug. See your dentist to have a dental exam and fix any dental problems before starting this drug. Take good care of your teeth while on this drug. Make sure you see your dentist for regular follow-up appointments. You  should make sure you get enough calcium and vitamin D while you are taking this drug. Discuss the foods you eat and the vitamins you take with your health care provider. Check with your health care provider if you have severe diarrhea, nausea, and vomiting, or if you sweat a lot. The loss of too much body fluid may make it dangerous for you to take this drug. You may need blood work done while you are taking this drug. Do not become pregnant while taking this drug. Women should inform their health care provider if they wish to become pregnant or think they might be pregnant. There is potential for serious harm to an unborn child. Talk to your health care provider for more information. What side effects may I notice from receiving this medicine? Side effects that you should report to your doctor or health care provider as soon as possible:  allergic reactions (skin rash, itching or hives; swelling of the face, lips, or tongue)  bone pain  infection (fever, chills, cough, sore throat, pain or trouble passing urine)  jaw pain, especially after dental work  joint pain  kidney injury (trouble passing urine or change in the amount of urine)  low blood pressure (dizziness; feeling faint or lightheaded, falls; unusually weak or tired)  low calcium levels (fast heartbeat; muscle cramps or pain; pain, tingling, or numbness in the hands or feet; seizures)  low magnesium levels (fast, irregular heartbeat; muscle cramp or pain; muscle weakness; tremors; seizures)  low red blood cell counts (trouble breathing; feeling faint; lightheaded, falls; unusually weak or tired)  muscle pain  redness, blistering, peeling, or loosening of the skin, including inside the mouth  severe diarrhea  swelling of the ankles, feet, hands  trouble breathing Side effects that usually do not require medical attention (report to your doctor or health care provider if they continue or are  bothersome):  anxious  constipation  coughing  depressed mood  eye irritation, itching, or pain  fever  general ill feeling or flu-like symptoms  nausea  pain, redness, or irritation at site where injected  trouble sleeping This list may not describe all possible side effects. Call your doctor for medical advice about side effects. You may report side effects to FDA at 1-800-FDA-1088. Where should I keep my medicine? This drug is given in a hospital or clinic. It will not be stored at home. NOTE: This  sheet is a summary. It may not cover all possible information. If you have questions about this medicine, talk to your doctor, pharmacist, or health care provider.  2021 Elsevier/Gold Standard (2019-01-10 09:13:00)

## 2020-08-08 ENCOUNTER — Other Ambulatory Visit: Payer: Self-pay | Admitting: Oncology

## 2020-08-08 ENCOUNTER — Encounter: Payer: Self-pay | Admitting: Oncology

## 2020-08-14 ENCOUNTER — Other Ambulatory Visit: Payer: Self-pay

## 2020-08-14 ENCOUNTER — Inpatient Hospital Stay: Payer: Medicare Other

## 2020-08-14 ENCOUNTER — Other Ambulatory Visit: Payer: Self-pay | Admitting: *Deleted

## 2020-08-14 ENCOUNTER — Inpatient Hospital Stay: Payer: Medicare Other | Attending: Oncology

## 2020-08-14 VITALS — BP 108/68 | HR 70 | Temp 98.9°F | Resp 18 | Wt 226.8 lb

## 2020-08-14 DIAGNOSIS — E876 Hypokalemia: Secondary | ICD-10-CM

## 2020-08-14 DIAGNOSIS — C9000 Multiple myeloma not having achieved remission: Secondary | ICD-10-CM | POA: Diagnosis present

## 2020-08-14 DIAGNOSIS — Z5111 Encounter for antineoplastic chemotherapy: Secondary | ICD-10-CM | POA: Diagnosis not present

## 2020-08-14 LAB — CBC WITH DIFFERENTIAL/PLATELET
Abs Immature Granulocytes: 0.06 10*3/uL (ref 0.00–0.07)
Basophils Absolute: 0 10*3/uL (ref 0.0–0.1)
Basophils Relative: 0 %
Eosinophils Absolute: 1.1 10*3/uL — ABNORMAL HIGH (ref 0.0–0.5)
Eosinophils Relative: 18 %
HCT: 35.7 % — ABNORMAL LOW (ref 39.0–52.0)
Hemoglobin: 11.7 g/dL — ABNORMAL LOW (ref 13.0–17.0)
Immature Granulocytes: 1 %
Lymphocytes Relative: 13 %
Lymphs Abs: 0.8 10*3/uL (ref 0.7–4.0)
MCH: 28.3 pg (ref 26.0–34.0)
MCHC: 32.8 g/dL (ref 30.0–36.0)
MCV: 86.4 fL (ref 80.0–100.0)
Monocytes Absolute: 1.1 10*3/uL — ABNORMAL HIGH (ref 0.1–1.0)
Monocytes Relative: 17 %
Neutro Abs: 3.4 10*3/uL (ref 1.7–7.7)
Neutrophils Relative %: 51 %
Platelets: 149 10*3/uL — ABNORMAL LOW (ref 150–400)
RBC: 4.13 MIL/uL — ABNORMAL LOW (ref 4.22–5.81)
RDW: 17 % — ABNORMAL HIGH (ref 11.5–15.5)
WBC: 6.5 10*3/uL (ref 4.0–10.5)
nRBC: 0 % (ref 0.0–0.2)

## 2020-08-14 LAB — COMPREHENSIVE METABOLIC PANEL
ALT: 17 U/L (ref 0–44)
AST: 13 U/L — ABNORMAL LOW (ref 15–41)
Albumin: 3.5 g/dL (ref 3.5–5.0)
Alkaline Phosphatase: 40 U/L (ref 38–126)
Anion gap: 10 (ref 5–15)
BUN: 25 mg/dL — ABNORMAL HIGH (ref 8–23)
CO2: 28 mmol/L (ref 22–32)
Calcium: 9.3 mg/dL (ref 8.9–10.3)
Chloride: 100 mmol/L (ref 98–111)
Creatinine, Ser: 1.19 mg/dL (ref 0.61–1.24)
GFR, Estimated: >60 mL/min (ref >60)
Glucose, Bld: 84 mg/dL (ref 70–99)
Potassium: 3.2 mmol/L — ABNORMAL LOW (ref 3.5–5.1)
Sodium: 138 mmol/L (ref 135–145)
Total Bilirubin: 1.6 mg/dL — ABNORMAL HIGH (ref 0.3–1.2)
Total Protein: 6.2 g/dL — ABNORMAL LOW (ref 6.5–8.1)

## 2020-08-14 MED ORDER — SODIUM CHLORIDE 0.9 % IV SOLN
Freq: Once | INTRAVENOUS | Status: AC
  Filled 2020-08-14: qty 250

## 2020-08-14 MED ORDER — BORTEZOMIB CHEMO SQ INJECTION 3.5 MG (2.5MG/ML)
1.3000 mg/m2 | Freq: Once | INTRAMUSCULAR | Status: AC
  Administered 2020-08-14: 3.25 mg via SUBCUTANEOUS
  Filled 2020-08-14: qty 1.3

## 2020-08-14 MED ORDER — DEXAMETHASONE 4 MG PO TABS
20.0000 mg | ORAL_TABLET | Freq: Once | ORAL | Status: AC
Start: 2020-08-14 — End: 2020-08-14
  Administered 2020-08-14: 20 mg via ORAL
  Filled 2020-08-14: qty 5

## 2020-08-14 MED ORDER — POTASSIUM CHLORIDE 10 MEQ/100ML IV SOLN
10.0000 meq | Freq: Once | INTRAVENOUS | Status: AC
Start: 2020-08-14 — End: 2020-08-14
  Administered 2020-08-14: 10 meq via INTRAVENOUS
  Filled 2020-08-14: qty 100

## 2020-08-14 MED ORDER — POTASSIUM CHLORIDE 10 MEQ/100ML IV SOLN: 10.0000 meq | Freq: Once | INTRAVENOUS | Status: DC

## 2020-08-14 NOTE — Patient Instructions (Signed)
[facility]  Discharge Instructions: Thank you for choosing [facility] to provide your oncology and hematology care.  If you have a lab appointment with the Cancer Center, please go directly to the Cancer Center and check in at the registration area.  Wear comfortable clothing and clothing appropriate for easy access to any Portacath or PICC line.   We strive to give you quality time with your provider. You may need to reschedule your appointment if you arrive late (15 or more minutes).  Arriving late affects you and other patients whose appointments are after yours.  Also, if you miss three or more appointments without notifying the office, you may be dismissed from the clinic at the provider's discretion.      For prescription refill requests, have your pharmacy contact our office and allow 72 hours for refills to be completed.    Today you received the following chemotherapy and/or immunotherapy agents Velcade      To help prevent nausea and vomiting after your treatment, we encourage you to take your nausea medication as directed.  BELOW ARE SYMPTOMS THAT SHOULD BE REPORTED IMMEDIATELY: *FEVER GREATER THAN 100.4 F (38 C) OR HIGHER *CHILLS OR SWEATING *NAUSEA AND VOMITING THAT IS NOT CONTROLLED WITH YOUR NAUSEA MEDICATION *UNUSUAL SHORTNESS OF BREATH *UNUSUAL BRUISING OR BLEEDING *URINARY PROBLEMS (pain or burning when urinating, or frequent urination) *BOWEL PROBLEMS (unusual diarrhea, constipation, pain near the anus) TENDERNESS IN MOUTH AND THROAT WITH OR WITHOUT PRESENCE OF ULCERS (sore throat, sores in mouth, or a toothache) UNUSUAL RASH, SWELLING OR PAIN  UNUSUAL VAGINAL DISCHARGE OR ITCHING   Items with * indicate a potential emergency and should be followed up as soon as possible or go to the Emergency Department if any problems should occur.  Please show the CHEMOTHERAPY ALERT CARD or IMMUNOTHERAPY ALERT CARD at check-in to  the Emergency Department and triage nurse.  Should you have questions after your visit or need to cancel or reschedule your appointment, please contact [facility]  [phone] and follow the prompts.  Office hours are 8:00 a.m. to 4:30 p.m. Monday - Friday. Please note that voicemails left after 4:00 p.m. may not be returned until the following business day.  We are closed weekends and major holidays. You have access to a nurse at all times for urgent questions. Please call the main number to the clinic [phone] and follow the prompts.  For any non-urgent questions, you may also contact your provider using MyChart. We now offer e-Visits for anyone 18 and older to request care online for non-urgent symptoms. For details visit [email or web address].   Also download the MyChart app! Go to the app store, search "MyChart", open the app, select [facility], and log in with your MyChart username and password.  Due to Covid, a mask is required upon entering the hospital/clinic. If you do not have a mask, one will be given to you upon arrival. For doctor visits, patients may have 1 support person aged 18 or older with them. For treatment visits, patients cannot have anyone with them due to current Covid guidelines and our immunocompromised population.  

## 2020-08-14 NOTE — Progress Notes (Signed)
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

## 2020-08-14 NOTE — Progress Notes (Signed)
Patient on schedule for [facility] 08/18/2020, spoke with wife on phone with pre procedure instructions given. Made aware to be here @ 0730, NPO after MN prior to procedure, and driver post procedure/recovery/discharge. Stated understanding.

## 2020-08-15 LAB — IGG: IgG (Immunoglobin G), Serum: 595 mg/dL — ABNORMAL LOW (ref 603–1613)

## 2020-08-17 ENCOUNTER — Other Ambulatory Visit: Payer: Self-pay | Admitting: Student

## 2020-08-17 LAB — PROTEIN ELECTROPHORESIS, SERUM
A/G Ratio: 1.4 (ref 0.7–1.7)
Albumin ELP: 3.3 g/dL (ref 2.9–4.4)
Alpha-1-Globulin: 0.2 g/dL (ref 0.0–0.4)
Alpha-2-Globulin: 0.8 g/dL (ref 0.4–1.0)
Beta Globulin: 1.1 g/dL (ref 0.7–1.3)
Gamma Globulin: 0.3 g/dL — ABNORMAL LOW (ref 0.4–1.8)
Globulin, Total: 2.4 g/dL (ref 2.2–3.9)
M-Spike, %: 0.5 g/dL — ABNORMAL HIGH
Total Protein ELP: 5.7 g/dL — ABNORMAL LOW (ref 6.0–8.5)

## 2020-08-17 LAB — KAPPA/LAMBDA LIGHT CHAINS
Kappa free light chain: 17.8 mg/L (ref 3.3–19.4)
Kappa, lambda light chain ratio: 1.76 — ABNORMAL HIGH (ref 0.26–1.65)
Lambda free light chains: 10.1 mg/L (ref 5.7–26.3)

## 2020-08-18 ENCOUNTER — Ambulatory Visit
Admission: RE | Admit: 2020-08-18 | Discharge: 2020-08-18 | Disposition: A | Discharge status: Home / Self Care | Payer: Medicare Other | Source: Ambulatory Visit | Attending: Oncology | Admitting: Oncology

## 2020-08-18 ENCOUNTER — Other Ambulatory Visit: Payer: Self-pay

## 2020-08-18 DIAGNOSIS — Z808 Family history of malignant neoplasm of other organs or systems: Secondary | ICD-10-CM | POA: Insufficient documentation

## 2020-08-18 DIAGNOSIS — C9000 Multiple myeloma not having achieved remission: Secondary | ICD-10-CM | POA: Insufficient documentation

## 2020-08-18 DIAGNOSIS — D721 Eosinophilia, unspecified: Secondary | ICD-10-CM | POA: Diagnosis not present

## 2020-08-18 DIAGNOSIS — D649 Anemia, unspecified: Secondary | ICD-10-CM | POA: Insufficient documentation

## 2020-08-18 DIAGNOSIS — F17210 Nicotine dependence, cigarettes, uncomplicated: Secondary | ICD-10-CM | POA: Diagnosis not present

## 2020-08-18 DIAGNOSIS — Z8042 Family history of malignant neoplasm of prostate: Secondary | ICD-10-CM | POA: Diagnosis not present

## 2020-08-18 HISTORY — DX: Malignant (primary) neoplasm, unspecified: C80.1

## 2020-08-18 HISTORY — DX: Multiple myeloma not having achieved remission: C90.00

## 2020-08-18 LAB — CBC WITH DIFFERENTIAL/PLATELET
Abs Immature Granulocytes: 0.03 10*3/uL (ref 0.00–0.07)
Basophils Absolute: 0 10*3/uL (ref 0.0–0.1)
Basophils Relative: 0 %
Eosinophils Absolute: 0.6 10*3/uL — ABNORMAL HIGH (ref 0.0–0.5)
Eosinophils Relative: 8 %
HCT: 36.9 % — ABNORMAL LOW (ref 39.0–52.0)
Hemoglobin: 11.8 g/dL — ABNORMAL LOW (ref 13.0–17.0)
Immature Granulocytes: 0 %
Lymphocytes Relative: 17 %
Lymphs Abs: 1.2 10*3/uL (ref 0.7–4.0)
MCH: 27.9 pg (ref 26.0–34.0)
MCHC: 32 g/dL (ref 30.0–36.0)
MCV: 87.2 fL (ref 80.0–100.0)
Monocytes Absolute: 1.2 10*3/uL — ABNORMAL HIGH (ref 0.1–1.0)
Monocytes Relative: 17 %
Neutro Abs: 4 10*3/uL (ref 1.7–7.7)
Neutrophils Relative %: 58 %
Platelets: 130 10*3/uL — ABNORMAL LOW (ref 150–400)
RBC: 4.23 MIL/uL (ref 4.22–5.81)
RDW: 17.2 % — ABNORMAL HIGH (ref 11.5–15.5)
WBC: 7 10*3/uL (ref 4.0–10.5)
nRBC: 0 % (ref 0.0–0.2)

## 2020-08-18 MED ORDER — SODIUM CHLORIDE 0.9 % IV SOLN: INTRAVENOUS | Status: DC

## 2020-08-18 MED ORDER — HEPARIN SOD (PORK) LOCK FLUSH 100 UNIT/ML IV SOLN
INTRAVENOUS | Status: AC
  Filled 2020-08-18: qty 5

## 2020-08-18 MED ORDER — FENTANYL CITRATE (PF) 100 MCG/2ML IJ SOLN
INTRAMUSCULAR | Status: AC
  Filled 2020-08-18: qty 2

## 2020-08-18 MED ORDER — LENALIDOMIDE 25 MG PO CAPS: 25.0000 mg | ORAL_CAPSULE | Freq: Every day | ORAL | 0 refills | Status: DC

## 2020-08-18 MED ORDER — SODIUM CHLORIDE 0.9 % IV SOLN
INTRAVENOUS | Status: AC | PRN
  Administered 2020-08-18: 250 mL/h via INTRAVENOUS

## 2020-08-18 MED ORDER — FENTANYL CITRATE (PF) 100 MCG/2ML IJ SOLN
INTRAMUSCULAR | Status: AC | PRN
  Administered 2020-08-18 (×2): 50 ug via INTRAVENOUS

## 2020-08-18 MED ORDER — MIDAZOLAM HCL 2 MG/2ML IJ SOLN
INTRAMUSCULAR | Status: AC
  Filled 2020-08-18: qty 2

## 2020-08-18 MED ORDER — MIDAZOLAM HCL 2 MG/2ML IJ SOLN
INTRAMUSCULAR | Status: AC | PRN
  Administered 2020-08-18 (×2): 1 mg via INTRAVENOUS

## 2020-08-18 NOTE — Procedures (Signed)
Interventional Radiology Procedure Note  Procedure: CT BM ASP AND CORE    Complications: None  Estimated Blood Loss:  MIN  Findings: 11 G ASP AND CORE    M. TREVOR Biana Haggar, MD    

## 2020-08-18 NOTE — H&P (Signed)
Chief Complaint:  Multiple myeloma  Referring Physician(s): Yu,Zhou   History of Present Illness: Tommy Smith. is a 65 y.o. male with known multiple myeloma undergoing chemotherapy.  He has known multiple osseous lytic lesions.  Largest bone lesion is in the left pubic bone compatible with metastatic disease.  Previous bone marrow biopsy 02/12/2020.  Overall he is doing very well.  No recent illness or fevers.  No current abdominal or pelvic pain.  No chest pain or shortness of breath.  He returns today for repeat bone marrow biopsy and restaging.  Past Medical History:  Diagnosis Date  . Cancer ([place])   . Depression   . Hypercalcemia 03/02/2020  . Hyperlipemia   . Hypertension   . Hypogonadism in male   . Multiple myeloma [facility])     Past Surgical History:  Procedure Laterality Date  . BONE MARROW BIOPSY    . COLONOSCOPY  07/14/2008    Allergies: Patient has no known allergies.  Medications: Prior to Admission medications   Medication Sig Start Date End Date Taking? Authorizing Provider  acyclovir (ZOVIRAX) 400 MG tablet TAKE 1 TABLET BY MOUTH TWICE A DAY 05/18/20  Yes Earlie Server, MD  amLODipine (NORVASC) 10 MG tablet Take by mouth daily. 05/31/19 08/18/20 Yes [provider]  ASPIRIN 81 PO Take 81 mg by mouth daily.   Yes [provider]  atorvastatin (LIPITOR) 40 MG tablet Take 40 mg by mouth daily.   Yes [provider]  buPROPion (WELLBUTRIN XL) 150 MG 24 hr tablet Take by mouth. 01/07/20 01/06/21 Yes [provider]  dutasteride (AVODART) 0.5 MG capsule Take 0.5 mg by mouth daily. 11/20/19 11/19/20 Yes [provider]  KLOR-CON M20 20 MEQ tablet TAKE 1 TABLET BY MOUTH TWICE A DAY 08/08/20  Yes Earlie Server, MD  losartan-hydrochlorothiazide [facility]) 100-12.5 MG tablet Take 1 tablet by mouth daily. 01/22/20  Yes [provider]  omeprazole (PRILOSEC) 20 MG capsule Take 1 capsule (20 mg total) by mouth daily. 03/18/20   Yes Earlie Server, MD  polyethylene glycol (MIRALAX) 17 g packet Take 17 g by mouth daily. 03/02/20  Yes Earlie Server, MD  Psyllium (METAMUCIL MULTIHEALTH FIBER PO) Take by mouth daily.   Yes [provider]  senna-docusate (SENNA S) 8.6-50 MG tablet Take 2 tablets by mouth daily. 08/05/20  Yes Earlie Server, MD  lenalidomide (REVLIMID) 25 MG capsule Take 1 capsule (25 mg total) by mouth daily. Take 14 days on, 7 days off, repeat every 21 days. 07/28/20   Earlie Server, MD     Family History  Problem Relation Age of Onset  . Skin cancer Mother   . Prostate cancer Father     Social History   Socioeconomic History  . Marital status: Married    Spouse name: Not on file  . Number of children: Not on file  . Years of education: Not on file  . Highest education level: Not on file  Occupational History  . Not on file  Tobacco Use  . Smoking status: Current Every Day Smoker    Packs/day: 1.00    Years: 30.00    Pack years: 30.00    Types: Cigarettes  . Smokeless tobacco: Never Used  Substance and Sexual Activity  . Alcohol use: Yes    Comment: occcassional  . Drug use: No  . Sexual activity: Not on file  Other Topics Concern  . Not on file  Social History Narrative  . Not  on file   Social Determinants of Health   Financial Resource Strain: Not on file  Food Insecurity: Not on file  Transportation Needs: Not on file  Physical Activity: Not on file  Stress: Not on file  Social Connections: Not on file    ECOG Status: 1 - Symptomatic but completely ambulatory  Review of Systems: A 12 point ROS discussed and pertinent positives are indicated in the HPI above.  All other systems are negative.  Review of Systems  Vital Signs: BP 111/73   Pulse 75   Temp 98.2 F (36.8 C) (Oral)   Ht 6' 3"  (1.905 m)   Wt 103 kg   SpO2 100%   BMI 28.37 kg/m   Physical Exam Constitutional:      General: He is not in acute distress.    Appearance: He is not ill-appearing or toxic-appearing.   Eyes:     General: No scleral icterus.    Conjunctiva/sclera: Conjunctivae normal.  Cardiovascular:     Rate and Rhythm: Normal rate and regular rhythm.     Heart sounds: Normal heart sounds. No murmur heard.   Pulmonary:     Effort: Pulmonary effort is normal.     Breath sounds: Normal breath sounds.  Abdominal:     General: Abdomen is flat. Bowel sounds are normal.     Palpations: Abdomen is soft.  Musculoskeletal:        General: No swelling.  Skin:    Coloration: Skin is not jaundiced.  Neurological:     General: No focal deficit present.     Mental Status: Mental status is at baseline.  Psychiatric:        Mood and Affect: Mood normal.     Imaging: No results found.  Labs:  CBC: Recent Labs    07/22/20 1402 07/31/20 1303 08/07/20 1051 08/14/20 1333  WBC 6.6 5.6 5.3 6.5  HGB 12.6* 12.4* 12.4* 11.7*  HCT 38.5* 37.8* 37.9* 35.7*  PLT 130* 141* 126* 149*    COAGS: Recent Labs    02/12/20 0805  INR 1.1    BMP: Recent Labs    07/22/20 1402 07/31/20 1303 08/07/20 1051 08/14/20 1333  NA 139 138 140 138  K 3.9 3.5 3.4* 3.2*  CL 102 103 103 100  CO2 29 25 29 28   GLUCOSE 92 100* 77 84  BUN 21 16 19  25*  CALCIUM 9.1 8.8* 9.2 9.3  CREATININE 1.12 1.02 0.88 1.19  GFRNONAA >60 >60 >60 >60    LIVER FUNCTION TESTS: Recent Labs    07/22/20 1402 07/31/20 1303 08/07/20 1051 08/14/20 1333  BILITOT 1.0 1.1 1.1 1.6*  AST 13* 15 18 13*  ALT 21 17 20 17   ALKPHOS 44 47 40 40  PROT 6.2* 6.3* 6.2* 6.2*  ALBUMIN 3.6 3.6 3.8 3.5    TUMOR MARKERS: No results for input(s): AFPTM, CEA, CA199, CHROMGRNA in the last 8760 hours.  Assessment and Plan:  Multiple myeloma undergoing chemotherapy.  Remission not yet achieved.  Plan for repeat CT bone marrow biopsy/staging today.  Risks and benefits of CT bone marrow biopsy was discussed with the patient and/or patient's family including, but not limited to bleeding, infection, damage to adjacent structures or  low yield requiring additional tests.  All of the questions were answered and there is agreement to proceed.  Consent signed and in chart.      Thank you for this interesting consult.  I greatly enjoyed meeting [employer]. and look forward to  participating in their care.  A copy of this report was sent to the requesting provider on this date.  Electronically Signed: Greggory Keen, MD 08/18/2020, 8:12 AM   I spent a total of  30 Minutes   in face to face in clinical consultation, greater than 50% of which was counseling/coordinating care for this patient with multiple myeloma.

## 2020-08-18 NOTE — Discharge Instructions (Signed)
Bone Marrow Aspiration and Bone Marrow Biopsy, Adult, Care After This sheet gives you information about how to care for yourself after your procedure. Your health care provider may also give you more specific instructions. If you have problems or questions, contact your health care provider. What can I expect after the procedure? After the procedure, it is common to have:  Mild pain and tenderness.  Swelling.  Bruising. Follow these instructions at home: Puncture site care  Follow instructions from your health care provider about how to take care of the puncture site. Make sure you: ? Wash your hands with soap and water before and after you change your bandage (dressing). If soap and water are not available, use hand sanitizer. ? Change your dressing as told by your health care provider.  Check your puncture site every day for signs of infection. Check for: ? More redness, swelling, or pain. ? Fluid or blood. ? Warmth. ? Pus or a bad smell.   Activity  Return to your normal activities as told by your health care provider. Ask your health care provider what activities are safe for you.  Do not lift anything that is heavier than 10 lb (4.5 kg), or the limit that you are told, until your health care provider says that it is safe.  Do not drive for 24 hours if you were given a sedative during your procedure. General instructions  Take over-the-counter and prescription medicines only as told by your health care provider.  Do not take baths, swim, or use a hot tub until your health care provider approves. Ask your health care provider if you may take showers. You may only be allowed to take sponge baths.  If directed, put ice on the affected area. To do this: ? Put ice in a plastic bag. ? Place a towel between your skin and the bag. ? Leave the ice on for 20 minutes, 2-3 times a day.  Keep all follow-up visits as told by your health care provider. This is important.   Contact a  health care provider if:  Your pain is not controlled with medicine.  You have a fever.  You have more redness, swelling, or pain around the puncture site.  You have fluid or blood coming from the puncture site.  Your puncture site feels warm to the touch.  You have pus or a bad smell coming from the puncture site. Summary  After the procedure, it is common to have mild pain, tenderness, swelling, and bruising.  Follow instructions from your health care provider about how to take care of the puncture site and what activities are safe for you.  Take over-the-counter and prescription medicines only as told by your health care provider.  Contact a health care provider if you have any signs of infection, such as fluid or blood coming from the puncture site. This information is not intended to replace advice given to you by your health care provider. Make sure you discuss any questions you have with your health care provider. Document Revised: 08/14/2018 Document Reviewed: 08/14/2018 Elsevier Patient Education  2021 Elsevier Inc.  

## 2020-08-18 NOTE — OR Nursing (Signed)
bp low left arm, switched to right arm, 103/69. Pt denies symptoms

## 2020-08-26 ENCOUNTER — Inpatient Hospital Stay: Payer: Medicare Other

## 2020-08-26 ENCOUNTER — Encounter: Payer: Self-pay | Admitting: Oncology

## 2020-08-26 ENCOUNTER — Inpatient Hospital Stay: Payer: Medicare Other | Admitting: Oncology

## 2020-08-26 ENCOUNTER — Other Ambulatory Visit: Payer: Self-pay

## 2020-08-26 VITALS — BP 101/67 | HR 77 | Temp 97.6°F | Resp 18 | Ht 75.0 in | Wt 227.0 lb

## 2020-08-26 DIAGNOSIS — C9000 Multiple myeloma not having achieved remission: Secondary | ICD-10-CM

## 2020-08-26 DIAGNOSIS — Z5111 Encounter for antineoplastic chemotherapy: Secondary | ICD-10-CM

## 2020-08-26 LAB — CBC WITH DIFFERENTIAL/PLATELET
Abs Immature Granulocytes: 0.02 10*3/uL (ref 0.00–0.07)
Basophils Absolute: 0 10*3/uL (ref 0.0–0.1)
Basophils Relative: 1 %
Eosinophils Absolute: 0.3 10*3/uL (ref 0.0–0.5)
Eosinophils Relative: 8 %
HCT: 35.4 % — ABNORMAL LOW (ref 39.0–52.0)
Hemoglobin: 11.6 g/dL — ABNORMAL LOW (ref 13.0–17.0)
Immature Granulocytes: 1 %
Lymphocytes Relative: 26 %
Lymphs Abs: 1.1 10*3/uL (ref 0.7–4.0)
MCH: 28.2 pg (ref 26.0–34.0)
MCHC: 32.8 g/dL (ref 30.0–36.0)
MCV: 85.9 fL (ref 80.0–100.0)
Monocytes Absolute: 0.7 10*3/uL (ref 0.1–1.0)
Monocytes Relative: 16 %
Neutro Abs: 2.1 10*3/uL (ref 1.7–7.7)
Neutrophils Relative %: 48 %
Platelets: 212 10*3/uL (ref 150–400)
RBC: 4.12 MIL/uL — ABNORMAL LOW (ref 4.22–5.81)
RDW: 17.2 % — ABNORMAL HIGH (ref 11.5–15.5)
WBC: 4.3 10*3/uL (ref 4.0–10.5)
nRBC: 0 % (ref 0.0–0.2)

## 2020-08-26 LAB — COMPREHENSIVE METABOLIC PANEL
ALT: 26 U/L (ref 0–44)
AST: 23 U/L (ref 15–41)
Albumin: 3.5 g/dL (ref 3.5–5.0)
Alkaline Phosphatase: 43 U/L (ref 38–126)
Anion gap: 7 (ref 5–15)
BUN: 20 mg/dL (ref 8–23)
CO2: 26 mmol/L (ref 22–32)
Calcium: 8.8 mg/dL — ABNORMAL LOW (ref 8.9–10.3)
Chloride: 106 mmol/L (ref 98–111)
Creatinine, Ser: 0.87 mg/dL (ref 0.61–1.24)
GFR, Estimated: >60 mL/min (ref >60)
Glucose, Bld: 115 mg/dL — ABNORMAL HIGH (ref 70–99)
Potassium: 3.6 mmol/L (ref 3.5–5.1)
Sodium: 139 mmol/L (ref 135–145)
Total Bilirubin: 0.7 mg/dL (ref 0.3–1.2)
Total Protein: 6.2 g/dL — ABNORMAL LOW (ref 6.5–8.1)

## 2020-08-26 NOTE — Progress Notes (Signed)
Hematology/Oncology follow up note [facility] [phone] [phone]   Patient Care Team: Sofie Hartigan, MD as PCP - General (Family Medicine) Noreene Filbert, MD as Radiation Oncologist (Radiation Oncology)  REFERRING PROVIDER: Sofie Hartigan, MD  CHIEF COMPLAINTS/REASON FOR VISIT:  Follow-up for multiple myeloma HISTORY OF PRESENTING ILLNESS:   Tommy Smith. is a  65 y.o.  male with PMH listed below was seen in consultation at the request of  Sofie Hartigan, MD  for evaluation of abnormal serum protein electrophoresis.  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  Increase calcium level at 12.3, PTH was normal.  Creatinine 1, estimated GFR 91, CBC showed hemoglobin 12.4, Patient also recently had a CT chest abdomen pelvis with contrast for evaluation of right flank pain and right lower lobe nodule. 01/29/2020, CT showed multiple bilateral pulmonary nodules measuring up to 10 mm in the right middle lobe, nodules are indeterminate, infectious/inflammatory etiology versus metastatic disease. Multiple osseous lytic lesions, largest lesion involving the left pubic bone concerning for metastatic disease. Compression fractures at T9, age indeterminate. Appearance focal area of thickening at the gastroesophageal junction.  Further evaluation with upper GI study or direct visualization with endoscopy is recommended No bowel obstruction. Aortic atherosclerosis.  Patient reports NSAIDs as needed for lower back pain.  Mid lower back pain usually is worse when when he stands up from sitting position.  Patient works in the [employer].  Lives at home with wife.  Denies any unintentional weight loss, fever, chills, night sweating  # IgG multiple myeloma, stage II, del 1 p, high risk Baseline M protein 4.1, kappa light chain level 333.8 Beta-2 microglobulin 2.1, albumin 2.7. 02/12/2020, bone marrow  biopsy showed hypercellular for age, 80% plasma cell which are complex restricted by light chain in situ heparinization.  Background hematopoiesis is present but reduced. Cytogenetics is normal, Myeloma FISH panel-deletion 1P,Standard risk.  # 02/27/2020 patient has been started on Revlimid 25 mg D1-14 #02/24/2020, Velcade weekly with dexamethasone 20 mg weekly  Patient does not want to consider bone marrow transplant at this point.  #Focal area of thickening at the GE junction.  I recommend GI work-up and patient declined at this point. #Lung nodules, measures up to 10 mm on the right middle lobe.  Indeterminate.  Attention on follow-up.  Recommend repeat CT chest now and he declined   INTERVAL HISTORY Tommy Smith. is a 65 y.o. male who has above history reviewed by me today presents for follow up visit for multiple myeloma/evaluation discussion bone marrow results.. Problems and complaints are listed below: Patient reports feeling well. Has noticed bilateral ankle discomfort recently.  He denies calf tenderness or neck swelling. Status post bone marrow biopsy for reevaluation.  Review of Systems  Constitutional: Negative for appetite change, chills, fatigue, fever and unexpected weight change.  HENT:   Negative for hearing loss and voice change.   Eyes: Negative for eye problems and icterus.  Respiratory: Negative for chest tightness, cough and shortness of breath.   Cardiovascular: Negative for chest pain and leg swelling.  Gastrointestinal: Negative for abdominal distention, abdominal pain and constipation.  Endocrine: Negative for hot flashes.  Genitourinary: Negative for difficulty urinating, dysuria and frequency.   Musculoskeletal: Negative for arthralgias, back pain and neck pain.  Skin: Negative for itching and rash.  Neurological: Negative for light-headedness and numbness.  Hematological: Negative for adenopathy. Does not bruise/bleed easily.  Psychiatric/Behavioral:  Negative for confusion.     MEDICAL  HISTORY:  Past Medical History:  Diagnosis Date  . Cancer ([place])   . Depression   . Hypercalcemia 03/02/2020  . Hyperlipemia   . Hypertension   . Hypogonadism in male   . Multiple myeloma ([place])     SURGICAL HISTORY: Past Surgical History:  Procedure Laterality Date  . BONE MARROW BIOPSY    . COLONOSCOPY  07/14/2008    SOCIAL HISTORY: Social History   Socioeconomic History  . Marital status: Married    Spouse name: Not on file  . Number of children: Not on file  . Years of education: Not on file  . Highest education level: Not on file  Occupational History  . Not on file  Tobacco Use  . Smoking status: Current Every Day Smoker    Packs/day: 1.00    Years: 30.00    Pack years: 30.00    Types: Cigarettes  . Smokeless tobacco: Never Used  Substance and Sexual Activity  . Alcohol use: Yes    Comment: occcassional  . Drug use: No  . Sexual activity: Not on file  Other Topics Concern  . Not on file  Social History Narrative  . Not on file   Social Determinants of Health   Financial Resource Strain: Not on file  Food Insecurity: Not on file  Transportation Needs: Not on file  Physical Activity: Not on file  Stress: Not on file  Social Connections: Not on file  Intimate Partner Violence: Not on file    FAMILY HISTORY: Family History  Problem Relation Age of Onset  . Skin cancer Mother   . Prostate cancer Father     ALLERGIES:  has No Known Allergies.  MEDICATIONS:  Current Outpatient Medications  Medication Sig Dispense Refill  . acyclovir (ZOVIRAX) 400 MG tablet TAKE 1 TABLET BY MOUTH TWICE A DAY 180 tablet 1  . amLODipine (NORVASC) 10 MG tablet Take by mouth daily.    . ASPIRIN 81 PO Take 81 mg by mouth daily.    Marland Kitchen atorvastatin (LIPITOR) 40 MG tablet Take 40 mg by mouth daily.    Marland Kitchen buPROPion (WELLBUTRIN XL) 150 MG 24 hr tablet Take by mouth.    . dutasteride (AVODART) 0.5 MG capsule Take 0.5 mg by mouth daily.     Marland Kitchen KLOR-CON M20 20 MEQ tablet TAKE 1 TABLET BY MOUTH TWICE A DAY 60 tablet 0  . losartan-hydrochlorothiazide (HYZAAR) 100-12.5 MG tablet Take 1 tablet by mouth daily.    Marland Kitchen omeprazole (PRILOSEC) 20 MG capsule Take 1 capsule (20 mg total) by mouth daily. 30 capsule 1  . polyethylene glycol (MIRALAX) 17 g packet Take 17 g by mouth daily. 14 each 0  . Psyllium (METAMUCIL MULTIHEALTH FIBER PO) Take by mouth daily.    Marland Kitchen senna-docusate (SENNA S) 8.6-50 MG tablet Take 2 tablets by mouth daily. 60 tablet 3  . lenalidomide (REVLIMID) 25 MG capsule Take 1 capsule (25 mg total) by mouth daily. Take 14 days on, 7 days off, repeat every 21 days. (Patient not taking: Reported on 08/26/2020) 14 capsule 0   No current facility-administered medications for this visit.     PHYSICAL EXAMINATION: ECOG PERFORMANCE STATUS: 1 - Symptomatic but completely ambulatory Vitals:   08/26/20 1319  BP: 101/67  Pulse: 77  Resp: 18  Temp: 97.6 F (36.4 C)   Filed Weights   08/26/20 1319  Weight: 227 lb (103 kg)    Physical Exam Constitutional:      General: He is not in acute distress.  Appearance: He is not diaphoretic.  HENT:     Head: Normocephalic and atraumatic.     Nose: Nose normal.     Mouth/Throat:     Pharynx: No oropharyngeal exudate.  Eyes:     General: No scleral icterus.    Pupils: Pupils are equal, round, and reactive to light.  Cardiovascular:     Rate and Rhythm: Normal rate and regular rhythm.     Heart sounds: No murmur heard.   Pulmonary:     Effort: Pulmonary effort is normal. No respiratory distress.     Breath sounds: No rales.  Chest:     Chest wall: No tenderness.  Abdominal:     General: There is no distension.     Palpations: Abdomen is soft.     Tenderness: There is no abdominal tenderness.  Musculoskeletal:        General: Normal range of motion.     Cervical back: Normal range of motion and neck supple.  Skin:    General: Skin is warm and dry.     Findings: No  erythema.  Neurological:     Mental Status: He is alert and oriented to person, place, and time.     Cranial Nerves: No cranial nerve deficit.     Motor: No abnormal muscle tone.     Coordination: Coordination normal.  Psychiatric:        Mood and Affect: Affect normal.       LABORATORY DATA:  I have reviewed the data as listed Lab Results  Component Value Date   WBC 4.3 08/26/2020   HGB 11.6 (L) 08/26/2020   HCT 35.4 (L) 08/26/2020   MCV 85.9 08/26/2020   PLT 212 08/26/2020   Recent Labs    08/07/20 1051 08/14/20 1333 08/26/20 1301  NA 140 138 139  K 3.4* 3.2* 3.6  CL 103 100 106  CO2 _0 GLUCOSE 77 84 115*  BUN 19 25* 20  CREATININE 0.88 1.19 0.87  CALCIUM 9.2 9.3 8.8*  GFRNONAA >60 >60 >60  PROT 6.2* 6.2* 6.2*  ALBUMIN 3.8 3.5 3.5  AST 18 13* 23  ALT _1 ALKPHOS 40 40 43  BILITOT 1.1 1.6* 0.7   Iron/TIBC/Ferritin/ %Sat No results found for: IRON, TIBC, FERRITIN, IRONPCTSAT    RADIOGRAPHIC STUDIES: I have personally reviewed the radiological images as listed and agreed with the findings in the report. CT BONE MARROW BIOPSY & ASPIRATION  Result Date: 08/18/2020 INDICATION: Multiple myeloma, restaging exam EXAM: CT GUIDED RIGHT ILIAC BONE MARROW ASPIRATION AND CORE BIOPSY Date:  08/18/2020 08/18/2020 9:02 am Radiologist:  M. Daryll Brod, MD Guidance:  CT FLUOROSCOPY TIME:  Fluoroscopy Time: None. MEDICATIONS: 1% lidocaine local ANESTHESIA/SEDATION: 2.0 mg IV Versed; 100 mcg IV Fentanyl Moderate Sedation Time:  14 minutes The patient was continuously monitored during the procedure by the interventional radiology nurse under my direct supervision. CONTRAST:  None. COMPLICATIONS: None PROCEDURE: Informed consent was obtained from the patient following explanation of the procedure, risks, benefits and alternatives. The patient understands, agrees and consents for the procedure. All questions were addressed. A time out was performed. The patient was  positioned prone and non-contrast localization CT was performed of the pelvis to demonstrate the iliac marrow spaces. Maximal barrier sterile technique utilized including caps, mask, sterile gowns, sterile gloves, large sterile drape, hand hygiene, and Betadine prep. Under sterile conditions and local anesthesia, an 11 gauge coaxial bone biopsy needle was advanced into the right iliac  marrow space. Needle position was confirmed with CT imaging. Initially, bone marrow aspiration was performed. Next, the 11 gauge outer cannula was utilized to obtain a right iliac bone marrow core biopsy. Needle was removed. Hemostasis was obtained with compression. The patient tolerated the procedure well. Samples were prepared with the cytotechnologist. No immediate complications. IMPRESSION: CT guided right iliac bone marrow aspiration and core biopsy. Electronically Signed   By: Jerilynn Mages.  Shick M.D.   On: 08/18/2020 10:00      ASSESSMENT & PLAN:  1. Multiple myeloma not having achieved remission ([place])   2. Encounter for antineoplastic chemotherapy   Cancer Staging Multiple myeloma not having achieved remission ([place]) Staging form: Plasma Cell Myeloma and Plasma Cell Disorders, AJCC 8th Edition - Clinical stage from 02/04/2020: Beta-2-microglobulin (mg/L): 2.1, Albumin (g/dL): 2.7, ISS: Stage II, High-risk cytogenetics: Absent, LDH: Unknown - Signed by Earlie Server, MD on 05/06/2020   #IgG multiple myeloma, stage II, del 1 p, high risk.  Declined bone marrow transplant evaluation. 1st treatment with RVD, status post 8 cycles. Labs reviewed and discussed with patient. M protein 0.5, close to 90% reduction from original M protein of 1.1  Bone marrow results were reviewed.  1% plasma cell.  Normal cytogenetics.  Myeloma FISH negative for 1p del. Very good partial response. We discussed about option of being referred to tertiary center for evaluation of bone marrow transplant.  He declines. Recommend patient to proceed with 2-4  additional cycles of RVD, hopefully to achieve CR prior to starting maintenance therapy. Proceed with next cycle of Revlimid when he gets it.  We will arrange patient to start Velcade weekly. 25 mg 2 weeks on  1 week off.  Continue  aspirin 81 mg daily for DVT prophylaxis. Continue acyclovir 400 mg twice daily.  #Bone lesion, Zometa every 4 to 6 weeks.  08/07/2020.  Zometa at next visit.  #Hypocalcemia, mild.  recommend calcium 1200 mg daily. 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  Hemoglobin stable. # hypokalemia, potassium stable at 3.5.  Continue potassium chloride 20 mEq daily.  We spent sufficient time to discuss many aspect of care, questions were answered to patient's satisfaction. All questions were answered. The patient knows to call the clinic with any problems questions or concerns.  cc Sofie Hartigan, MD   Return of visit: Lab/Velcade on 1 week in 2 weeks, lab MD Velcade in 3 weeks.  Earlie Server, MD, PhD Hematology Oncology [facility] Pager- [phone] 08/26/2020

## 2020-08-28 ENCOUNTER — Other Ambulatory Visit: Payer: Self-pay

## 2020-08-28 ENCOUNTER — Telehealth: Payer: Self-pay | Admitting: Oncology

## 2020-08-28 ENCOUNTER — Telehealth: Payer: Self-pay

## 2020-08-28 ENCOUNTER — Encounter (HOSPITAL_COMMUNITY): Payer: Self-pay | Admitting: Oncology

## 2020-08-28 NOTE — Telephone Encounter (Signed)
Patient returned my call about scheduling his lab/velcade appointments.  He is very confused as he thought we were holding off on treatment.  I directed him to the mychart message that was sent as he had not read it yet.  Patient requests a call on Monday.  I told him for now I will leave the scheduled appointments as they are.  He also stressed concern about not being able to get off of work.  Routing to clinical team for follow up.

## 2020-08-28 NOTE — Telephone Encounter (Signed)
Per Dr. Tasia Catchings, Refill Revlimid and schedule patient for lab/Velcade when pt receives Revlmid. REfill sent to [facility], spoke to pharmacy rep and she states that pt will have Revlimid delivered on Tuesday. Scheduling pool message sent to schedule pt on Tuesday for lab/ velcade and 2 additional infusions to equal a total of 3. Unable to reach pt by phone. Myhcart message sent to pt.

## 2020-08-28 NOTE — Telephone Encounter (Signed)
Per IB message lab/velcade on Tuesday 5/24, 5/31 & 6/7 scheduled.  Left vm for patient to call if the times do not work.  Mailing appointment reminder as well.

## 2020-08-31 ENCOUNTER — Encounter: Payer: Self-pay | Admitting: Oncology

## 2020-08-31 NOTE — Telephone Encounter (Signed)
Patient will see Dr. Tasia Catchings tomorrow for discussion.

## 2020-08-31 NOTE — Telephone Encounter (Signed)
Per Dr. Tasia Catchings, she called pt multiple times last week to give him an update regarding his care but was unable to reach him. Sent him Mychart message to check and see if there is a good time that he can be reached.

## 2020-09-01 ENCOUNTER — Inpatient Hospital Stay: Payer: Medicare Other

## 2020-09-01 ENCOUNTER — Telehealth: Payer: Self-pay

## 2020-09-01 ENCOUNTER — Other Ambulatory Visit: Payer: Self-pay

## 2020-09-01 ENCOUNTER — Encounter: Payer: Self-pay | Admitting: Oncology

## 2020-09-01 ENCOUNTER — Encounter (HOSPITAL_COMMUNITY): Payer: Self-pay | Admitting: Oncology

## 2020-09-01 ENCOUNTER — Inpatient Hospital Stay (HOSPITAL_BASED_OUTPATIENT_CLINIC_OR_DEPARTMENT_OTHER): Payer: Medicare Other | Admitting: Oncology

## 2020-09-01 VITALS — BP 102/73 | HR 81 | Temp 98.3°F | Resp 18 | Wt 225.2 lb

## 2020-09-01 DIAGNOSIS — C9000 Multiple myeloma not having achieved remission: Secondary | ICD-10-CM

## 2020-09-01 DIAGNOSIS — E876 Hypokalemia: Secondary | ICD-10-CM | POA: Diagnosis not present

## 2020-09-01 LAB — CBC WITH DIFFERENTIAL/PLATELET
Abs Immature Granulocytes: 0 10*3/uL (ref 0.00–0.07)
Basophils Absolute: 0 10*3/uL (ref 0.0–0.1)
Basophils Relative: 1 %
Eosinophils Absolute: 0.3 10*3/uL (ref 0.0–0.5)
Eosinophils Relative: 6 %
HCT: 38 % — ABNORMAL LOW (ref 39.0–52.0)
Hemoglobin: 12.4 g/dL — ABNORMAL LOW (ref 13.0–17.0)
Immature Granulocytes: 0 %
Lymphocytes Relative: 33 %
Lymphs Abs: 1.3 10*3/uL (ref 0.7–4.0)
MCH: 28.1 pg (ref 26.0–34.0)
MCHC: 32.6 g/dL (ref 30.0–36.0)
MCV: 86.2 fL (ref 80.0–100.0)
Monocytes Absolute: 0.5 10*3/uL (ref 0.1–1.0)
Monocytes Relative: 13 %
Neutro Abs: 2 10*3/uL (ref 1.7–7.7)
Neutrophils Relative %: 47 %
Platelets: 275 10*3/uL (ref 150–400)
RBC: 4.41 MIL/uL (ref 4.22–5.81)
RDW: 17.5 % — ABNORMAL HIGH (ref 11.5–15.5)
WBC: 4.1 10*3/uL (ref 4.0–10.5)
nRBC: 0 % (ref 0.0–0.2)

## 2020-09-01 LAB — COMPREHENSIVE METABOLIC PANEL
ALT: 24 U/L (ref 0–44)
AST: 15 U/L (ref 15–41)
Albumin: 3.8 g/dL (ref 3.5–5.0)
Alkaline Phosphatase: 49 U/L (ref 38–126)
Anion gap: 10 (ref 5–15)
BUN: 15 mg/dL (ref 8–23)
CO2: 27 mmol/L (ref 22–32)
Calcium: 9.4 mg/dL (ref 8.9–10.3)
Chloride: 103 mmol/L (ref 98–111)
Creatinine, Ser: 0.86 mg/dL (ref 0.61–1.24)
GFR, Estimated: >60 mL/min (ref >60)
Glucose, Bld: 90 mg/dL (ref 70–99)
Potassium: 3.9 mmol/L (ref 3.5–5.1)
Sodium: 140 mmol/L (ref 135–145)
Total Bilirubin: 1.2 mg/dL (ref 0.3–1.2)
Total Protein: 6.7 g/dL (ref 6.5–8.1)

## 2020-09-01 NOTE — Progress Notes (Addendum)
Hematology/Oncology follow up note [facility] Telephone:([phone] Fax:([phone]) [phone]   Patient Care Team: Sofie Hartigan, MD as PCP - General (Family Medicine) Noreene Filbert, MD as Radiation Oncologist (Radiation Oncology)  REFERRING PROVIDER: Sofie Hartigan, MD  CHIEF COMPLAINTS/REASON FOR VISIT:  Follow-up for multiple myeloma HISTORY OF PRESENTING ILLNESS:   Tommy Smith. is a  65 y.o.  male with PMH listed below was seen in consultation at the request of  Sofie Hartigan, MD  for evaluation of abnormal serum protein electrophoresis.  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  Increase calcium level at 12.3, PTH was normal.  Creatinine 1, estimated GFR 91, CBC showed hemoglobin 12.4, Patient also recently had a CT chest abdomen pelvis with contrast for evaluation of right flank pain and right lower lobe nodule. 01/29/2020, CT showed multiple bilateral pulmonary nodules measuring up to 10 mm in the right middle lobe, nodules are indeterminate, infectious/inflammatory etiology versus metastatic disease. Multiple osseous lytic lesions, largest lesion involving the left pubic bone concerning for metastatic disease. Compression fractures at T9, age indeterminate. Appearance focal area of thickening at the gastroesophageal junction.  Further evaluation with upper GI study or direct visualization with endoscopy is recommended No bowel obstruction. Aortic atherosclerosis.  Patient reports NSAIDs as needed for lower back pain.  Mid lower back pain usually is worse when when he stands up from sitting position.  Patient works in the [employer].  Lives at home with wife.  Denies any unintentional weight loss, fever, chills, night sweating  # IgG multiple myeloma, stage II, del 1 p, high risk Baseline M protein 4.1, kappa light chain level 333.8 Beta-2 microglobulin 2.1, albumin 2.7. 02/12/2020, bone marrow  biopsy showed hypercellular for age, 80% plasma cell which are complex restricted by light chain in situ heparinization.  Background hematopoiesis is present but reduced. Cytogenetics is normal, Myeloma FISH panel-deletion 1P,Standard risk.  # 02/27/2020 patient has been started on Revlimid 25 mg D1-14 #02/24/2020, Velcade weekly with dexamethasone 20 mg weekly  Patient does not want to consider bone marrow transplant at this point.  #Focal area of thickening at the GE junction.  I recommend GI work-up and patient declined at this point. #Lung nodules, measures up to 10 mm on the right middle lobe.  Indeterminate.  Attention on follow-up.  Recommend repeat CT chest now and he declined   INTERVAL HISTORY [name]. is a 65 y.o. male who has above history reviewed by me today presents for follow up visit for multiple myeloma/evaluation discussion bone marrow results.. Problems and complaints are listed below: Patient reports feeling well. No new complaints.   Review of Systems  Constitutional: Negative for appetite change, chills, fatigue, fever and unexpected weight change.  HENT:   Negative for hearing loss and voice change.   Eyes: Negative for eye problems and icterus.  Respiratory: Negative for chest tightness, cough and shortness of breath.   Cardiovascular: Negative for chest pain and leg swelling.  Gastrointestinal: Negative for abdominal distention, abdominal pain and constipation.  Endocrine: Negative for hot flashes.  Genitourinary: Negative for difficulty urinating, dysuria and frequency.   Musculoskeletal: Negative for arthralgias, back pain and neck pain.  Skin: Negative for itching and rash.  Neurological: Negative for light-headedness and numbness.  Hematological: Negative for adenopathy. Does not bruise/bleed easily.  Psychiatric/Behavioral: Negative for confusion.     MEDICAL HISTORY:  Past Medical History:  Diagnosis Date   Cancer [place])    Depression  Hypercalcemia 03/02/2020   Hyperlipemia    Hypertension    Hypogonadism in male    Multiple myeloma [facility])     SURGICAL HISTORY: Past Surgical History:  Procedure Laterality Date   BONE MARROW BIOPSY     COLONOSCOPY  07/14/2008    SOCIAL HISTORY: Social History   Socioeconomic History   Marital status: Married    Spouse name: Not on file   Number of children: Not on file   Years of education: Not on file   Highest education level: Not on file  Occupational History   Not on file  Tobacco Use   Smoking status: Current Every Day Smoker    Packs/day: 1.00    Years: 30.00    Pack years: 30.00    Types: Cigarettes   Smokeless tobacco: Never Used  Substance and Sexual Activity   Alcohol use: Yes    Comment: occcassional   Drug use: No   Sexual activity: Not on file  Other Topics Concern   Not on file  Social History Narrative   Not on file   Social Determinants of Health   Financial Resource Strain: Not on file  Food Insecurity: Not on file  Transportation Needs: Not on file  Physical Activity: Not on file  Stress: Not on file  Social Connections: Not on file  Intimate Partner Violence: Not on file    FAMILY HISTORY: Family History  Problem Relation Age of Onset   Skin cancer Mother    Prostate cancer Father     ALLERGIES:  has No Known Allergies.  MEDICATIONS:  Current Outpatient Medications  Medication Sig Dispense Refill   acyclovir (ZOVIRAX) 400 MG tablet TAKE 1 TABLET BY MOUTH TWICE A DAY 180 tablet 1   amLODipine (NORVASC) 10 MG tablet Take by mouth daily.     ASPIRIN 81 PO Take 81 mg by mouth daily.     atorvastatin (LIPITOR) 40 MG tablet Take 40 mg by mouth daily.     buPROPion (WELLBUTRIN XL) 150 MG 24 hr tablet Take by mouth.     dutasteride (AVODART) 0.5 MG capsule Take 0.5 mg by mouth daily.     KLOR-CON M20 20 MEQ tablet TAKE 1 TABLET BY MOUTH TWICE A DAY 60 tablet 0   lenalidomide (REVLIMID) 25 MG capsule Take 1 capsule (25 mg total) by  mouth daily. Take 14 days on, 7 days off, repeat every 21 days. 14 capsule 0   losartan-hydrochlorothiazide (HYZAAR) 100-12.5 MG tablet Take 1 tablet by mouth daily.     omeprazole (PRILOSEC) 20 MG capsule Take 1 capsule (20 mg total) by mouth daily. 30 capsule 1   polyethylene glycol (MIRALAX) 17 g packet Take 17 g by mouth daily. 14 each 0   Psyllium (METAMUCIL MULTIHEALTH FIBER PO) Take by mouth daily.     senna-docusate (SENNA S) 8.6-50 MG tablet Take 2 tablets by mouth daily. 60 tablet 3   No current facility-administered medications for this visit.     PHYSICAL EXAMINATION: ECOG PERFORMANCE STATUS: 1 - Symptomatic but completely ambulatory Vitals:   09/01/20 1426  BP: 102/73  Pulse: 81  Resp: 18  Temp: 98.3 F (36.8 C)   Filed Weights   09/01/20 1426  Weight: 225 lb 3.2 oz (102.2 kg)    Physical Exam Constitutional:      General: He is not in acute distress.    Appearance: He is not diaphoretic.  HENT:     Head: Normocephalic and atraumatic.     Nose:  Nose normal.     Mouth/Throat:     Pharynx: No oropharyngeal exudate.  Eyes:     General: No scleral icterus.    Pupils: Pupils are equal, round, and reactive to light.  Cardiovascular:     Rate and Rhythm: Normal rate and regular rhythm.     Heart sounds: No murmur heard.   Pulmonary:     Effort: Pulmonary effort is normal. No respiratory distress.     Breath sounds: No rales.  Chest:     Chest wall: No tenderness.  Abdominal:     General: There is no distension.     Palpations: Abdomen is soft.     Tenderness: There is no abdominal tenderness.  Musculoskeletal:        General: Normal range of motion.     Cervical back: Normal range of motion and neck supple.  Skin:    General: Skin is warm and dry.     Findings: No erythema.  Neurological:     Mental Status: He is alert and oriented to person, place, and time.     Cranial Nerves: No cranial nerve deficit.     Motor: No abnormal muscle tone.      Coordination: Coordination normal.  Psychiatric:        Mood and Affect: Affect normal.       LABORATORY DATA:  I have reviewed the data as listed Lab Results  Component Value Date   WBC 4.1 09/01/2020   HGB 12.4 (L) 09/01/2020   HCT 38.0 (L) 09/01/2020   MCV 86.2 09/01/2020   PLT 275 09/01/2020   Recent Labs    08/14/20 1333 08/26/20 1301 09/01/20 1408  NA 138 139 140  K 3.2* 3.6 3.9  CL 100 106 103  CO2 28 26 27   GLUCOSE 84 115* 90  BUN 25* 20 15  CREATININE 1.19 0.87 0.86  CALCIUM 9.3 8.8* 9.4  GFRNONAA >60 >60 >60  PROT 6.2* 6.2* 6.7  ALBUMIN 3.5 3.5 3.8  AST 13* 23 15  ALT 17 26 24   ALKPHOS 40 43 49  BILITOT 1.6* 0.7 1.2   Iron/TIBC/Ferritin/ %Sat No results found for: IRON, TIBC, FERRITIN, IRONPCTSAT    RADIOGRAPHIC STUDIES: I have personally reviewed the radiological images as listed and agreed with the findings in the report. CT BONE MARROW BIOPSY & ASPIRATION  Result Date: 08/18/2020 INDICATION: Multiple myeloma, restaging exam EXAM: CT GUIDED RIGHT ILIAC BONE MARROW ASPIRATION AND CORE BIOPSY Date:  08/18/2020 08/18/2020 9:02 am Radiologist:  M. Daryll Brod, MD Guidance:  CT FLUOROSCOPY TIME:  Fluoroscopy Time: None. MEDICATIONS: 1% lidocaine local ANESTHESIA/SEDATION: 2.0 mg IV Versed; 100 mcg IV Fentanyl Moderate Sedation Time:  14 minutes The patient was continuously monitored during the procedure by the interventional radiology nurse under my direct supervision. CONTRAST:  None. COMPLICATIONS: None PROCEDURE: Informed consent was obtained from the patient following explanation of the procedure, risks, benefits and alternatives. The patient understands, agrees and consents for the procedure. All questions were addressed. A time out was performed. The patient was positioned prone and non-contrast localization CT was performed of the pelvis to demonstrate the iliac marrow spaces. Maximal barrier sterile technique utilized including caps, mask, sterile gowns,  sterile gloves, large sterile drape, hand hygiene, and Betadine prep. Under sterile conditions and local anesthesia, an 11 gauge coaxial bone biopsy needle was advanced into the right iliac marrow space. Needle position was confirmed with CT imaging. Initially, bone marrow aspiration was performed. Next, the 11 gauge outer  cannula was utilized to obtain a right iliac bone marrow core biopsy. Needle was removed. Hemostasis was obtained with compression. The patient tolerated the procedure well. Samples were prepared with the cytotechnologist. No immediate complications. IMPRESSION: CT guided right iliac bone marrow aspiration and core biopsy. Electronically Signed   By: Jerilynn Mages.  Shick M.D.   On: 08/18/2020 10:00      ASSESSMENT & PLAN:  1. Multiple myeloma not having achieved remission ([place])   Cancer Staging Multiple myeloma not having achieved remission ([place]) Staging form: Plasma Cell Myeloma and Plasma Cell Disorders, AJCC 8th Edition - Clinical stage from 02/04/2020: Beta-2-microglobulin (mg/L): 2.1, Albumin (g/dL): 2.7, ISS: Stage II, High-risk cytogenetics: Absent, LDH: Unknown - Signed by Earlie Server, MD on 05/06/2020   #IgG multiple myeloma, stage II, del 1 p, high risk.  Declined bone marrow transplant evaluation. 1st treatment with RVD, status post 8 cycles. Labs reviewed and discussed with patient. M protein 0.5, close to 90% reduction from original M protein of 4.1  Bone marrow results were reviewed.  1% plasma cell.  Normal cytogenetics.  Myeloma FISH negative for 1p del. Very good partial response. Continue  aspirin 81 mg daily for DVT prophylaxis. Continue acyclovir 400 mg twice daily. I discussed with patient again about bone marrow transplant evaluation and he agrees to go.  He prefers to hold off treatment until his evaluation at [facility].  Refer to [facility] myeloma team   #Bone lesion, Zometa every 4 to 6 weeks. Last dose 08/07/2020. Marland Kitchen  #Hypocalcemia, mild.  recommend calcium 1200 mg  daily. 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  Hemoglobin stable. # hypokalemia, potassium stable at 3.5.  Continue potassium chloride 20 mEq daily.  We spent sufficient time to discuss many aspect of care, questions were answered to patient's satisfaction. All questions were answered. The patient knows to call the clinic with any problems questions or concerns.  cc Sofie Hartigan, MD   Return of visit:  To be determined.   Earlie Server, MD, PhD Hematology Oncology [facility] Pager- [phone] 09/01/2020

## 2020-09-01 NOTE — Telephone Encounter (Signed)
Referral faxed to [facility] bone marrow transplant clinic.    [place]: [phone] F: [phone]

## 2020-09-01 NOTE — Progress Notes (Signed)
Pt here for follow up. No new problems voiced.

## 2020-09-02 ENCOUNTER — Encounter: Payer: Self-pay | Admitting: Oncology

## 2020-09-02 LAB — KAPPA/LAMBDA LIGHT CHAINS
Kappa free light chain: 13.3 mg/L (ref 3.3–19.4)
Kappa, lambda light chain ratio: 1.87 — ABNORMAL HIGH (ref 0.26–1.65)
Lambda free light chains: 7.1 mg/L (ref 5.7–26.3)

## 2020-09-02 LAB — IGG: IgG (Immunoglobin G), Serum: 673 mg/dL (ref 603–1613)

## 2020-09-03 ENCOUNTER — Encounter: Payer: Self-pay | Admitting: Oncology

## 2020-09-03 LAB — PROTEIN ELECTROPHORESIS, SERUM
A/G Ratio: 1.6 (ref 0.7–1.7)
Albumin ELP: 3.6 g/dL (ref 2.9–4.4)
Alpha-1-Globulin: 0.2 g/dL (ref 0.0–0.4)
Alpha-2-Globulin: 0.8 g/dL (ref 0.4–1.0)
Beta Globulin: 1.1 g/dL (ref 0.7–1.3)
Gamma Globulin: 0.2 g/dL — ABNORMAL LOW (ref 0.4–1.8)
Globulin, Total: 2.3 g/dL (ref 2.2–3.9)
M-Spike, %: 0.3 g/dL — ABNORMAL HIGH
Total Protein ELP: 5.9 g/dL — ABNORMAL LOW (ref 6.0–8.5)

## 2020-09-08 ENCOUNTER — Inpatient Hospital Stay: Payer: Medicare Other

## 2020-09-08 ENCOUNTER — Other Ambulatory Visit: Payer: Self-pay | Admitting: Oncology

## 2020-09-09 LAB — SURGICAL PATHOLOGY

## 2020-09-10 NOTE — Telephone Encounter (Signed)
Confirmed on 5/26 that referral was received and is in review.

## 2020-09-15 ENCOUNTER — Other Ambulatory Visit: Payer: Medicare Other

## 2020-09-15 ENCOUNTER — Ambulatory Visit: Payer: Medicare Other

## 2020-09-17 ENCOUNTER — Other Ambulatory Visit: Payer: Self-pay | Admitting: *Deleted

## 2020-09-17 DIAGNOSIS — C9000 Multiple myeloma not having achieved remission: Secondary | ICD-10-CM

## 2020-09-22 ENCOUNTER — Encounter: Payer: Self-pay | Admitting: Oncology

## 2020-09-22 NOTE — Telephone Encounter (Signed)
Left message with NP coordinator to check status of referral.

## 2020-09-29 NOTE — Telephone Encounter (Signed)
Called [facility] for an update  The chart has been reviewed and is in the que for patient to be called for appointment scheduling.  MyChart message sent to patient with an update.

## 2020-10-05 NOTE — Telephone Encounter (Signed)
Called [facility] for an update and the NP coordinator will call the patient today to schedule an appt.

## 2020-10-06 ENCOUNTER — Encounter: Payer: Self-pay | Admitting: Oncology

## 2020-10-10 ENCOUNTER — Other Ambulatory Visit: Payer: Self-pay | Admitting: Oncology

## 2020-10-11 ENCOUNTER — Encounter: Payer: Self-pay | Admitting: Oncology

## 2020-10-14 ENCOUNTER — Other Ambulatory Visit: Payer: Self-pay

## 2020-10-14 ENCOUNTER — Ambulatory Visit (INDEPENDENT_AMBULATORY_CARE_PROVIDER_SITE_OTHER): Payer: Medicare Other | Admitting: Gastroenterology

## 2020-10-14 ENCOUNTER — Encounter: Payer: Self-pay | Admitting: Gastroenterology

## 2020-10-14 VITALS — BP 129/76 | HR 88 | Temp 98.5°F | Ht 75.0 in | Wt 211.2 lb

## 2020-10-14 DIAGNOSIS — R935 Abnormal findings on diagnostic imaging of other abdominal regions, including retroperitoneum: Secondary | ICD-10-CM | POA: Diagnosis not present

## 2020-10-14 NOTE — Progress Notes (Signed)
Jonathon Bellows MD, MRCP(U.K) [address]  [place]  [place], [place] [address]  Main: [phone]  Fax: [phone]   Gastroenterology Consultation  Referring Provider:     Sofie Hartigan, MD Primary Care Physician:  Sofie Hartigan, MD Primary Gastroenterologist:  Dr. Jonathon Bellows  Reason for Consultation:     Upper endoscopy to evaluate abnormal appearance seen on imaging        HPI:   Tommy Hagmann. is a 65 y.o. y/o male referred for consultation & management  by Dr. Tasia Catchings.  He follows with Dr. Tasia Catchings and hematology oncology and was recently seen on 09/01/2020 for multiple myeloma.  In October 2021 underwent a CT scan of the chest that was noted to have focal area of thickening at the GE junction.  Hence an EGD was recommended.   He denies any prior GI evaluation.  Occasional history of dysphagia.  Ongoing for a long period of time.  No other complaints.  He has lost some weight.  Past Medical History:  Diagnosis Date   Cancer ([place])    Depression    Hypercalcemia 03/02/2020   Hyperlipemia    Hypertension    Hypogonadism in male    Multiple myeloma [facility])     Past Surgical History:  Procedure Laterality Date   BONE MARROW BIOPSY     COLONOSCOPY  07/14/2008    Prior to Admission medications   Medication Sig Start Date End Date Taking? Authorizing Provider  acyclovir (ZOVIRAX) 400 MG tablet TAKE 1 TABLET BY MOUTH TWICE A DAY 05/18/20   Earlie Server, MD  amLODipine (NORVASC) 10 MG tablet Take by mouth daily. 05/31/19 09/01/20  [provider]  ASPIRIN 81 PO Take 81 mg by mouth daily.    [provider]  atorvastatin (LIPITOR) 40 MG tablet Take 40 mg by mouth daily.    [provider]  buPROPion (WELLBUTRIN XL) 150 MG 24 hr tablet Take by mouth. 01/07/20 01/06/21  [provider]  dutasteride (AVODART) 0.5 MG capsule Take 0.5 mg by mouth daily. 11/20/19 11/19/20  [provider]  KLOR-CON M20 20 MEQ tablet TAKE 1 TABLET BY MOUTH  TWICE A DAY 10/11/20   Earlie Server, MD  lenalidomide (REVLIMID) 25 MG capsule Take 1 capsule (25 mg total) by mouth daily. Take 14 days on, 7 days off, repeat every 21 days. 08/18/20   Earlie Server, MD  losartan-hydrochlorothiazide (HYZAAR) 100-12.5 MG tablet Take 1 tablet by mouth daily. 01/22/20   [provider]  omeprazole (PRILOSEC) 20 MG capsule Take 1 capsule (20 mg total) by mouth daily. 03/18/20   Earlie Server, MD  polyethylene glycol (MIRALAX) 17 g packet Take 17 g by mouth daily. 03/02/20   Earlie Server, MD  Psyllium (METAMUCIL MULTIHEALTH FIBER PO) Take by mouth daily.    [provider]  senna-docusate (SENNA S) 8.6-50 MG tablet Take 2 tablets by mouth daily. 08/05/20   Earlie Server, MD    Family History  Problem Relation Age of Onset   Skin cancer Mother    Prostate cancer Father      Social History   Tobacco Use   Smoking status: Every Day    Packs/day: 1.00    Years: 30.00    Pack years: 30.00    Types: Cigarettes   Smokeless tobacco: Never  Substance Use Topics   Alcohol use: Yes    Comment: occcassional   Drug use: No    Allergies as of 10/14/2020   (  No Known Allergies)    Review of Systems:    All systems reviewed and negative except where noted in HPI.   Physical Exam:  BP 129/76 (BP Location: Left Arm, Patient Position: Sitting, Cuff Size: Normal)   Pulse 88   Temp 98.5 F (36.9 C) (Oral)   Ht _0  (1.905 m)   Wt 211 lb 4 oz (95.8 kg)   BMI 26.40 kg/m  No LMP for male patient. Psych:  Alert and cooperative. Normal mood and affect. General:   Alert,  Well-developed, well-nourished, pleasant and cooperative in NAD Head:  Normocephalic and atraumatic. Eyes:  Sclera clear, no icterus.   Conjunctiva pink. Ears:  Normal auditory acuity. Lungs:  Respirations even and unlabored.  Clear throughout to auscultation.   No wheezes, crackles, or rhonchi. No acute distress. Heart:  Regular rate and rhythm; no murmurs, clicks, rubs, or gallops. Abdomen:  Normal  bowel sounds.  No bruits.  Soft, non-tender and non-distended without masses, hepatosplenomegaly or hernias noted.  No guarding or rebound tenderness.    Neurologic:  Alert and oriented x3;  grossly normal neurologically. Psych:  Alert and cooperative. Normal mood and affect.  Imaging Studies: No results found.  Assessment and Plan:   Tommy Smith. is a 64 y.o. y/o male who is under the care of Dr. Tasia Catchings for evaluation and treatment of multiple myeloma has been referred to me for evaluation of an abnormal area at the GE junction seen on a CAT scan in October 2021.  This area requires further evaluation with an upper endoscopy.  Some history of dysphagia and weight loss which could be related to the myeloma.  Not on any blood thinners.   Plan 1.  EGD  I have discussed alternative options, risks & benefits,  which include, but are not limited to, bleeding, infection, perforation,respiratory complication & drug reaction.  The patient agrees with this plan & written consent will be obtained.     Follow up in as needed  Dr Jonathon Bellows MD,MRCP(U.K)

## 2020-10-21 ENCOUNTER — Encounter: Payer: Self-pay | Admitting: Gastroenterology

## 2020-10-22 ENCOUNTER — Encounter
Admission: RE | Disposition: A | Discharge status: Home / Self Care | Payer: Self-pay | Source: Home / Self Care | Attending: Gastroenterology

## 2020-10-22 ENCOUNTER — Encounter: Payer: Self-pay | Admitting: Gastroenterology

## 2020-10-22 ENCOUNTER — Ambulatory Visit: Payer: Medicare Other | Admitting: Anesthesiology

## 2020-10-22 ENCOUNTER — Ambulatory Visit
Admission: RE | Admit: 2020-10-22 | Discharge: 2020-10-22 | Disposition: A | Discharge status: Home / Self Care | Payer: Medicare Other | Attending: Gastroenterology | Admitting: Gastroenterology

## 2020-10-22 ENCOUNTER — Other Ambulatory Visit: Payer: Self-pay

## 2020-10-22 DIAGNOSIS — R933 Abnormal findings on diagnostic imaging of other parts of digestive tract: Secondary | ICD-10-CM | POA: Insufficient documentation

## 2020-10-22 DIAGNOSIS — Z7982 Long term (current) use of aspirin: Secondary | ICD-10-CM | POA: Diagnosis not present

## 2020-10-22 DIAGNOSIS — Z79899 Other long term (current) drug therapy: Secondary | ICD-10-CM | POA: Insufficient documentation

## 2020-10-22 DIAGNOSIS — R935 Abnormal findings on diagnostic imaging of other abdominal regions, including retroperitoneum: Secondary | ICD-10-CM | POA: Diagnosis not present

## 2020-10-22 DIAGNOSIS — F17210 Nicotine dependence, cigarettes, uncomplicated: Secondary | ICD-10-CM | POA: Diagnosis not present

## 2020-10-22 HISTORY — PX: ESOPHAGOGASTRODUODENOSCOPY: SHX5428

## 2020-10-22 SURGERY — EGD (ESOPHAGOGASTRODUODENOSCOPY)
Anesthesia: General

## 2020-10-22 MED ORDER — PROPOFOL 500 MG/50ML IV EMUL
INTRAVENOUS | Status: DC | PRN
  Administered 2020-10-22: 150 ug/kg/min via INTRAVENOUS
  Administered 2020-10-22: 50 ug/kg/min via INTRAVENOUS

## 2020-10-22 MED ORDER — LIDOCAINE HCL (CARDIAC) PF 100 MG/5ML IV SOSY
PREFILLED_SYRINGE | INTRAVENOUS | Status: DC | PRN
  Administered 2020-10-22: 50 mg via INTRAVENOUS

## 2020-10-22 MED ORDER — LIDOCAINE HCL (PF) 2 % IJ SOLN
INTRAMUSCULAR | Status: AC
  Filled 2020-10-22: qty 5

## 2020-10-22 MED ORDER — PROPOFOL 500 MG/50ML IV EMUL
INTRAVENOUS | Status: AC
  Filled 2020-10-22: qty 100

## 2020-10-22 MED ORDER — PROPOFOL 500 MG/50ML IV EMUL
INTRAVENOUS | Status: AC
  Filled 2020-10-22: qty 50

## 2020-10-22 MED ORDER — SODIUM CHLORIDE 0.9 % IV SOLN: INTRAVENOUS | Status: DC

## 2020-10-22 NOTE — Anesthesia Preprocedure Evaluation (Signed)
Anesthesia Evaluation  Patient identified by MRN, date of birth, ID band Patient awake    Reviewed: Allergy & Precautions, NPO status , Patient's Chart, lab work & pertinent test results  Airway Mallampati: II  TM Distance: >3 FB Neck ROM: Full    Dental  (+) Partial Upper, Poor Dentition   Pulmonary neg pulmonary ROS, Current Smoker and Patient abstained from smoking.,    Pulmonary exam normal        Cardiovascular hypertension, Pt. on medications negative cardio ROS Normal cardiovascular exam     Neuro/Psych PSYCHIATRIC DISORDERS Depression negative neurological ROS     GI/Hepatic Neg liver ROS, GERD  Medicated,  Endo/Other  negative endocrine ROS  Renal/GU negative Renal ROS  negative genitourinary   Musculoskeletal negative musculoskeletal ROS (+)   Abdominal   Peds negative pediatric ROS (+)  Hematology negative hematology ROS (+) anemia ,   Anesthesia Other Findings Cancer ([place])    Depression    Hypercalcemia 03/02/2020  Hyperlipemia    Hypertension    Hypogonadism in male   Multiple myeloma ([place])       Reproductive/Obstetrics negative OB ROS                             Anesthesia Physical Anesthesia Plan  ASA: 3  Anesthesia Plan: General   Post-op Pain Management:    Induction: Intravenous  PONV Risk Score and Plan: 2 and TIVA  Airway Management Planned: Natural Airway and Nasal Cannula  Additional Equipment:   Intra-op Plan:   Post-operative Plan:   Informed Consent: I have reviewed the patients History and Physical, chart, labs and discussed the procedure including the risks, benefits and alternatives for the proposed anesthesia with the patient or authorized representative who has indicated his/her understanding and acceptance.       Plan Discussed with: CRNA, Anesthesiologist and Surgeon  Anesthesia Plan Comments:         Anesthesia Quick  Evaluation

## 2020-10-22 NOTE — Op Note (Signed)
[facility] Patient Name: Tommy Smith Procedure Date: 10/22/2020 10:21 AM MRN: [id number] Account #: [id number] Date of Birth: 28-Sep-1955 Admit Type: Outpatient Age: 65 Room: [facility] ENDO ROOM 3 Gender: Male Note Status: Finalized Procedure:             Upper GI endoscopy Indications:           Abnormal CT of the GI tract Providers:             Jonathon Bellows MD, MD Referring MD:          Sofie Hartigan (Referring MD) Medicines:             Monitored Anesthesia Care Complications:         No immediate complications. Procedure:             Pre-Anesthesia Assessment:                        - Prior to the procedure, a History and Physical was                         performed, and patient medications, allergies and                         sensitivities were reviewed. The patient's tolerance                         of previous anesthesia was reviewed.                        - The risks and benefits of the procedure and the                         sedation options and risks were discussed with the                         patient. All questions were answered and informed                         consent was obtained.                        - ASA Grade Assessment: III - A patient with severe                         systemic disease.                        After obtaining informed consent, the endoscope was                         passed under direct vision. Throughout the procedure,                         the patient's blood pressure, pulse, and oxygen                         saturations were monitored continuously. The Endoscope                         was introduced through  the mouth, and advanced to the                         third part of duodenum. The upper GI endoscopy was                         accomplished with ease. The patient tolerated the                         procedure well. Findings:      The esophagus was normal.      The stomach  was normal.      The examined duodenum was normal. Impression:            - Normal esophagus.                        - Normal stomach.                        - Normal examined duodenum.                        - No specimens collected. Recommendation:        - Discharge patient to home (with escort).                        - Resume previous diet.                        - Continue present medications.                        - Return to my office PRN. Procedure Code(s):     --- Professional ---                        [phone], Esophagogastroduodenoscopy, flexible,                         transoral; diagnostic, including collection of                         specimen(s) by brushing or washing, when performed                         (separate procedure) Diagnosis Code(s):     --- Professional ---                        R93.3, Abnormal findings on diagnostic imaging of                         other parts of digestive tract CPT copyright 2019 American Medical Association. All rights reserved. The codes documented in this report are preliminary and upon coder review may  be revised to meet current compliance requirements. Jonathon Bellows, MD Jonathon Bellows MD, MD 10/22/2020 11:12:27 AM This report has been signed electronically. Number of Addenda: 0 Note Initiated On: 10/22/2020 10:21 AM Estimated Blood Loss:  Estimated blood loss: none.      [facility]

## 2020-10-22 NOTE — Transfer of Care (Signed)
Immediate Anesthesia Transfer of Care Note  Patient: Tommy Smith.  Procedure(s) Performed: ESOPHAGOGASTRODUODENOSCOPY (EGD)  Patient Location: PACU  Anesthesia Type:MAC  Level of Consciousness: awake and sedated  Airway & Oxygen Therapy: Patient Spontanous Breathing and Patient connected to nasal cannula oxygen  Post-op Assessment: Report given to RN and Post -op Vital signs reviewed and stable  Post vital signs: Reviewed and stable  Last Vitals:  Vitals Value Taken Time  BP    Temp    Pulse    Resp    SpO2      Last Pain:  Vitals:   10/22/20 0945  TempSrc: Temporal  PainSc: 0-No pain         Complications: No notable events documented.

## 2020-10-22 NOTE — Anesthesia Postprocedure Evaluation (Signed)
Anesthesia Post Note  Patient: Tommy Smith.  Procedure(s) Performed: ESOPHAGOGASTRODUODENOSCOPY (EGD)  Patient location during evaluation: Phase II Anesthesia Type: General Level of consciousness: awake and alert, awake and oriented Pain management: pain level controlled Vital Signs Assessment: post-procedure vital signs reviewed and stable Respiratory status: spontaneous breathing, nonlabored ventilation and respiratory function stable Cardiovascular status: blood pressure returned to baseline and stable Postop Assessment: no apparent nausea or vomiting Anesthetic complications: no   No notable events documented.   Last Vitals:  Vitals:   10/22/20 1139 10/22/20 1149  BP: (!) 110/91 114/89  Pulse: 72 69  Resp: 17 19  Temp:    SpO2: 97% 98%    Last Pain:  Vitals:   10/22/20 1149  TempSrc:   PainSc: 0-No pain                 Phill Mutter

## 2020-10-22 NOTE — Anesthesia Procedure Notes (Signed)
Date/Time: 10/22/2020 10:23 AM Performed by: Vaughan Sine Pre-anesthesia Checklist: Patient identified, Emergency Drugs available, Suction available, Patient being monitored and Timeout performed Patient Re-evaluated:Patient Re-evaluated prior to induction Oxygen Delivery Method: Nasal cannula Preoxygenation: Pre-oxygenation with 100% oxygen Induction Type: IV induction Placement Confirmation: positive ETCO2 and CO2 detector

## 2020-10-22 NOTE — H&P (Signed)
Jonathon Bellows, MD [address] [place], [place], [place], [address] [address], [place], [place], [place], [address] Phone: [phone]  Fax: [phone]  Primary Care Physician:  Sofie Hartigan, MD   Pre-Procedure History & Physical: HPI:  Tommy Smith. is a 65 y.o. male is here for an endoscopy    Past Medical History:  Diagnosis Date   Cancer [facility])    Depression    Hypercalcemia 03/02/2020   Hyperlipemia    Hypertension    Hypogonadism in male    Multiple myeloma [facility])     Past Surgical History:  Procedure Laterality Date   BONE MARROW BIOPSY     COLONOSCOPY  07/14/2008    Prior to Admission medications   Medication Sig Start Date End Date Taking? Authorizing Provider  acyclovir (ZOVIRAX) 400 MG tablet TAKE 1 TABLET BY MOUTH TWICE A DAY 05/18/20  Yes Earlie Server, MD  ASPIRIN 81 PO Take 81 mg by mouth daily.   Yes [provider]  atorvastatin (LIPITOR) 40 MG tablet Take 40 mg by mouth daily.   Yes [provider]  buPROPion (WELLBUTRIN XL) 150 MG 24 hr tablet Take by mouth. 01/07/20 01/06/21 Yes [provider]  dutasteride (AVODART) 0.5 MG capsule Take 0.5 mg by mouth daily. 11/20/19 11/19/20 Yes [provider]  KLOR-CON M20 20 MEQ tablet TAKE 1 TABLET BY MOUTH TWICE A DAY 10/11/20  Yes Earlie Server, MD  losartan-hydrochlorothiazide [facility]) 100-12.5 MG tablet Take 1 tablet by mouth daily. 01/22/20  Yes [provider]  omeprazole (PRILOSEC) 20 MG capsule Take 1 capsule (20 mg total) by mouth daily. 03/18/20  Yes Earlie Server, MD  polyethylene glycol (MIRALAX) 17 g packet Take 17 g by mouth daily. 03/02/20  Yes Earlie Server, MD  Psyllium (METAMUCIL MULTIHEALTH FIBER PO) Take by mouth daily.   Yes [provider]  senna-docusate (SENNA S) 8.6-50 MG tablet Take 2 tablets by mouth daily. 08/05/20  Yes Earlie Server, MD  amLODipine (NORVASC) 10 MG tablet Take by mouth daily. 05/31/19 10/14/20  [provider]   lenalidomide (REVLIMID) 25 MG capsule Take 1 capsule (25 mg total) by mouth daily. Take 14 days on, 7 days off, repeat every 21 days. Patient not taking: Reported on 10/14/2020 08/18/20   Earlie Server, MD    Allergies as of 10/14/2020   (No Known Allergies)    Family History  Problem Relation Age of Onset   Skin cancer Mother    Prostate cancer Father     Social History   Socioeconomic History   Marital status: Married    Spouse name: Not on file   Number of children: Not on file   Years of education: Not on file   Highest education level: Not on file  Occupational History   Not on file  Tobacco Use   Smoking status: Every Day    Packs/day: 1.00    Years: 30.00    Pack years: 30.00    Types: Cigarettes   Smokeless tobacco: Never  Substance and Sexual Activity   Alcohol use: Yes    Comment: occcassional   Drug use: No   Sexual activity: Not on file  Other Topics Concern   Not on file  Social History Narrative   Not on file   Social Determinants of Health   Financial Resource Strain: Not on file  Food Insecurity: Not on file  Transportation Needs: Not on file  Physical Activity: Not on file  Stress: Not on file  Social Connections: Not on file  Intimate Partner Violence: Not on file    Review of Systems: See HPI, otherwise negative ROS  Physical Exam: BP 124/84   Pulse 78   Temp (!) 96.3 F (35.7 C) (Temporal)   Resp 18   Ht _0  (1.905 m)   Wt 95.7 kg   SpO2 98%   BMI 26.37 kg/m  General:   Alert,  pleasant and cooperative in NAD Head:  Normocephalic and atraumatic. Neck:  Supple; no masses or thyromegaly. Lungs:  Clear throughout to auscultation, normal respiratory effort.    Heart:  +S1, +S2, Regular rate and rhythm, No edema. Abdomen:  Soft, nontender and nondistended. Normal bowel sounds, without guarding, and without rebound.   Neurologic:  Alert and  oriented x4;  grossly normal neurologically.  Impression/Plan: Tommy Smith. is here  for an endoscopy  to be performed for  evaluation of abnormal ct scan     Risks, benefits, limitations, and alternatives regarding endoscopy have been reviewed with the patient.  Questions have been answered.  All parties agreeable.   Jonathon Bellows, MD  10/22/2020, 10:14 AM

## 2020-10-23 ENCOUNTER — Encounter: Payer: Self-pay | Admitting: Gastroenterology

## 2020-10-26 ENCOUNTER — Other Ambulatory Visit: Payer: Self-pay | Admitting: Oncology

## 2020-10-27 NOTE — Telephone Encounter (Signed)
pts last k+ level was 3.9 on 5/24. Please advise on refill.

## 2020-10-28 ENCOUNTER — Encounter: Payer: Self-pay | Admitting: Oncology

## 2020-10-28 ENCOUNTER — Other Ambulatory Visit: Payer: Self-pay

## 2020-10-28 DIAGNOSIS — C9000 Multiple myeloma not having achieved remission: Secondary | ICD-10-CM

## 2020-11-02 NOTE — Telephone Encounter (Signed)
Please schedule patient for labs on TUES or THURS morning this week and notify pt of appt. Thanks

## 2020-11-03 ENCOUNTER — Inpatient Hospital Stay: Payer: Medicare Other | Attending: Oncology

## 2020-11-03 DIAGNOSIS — C9000 Multiple myeloma not having achieved remission: Secondary | ICD-10-CM | POA: Diagnosis present

## 2020-11-03 LAB — COMPREHENSIVE METABOLIC PANEL
ALT: 28 U/L (ref 0–44)
AST: 21 U/L (ref 15–41)
Albumin: 4 g/dL (ref 3.5–5.0)
Alkaline Phosphatase: 55 U/L (ref 38–126)
Anion gap: 10 (ref 5–15)
BUN: 12 mg/dL (ref 8–23)
CO2: 27 mmol/L (ref 22–32)
Calcium: 9.6 mg/dL (ref 8.9–10.3)
Chloride: 102 mmol/L (ref 98–111)
Creatinine, Ser: 0.8 mg/dL (ref 0.61–1.24)
GFR, Estimated: >60 mL/min (ref >60)
Glucose, Bld: 86 mg/dL (ref 70–99)
Potassium: 3.3 mmol/L — ABNORMAL LOW (ref 3.5–5.1)
Sodium: 139 mmol/L (ref 135–145)
Total Bilirubin: 0.9 mg/dL (ref 0.3–1.2)
Total Protein: 6.8 g/dL (ref 6.5–8.1)

## 2020-11-03 LAB — CBC WITH DIFFERENTIAL/PLATELET
Abs Immature Granulocytes: 0.02 10*3/uL (ref 0.00–0.07)
Basophils Absolute: 0 10*3/uL (ref 0.0–0.1)
Basophils Relative: 0 %
Eosinophils Absolute: 0.1 10*3/uL (ref 0.0–0.5)
Eosinophils Relative: 3 %
HCT: 42.1 % (ref 39.0–52.0)
Hemoglobin: 13.6 g/dL (ref 13.0–17.0)
Immature Granulocytes: 0 %
Lymphocytes Relative: 25 %
Lymphs Abs: 1.2 10*3/uL (ref 0.7–4.0)
MCH: 28.8 pg (ref 26.0–34.0)
MCHC: 32.3 g/dL (ref 30.0–36.0)
MCV: 89.2 fL (ref 80.0–100.0)
Monocytes Absolute: 0.5 10*3/uL (ref 0.1–1.0)
Monocytes Relative: 10 %
Neutro Abs: 2.9 10*3/uL (ref 1.7–7.7)
Neutrophils Relative %: 62 %
Platelets: 176 10*3/uL (ref 150–400)
RBC: 4.72 MIL/uL (ref 4.22–5.81)
RDW: 14.4 % (ref 11.5–15.5)
WBC: 4.7 10*3/uL (ref 4.0–10.5)
nRBC: 0 % (ref 0.0–0.2)

## 2020-11-04 LAB — KAPPA/LAMBDA LIGHT CHAINS
Kappa free light chain: 14.8 mg/L (ref 3.3–19.4)
Kappa, lambda light chain ratio: 3.02 — ABNORMAL HIGH (ref 0.26–1.65)
Lambda free light chains: 4.9 mg/L — ABNORMAL LOW (ref 5.7–26.3)

## 2020-11-05 ENCOUNTER — Telehealth: Payer: Self-pay

## 2020-11-05 NOTE — Telephone Encounter (Signed)
-----   Message from Earlie Server, MD sent at 11/05/2020  1:32 PM EDT ----- When is he going to see [facility]? Please recommend him to consider doing additional chemo while waiting for [facility] evaluation. Her blood work is getting worse.

## 2020-11-05 NOTE — Telephone Encounter (Signed)
Left detailed VM on pts phone and sent Mychart message as well. Will wait on response.

## 2020-11-06 ENCOUNTER — Other Ambulatory Visit: Payer: Self-pay

## 2020-11-06 DIAGNOSIS — C9000 Multiple myeloma not having achieved remission: Secondary | ICD-10-CM

## 2020-11-06 LAB — MULTIPLE MYELOMA PANEL, SERUM
Albumin SerPl Elph-Mcnc: 3.9 g/dL (ref 2.9–4.4)
Albumin/Glob SerPl: 1.6 (ref 0.7–1.7)
Alpha 1: 0.2 g/dL (ref 0.0–0.4)
Alpha2 Glob SerPl Elph-Mcnc: 0.8 g/dL (ref 0.4–1.0)
B-Globulin SerPl Elph-Mcnc: 1.3 g/dL (ref 0.7–1.3)
Gamma Glob SerPl Elph-Mcnc: 0.3 g/dL — ABNORMAL LOW (ref 0.4–1.8)
Globulin, Total: 2.6 g/dL (ref 2.2–3.9)
IgA: 89 mg/dL (ref 61–437)
IgG (Immunoglobin G), Serum: 828 mg/dL (ref 603–1613)
IgM (Immunoglobulin M), Srm: 15 mg/dL — ABNORMAL LOW (ref 20–172)
M Protein SerPl Elph-Mcnc: 0.6 g/dL — ABNORMAL HIGH
Total Protein ELP: 6.5 g/dL (ref 6.0–8.5)

## 2020-11-06 NOTE — Telephone Encounter (Signed)
Should he start taking Revlimid again. I believe he is asking if he needs to come in for infusions, and just take revlimid. Please advise.

## 2020-11-06 NOTE — Telephone Encounter (Signed)
11/06/2020 Spoke w/ pt and informed him of appts scheduled for 8/4 @ 1:45 for lab/MD/velcade @ [facility]. Pt confirmed appt  SRW

## 2020-11-06 NOTE — Telephone Encounter (Signed)
Patient replied to [place] message and is agreeable with scheduling appt. Please schedule patient for lab/MD/ Velcade in [place] on 8/4. Please contact pt with appt details. thanks

## 2020-11-10 ENCOUNTER — Encounter: Payer: Self-pay | Admitting: Oncology

## 2020-11-11 ENCOUNTER — Other Ambulatory Visit: Payer: Self-pay | Admitting: Oncology

## 2020-11-11 DIAGNOSIS — C9000 Multiple myeloma not having achieved remission: Secondary | ICD-10-CM

## 2020-11-12 ENCOUNTER — Encounter: Payer: Self-pay | Admitting: Oncology

## 2020-11-12 ENCOUNTER — Inpatient Hospital Stay: Payer: Medicare Other | Attending: Oncology

## 2020-11-12 ENCOUNTER — Other Ambulatory Visit: Payer: Self-pay

## 2020-11-12 ENCOUNTER — Inpatient Hospital Stay: Payer: Medicare Other | Admitting: Oncology

## 2020-11-12 ENCOUNTER — Inpatient Hospital Stay: Payer: Medicare Other

## 2020-11-12 VITALS — BP 107/73 | HR 84 | Temp 98.7°F | Resp 18 | Wt 210.2 lb

## 2020-11-12 DIAGNOSIS — Z808 Family history of malignant neoplasm of other organs or systems: Secondary | ICD-10-CM | POA: Insufficient documentation

## 2020-11-12 DIAGNOSIS — C9000 Multiple myeloma not having achieved remission: Secondary | ICD-10-CM | POA: Insufficient documentation

## 2020-11-12 DIAGNOSIS — M899 Disorder of bone, unspecified: Secondary | ICD-10-CM | POA: Diagnosis not present

## 2020-11-12 DIAGNOSIS — Z7982 Long term (current) use of aspirin: Secondary | ICD-10-CM | POA: Insufficient documentation

## 2020-11-12 DIAGNOSIS — R918 Other nonspecific abnormal finding of lung field: Secondary | ICD-10-CM | POA: Diagnosis not present

## 2020-11-12 DIAGNOSIS — E291 Testicular hypofunction: Secondary | ICD-10-CM | POA: Diagnosis not present

## 2020-11-12 DIAGNOSIS — E785 Hyperlipidemia, unspecified: Secondary | ICD-10-CM | POA: Diagnosis not present

## 2020-11-12 DIAGNOSIS — Z79899 Other long term (current) drug therapy: Secondary | ICD-10-CM | POA: Insufficient documentation

## 2020-11-12 DIAGNOSIS — E876 Hypokalemia: Secondary | ICD-10-CM | POA: Diagnosis not present

## 2020-11-12 DIAGNOSIS — F17210 Nicotine dependence, cigarettes, uncomplicated: Secondary | ICD-10-CM | POA: Insufficient documentation

## 2020-11-12 DIAGNOSIS — D649 Anemia, unspecified: Secondary | ICD-10-CM | POA: Insufficient documentation

## 2020-11-12 DIAGNOSIS — M8448XA Pathological fracture, other site, initial encounter for fracture: Secondary | ICD-10-CM | POA: Insufficient documentation

## 2020-11-12 DIAGNOSIS — I10 Essential (primary) hypertension: Secondary | ICD-10-CM | POA: Diagnosis not present

## 2020-11-12 DIAGNOSIS — M545 Low back pain, unspecified: Secondary | ICD-10-CM | POA: Diagnosis not present

## 2020-11-12 DIAGNOSIS — I700 Atherosclerosis of aorta: Secondary | ICD-10-CM | POA: Insufficient documentation

## 2020-11-12 DIAGNOSIS — Z5111 Encounter for antineoplastic chemotherapy: Secondary | ICD-10-CM

## 2020-11-12 DIAGNOSIS — Z8042 Family history of malignant neoplasm of prostate: Secondary | ICD-10-CM | POA: Diagnosis not present

## 2020-11-12 LAB — CBC WITH DIFFERENTIAL/PLATELET
Abs Immature Granulocytes: 0.01 10*3/uL (ref 0.00–0.07)
Basophils Absolute: 0 10*3/uL (ref 0.0–0.1)
Basophils Relative: 0 %
Eosinophils Absolute: 0.2 10*3/uL (ref 0.0–0.5)
Eosinophils Relative: 3 %
HCT: 41.7 % (ref 39.0–52.0)
Hemoglobin: 14.1 g/dL (ref 13.0–17.0)
Immature Granulocytes: 0 %
Lymphocytes Relative: 27 %
Lymphs Abs: 1.3 10*3/uL (ref 0.7–4.0)
MCH: 29.2 pg (ref 26.0–34.0)
MCHC: 33.8 g/dL (ref 30.0–36.0)
MCV: 86.3 fL (ref 80.0–100.0)
Monocytes Absolute: 0.6 10*3/uL (ref 0.1–1.0)
Monocytes Relative: 12 %
Neutro Abs: 2.7 10*3/uL (ref 1.7–7.7)
Neutrophils Relative %: 58 %
Platelets: 201 10*3/uL (ref 150–400)
RBC: 4.83 MIL/uL (ref 4.22–5.81)
RDW: 14.6 % (ref 11.5–15.5)
WBC: 4.7 10*3/uL (ref 4.0–10.5)
nRBC: 0 % (ref 0.0–0.2)

## 2020-11-12 LAB — COMPREHENSIVE METABOLIC PANEL
ALT: 20 U/L (ref 0–44)
AST: 17 U/L (ref 15–41)
Albumin: 4.2 g/dL (ref 3.5–5.0)
Alkaline Phosphatase: 60 U/L (ref 38–126)
Anion gap: 7 (ref 5–15)
BUN: 12 mg/dL (ref 8–23)
CO2: 27 mmol/L (ref 22–32)
Calcium: 9.5 mg/dL (ref 8.9–10.3)
Chloride: 102 mmol/L (ref 98–111)
Creatinine, Ser: 0.81 mg/dL (ref 0.61–1.24)
GFR, Estimated: >60 mL/min (ref >60)
Glucose, Bld: 80 mg/dL (ref 70–99)
Potassium: 3.6 mmol/L (ref 3.5–5.1)
Sodium: 136 mmol/L (ref 135–145)
Total Bilirubin: 1.4 mg/dL — ABNORMAL HIGH (ref 0.3–1.2)
Total Protein: 7.1 g/dL (ref 6.5–8.1)

## 2020-11-13 ENCOUNTER — Encounter: Payer: Self-pay | Admitting: Oncology

## 2020-11-13 ENCOUNTER — Telehealth: Payer: Self-pay

## 2020-11-13 NOTE — Telephone Encounter (Signed)
Nutrition  Patient identified on Malnutrition Screening report for weight loss.   Called patient to introduce self and service at [facility].  No answer.  Left message with callback number  Maysen Sudol B. Zenia Resides, [place], [place] Registered Dietitian [phone] (mobile)

## 2020-11-13 NOTE — Progress Notes (Signed)
Hematology/Oncology follow up note [facility] [phone] [phone]   Patient Care Team: Sofie Hartigan, MD as PCP - General (Family Medicine) Noreene Filbert, MD as Radiation Oncologist (Radiation Oncology)  REFERRING PROVIDER: Sofie Hartigan, MD  CHIEF COMPLAINTS/REASON FOR VISIT:  Follow-up for multiple myeloma HISTORY OF PRESENTING ILLNESS:   Tommy Smith. is a  65 y.o.  male with PMH listed below was seen in consultation at the request of  Sofie Hartigan, MD  for evaluation of abnormal serum protein electrophoresis.  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  Increase calcium level at 12.3, PTH was normal.  Creatinine 1, estimated GFR 91, CBC showed hemoglobin 12.4, Patient also recently had a CT chest abdomen pelvis with contrast for evaluation of right flank pain and right lower lobe nodule. 01/29/2020, CT showed multiple bilateral pulmonary nodules measuring up to 10 mm in the right middle lobe, nodules are indeterminate, infectious/inflammatory etiology versus metastatic disease. Multiple osseous lytic lesions, largest lesion involving the left pubic bone concerning for metastatic disease. Compression fractures at T9, age indeterminate. Appearance focal area of thickening at the gastroesophageal junction.  Further evaluation with upper GI study or direct visualization with endoscopy is recommended No bowel obstruction. Aortic atherosclerosis.  Patient reports NSAIDs as needed for lower back pain.  Mid lower back pain usually is worse when when he stands up from sitting position.  Patient works in the [employer].  Lives at home with wife.  Denies any unintentional weight loss, fever, chills, night sweating  # IgG multiple myeloma, stage II, del 1 p, high risk Baseline M protein 4.1, kappa light chain level 333.8 Beta-2 microglobulin 2.1, albumin 2.7. 02/12/2020, bone marrow  biopsy showed hypercellular for age, 80% plasma cell which are complex restricted by light chain in situ heparinization.  Background hematopoiesis is present but reduced. Cytogenetics is normal, Myeloma FISH panel-deletion 1P,Standard risk. He is not interested in  # 02/27/2020 patient has been started on Revlimid 25 mg D1-14 #02/24/2020- 08/26/2020  Velcade weekly with dexamethasone 20 mg weekly  # 08/14/2020 M protein 0.5, close to 90% reduction from original M protein of 4.1, light chain ration 1.76 # 08/18/2020  Bone marrow results were reviewed.  1% plasma cell.  Normal cytogenetics.  Myeloma FISH negative for 1p del. #09/01/2020, patient agreed for second opinion at [facility] for evaluation and discussion of possible bone marrow transplant. He prefers to hold off maintenance treatment until his [place] appointment.   #Focal area of thickening at the GE junction. 10/23/2020 EGD is normal.  #Lung nodules, measures up to 10 mm on the right middle lobe.  Indeterminate.  Attention on follow-up.  Recommend repeat CT chest and he declined   INTERVAL HISTORY Tommy Smith. is a 65 y.o. male who has above history reviewed by me today presents for follow up visit for multiple myeloma/evaluation discussion bone marrow results.. Problems and complaints are listed below: Patient reports feeling well. No new complaints.  Patient has an appointment with [facility] oncology multiple myeloma team at the end of August 2022.  Review of Systems  Constitutional:  Negative for appetite change, chills, fatigue, fever and unexpected weight change.  HENT:   Negative for hearing loss and voice change.   Eyes:  Negative for eye problems and icterus.  Respiratory:  Negative for chest tightness, cough and shortness of breath.   Cardiovascular:  Negative for chest pain and leg swelling.  Gastrointestinal:  Negative for abdominal distention, abdominal pain  and constipation.  Endocrine: Negative for hot flashes.  Genitourinary:   Negative for difficulty urinating, dysuria and frequency.   Musculoskeletal:  Negative for arthralgias, back pain and neck pain.  Skin:  Negative for itching and rash.  Neurological:  Negative for light-headedness and numbness.  Hematological:  Negative for adenopathy. Does not bruise/bleed easily.  Psychiatric/Behavioral:  Negative for confusion.     MEDICAL HISTORY:  Past Medical History:  Diagnosis Date   Cancer ([place])    Depression    Hypercalcemia 03/02/2020   Hyperlipemia    Hypertension    Hypogonadism in male    Multiple myeloma [facility])     SURGICAL HISTORY: Past Surgical History:  Procedure Laterality Date   BONE MARROW BIOPSY     COLONOSCOPY  07/14/2008   ESOPHAGOGASTRODUODENOSCOPY N/A 10/22/2020   Procedure: ESOPHAGOGASTRODUODENOSCOPY (EGD);  Surgeon: Jonathon Bellows, MD;  Location: [facility]COPY;  Service: Gastroenterology;  Laterality: N/A;    SOCIAL HISTORY: Social History   Socioeconomic History   Marital status: Married    Spouse name: Not on file   Number of children: Not on file   Years of education: Not on file   Highest education level: Not on file  Occupational History   Not on file  Tobacco Use   Smoking status: Every Day    Packs/day: 1.00    Years: 30.00    Pack years: 30.00    Types: Cigarettes   Smokeless tobacco: Never  Substance and Sexual Activity   Alcohol use: Yes    Comment: occcassional   Drug use: No   Sexual activity: Not on file  Other Topics Concern   Not on file  Social History Narrative   Not on file   Social Determinants of Health   Financial Resource Strain: Not on file  Food Insecurity: Not on file  Transportation Needs: Not on file  Physical Activity: Not on file  Stress: Not on file  Social Connections: Not on file  Intimate Partner Violence: Not on file    FAMILY HISTORY: Family History  Problem Relation Age of Onset   Skin cancer Mother    Prostate cancer Father     ALLERGIES:  has No Known  Allergies.  MEDICATIONS:  Current Outpatient Medications  Medication Sig Dispense Refill   amLODipine (NORVASC) 10 MG tablet Take by mouth daily.     ASPIRIN 81 PO Take 81 mg by mouth daily.     atorvastatin (LIPITOR) 40 MG tablet Take 40 mg by mouth daily.     buPROPion (WELLBUTRIN XL) 150 MG 24 hr tablet Take by mouth.     buPROPion (WELLBUTRIN XL) 300 MG 24 hr tablet Take 300 mg by mouth daily.     dutasteride (AVODART) 0.5 MG capsule Take 0.5 mg by mouth daily.     KLOR-CON M20 20 MEQ tablet TAKE 1 TABLET BY MOUTH TWICE A DAY 180 tablet 1   losartan-hydrochlorothiazide (HYZAAR) 100-12.5 MG tablet Take 1 tablet by mouth daily.     omeprazole (PRILOSEC) 20 MG capsule Take 1 capsule (20 mg total) by mouth daily. 30 capsule 1   polyethylene glycol (MIRALAX) 17 g packet Take 17 g by mouth daily. 14 each 0   Psyllium (METAMUCIL MULTIHEALTH FIBER PO) Take by mouth daily.     senna-docusate (SENNA S) 8.6-50 MG tablet Take 2 tablets by mouth daily. 60 tablet 3   acyclovir (ZOVIRAX) 400 MG tablet TAKE 1 TABLET BY MOUTH TWICE A DAY 180 tablet 1   lenalidomide (REVLIMID)  25 MG capsule Take 1 capsule (25 mg total) by mouth daily. Take 14 days on, 7 days off, repeat every 21 days. (Patient not taking: No sig reported) 14 capsule 0   No current facility-administered medications for this visit.     PHYSICAL EXAMINATION: ECOG PERFORMANCE STATUS: 1 - Symptomatic but completely ambulatory Vitals:   11/12/20 1339  BP: 107/73  Pulse: 84  Resp: 18  Temp: 98.7 F (37.1 C)  SpO2: 98%   Filed Weights   11/12/20 1339  Weight: 210 lb 3.3 oz (95.4 kg)    Physical Exam Constitutional:      General: He is not in acute distress.    Appearance: He is not diaphoretic.  HENT:     Head: Normocephalic and atraumatic.     Nose: Nose normal.     Mouth/Throat:     Pharynx: No oropharyngeal exudate.  Eyes:     General: No scleral icterus.    Pupils: Pupils are equal, round, and reactive to light.   Cardiovascular:     Rate and Rhythm: Normal rate and regular rhythm.     Heart sounds: No murmur heard. Pulmonary:     Effort: Pulmonary effort is normal. No respiratory distress.     Breath sounds: No rales.  Chest:     Chest wall: No tenderness.  Abdominal:     General: There is no distension.     Palpations: Abdomen is soft.     Tenderness: There is no abdominal tenderness.  Musculoskeletal:        General: Normal range of motion.     Cervical back: Normal range of motion and neck supple.  Skin:    General: Skin is warm and dry.     Findings: No erythema.  Neurological:     Mental Status: He is alert and oriented to person, place, and time.     Cranial Nerves: No cranial nerve deficit.     Motor: No abnormal muscle tone.     Coordination: Coordination normal.  Psychiatric:        Mood and Affect: Affect normal.      LABORATORY DATA:  I have reviewed the data as listed Lab Results  Component Value Date   WBC 4.7 11/12/2020   HGB 14.1 11/12/2020   HCT 41.7 11/12/2020   MCV 86.3 11/12/2020   PLT 201 11/12/2020   Recent Labs    09/01/20 1408 11/03/20 0940 11/12/20 1327  NA 140 139 136  K 3.9 3.3* 3.6  CL 103 102 102  CO2 27 27 27   GLUCOSE 90 86 80  BUN 15 12 12   CREATININE 0.86 0.80 0.81  CALCIUM 9.4 9.6 9.5  GFRNONAA >60 >60 >60  PROT 6.7 6.8 7.1  ALBUMIN 3.8 4.0 4.2  AST 15 21 17   ALT 24 28 20   ALKPHOS 49 55 60  BILITOT 1.2 0.9 1.4*    Iron/TIBC/Ferritin/ %Sat No results found for: IRON, TIBC, FERRITIN, IRONPCTSAT    RADIOGRAPHIC STUDIES: I have personally reviewed the radiological images as listed and agreed with the findings in the report. CT BONE MARROW BIOPSY & ASPIRATION  Result Date: 08/18/2020 INDICATION: Multiple myeloma, restaging exam EXAM: CT GUIDED RIGHT ILIAC BONE MARROW ASPIRATION AND CORE BIOPSY Date:  08/18/2020 08/18/2020 9:02 am Radiologist:  M. Daryll Brod, MD Guidance:  CT FLUOROSCOPY TIME:  Fluoroscopy Time: None.  MEDICATIONS: 1% lidocaine local ANESTHESIA/SEDATION: 2.0 mg IV Versed; 100 mcg IV Fentanyl Moderate Sedation Time:  14 minutes The patient was continuously  monitored during the procedure by the interventional radiology nurse under my direct supervision. CONTRAST:  None. COMPLICATIONS: None PROCEDURE: Informed consent was obtained from the patient following explanation of the procedure, risks, benefits and alternatives. The patient understands, agrees and consents for the procedure. All questions were addressed. A time out was performed. The patient was positioned prone and non-contrast localization CT was performed of the pelvis to demonstrate the iliac marrow spaces. Maximal barrier sterile technique utilized including caps, mask, sterile gowns, sterile gloves, large sterile drape, hand hygiene, and Betadine prep. Under sterile conditions and local anesthesia, an 11 gauge coaxial bone biopsy needle was advanced into the right iliac marrow space. Needle position was confirmed with CT imaging. Initially, bone marrow aspiration was performed. Next, the 11 gauge outer cannula was utilized to obtain a right iliac bone marrow core biopsy. Needle was removed. Hemostasis was obtained with compression. The patient tolerated the procedure well. Samples were prepared with the cytotechnologist. No immediate complications. IMPRESSION: CT guided right iliac bone marrow aspiration and core biopsy. Electronically Signed   By: Jerilynn Mages.  Shick M.D.   On: 08/18/2020 10:00       ASSESSMENT & PLAN:  1. Multiple myeloma not having achieved remission ([place])   2. Encounter for antineoplastic chemotherapy   3. Hypokalemia   4. Hypercalcemia   5. Lytic bone lesions on xray   Cancer Staging Multiple myeloma not having achieved remission ([place]) Staging form: Plasma Cell Myeloma and Plasma Cell Disorders, AJCC 8th Edition - Clinical stage from 02/04/2020: Beta-2-microglobulin (mg/L): 2.1, Albumin (g/dL): 2.7, ISS: Stage II, High-risk  cytogenetics: Absent, LDH: Unknown - Signed by Earlie Server, MD on 05/06/2020   #IgG multiple myeloma, stage II, del 1 p, high risk.  Declined bone marrow transplant evaluation. 1st treatment with RVD, status post 8 cycles. Labs are reviewed and discussed with patient. 11/03/2020, M protein 0.6, lambda free light chain 4.9, kappa light chain 14.8, kappa lambda light chain ratio 3.02. Discussed about proceed with 1 cycle of Revlimid 25 mg 2 weeks on 1 week off while waiting for [facility] second opinion.  He agrees with the plan. If he does not want to proceed with bone marrow transplant, consider maintenance with Velcade or Revlimid Continue  aspirin 81 mg daily for DVT prophylaxis. Continue acyclovir 400 mg twice daily.  #Bone lesion, Last dose 08/07/2020. Marland Kitchen  #Hypocalcemia, mild.  recommend calcium 1200 mg daily. 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  Hemoglobin stable. # hypokalemia, potassium stable at 3.5.  Continue potassium chloride 20 mEq daily.  We spent sufficient time to discuss many aspect of care, questions were answered to patient's satisfaction. All questions were answered. The patient knows to call the clinic with any problems questions or concerns.  cc Sofie Hartigan, MD   Return of visit:  To be determined.   Earlie Server, MD, PhD Hematology Oncology [facility] Pager- [phone] 11/13/2020

## 2020-11-26 ENCOUNTER — Telehealth: Payer: Self-pay | Admitting: *Deleted

## 2020-11-26 ENCOUNTER — Other Ambulatory Visit: Payer: Self-pay

## 2020-11-26 DIAGNOSIS — C9000 Multiple myeloma not having achieved remission: Secondary | ICD-10-CM

## 2020-11-26 NOTE — Telephone Encounter (Signed)
Unable to obtain REMS # due to pt no having completed pt survey. I have sent him a mychart message to notify him to do his part.

## 2020-11-26 NOTE — Telephone Encounter (Signed)
Requesting refill for Revlimid unclear from last note whether he was continuing with this medication.

## 2020-11-27 ENCOUNTER — Encounter: Payer: Self-pay | Admitting: Oncology

## 2020-11-27 ENCOUNTER — Other Ambulatory Visit: Payer: Self-pay

## 2020-11-27 DIAGNOSIS — C9000 Multiple myeloma not having achieved remission: Secondary | ICD-10-CM

## 2020-11-27 MED ORDER — LENALIDOMIDE 25 MG PO CAPS: 25.0000 mg | ORAL_CAPSULE | Freq: Every day | ORAL | 0 refills | Status: DC

## 2020-12-02 ENCOUNTER — Encounter: Payer: Self-pay | Admitting: Oncology

## 2020-12-02 MED ORDER — LENALIDOMIDE 25 MG PO CAPS: 25.0000 mg | ORAL_CAPSULE | Freq: Every day | ORAL | 0 refills | Status: DC

## 2020-12-03 ENCOUNTER — Encounter: Payer: Self-pay | Admitting: Oncology

## 2020-12-08 ENCOUNTER — Other Ambulatory Visit: Payer: Self-pay | Admitting: Pharmacist

## 2020-12-08 DIAGNOSIS — C9000 Multiple myeloma not having achieved remission: Secondary | ICD-10-CM

## 2020-12-08 MED ORDER — LENALIDOMIDE 25 MG PO CAPS: 25.0000 mg | ORAL_CAPSULE | Freq: Every day | ORAL | 0 refills | Status: DC

## 2020-12-10 NOTE — Telephone Encounter (Signed)
Please advise, looks like he was seen by Dr. Alvie Heidelberg at [facility].

## 2020-12-11 NOTE — Telephone Encounter (Signed)
Please advise regarding dental question

## 2020-12-16 ENCOUNTER — Encounter: Payer: Self-pay | Admitting: Oncology

## 2020-12-16 ENCOUNTER — Telehealth: Payer: Self-pay

## 2020-12-16 NOTE — Telephone Encounter (Signed)
Please advise. Notes scanned in Media.

## 2020-12-16 NOTE — Telephone Encounter (Signed)
Dr. Tasia Catchings has reviewed Dr. Kendell Bane note from [facility] and it is recommended for patient to resume on RVD regimen. Will send refill for Revlimid, but pt will hold until he see's MD. Mychart message sent to patient notifying him of this recommendation.

## 2020-12-17 NOTE — Telephone Encounter (Signed)
Sent pt Mychart message as well, to contact our office and get appt scheduled.

## 2020-12-18 ENCOUNTER — Encounter: Payer: Self-pay | Admitting: Oncology

## 2020-12-23 ENCOUNTER — Inpatient Hospital Stay: Payer: Medicare Other

## 2020-12-23 ENCOUNTER — Encounter: Payer: Self-pay | Admitting: Oncology

## 2020-12-23 ENCOUNTER — Inpatient Hospital Stay (HOSPITAL_BASED_OUTPATIENT_CLINIC_OR_DEPARTMENT_OTHER): Payer: Medicare Other | Admitting: Oncology

## 2020-12-23 ENCOUNTER — Inpatient Hospital Stay: Payer: Medicare Other | Attending: Oncology

## 2020-12-23 ENCOUNTER — Other Ambulatory Visit: Payer: Self-pay

## 2020-12-23 VITALS — BP 109/81 | HR 74 | Temp 96.1°F | Resp 16 | Wt 208.0 lb

## 2020-12-23 DIAGNOSIS — Z5111 Encounter for antineoplastic chemotherapy: Secondary | ICD-10-CM | POA: Insufficient documentation

## 2020-12-23 DIAGNOSIS — Z8042 Family history of malignant neoplasm of prostate: Secondary | ICD-10-CM | POA: Diagnosis not present

## 2020-12-23 DIAGNOSIS — Z7982 Long term (current) use of aspirin: Secondary | ICD-10-CM | POA: Insufficient documentation

## 2020-12-23 DIAGNOSIS — Z79899 Other long term (current) drug therapy: Secondary | ICD-10-CM | POA: Diagnosis not present

## 2020-12-23 DIAGNOSIS — E876 Hypokalemia: Secondary | ICD-10-CM | POA: Insufficient documentation

## 2020-12-23 DIAGNOSIS — M899 Disorder of bone, unspecified: Secondary | ICD-10-CM

## 2020-12-23 DIAGNOSIS — M545 Low back pain, unspecified: Secondary | ICD-10-CM | POA: Diagnosis not present

## 2020-12-23 DIAGNOSIS — R911 Solitary pulmonary nodule: Secondary | ICD-10-CM

## 2020-12-23 DIAGNOSIS — I700 Atherosclerosis of aorta: Secondary | ICD-10-CM | POA: Diagnosis not present

## 2020-12-23 DIAGNOSIS — I10 Essential (primary) hypertension: Secondary | ICD-10-CM | POA: Insufficient documentation

## 2020-12-23 DIAGNOSIS — F17210 Nicotine dependence, cigarettes, uncomplicated: Secondary | ICD-10-CM | POA: Diagnosis not present

## 2020-12-23 DIAGNOSIS — Z8781 Personal history of (healed) traumatic fracture: Secondary | ICD-10-CM | POA: Insufficient documentation

## 2020-12-23 DIAGNOSIS — E785 Hyperlipidemia, unspecified: Secondary | ICD-10-CM | POA: Insufficient documentation

## 2020-12-23 DIAGNOSIS — C9000 Multiple myeloma not having achieved remission: Secondary | ICD-10-CM

## 2020-12-23 DIAGNOSIS — Z808 Family history of malignant neoplasm of other organs or systems: Secondary | ICD-10-CM | POA: Insufficient documentation

## 2020-12-23 LAB — CBC WITH DIFFERENTIAL/PLATELET
Abs Immature Granulocytes: 0.01 10*3/uL (ref 0.00–0.07)
Basophils Absolute: 0 10*3/uL (ref 0.0–0.1)
Basophils Relative: 0 %
Eosinophils Absolute: 0.3 10*3/uL (ref 0.0–0.5)
Eosinophils Relative: 5 %
HCT: 42.6 % (ref 39.0–52.0)
Hemoglobin: 14 g/dL (ref 13.0–17.0)
Immature Granulocytes: 0 %
Lymphocytes Relative: 22 %
Lymphs Abs: 1.1 10*3/uL (ref 0.7–4.0)
MCH: 28.6 pg (ref 26.0–34.0)
MCHC: 32.9 g/dL (ref 30.0–36.0)
MCV: 86.9 fL (ref 80.0–100.0)
Monocytes Absolute: 0.5 10*3/uL (ref 0.1–1.0)
Monocytes Relative: 11 %
Neutro Abs: 3.1 10*3/uL (ref 1.7–7.7)
Neutrophils Relative %: 62 %
Platelets: 231 10*3/uL (ref 150–400)
RBC: 4.9 MIL/uL (ref 4.22–5.81)
RDW: 14.7 % (ref 11.5–15.5)
WBC: 5 10*3/uL (ref 4.0–10.5)
nRBC: 0 % (ref 0.0–0.2)

## 2020-12-23 LAB — COMPREHENSIVE METABOLIC PANEL
ALT: 61 U/L — ABNORMAL HIGH (ref 0–44)
AST: 35 U/L (ref 15–41)
Albumin: 3.8 g/dL (ref 3.5–5.0)
Alkaline Phosphatase: 56 U/L (ref 38–126)
Anion gap: 5 (ref 5–15)
BUN: 14 mg/dL (ref 8–23)
CO2: 28 mmol/L (ref 22–32)
Calcium: 9 mg/dL (ref 8.9–10.3)
Chloride: 105 mmol/L (ref 98–111)
Creatinine, Ser: 0.82 mg/dL (ref 0.61–1.24)
GFR, Estimated: >60 mL/min (ref >60)
Glucose, Bld: 76 mg/dL (ref 70–99)
Potassium: 3.5 mmol/L (ref 3.5–5.1)
Sodium: 138 mmol/L (ref 135–145)
Total Bilirubin: 0.8 mg/dL (ref 0.3–1.2)
Total Protein: 6.8 g/dL (ref 6.5–8.1)

## 2020-12-23 MED ORDER — BORTEZOMIB CHEMO SQ INJECTION 3.5 MG (2.5MG/ML)
1.3000 mg/m2 | Freq: Once | INTRAMUSCULAR | Status: AC
  Administered 2020-12-23: 3 mg via SUBCUTANEOUS
  Filled 2020-12-23: qty 1.2

## 2020-12-23 MED ORDER — DEXAMETHASONE 4 MG PO TABS
20.0000 mg | ORAL_TABLET | Freq: Once | ORAL | Status: AC
  Administered 2020-12-23: 20 mg via ORAL
  Filled 2020-12-23: qty 5

## 2020-12-23 NOTE — Patient Instructions (Signed)
[facility]  Discharge Instructions: Thank you for choosing [facility] to provide your oncology and hematology care.  If you have a lab appointment with the [facility], please go directly to the [facility] and check in at the registration area.  Wear comfortable clothing and clothing appropriate for easy access to any Portacath or PICC line.   We strive to give you quality time with your provider. You may need to reschedule your appointment if you arrive late (15 or more minutes).  Arriving late affects you and other patients whose appointments are after yours.  Also, if you miss three or more appointments without notifying the office, you may be dismissed from the clinic at the provider's discretion.      For prescription refill requests, have your pharmacy contact our office and allow 72 hours for refills to be completed.    Today you received the following chemotherapy and/or immunotherapy agents: Velcade      To help prevent nausea and vomiting after your treatment, we encourage you to take your nausea medication as directed.  BELOW ARE SYMPTOMS THAT SHOULD BE REPORTED IMMEDIATELY: *FEVER GREATER THAN 100.4 F (38 C) OR HIGHER *CHILLS OR SWEATING *NAUSEA AND VOMITING THAT IS NOT CONTROLLED WITH YOUR NAUSEA MEDICATION *UNUSUAL SHORTNESS OF BREATH *UNUSUAL BRUISING OR BLEEDING *URINARY PROBLEMS (pain or burning when urinating, or frequent urination) *BOWEL PROBLEMS (unusual diarrhea, constipation, pain near the anus) TENDERNESS IN MOUTH AND THROAT WITH OR WITHOUT PRESENCE OF ULCERS (sore throat, sores in mouth, or a toothache) UNUSUAL RASH, SWELLING OR PAIN  UNUSUAL VAGINAL DISCHARGE OR ITCHING   Items with * indicate a potential emergency and should be followed up as soon as possible or go to the Emergency Department if any problems should occur.  Please show the CHEMOTHERAPY ALERT CARD or IMMUNOTHERAPY ALERT CARD at check-in to  the Emergency Department and triage nurse.  Should you have questions after your visit or need to cancel or reschedule your appointment, please contact [facility]  [phone] and follow the prompts.  Office hours are 8:00 a.m. to 4:30 p.m. Monday - Friday. Please note that voicemails left after 4:00 p.m. may not be returned until the following business day.  We are closed weekends and major holidays. You have access to a nurse at all times for urgent questions. Please call the main number to the clinic [phone] and follow the prompts.  For any non-urgent questions, you may also contact your provider using MyChart. We now offer e-Visits for anyone 18 and older to request care online for non-urgent symptoms. For details visit [email or web address].   Also download the MyChart app! Go to the app store, search "MyChart", open the app, select [facility], and log in with your MyChart username and password.  Due to Covid, a mask is required upon entering the hospital/clinic. If you do not have a mask, one will be given to you upon arrival. For doctor visits, patients may have 1 support person aged 18 or older with them. For treatment visits, patients cannot have anyone with them due to current Covid guidelines and our immunocompromised population. Bortezomib injection What is this medication? BORTEZOMIB (bor TEZ oh mib) targets proteins in Tommy cells and stops the Tommy cells from growing. It treats multiple myeloma and mantle cell lymphoma. This medicine may be used for other purposes; ask your health care provider or pharmacist if you have questions. COMMON BRAND NAME(S): Velcade What should I   tell my care team before I take this medication? They need to know if you have any of these conditions: dehydration diabetes (high blood sugar) heart disease liver disease tingling of the fingers or toes or other nerve disorder an unusual or allergic reaction to  bortezomib, mannitol, boron, other medicines, foods, dyes, or preservatives pregnant or trying to get pregnant breast-feeding How should I use this medication? This medicine is injected into a vein or under the skin. It is given by a health care provider in a hospital or clinic setting. Talk to your health care provider about the use of this medicine in children. Special care may be needed. Overdosage: If you think you have taken too much of this medicine contact a poison control [facility] or emergency room at once. NOTE: This medicine is only for you. Do not share this medicine with others. What if I miss a dose? Keep appointments for follow-up doses. It is important not to miss your dose. Call your health care provider if you are unable to keep an appointment. What may interact with this medication? This medicine may interact with the following medications: ketoconazole rifampin This list may not describe all possible interactions. Give your health care provider a list of all the medicines, herbs, non-prescription drugs, or dietary supplements you use. Also tell them if you smoke, drink alcohol, or use illegal drugs. Some items may interact with your medicine. What should I watch for while using this medication? Your condition will be monitored carefully while you are receiving this medicine. You may need blood work done while you are taking this medicine. You may get drowsy or dizzy. Do not drive, use machinery, or do anything that needs mental alertness until you know how this medicine affects you. Do not stand up or sit up quickly, especially if you are an older patient. This reduces the risk of dizzy or fainting spells This medicine may increase your risk of getting an infection. Call your health care provider for advice if you get a fever, chills, sore throat, or other symptoms of a cold or flu. Do not treat yourself. Try to avoid being around people who are sick. Check with your health care  provider if you have severe diarrhea, nausea, and vomiting, or if you sweat a lot. The loss of too much body fluid may make it dangerous for you to take this medicine. Do not become pregnant while taking this medicine or for 7 months after stopping it. Women should inform their health care provider if they wish to become pregnant or think they might be pregnant. Men should not father a child while taking this medicine and for 4 months after stopping it. There is a potential for serious harm to an unborn child. Talk to your health care provider for more information. Do not breast-feed an infant while taking this medicine or for 2 months after stopping it. This medicine may make it more difficult to get pregnant or father a child. Talk to your health care provider if you are concerned about your fertility. What side effects may I notice from receiving this medication? Side effects that you should report to your doctor or health care professional as soon as possible: allergic reactions (skin rash; itching or hives; swelling of the face, lips, or tongue) bleeding (bloody or black, tarry stools; red or dark brown urine; spitting up blood or brown material that looks like coffee grounds; red spots on the skin; unusual bruising or bleeding from the eye, gums, or   nose) blurred vision or changes in vision confusion constipation headache heart failure (trouble breathing; fast, irregular heartbeat; sudden weight gain; swelling of the ankles, feet, hands) infection (fever, chills, cough, sore throat, pain or trouble passing urine) lack or loss of appetite liver injury (dark yellow or brown urine; general ill feeling or flu-like symptoms; loss of appetite, right upper belly pain; yellowing of the eyes or skin) low blood pressure (dizziness; feeling faint or lightheaded, falls; unusually weak or tired) muscle cramps pain, redness, or irritation at site where injected pain, tingling, numbness in the hands or  feet seizures trouble breathing unusual bruising or bleeding Side effects that usually do not require medical attention (report to your doctor or health care professional if they continue or are bothersome): diarrhea nausea stomach pain trouble sleeping vomiting This list may not describe all possible side effects. Call your doctor for medical advice about side effects. You may report side effects to FDA at 1-800-FDA-1088. Where should I keep my medication? This medicine is given in a hospital or clinic. It will not be stored at home. NOTE: This sheet is a summary. It may not cover all possible information. If you have questions about this medicine, talk to your doctor, pharmacist, or health care provider.  2022 Elsevier/Gold Standard (2020-03-19 13:22:53)  

## 2020-12-23 NOTE — Progress Notes (Signed)
Hematology/Oncology follow up note [facility] [phone] [phone]   Patient Care Team: Sofie Hartigan, MD as PCP - General (Family Medicine) Noreene Filbert, MD as Radiation Oncologist (Radiation Oncology)  REFERRING PROVIDER: Sofie Hartigan, MD  CHIEF COMPLAINTS/REASON FOR VISIT:  Follow-up for multiple myeloma HISTORY OF PRESENTING ILLNESS:   [facility] Rodino. is a  65 y.o.  male with PMH listed below was seen in consultation at the request of  Sofie Hartigan, MD  for evaluation of abnormal serum protein electrophoresis.  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  Increase calcium level at 12.3, PTH was normal.  Creatinine 1, estimated GFR 91, CBC showed hemoglobin 12.4, Patient also recently had a CT chest abdomen pelvis with contrast for evaluation of right flank pain and right lower lobe nodule. 01/29/2020, CT showed multiple bilateral pulmonary nodules measuring up to 10 mm in the right middle lobe, nodules are indeterminate, infectious/inflammatory etiology versus metastatic disease. Multiple osseous lytic lesions, largest lesion involving the left pubic bone concerning for metastatic disease. Compression fractures at T9, age indeterminate. Appearance focal area of thickening at the gastroesophageal junction.  Further evaluation with upper GI study or direct visualization with endoscopy is recommended No bowel obstruction. Aortic atherosclerosis.  Patient reports NSAIDs as needed for lower back pain.  Mid lower back pain usually is worse when when he stands up from sitting position.  Patient works in the [employer].  Lives at home with wife.  Denies any unintentional weight loss, fever, chills, night sweating  # IgG multiple myeloma, stage II, del 1 p, high risk Baseline M protein 4.1, kappa light chain level 333.8 Beta-2 microglobulin 2.1, albumin 2.7. 02/12/2020, bone marrow  biopsy showed hypercellular for age, 80% plasma cell which are complex restricted by light chain in situ heparinization.  Background hematopoiesis is present but reduced. Cytogenetics is normal, Myeloma FISH panel-deletion 1P,Standard risk. He is not interested in  # 02/27/2020 patient has been started on Revlimid 25 mg D1-14 #02/24/2020- 08/26/2020  Velcade weekly with dexamethasone 20 mg weekly  # 08/14/2020 M protein 0.5, close to 90% reduction from original M protein of 4.1, light chain ration 1.76 # 08/18/2020  Bone marrow results were reviewed.  1% plasma cell.  Normal cytogenetics.  Myeloma FISH negative for 1p del. #09/01/2020, patient agreed for second opinion at [facility] for evaluation and discussion of possible bone marrow transplant. He prefers to hold off maintenance treatment until his [place] appointment.   #Focal area of thickening at the GE junction. 10/23/2020 EGD is normal.  #Lung nodules, measures up to 10 mm on the right middle lobe.  Indeterminate.  Attention on follow-up.  Recommend repeat CT chest and he declined  11/03/2020, M protein 0.6, lambda free light chain 4.9, kappa light chain 14.8, kappa lambda light chain ratio 3.02. Discussed about proceed with 1 cycle of Revlimid 25 mg 2 weeks on 1 week off while waiting for [facility] second opinion.  He agrees with the plan.   INTERVAL HISTORY [facility] Albers. is a 65 y.o. male who has above history reviewed by me today presents for follow up visit for multiple myeloma  12/09/2020 Evaluated by Dr.Gasparetto at [facility].  Recommend patient to resume VRD. Bone marrow biopsy to be repeated at [facility].   Today patient reports doing well. No new complaints.  He has 2 pounds since last visit. Appetite is fair.   Review of Systems  Constitutional:  Negative for appetite change, chills, fatigue, fever  and unexpected weight change.  HENT:   Negative for hearing loss and voice change.   Eyes:  Negative for eye problems and icterus.  Respiratory:   Negative for chest tightness, cough and shortness of breath.   Cardiovascular:  Negative for chest pain and leg swelling.  Gastrointestinal:  Negative for abdominal distention, abdominal pain and constipation.  Endocrine: Negative for hot flashes.  Genitourinary:  Negative for difficulty urinating, dysuria and frequency.   Musculoskeletal:  Negative for arthralgias, back pain and neck pain.  Skin:  Negative for itching and rash.  Neurological:  Negative for light-headedness and numbness.  Hematological:  Negative for adenopathy. Does not bruise/bleed easily.  Psychiatric/Behavioral:  Negative for confusion.     MEDICAL HISTORY:  Past Medical History:  Diagnosis Date   Cancer ([place])    Depression    Hypercalcemia 03/02/2020   Hyperlipemia    Hypertension    Hypogonadism in male    Multiple myeloma [facility])     SURGICAL HISTORY: Past Surgical History:  Procedure Laterality Date   BONE MARROW BIOPSY     COLONOSCOPY  07/14/2008   ESOPHAGOGASTRODUODENOSCOPY N/A 10/22/2020   Procedure: ESOPHAGOGASTRODUODENOSCOPY (EGD);  Surgeon: Jonathon Bellows, MD;  Location[facility] ENDOSCOPY;  Service: Gastroenterology;  Laterality: N/A;    SOCIAL HISTORY: Social History   Socioeconomic History   Marital status: Married    Spouse name: Not on file   Number of children: Not on file   Years of education: Not on file   Highest education level: Not on file  Occupational History   Not on file  Tobacco Use   Smoking status: Every Day    Packs/day: 1.00    Years: 30.00    Pack years: 30.00    Types: Cigarettes   Smokeless tobacco: Never  Substance and Sexual Activity   Alcohol use: Yes    Comment: occcassional   Drug use: No   Sexual activity: Not on file  Other Topics Concern   Not on file  Social History Narrative   Not on file   Social Determinants of Health   Financial Resource Strain: Not on file  Food Insecurity: Not on file  Transportation Needs: Not on file  Physical Activity: Not  on file  Stress: Not on file  Social Connections: Not on file  Intimate Partner Violence: Not on file    FAMILY HISTORY: Family History  Problem Relation Age of Onset   Skin cancer Mother    Prostate cancer Father     ALLERGIES:  has No Known Allergies.  MEDICATIONS:  Current Outpatient Medications  Medication Sig Dispense Refill   acyclovir (ZOVIRAX) 400 MG tablet TAKE 1 TABLET BY MOUTH TWICE A DAY 180 tablet 1   amLODipine (NORVASC) 10 MG tablet Take by mouth daily.     ASPIRIN 81 PO Take 81 mg by mouth daily.     atorvastatin (LIPITOR) 40 MG tablet Take 40 mg by mouth daily.     buPROPion (WELLBUTRIN XL) 300 MG 24 hr tablet Take 300 mg by mouth daily.     KLOR-CON M20 20 MEQ tablet TAKE 1 TABLET BY MOUTH TWICE A DAY 180 tablet 1   losartan-hydrochlorothiazide (HYZAAR) 100-12.5 MG tablet Take 1 tablet by mouth daily.     polyethylene glycol (MIRALAX) 17 g packet Take 17 g by mouth daily. 14 each 0   lenalidomide (REVLIMID) 25 MG capsule Take 1 capsule (25 mg total) by mouth daily. Take 14 days on, 7 days off, repeat every 21  days. (Patient not taking: Reported on 12/23/2020) 14 capsule 0   omeprazole (PRILOSEC) 20 MG capsule Take 1 capsule (20 mg total) by mouth daily. (Patient not taking: Reported on 12/23/2020) 30 capsule 1   Psyllium (METAMUCIL MULTIHEALTH FIBER PO) Take by mouth daily. (Patient not taking: Reported on 12/23/2020)     senna-docusate (SENNA S) 8.6-50 MG tablet Take 2 tablets by mouth daily. (Patient not taking: Reported on 12/23/2020) 60 tablet 3   No current facility-administered medications for this visit.     PHYSICAL EXAMINATION: ECOG PERFORMANCE STATUS: 1 - Symptomatic but completely ambulatory Vitals:   12/23/20 1047  BP: 109/81  Pulse: 74  Resp: 16  Temp: (!) 96.1 F (35.6 C)  SpO2: 99%   Filed Weights   12/23/20 1047  Weight: 208 lb (94.3 kg)    Physical Exam Constitutional:      General: He is not in acute distress.    Appearance: He is  not diaphoretic.  HENT:     Head: Normocephalic and atraumatic.     Nose: Nose normal.     Mouth/Throat:     Pharynx: No oropharyngeal exudate.  Eyes:     General: No scleral icterus.    Pupils: Pupils are equal, round, and reactive to light.  Cardiovascular:     Rate and Rhythm: Normal rate and regular rhythm.     Heart sounds: No murmur heard. Pulmonary:     Effort: Pulmonary effort is normal. No respiratory distress.     Breath sounds: No rales.  Chest:     Chest wall: No tenderness.  Abdominal:     General: There is no distension.     Palpations: Abdomen is soft.     Tenderness: There is no abdominal tenderness.  Musculoskeletal:        General: Normal range of motion.     Cervical back: Normal range of motion and neck supple.  Skin:    General: Skin is warm and dry.     Findings: No erythema.  Neurological:     Mental Status: He is alert and oriented to person, place, and time.     Cranial Nerves: No cranial nerve deficit.     Motor: No abnormal muscle tone.     Coordination: Coordination normal.  Psychiatric:        Mood and Affect: Affect normal.      LABORATORY DATA:  I have reviewed the data as listed Lab Results  Component Value Date   WBC 5.0 12/23/2020   HGB 14.0 12/23/2020   HCT 42.6 12/23/2020   MCV 86.9 12/23/2020   PLT 231 12/23/2020   Recent Labs    11/03/20 0940 11/12/20 1327 12/23/20 0922  NA 139 136 138  K 3.3* 3.6 3.5  CL 102 102 105  CO2 27 27 28   GLUCOSE 86 80 76  BUN 12 12 14   CREATININE 0.80 0.81 0.82  CALCIUM 9.6 9.5 9.0  GFRNONAA >60 >60 >60  PROT 6.8 7.1 6.8  ALBUMIN 4.0 4.2 3.8  AST 21 17 35  ALT 28 20 61*  ALKPHOS 55 60 56  BILITOT 0.9 1.4* 0.8    Iron/TIBC/Ferritin/ %Sat No results found for: IRON, TIBC, FERRITIN, IRONPCTSAT    RADIOGRAPHIC STUDIES: I have personally reviewed the radiological images as listed and agreed with the findings in the report. No results found.     ASSESSMENT & PLAN:  1. Multiple  myeloma not having achieved remission ([facility])   2. Encounter for antineoplastic chemotherapy  3. Hypokalemia   4. Hypercalcemia   5. Lytic bone lesions on xray   6. Lung nodule   Cancer Staging Multiple myeloma not having achieved remission ([place]) Staging form: Plasma Cell Myeloma and Plasma Cell Disorders, AJCC 8th Edition - Clinical stage from 02/04/2020: Beta-2-microglobulin (mg/L): 2.1, Albumin (g/dL): 2.7, ISS: Stage II, High-risk cytogenetics: Absent, LDH: Unknown - Signed by Earlie Server, MD on 05/06/2020   #IgG multiple myeloma, stage II, del 1 p, high risk.  Declined bone marrow transplant evaluation. 1st treatment with RVD, BM biopsy after 8 cycles showed 1% plasma cells.  Labs are reviewed and discussed with patient. Resume and proceed with cycle 9 VRD Velcade and Dex weely Take Revlimid 24m 2 weeks on 1 weeks off.  Continue  aspirin 81 mg daily for DVT prophylaxis. Continue acyclovir 400 mg twice daily.  #Bone lesion, Last dose zometa 08/07/2020. .Marland Kitchen #Hypocalcemia, mild.  recommend calcium 1200 mg daily.  # hypokalemia, potassium stable at 3.5.  continue potassium chloride 20 mEq daily. #Lung nodules,declined CT chest  We spent sufficient time to discuss many aspect of care, questions were answered to patient's satisfaction. All questions were answered. The patient knows to call the clinic with any problems questions or concerns.  cc FSofie Hartigan MD   Return of visit:  T Weekly Velcade and Dex x 2 Lab MD Velcade and Dex in 3 weeks.    ZEarlie Server MD, PhD Hematology Oncology [facility] Centerat [facility] Center IncPager- [phone]/14/2022

## 2020-12-23 NOTE — Progress Notes (Signed)
Pt in for follow up, reports "still weak in the legs".

## 2020-12-24 ENCOUNTER — Telehealth: Payer: Self-pay

## 2020-12-24 ENCOUNTER — Encounter: Payer: Self-pay | Admitting: Oncology

## 2020-12-24 ENCOUNTER — Other Ambulatory Visit: Payer: Self-pay

## 2020-12-24 DIAGNOSIS — C9000 Multiple myeloma not having achieved remission: Secondary | ICD-10-CM

## 2020-12-24 LAB — KAPPA/LAMBDA LIGHT CHAINS
Kappa free light chain: 20.3 mg/L — ABNORMAL HIGH (ref 3.3–19.4)
Kappa, lambda light chain ratio: 4.06 — ABNORMAL HIGH (ref 0.26–1.65)
Lambda free light chains: 5 mg/L — ABNORMAL LOW (ref 5.7–26.3)

## 2020-12-24 LAB — IGG: IgG (Immunoglobin G), Serum: 907 mg/dL (ref 603–1613)

## 2020-12-24 MED ORDER — LENALIDOMIDE 25 MG PO CAPS
25.0000 mg | ORAL_CAPSULE | Freq: Every day | ORAL | 0 refills | Status: DC
Start: 2020-12-24 — End: 2021-01-12

## 2020-12-24 NOTE — Telephone Encounter (Signed)
-----   Message from Earlie Server, MD sent at 12/23/2020  8:12 PM EDT ----- Please add Zometa to his next treatment- not new

## 2020-12-25 LAB — PROTEIN ELECTROPHORESIS, SERUM
A/G Ratio: 1.5 (ref 0.7–1.7)
Albumin ELP: 3.5 g/dL (ref 2.9–4.4)
Alpha-1-Globulin: 0.2 g/dL (ref 0.0–0.4)
Alpha-2-Globulin: 0.6 g/dL (ref 0.4–1.0)
Beta Globulin: 1.3 g/dL (ref 0.7–1.3)
Gamma Globulin: 0.3 g/dL — ABNORMAL LOW (ref 0.4–1.8)
Globulin, Total: 2.4 g/dL (ref 2.2–3.9)
M-Spike, %: 0.6 g/dL — ABNORMAL HIGH
Total Protein ELP: 5.9 g/dL — ABNORMAL LOW (ref 6.0–8.5)

## 2020-12-29 ENCOUNTER — Inpatient Hospital Stay: Payer: Medicare Other

## 2020-12-29 ENCOUNTER — Other Ambulatory Visit: Payer: Self-pay | Admitting: Oncology

## 2020-12-29 ENCOUNTER — Other Ambulatory Visit: Payer: Self-pay

## 2020-12-29 DIAGNOSIS — C9000 Multiple myeloma not having achieved remission: Secondary | ICD-10-CM

## 2020-12-29 LAB — COMPREHENSIVE METABOLIC PANEL
ALT: 31 U/L (ref 0–44)
AST: 12 U/L — ABNORMAL LOW (ref 15–41)
Albumin: 3.6 g/dL (ref 3.5–5.0)
Alkaline Phosphatase: 51 U/L (ref 38–126)
Anion gap: 5 (ref 5–15)
BUN: 13 mg/dL (ref 8–23)
CO2: 31 mmol/L (ref 22–32)
Calcium: 9.2 mg/dL (ref 8.9–10.3)
Chloride: 101 mmol/L (ref 98–111)
Creatinine, Ser: 0.75 mg/dL (ref 0.61–1.24)
GFR, Estimated: >60 mL/min (ref >60)
Glucose, Bld: 89 mg/dL (ref 70–99)
Potassium: 3.5 mmol/L (ref 3.5–5.1)
Sodium: 137 mmol/L (ref 135–145)
Total Bilirubin: 1 mg/dL (ref 0.3–1.2)
Total Protein: 6.5 g/dL (ref 6.5–8.1)

## 2020-12-29 LAB — CBC WITH DIFFERENTIAL/PLATELET
Abs Immature Granulocytes: 0.02 10*3/uL (ref 0.00–0.07)
Basophils Absolute: 0 10*3/uL (ref 0.0–0.1)
Basophils Relative: 0 %
Eosinophils Absolute: 0.6 10*3/uL — ABNORMAL HIGH (ref 0.0–0.5)
Eosinophils Relative: 11 %
HCT: 39.6 % (ref 39.0–52.0)
Hemoglobin: 13 g/dL (ref 13.0–17.0)
Immature Granulocytes: 0 %
Lymphocytes Relative: 28 %
Lymphs Abs: 1.4 10*3/uL (ref 0.7–4.0)
MCH: 28.4 pg (ref 26.0–34.0)
MCHC: 32.8 g/dL (ref 30.0–36.0)
MCV: 86.5 fL (ref 80.0–100.0)
Monocytes Absolute: 0.6 10*3/uL (ref 0.1–1.0)
Monocytes Relative: 12 %
Neutro Abs: 2.4 10*3/uL (ref 1.7–7.7)
Neutrophils Relative %: 49 %
Platelets: 161 10*3/uL (ref 150–400)
RBC: 4.58 MIL/uL (ref 4.22–5.81)
RDW: 15.2 % (ref 11.5–15.5)
WBC: 5 10*3/uL (ref 4.0–10.5)
nRBC: 0 % (ref 0.0–0.2)

## 2020-12-29 MED ORDER — DEXAMETHASONE 4 MG PO TABS
20.0000 mg | ORAL_TABLET | Freq: Once | ORAL | Status: AC
  Administered 2020-12-29: 20 mg via ORAL
  Filled 2020-12-29: qty 5

## 2020-12-29 MED ORDER — ZOLEDRONIC ACID 4 MG/100ML IV SOLN
4.0000 mg | Freq: Once | INTRAVENOUS | Status: AC
  Administered 2020-12-29: 4 mg via INTRAVENOUS
  Filled 2020-12-29: qty 100

## 2020-12-29 MED ORDER — BORTEZOMIB CHEMO SQ INJECTION 3.5 MG (2.5MG/ML)
1.3000 mg/m2 | Freq: Once | INTRAMUSCULAR | Status: AC
  Administered 2020-12-29: 3 mg via SUBCUTANEOUS
  Filled 2020-12-29: qty 1.2

## 2020-12-29 MED ORDER — SODIUM CHLORIDE 0.9 % IV SOLN
Freq: Once | INTRAVENOUS | Status: AC
  Filled 2020-12-29: qty 250

## 2020-12-29 NOTE — Patient Instructions (Signed)
[facility]  Discharge Instructions: Thank you for choosing [facility] to provide your oncology and hematology care.  If you have a lab appointment with the [facility], please go directly to the [facility] and check in at the registration area.  Wear comfortable clothing and clothing appropriate for easy access to any Portacath or PICC line.   We strive to give you quality time with your provider. You may need to reschedule your appointment if you arrive late (15 or more minutes).  Arriving late affects you and other patients whose appointments are after yours.  Also, if you miss three or more appointments without notifying the office, you may be dismissed from the clinic at the provider's discretion.      For prescription refill requests, have your pharmacy contact our office and allow 72 hours for refills to be completed.    Today you received the following chemotherapy and/or immunotherapy agents - velcade      To help prevent nausea and vomiting after your treatment, we encourage you to take your nausea medication as directed.  BELOW ARE SYMPTOMS THAT SHOULD BE REPORTED IMMEDIATELY: *FEVER GREATER THAN 100.4 F (38 C) OR HIGHER *CHILLS OR SWEATING *NAUSEA AND VOMITING THAT IS NOT CONTROLLED WITH YOUR NAUSEA MEDICATION *UNUSUAL SHORTNESS OF BREATH *UNUSUAL BRUISING OR BLEEDING *URINARY PROBLEMS (pain or burning when urinating, or frequent urination) *BOWEL PROBLEMS (unusual diarrhea, constipation, pain near the anus) TENDERNESS IN MOUTH AND THROAT WITH OR WITHOUT PRESENCE OF ULCERS (sore throat, sores in mouth, or a toothache) UNUSUAL RASH, SWELLING OR PAIN  UNUSUAL VAGINAL DISCHARGE OR ITCHING   Items with * indicate a potential emergency and should be followed up as soon as possible or go to the Emergency Department if any problems should occur.  Please show the CHEMOTHERAPY ALERT CARD or IMMUNOTHERAPY ALERT CARD at check-in  to the Emergency Department and triage nurse.  Should you have questions after your visit or need to cancel or reschedule your appointment, please contact [facility]  [phone] and follow the prompts.  Office hours are 8:00 a.m. to 4:30 p.m. Monday - Friday. Please note that voicemails left after 4:00 p.m. may not be returned until the following business day.  We are closed weekends and major holidays. You have access to a nurse at all times for urgent questions. Please call the main number to the clinic [phone] and follow the prompts.  For any non-urgent questions, you may also contact your provider using MyChart. We now offer e-Visits for anyone 50 and older to request care online for non-urgent symptoms. For details visit [email or web address].   Also download the MyChart app! Go to the app store, search "MyChart", open the app, select , and log in with your MyChart username and password.  Due to Covid, a mask is required upon entering the hospital/clinic. If you do not have a mask, one will be given to you upon arrival. For doctor visits, patients may have 1 support person aged 51 or older with them. For treatment visits, patients cannot have anyone with them due to current Covid guidelines and our immunocompromised population.   Bortezomib injection What is this medication? BORTEZOMIB (bor TEZ oh mib) targets proteins in cancer cells and stops the cancer cells from growing. It treats multiple myeloma and mantle cell lymphoma. This medicine may be used for other purposes; ask your health care provider or pharmacist if you have questions. COMMON BRAND NAME(S): Velcade  What should I tell my care team before I take this medication? They need to know if you have any of these conditions: dehydration diabetes (high blood sugar) heart disease liver disease tingling of the fingers or toes or other nerve disorder an unusual or allergic  reaction to bortezomib, mannitol, boron, other medicines, foods, dyes, or preservatives pregnant or trying to get pregnant breast-feeding How should I use this medication? This medicine is injected into a vein or under the skin. It is given by a health care provider in a hospital or clinic setting. Talk to your health care provider about the use of this medicine in children. Special care may be needed. Overdosage: If you think you have taken too much of this medicine contact a poison control center or emergency room at once. NOTE: This medicine is only for you. Do not share this medicine with others. What if I miss a dose? Keep appointments for follow-up doses. It is important not to miss your dose. Call your health care provider if you are unable to keep an appointment. What may interact with this medication? This medicine may interact with the following medications: ketoconazole rifampin This list may not describe all possible interactions. Give your health care provider a list of all the medicines, herbs, non-prescription drugs, or dietary supplements you use. Also tell them if you smoke, drink alcohol, or use illegal drugs. Some items may interact with your medicine. What should I watch for while using this medication? Your condition will be monitored carefully while you are receiving this medicine. You may need blood work done while you are taking this medicine. You may get drowsy or dizzy. Do not drive, use machinery, or do anything that needs mental alertness until you know how this medicine affects you. Do not stand up or sit up quickly, especially if you are an older patient. This reduces the risk of dizzy or fainting spells This medicine may increase your risk of getting an infection. Call your health care provider for advice if you get a fever, chills, sore throat, or other symptoms of a cold or flu. Do not treat yourself. Try to avoid being around people who are sick. Check with your  health care provider if you have severe diarrhea, nausea, and vomiting, or if you sweat a lot. The loss of too much body fluid may make it dangerous for you to take this medicine. Do not become pregnant while taking this medicine or for 7 months after stopping it. Women should inform their health care provider if they wish to become pregnant or think they might be pregnant. Men should not father a child while taking this medicine and for 4 months after stopping it. There is a potential for serious harm to an unborn child. Talk to your health care provider for more information. Do not breast-feed an infant while taking this medicine or for 2 months after stopping it. This medicine may make it more difficult to get pregnant or father a child. Talk to your health care provider if you are concerned about your fertility. What side effects may I notice from receiving this medication? Side effects that you should report to your doctor or health care professional as soon as possible: allergic reactions (skin rash; itching or hives; swelling of the face, lips, or tongue) bleeding (bloody or black, tarry stools; red or dark brown urine; spitting up blood or brown material that looks like coffee grounds; red spots on the skin; unusual bruising or bleeding from the  eye, gums, or nose) blurred vision or changes in vision confusion constipation headache heart failure (trouble breathing; fast, irregular heartbeat; sudden weight gain; swelling of the ankles, feet, hands) infection (fever, chills, cough, sore throat, pain or trouble passing urine) lack or loss of appetite liver injury (dark yellow or brown urine; general ill feeling or flu-like symptoms; loss of appetite, right upper belly pain; yellowing of the eyes or skin) low blood pressure (dizziness; feeling faint or lightheaded, falls; unusually weak or tired) muscle cramps pain, redness, or irritation at site where injected pain, tingling, numbness in the  hands or feet seizures trouble breathing unusual bruising or bleeding Side effects that usually do not require medical attention (report to your doctor or health care professional if they continue or are bothersome): diarrhea nausea stomach pain trouble sleeping vomiting This list may not describe all possible side effects. Call your doctor for medical advice about side effects. You may report side effects to FDA at 1-800-FDA-1088. Where should I keep my medication? This medicine is given in a hospital or clinic. It will not be stored at home. NOTE: This sheet is a summary. It may not cover all possible information. If you have questions about this medicine, talk to your doctor, pharmacist, or health care provider.  2022 Elsevier/Gold Standard (2020-03-19 13:22:53)  Zoledronic Acid Injection (Hypercalcemia, Oncology) What is this medication? ZOLEDRONIC ACID (ZOE le dron ik AS id) slows calcium loss from bones. It high calcium levels in the blood from some kinds of cancer. It may be used in other people at risk for bone loss. This medicine may be used for other purposes; ask your health care provider or pharmacist if you have questions. COMMON BRAND NAME(S): Zometa What should I tell my care team before I take this medication? They need to know if you have any of these conditions: cancer dehydration dental disease kidney disease liver disease low levels of calcium in the blood lung or breathing disease (asthma) receiving steroids like dexamethasone or prednisone an unusual or allergic reaction to zoledronic acid, other medicines, foods, dyes, or preservatives pregnant or trying to get pregnant breast-feeding How should I use this medication? This drug is injected into a vein. It is given by a health care provider in a hospital or clinic setting. Talk to your health care provider about the use of this drug in children. Special care may be needed. Overdosage: If you think you have  taken too much of this medicine contact a poison control center or emergency room at once. NOTE: This medicine is only for you. Do not share this medicine with others. What if I miss a dose? Keep appointments for follow-up doses. It is important not to miss your dose. Call your health care provider if you are unable to keep an appointment. What may interact with this medication? certain antibiotics given by injection NSAIDs, medicines for pain and inflammation, like ibuprofen or naproxen some diuretics like bumetanide, furosemide teriparatide thalidomide This list may not describe all possible interactions. Give your health care provider a list of all the medicines, herbs, non-prescription drugs, or dietary supplements you use. Also tell them if you smoke, drink alcohol, or use illegal drugs. Some items may interact with your medicine. What should I watch for while using this medication? Visit your health care provider for regular checks on your progress. It may be some time before you see the benefit from this drug. Some people who take this drug have severe bone, joint, or muscle pain. This  drug may also increase your risk for jaw problems or a broken thigh bone. Tell your health care provider right away if you have severe pain in your jaw, bones, joints, or muscles. Tell you health care provider if you have any pain that does not go away or that gets worse. Tell your dentist and dental surgeon that you are taking this drug. You should not have major dental surgery while on this drug. See your dentist to have a dental exam and fix any dental problems before starting this drug. Take good care of your teeth while on this drug. Make sure you see your dentist for regular follow-up appointments. You should make sure you get enough calcium and vitamin D while you are taking this drug. Discuss the foods you eat and the vitamins you take with your health care provider. Check with your health care provider  if you have severe diarrhea, nausea, and vomiting, or if you sweat a lot. The loss of too much body fluid may make it dangerous for you to take this drug. You may need blood work done while you are taking this drug. Do not become pregnant while taking this drug. Women should inform their health care provider if they wish to become pregnant or think they might be pregnant. There is potential for serious harm to an unborn child. Talk to your health care provider for more information. What side effects may I notice from receiving this medication? Side effects that you should report to your doctor or health care provider as soon as possible: allergic reactions (skin rash, itching or hives; swelling of the face, lips, or tongue) bone pain infection (fever, chills, cough, sore throat, pain or trouble passing urine) jaw pain, especially after dental work joint pain kidney injury (trouble passing urine or change in the amount of urine) low blood pressure (dizziness; feeling faint or lightheaded, falls; unusually weak or tired) low calcium levels (fast heartbeat; muscle cramps or pain; pain, tingling, or numbness in the hands or feet; seizures) low magnesium levels (fast, irregular heartbeat; muscle cramp or pain; muscle weakness; tremors; seizures) low red blood cell counts (trouble breathing; feeling faint; lightheaded, falls; unusually weak or tired) muscle pain redness, blistering, peeling, or loosening of the skin, including inside the mouth severe diarrhea swelling of the ankles, feet, hands trouble breathing Side effects that usually do not require medical attention (report to your doctor or health care provider if they continue or are bothersome): anxious constipation coughing depressed mood eye irritation, itching, or pain fever general ill feeling or flu-like symptoms nausea pain, redness, or irritation at site where injected trouble sleeping This list may not describe all possible  side effects. Call your doctor for medical advice about side effects. You may report side effects to FDA at 1-800-FDA-1088. Where should I keep my medication? This drug is given in a hospital or clinic. It will not be stored at home. NOTE: This sheet is a summary. It may not cover all possible information. If you have questions about this medicine, talk to your doctor, pharmacist, or health care provider.  2022 Elsevier/Gold Standard (2019-01-10 09:13:00)

## 2020-12-29 NOTE — Progress Notes (Signed)
Patient with redness and streaking from previous Velcade injection site on RLQ abdomen since last week. It hasn't changed during that time, isn't painful, no warmth or firmness to touch. Notified Dr. Tasia Catchings. Beckey Rutter NP to chairside to assess. OK to proceed with Velcade injection on opposite side today. Proceeded with injection per orders.  Instructed patient to call if pain, spreading of redness outside of the current area, swelling, drainage, fever/chills.

## 2021-01-05 ENCOUNTER — Inpatient Hospital Stay: Payer: Medicare Other

## 2021-01-05 ENCOUNTER — Other Ambulatory Visit: Payer: Self-pay

## 2021-01-05 VITALS — BP 115/70 | HR 70 | Temp 97.2°F | Wt 208.0 lb

## 2021-01-05 DIAGNOSIS — C9000 Multiple myeloma not having achieved remission: Secondary | ICD-10-CM

## 2021-01-05 LAB — COMPREHENSIVE METABOLIC PANEL
ALT: 26 U/L (ref 0–44)
AST: 13 U/L — ABNORMAL LOW (ref 15–41)
Albumin: 3.4 g/dL — ABNORMAL LOW (ref 3.5–5.0)
Alkaline Phosphatase: 52 U/L (ref 38–126)
Anion gap: 6 (ref 5–15)
BUN: 13 mg/dL (ref 8–23)
CO2: 29 mmol/L (ref 22–32)
Calcium: 8.9 mg/dL (ref 8.9–10.3)
Chloride: 102 mmol/L (ref 98–111)
Creatinine, Ser: 0.75 mg/dL (ref 0.61–1.24)
GFR, Estimated: >60 mL/min (ref >60)
Glucose, Bld: 88 mg/dL (ref 70–99)
Potassium: 3.7 mmol/L (ref 3.5–5.1)
Sodium: 137 mmol/L (ref 135–145)
Total Bilirubin: 1 mg/dL (ref 0.3–1.2)
Total Protein: 6.3 g/dL — ABNORMAL LOW (ref 6.5–8.1)

## 2021-01-05 LAB — CBC WITH DIFFERENTIAL/PLATELET
Abs Immature Granulocytes: 0.02 10*3/uL (ref 0.00–0.07)
Basophils Absolute: 0 10*3/uL (ref 0.0–0.1)
Basophils Relative: 0 %
Eosinophils Absolute: 0.4 10*3/uL (ref 0.0–0.5)
Eosinophils Relative: 6 %
HCT: 38.8 % — ABNORMAL LOW (ref 39.0–52.0)
Hemoglobin: 12.8 g/dL — ABNORMAL LOW (ref 13.0–17.0)
Immature Granulocytes: 0 %
Lymphocytes Relative: 18 %
Lymphs Abs: 1.1 10*3/uL (ref 0.7–4.0)
MCH: 28.6 pg (ref 26.0–34.0)
MCHC: 33 g/dL (ref 30.0–36.0)
MCV: 86.6 fL (ref 80.0–100.0)
Monocytes Absolute: 0.9 10*3/uL (ref 0.1–1.0)
Monocytes Relative: 14 %
Neutro Abs: 3.8 10*3/uL (ref 1.7–7.7)
Neutrophils Relative %: 62 %
Platelets: 152 10*3/uL (ref 150–400)
RBC: 4.48 MIL/uL (ref 4.22–5.81)
RDW: 15.2 % (ref 11.5–15.5)
WBC: 6.2 10*3/uL (ref 4.0–10.5)
nRBC: 0 % (ref 0.0–0.2)

## 2021-01-05 MED ORDER — BORTEZOMIB CHEMO SQ INJECTION 3.5 MG (2.5MG/ML)
1.3000 mg/m2 | Freq: Once | INTRAMUSCULAR | Status: AC
  Administered 2021-01-05: 3 mg via SUBCUTANEOUS
  Filled 2021-01-05: qty 1.2

## 2021-01-05 MED ORDER — DEXAMETHASONE 4 MG PO TABS
20.0000 mg | ORAL_TABLET | Freq: Once | ORAL | Status: AC
  Administered 2021-01-05: 20 mg via ORAL
  Filled 2021-01-05: qty 5

## 2021-01-05 NOTE — Progress Notes (Signed)
Nutrition  RD was planning on seeing patient during infusion but only required injection today and patient ready to go.  RD unable to see patient today.  Rhanda Lemire B. Zenia Resides, [place], [place] Registered Dietitian [phone] (mobile)

## 2021-01-05 NOTE — Progress Notes (Signed)
Pt here for labs / velcade. Pt states he still has area of redness on right abdomen from velcade injection 2 weeks ago. States NP evaluated in infusion last week. Advised to monitor. Pt states that it has improved somewhat. No drainage or warmth. MD / NP aware. Pt will continue to monitor and pt has appt with Dr Tasia Catchings in one week.

## 2021-01-12 ENCOUNTER — Inpatient Hospital Stay: Payer: Medicare Other

## 2021-01-12 ENCOUNTER — Other Ambulatory Visit: Payer: Self-pay

## 2021-01-12 ENCOUNTER — Encounter: Payer: Self-pay | Admitting: Oncology

## 2021-01-12 ENCOUNTER — Inpatient Hospital Stay (HOSPITAL_BASED_OUTPATIENT_CLINIC_OR_DEPARTMENT_OTHER): Payer: Medicare Other | Admitting: Oncology

## 2021-01-12 ENCOUNTER — Inpatient Hospital Stay: Payer: Medicare Other | Attending: Oncology

## 2021-01-12 VITALS — BP 99/72 | HR 68 | Temp 98.9°F | Resp 18 | Wt 212.0 lb

## 2021-01-12 DIAGNOSIS — I10 Essential (primary) hypertension: Secondary | ICD-10-CM | POA: Diagnosis not present

## 2021-01-12 DIAGNOSIS — C9000 Multiple myeloma not having achieved remission: Secondary | ICD-10-CM

## 2021-01-12 DIAGNOSIS — F17210 Nicotine dependence, cigarettes, uncomplicated: Secondary | ICD-10-CM | POA: Diagnosis not present

## 2021-01-12 DIAGNOSIS — Z7982 Long term (current) use of aspirin: Secondary | ICD-10-CM | POA: Insufficient documentation

## 2021-01-12 DIAGNOSIS — R911 Solitary pulmonary nodule: Secondary | ICD-10-CM

## 2021-01-12 DIAGNOSIS — R918 Other nonspecific abnormal finding of lung field: Secondary | ICD-10-CM | POA: Diagnosis not present

## 2021-01-12 DIAGNOSIS — Z5111 Encounter for antineoplastic chemotherapy: Secondary | ICD-10-CM

## 2021-01-12 DIAGNOSIS — D649 Anemia, unspecified: Secondary | ICD-10-CM | POA: Diagnosis not present

## 2021-01-12 DIAGNOSIS — E785 Hyperlipidemia, unspecified: Secondary | ICD-10-CM | POA: Insufficient documentation

## 2021-01-12 DIAGNOSIS — E876 Hypokalemia: Secondary | ICD-10-CM | POA: Insufficient documentation

## 2021-01-12 DIAGNOSIS — E291 Testicular hypofunction: Secondary | ICD-10-CM | POA: Insufficient documentation

## 2021-01-12 DIAGNOSIS — I700 Atherosclerosis of aorta: Secondary | ICD-10-CM | POA: Diagnosis not present

## 2021-01-12 DIAGNOSIS — M899 Disorder of bone, unspecified: Secondary | ICD-10-CM

## 2021-01-12 LAB — CBC WITH DIFFERENTIAL/PLATELET
Abs Immature Granulocytes: 0.01 10*3/uL (ref 0.00–0.07)
Basophils Absolute: 0 10*3/uL (ref 0.0–0.1)
Basophils Relative: 0 %
Eosinophils Absolute: 0.2 10*3/uL (ref 0.0–0.5)
Eosinophils Relative: 3 %
HCT: 41.5 % (ref 39.0–52.0)
Hemoglobin: 13.6 g/dL (ref 13.0–17.0)
Immature Granulocytes: 0 %
Lymphocytes Relative: 28 %
Lymphs Abs: 1.3 10*3/uL (ref 0.7–4.0)
MCH: 28.5 pg (ref 26.0–34.0)
MCHC: 32.8 g/dL (ref 30.0–36.0)
MCV: 87 fL (ref 80.0–100.0)
Monocytes Absolute: 0.6 10*3/uL (ref 0.1–1.0)
Monocytes Relative: 14 %
Neutro Abs: 2.5 10*3/uL (ref 1.7–7.7)
Neutrophils Relative %: 55 %
Platelets: 167 10*3/uL (ref 150–400)
RBC: 4.77 MIL/uL (ref 4.22–5.81)
RDW: 15.8 % — ABNORMAL HIGH (ref 11.5–15.5)
WBC: 4.5 10*3/uL (ref 4.0–10.5)
nRBC: 0 % (ref 0.0–0.2)

## 2021-01-12 LAB — COMPREHENSIVE METABOLIC PANEL
ALT: 19 U/L (ref 0–44)
AST: 13 U/L — ABNORMAL LOW (ref 15–41)
Albumin: 3.8 g/dL (ref 3.5–5.0)
Alkaline Phosphatase: 53 U/L (ref 38–126)
Anion gap: 7 (ref 5–15)
BUN: 14 mg/dL (ref 8–23)
CO2: 28 mmol/L (ref 22–32)
Calcium: 8.8 mg/dL — ABNORMAL LOW (ref 8.9–10.3)
Chloride: 102 mmol/L (ref 98–111)
Creatinine, Ser: 0.77 mg/dL (ref 0.61–1.24)
GFR, Estimated: >60 mL/min (ref >60)
Glucose, Bld: 90 mg/dL (ref 70–99)
Potassium: 3.4 mmol/L — ABNORMAL LOW (ref 3.5–5.1)
Sodium: 137 mmol/L (ref 135–145)
Total Bilirubin: 1.2 mg/dL (ref 0.3–1.2)
Total Protein: 7.1 g/dL (ref 6.5–8.1)

## 2021-01-12 MED ORDER — LENALIDOMIDE 25 MG PO CAPS
25.0000 mg | ORAL_CAPSULE | Freq: Every day | ORAL | 0 refills | Status: DC
Start: 2021-01-12 — End: 2021-02-09

## 2021-01-12 MED ORDER — DEXAMETHASONE 4 MG PO TABS
20.0000 mg | ORAL_TABLET | Freq: Once | ORAL | Status: AC
  Administered 2021-01-12: 20 mg via ORAL
  Filled 2021-01-12: qty 5

## 2021-01-12 MED ORDER — BORTEZOMIB CHEMO SQ INJECTION 3.5 MG (2.5MG/ML)
1.3000 mg/m2 | Freq: Once | INTRAMUSCULAR | Status: AC
  Administered 2021-01-12: 3 mg via SUBCUTANEOUS
  Filled 2021-01-12: qty 1.2

## 2021-01-12 NOTE — Progress Notes (Signed)
Nutrition Assessment:  Patient identified on MST for weight loss  65 year old male with multiple myeloma.  Past medical history of HLD, HTN, Vit D deficiency.  Patient has been evaluated at [facility] and planing to resume VRD.    Met with patient during infusion.  Patient reports that appetite is better and has gained weight.  Reports that typically wife prepares meals or goes out to eat.  Drinks ensure in the am with medications and may have an egg sandwich or oatmeal.  Lunch is usually a sandwich. Dinner is meat and couple vegetables.  Denies nausea.  Some constipation.      Medications: metamucil, miralax, senna, prilosec  Labs: K 3.4  Anthropometrics:   Height: 75 inches Weight: 212 lb  270 when first started treatment in Nov 2021 BMI: 26  21% weight loss in the last 11 months  Estimated Energy Needs  Kcals: 2800-3300 Protein: 144-192 g Fluid: 2.8 L  NUTRITION DIAGNOSIS: Inadequate oral intake related to cancer and cancer related treatment side effects as evidenced by 21% weight loss in the last 11 months and appetite decreased at times   INTERVENTION:  Discussed importance of good nutrition and weight maintenance Encouraged well balanced diet including good sources of protein Encouraged 350 calorie shake.  Coupons given Contact information given    MONITORING, EVALUATION, GOAL: weight trends, intake   NEXT VISIT: Tuesday, Oct 25 during infusion  Mirely Pangle B. Zenia Resides, [place], [place] Registered Dietitian [phone] (mobile)

## 2021-01-12 NOTE — Progress Notes (Signed)
Pt here for follow up. No new concerns voiced.   

## 2021-01-12 NOTE — Progress Notes (Signed)
Hematology/Oncology follow up note [facility] Telephone:([phone] Fax:([phone]) [phone]   Patient Care Team: Sofie Hartigan, MD as PCP - General (Family Medicine) Noreene Filbert, MD as Radiation Oncologist (Radiation Oncology)  REFERRING PROVIDER: Sofie Hartigan, MD  CHIEF COMPLAINTS/REASON FOR VISIT:  Follow-up for multiple myeloma HISTORY OF PRESENTING ILLNESS:   Tommy Smith. is a  65 y.o.  male with PMH listed below was seen in consultation at the request of  Sofie Hartigan, MD  for evaluation of abnormal serum protein electrophoresis.  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  Increase calcium level at 12.3, PTH was normal.  Creatinine 1, estimated GFR 91, CBC showed hemoglobin 12.4, Patient also recently had a CT chest abdomen pelvis with contrast for evaluation of right flank pain and right lower lobe nodule. 01/29/2020, CT showed multiple bilateral pulmonary nodules measuring up to 10 mm in the right middle lobe, nodules are indeterminate, infectious/inflammatory etiology versus metastatic disease. Multiple osseous lytic lesions, largest lesion involving the left pubic bone concerning for metastatic disease. Compression fractures at T9, age indeterminate. Appearance focal area of thickening at the gastroesophageal junction.  Further evaluation with upper GI study or direct visualization with endoscopy is recommended No bowel obstruction. Aortic atherosclerosis.  Patient reports NSAIDs as needed for lower back pain.  Mid lower back pain usually is worse when when he stands up from sitting position.  Patient works in the [employer].  Lives at home with wife.  Denies any unintentional weight loss, fever, chills, night sweating  # IgG multiple myeloma, stage II, del 1 p, high risk Baseline M protein 4.1, kappa light chain level 333.8 Beta-2 microglobulin 2.1, albumin 2.7. 02/12/2020, bone marrow  biopsy showed hypercellular for age, 80% plasma cell which are complex restricted by light chain in situ heparinization.  Background hematopoiesis is present but reduced. Cytogenetics is normal, Myeloma FISH panel-deletion 1P,Standard risk. He is not interested in  # 02/27/2020 patient has been started on Revlimid 25 mg D1-14 #02/24/2020- 08/26/2020  Velcade weekly with dexamethasone 20 mg weekly  # 08/14/2020 M protein 0.5, close to 90% reduction from original M protein of 4.1, light chain ration 1.76 # 08/18/2020  Bone marrow results were reviewed.  1% plasma cell.  Normal cytogenetics.  Myeloma FISH negative for 1p del. #09/01/2020, patient agreed for second opinion at [facility] for evaluation and discussion of possible bone marrow transplant. He prefers to hold off maintenance treatment until his [facility] appointment.   #Focal area of thickening at the GE junction. 10/23/2020 EGD is normal.   #Lung nodules, measures up to 10 mm on the right middle lobe.  Indeterminate.  Attention on follow-up.  Recommend repeat CT chest and he declined  11/03/2020, M protein 0.6, lambda free light chain 4.9, kappa light chain 14.8, kappa lambda light chain ratio 3.02. Discussed about proceed with 1 cycle of Revlimid 25 mg 2 weeks on 1 week off while waiting for [facility] second opinion.  He agrees with the plan.  12/09/2020 Evaluated by Dr.Gasparetto at [facility].  Recommend patient to resume VRD.  There was plan of bone marrow biopsy to be repeated at [facility].   INTERVAL HISTORY Tommy Boutelle. is a 65 y.o. male who has above history reviewed by me today presents for follow up visit for multiple myeloma  Patient reports feeling well.   Denies any numbness or tingling.  Appetite is fair.  Patient has gained weight. Other than some focal soreness at the site of  Velcade injection, he has no new complaints.   Review of Systems  Constitutional:  Negative for appetite change, chills, fatigue, fever and unexpected weight change.   HENT:   Negative for hearing loss and voice change.   Eyes:  Negative for eye problems and icterus.  Respiratory:  Negative for chest tightness, cough and shortness of breath.   Cardiovascular:  Negative for chest pain and leg swelling.  Gastrointestinal:  Negative for abdominal distention, abdominal pain and constipation.  Endocrine: Negative for hot flashes.  Genitourinary:  Negative for difficulty urinating, dysuria and frequency.   Musculoskeletal:  Negative for arthralgias, back pain and neck pain.  Skin:  Negative for itching and rash.  Neurological:  Negative for light-headedness and numbness.  Hematological:  Negative for adenopathy. Does not bruise/bleed easily.  Psychiatric/Behavioral:  Negative for confusion.     MEDICAL HISTORY:  Past Medical History:  Diagnosis Date   Cancer ([place])    Depression    Hypercalcemia 03/02/2020   Hyperlipemia    Hypertension    Hypogonadism in male    Multiple myeloma [facility])     SURGICAL HISTORY: Past Surgical History:  Procedure Laterality Date   BONE MARROW BIOPSY     COLONOSCOPY  07/14/2008   ESOPHAGOGASTRODUODENOSCOPY N/A 10/22/2020   Procedure: ESOPHAGOGASTRODUODENOSCOPY (EGD);  Surgeon: Jonathon Bellows, MD;  Location[facility] ENDOSCOPY;  Service: Gastroenterology;  Laterality: N/A;    SOCIAL HISTORY: Social History   Socioeconomic History   Marital status: Married    Spouse name: Not on file   Number of children: Not on file   Years of education: Not on file   Highest education level: Not on file  Occupational History   Not on file  Tobacco Use   Smoking status: Every Day    Packs/day: 1.00    Years: 30.00    Pack years: 30.00    Types: Cigarettes   Smokeless tobacco: Never  Substance and Sexual Activity   Alcohol use: Yes    Comment: occcassional   Drug use: No   Sexual activity: Not on file  Other Topics Concern   Not on file  Social History Narrative   Not on file   Social Determinants of Health   Financial  Resource Strain: Not on file  Food Insecurity: Not on file  Transportation Needs: Not on file  Physical Activity: Not on file  Stress: Not on file  Social Connections: Not on file  Intimate Partner Violence: Not on file    FAMILY HISTORY: Family History  Problem Relation Age of Onset   Skin cancer Mother    Prostate cancer Father     ALLERGIES:  has No Known Allergies.  MEDICATIONS:  Current Outpatient Medications  Medication Sig Dispense Refill   acyclovir (ZOVIRAX) 400 MG tablet TAKE 1 TABLET BY MOUTH TWICE A DAY 180 tablet 1   amLODipine (NORVASC) 10 MG tablet Take by mouth daily.     ASPIRIN 81 PO Take 81 mg by mouth daily.     atorvastatin (LIPITOR) 40 MG tablet Take 40 mg by mouth daily.     buPROPion (WELLBUTRIN XL) 300 MG 24 hr tablet Take 300 mg by mouth daily.     KLOR-CON M20 20 MEQ tablet TAKE 1 TABLET BY MOUTH TWICE A DAY 180 tablet 1   losartan-hydrochlorothiazide (HYZAAR) 100-12.5 MG tablet Take 1 tablet by mouth daily.     omeprazole (PRILOSEC) 20 MG capsule Take 1 capsule (20 mg total) by mouth daily. 30 capsule 1  polyethylene glycol (MIRALAX) 17 g packet Take 17 g by mouth daily. 14 each 0   Psyllium (METAMUCIL MULTIHEALTH FIBER PO) Take by mouth daily.     senna-docusate (SENNA S) 8.6-50 MG tablet Take 2 tablets by mouth daily. 60 tablet 3   lenalidomide (REVLIMID) 25 MG capsule Take 1 capsule (25 mg total) by mouth daily. Take 14 days on, 7 days off, repeat every 21 days. 14 capsule 0   No current facility-administered medications for this visit.     PHYSICAL EXAMINATION: ECOG PERFORMANCE STATUS: 1 - Symptomatic but completely ambulatory Vitals:   01/12/21 1318  BP: 99/72  Pulse: 68  Resp: 18  Temp: 98.9 F (37.2 C)   Filed Weights   01/12/21 1318  Weight: 212 lb (96.2 kg)    Physical Exam Constitutional:      General: He is not in acute distress.    Appearance: He is not diaphoretic.  HENT:     Head: Normocephalic and atraumatic.      Nose: Nose normal.     Mouth/Throat:     Pharynx: No oropharyngeal exudate.  Eyes:     General: No scleral icterus.    Pupils: Pupils are equal, round, and reactive to light.  Cardiovascular:     Rate and Rhythm: Normal rate and regular rhythm.     Heart sounds: No murmur heard. Pulmonary:     Effort: Pulmonary effort is normal. No respiratory distress.     Breath sounds: No rales.  Chest:     Chest wall: No tenderness.  Abdominal:     General: There is no distension.     Palpations: Abdomen is soft.     Tenderness: There is no abdominal tenderness.  Musculoskeletal:        General: Normal range of motion.     Cervical back: Normal range of motion and neck supple.  Skin:    General: Skin is warm and dry.     Findings: No erythema.  Neurological:     Mental Status: He is alert and oriented to person, place, and time.     Cranial Nerves: No cranial nerve deficit.     Motor: No abnormal muscle tone.     Coordination: Coordination normal.  Psychiatric:        Mood and Affect: Affect normal.      LABORATORY DATA:  I have reviewed the data as listed Lab Results  Component Value Date   WBC 4.5 01/12/2021   HGB 13.6 01/12/2021   HCT 41.5 01/12/2021   MCV 87.0 01/12/2021   PLT 167 01/12/2021   Recent Labs    12/29/20 1245 01/05/21 1312 01/12/21 1254  NA 137 137 137  K 3.5 3.7 3.4*  CL 101 102 102  CO2 31 29 28   GLUCOSE 89 88 90  BUN 13 13 14   CREATININE 0.75 0.75 0.77  CALCIUM 9.2 8.9 8.8*  GFRNONAA >60 >60 >60  PROT 6.5 6.3* 7.1  ALBUMIN 3.6 3.4* 3.8  AST 12* 13* 13*  ALT 31 26 19   ALKPHOS 51 52 53  BILITOT 1.0 1.0 1.2    Iron/TIBC/Ferritin/ %Sat No results found for: IRON, TIBC, FERRITIN, IRONPCTSAT    RADIOGRAPHIC STUDIES: I have personally reviewed the radiological images as listed and agreed with the findings in the report. No results found.     ASSESSMENT & PLAN:  1. Encounter for antineoplastic chemotherapy   2. Multiple myeloma not having  achieved remission ([place])   3. Hypokalemia  4. Lytic bone lesions on xray   5. Lung nodule   6. Normocytic anemia   Cancer Staging Multiple myeloma not having achieved remission ([place]) Staging form: Plasma Cell Myeloma and Plasma Cell Disorders, AJCC 8th Edition - Clinical stage from 02/04/2020: Beta-2-microglobulin (mg/L): 2.1, Albumin (g/dL): 2.7, ISS: Stage II, High-risk cytogenetics: Absent, LDH: Unknown - Signed by Earlie Server, MD on 05/06/2020   #IgG multiple myeloma, stage II, del 1 p, high risk.  Declined bone marrow transplant evaluation. 1st treatment with RVD, BM biopsy after 8 cycles showed 1% plasma cells.  Labs reviewed and discussed with patient. Resume and proceed with cycle 10 VRD. Velcade and dexamethasone weekly. Patient takes Revlimid 25 mg 2 weeks on 1 week off Continue aspirin 81 mg daily for DVT prophylaxis Continue acyclovir Recent multiple myeloma showed increase of M protein likely due to the treatment interruption.  #Bone lesion, Last dose zometa 08/07/2020.  Plan Zometa at next visit. #Hypocalcemia, mild.  recommend calcium 1200 mg daily.  # hypokalemia, potassium stable at 3.4.  continue potassium chloride 20 mEq daily. #Lung nodules,declined CT chest  We spent sufficient time to discuss many aspect of care, questions were answered to patient's satisfaction. All questions were answered. The patient knows to call the clinic with any problems questions or concerns.  cc Sofie Hartigan, MD   Return of visit:   Weekly Velcade and Dex x 2 Lab MD Velcade and Dex in 3 weeks.  + Zometa  Earlie Server, MD, PhD  01/12/2021

## 2021-01-13 LAB — KAPPA/LAMBDA LIGHT CHAINS
Kappa free light chain: 15.6 mg/L (ref 3.3–19.4)
Kappa, lambda light chain ratio: 1.61 (ref 0.26–1.65)
Lambda free light chains: 9.7 mg/L (ref 5.7–26.3)

## 2021-01-14 LAB — PROTEIN ELECTROPHORESIS, SERUM
A/G Ratio: 1.3 (ref 0.7–1.7)
Albumin ELP: 3.4 g/dL (ref 2.9–4.4)
Alpha-1-Globulin: 0.2 g/dL (ref 0.0–0.4)
Alpha-2-Globulin: 0.7 g/dL (ref 0.4–1.0)
Beta Globulin: 1.3 g/dL (ref 0.7–1.3)
Gamma Globulin: 0.3 g/dL — ABNORMAL LOW (ref 0.4–1.8)
Globulin, Total: 2.6 g/dL (ref 2.2–3.9)
M-Spike, %: 0.6 g/dL — ABNORMAL HIGH
Total Protein ELP: 6 g/dL (ref 6.0–8.5)

## 2021-01-14 LAB — IGG: IgG (Immunoglobin G), Serum: 820 mg/dL (ref 603–1613)

## 2021-01-19 ENCOUNTER — Other Ambulatory Visit: Payer: Self-pay

## 2021-01-19 ENCOUNTER — Inpatient Hospital Stay: Payer: Medicare Other

## 2021-01-19 VITALS — BP 100/67 | HR 68 | Temp 98.0°F | Resp 16 | Wt 212.4 lb

## 2021-01-19 DIAGNOSIS — C9000 Multiple myeloma not having achieved remission: Secondary | ICD-10-CM

## 2021-01-19 LAB — COMPREHENSIVE METABOLIC PANEL
ALT: 22 U/L (ref 0–44)
AST: 17 U/L (ref 15–41)
Albumin: 3.3 g/dL — ABNORMAL LOW (ref 3.5–5.0)
Alkaline Phosphatase: 48 U/L (ref 38–126)
Anion gap: 8 (ref 5–15)
BUN: 12 mg/dL (ref 8–23)
CO2: 27 mmol/L (ref 22–32)
Calcium: 8.6 mg/dL — ABNORMAL LOW (ref 8.9–10.3)
Chloride: 100 mmol/L (ref 98–111)
Creatinine, Ser: 0.85 mg/dL (ref 0.61–1.24)
GFR, Estimated: >60 mL/min (ref >60)
Glucose, Bld: 94 mg/dL (ref 70–99)
Potassium: 3.5 mmol/L (ref 3.5–5.1)
Sodium: 135 mmol/L (ref 135–145)
Total Bilirubin: 0.7 mg/dL (ref 0.3–1.2)
Total Protein: 6 g/dL — ABNORMAL LOW (ref 6.5–8.1)

## 2021-01-19 LAB — CBC WITH DIFFERENTIAL/PLATELET
Abs Immature Granulocytes: 0.04 10*3/uL (ref 0.00–0.07)
Basophils Absolute: 0 10*3/uL (ref 0.0–0.1)
Basophils Relative: 0 %
Eosinophils Absolute: 0.4 10*3/uL (ref 0.0–0.5)
Eosinophils Relative: 8 %
HCT: 38.8 % — ABNORMAL LOW (ref 39.0–52.0)
Hemoglobin: 12.6 g/dL — ABNORMAL LOW (ref 13.0–17.0)
Immature Granulocytes: 1 %
Lymphocytes Relative: 22 %
Lymphs Abs: 1.1 10*3/uL (ref 0.7–4.0)
MCH: 28.1 pg (ref 26.0–34.0)
MCHC: 32.5 g/dL (ref 30.0–36.0)
MCV: 86.4 fL (ref 80.0–100.0)
Monocytes Absolute: 0.6 10*3/uL (ref 0.1–1.0)
Monocytes Relative: 11 %
Neutro Abs: 2.9 10*3/uL (ref 1.7–7.7)
Neutrophils Relative %: 58 %
Platelets: 179 10*3/uL (ref 150–400)
RBC: 4.49 MIL/uL (ref 4.22–5.81)
RDW: 16.1 % — ABNORMAL HIGH (ref 11.5–15.5)
WBC: 5 10*3/uL (ref 4.0–10.5)
nRBC: 0 % (ref 0.0–0.2)

## 2021-01-19 MED ORDER — BORTEZOMIB CHEMO SQ INJECTION 3.5 MG (2.5MG/ML)
1.3000 mg/m2 | Freq: Once | INTRAMUSCULAR | Status: AC
  Administered 2021-01-19: 3 mg via SUBCUTANEOUS
  Filled 2021-01-19: qty 1.2

## 2021-01-19 MED ORDER — DEXAMETHASONE 4 MG PO TABS
20.0000 mg | ORAL_TABLET | Freq: Once | ORAL | Status: AC
  Administered 2021-01-19: 20 mg via ORAL
  Filled 2021-01-19: qty 5

## 2021-01-19 NOTE — Patient Instructions (Signed)
Bortezomib injection What is this medication? BORTEZOMIB (bor TEZ oh mib) targets proteins in cancer cells and stops thecancer cells from growing. It treats multiple myeloma and mantle cell lymphoma. This medicine may be used for other purposes; ask your health care provider orpharmacist if you have questions. COMMON BRAND NAME(S): Velcade What should I tell my care team before I take this medication? They need to know if you have any of these conditions: dehydration diabetes (high blood sugar) heart disease liver disease tingling of the fingers or toes or other nerve disorder an unusual or allergic reaction to bortezomib, mannitol, boron, other medicines, foods, dyes, or preservatives pregnant or trying to get pregnant breast-feeding How should I use this medication? This medicine is injected into a vein or under the skin. It is given by ahealth care provider in a hospital or clinic setting. Talk to your health care provider about the use of this medicine in children.Special care may be needed. Overdosage: If you think you have taken too much of this medicine contact apoison control center or emergency room at once. NOTE: This medicine is only for you. Do not share this medicine with others. What if I miss a dose? Keep appointments for follow-up doses. It is important not to miss your dose.Call your health care provider if you are unable to keep an appointment. What may interact with this medication? This medicine may interact with the following medications: ketoconazole rifampin This list may not describe all possible interactions. Give your health care provider a list of all the medicines, herbs, non-prescription drugs, or dietary supplements you use. Also tell them if you smoke, drink alcohol, or use illegaldrugs. Some items may interact with your medicine. What should I watch for while using this medication? Your condition will be monitored carefully while you are receiving  thismedicine. You may need blood work done while you are taking this medicine. You may get drowsy or dizzy. Do not drive, use machinery, or do anything that needs mental alertness until you know how this medicine affects you. Do not stand up or sit up quickly, especially if you are an older patient. Thisreduces the risk of dizzy or fainting spells This medicine may increase your risk of getting an infection. Call your health care provider for advice if you get a fever, chills, sore throat, or other symptoms of a cold or flu. Do not treat yourself. Try to avoid being aroundpeople who are sick. Check with your health care provider if you have severe diarrhea, nausea, and vomiting, or if you sweat a lot. The loss of too much body fluid may make itdangerous for you to take this medicine. Do not become pregnant while taking this medicine or for 7 months after stopping it. Women should inform their health care provider if they wish to become pregnant or think they might be pregnant. Men should not father a child while taking this medicine and for 4 months after stopping it. There is a potential for serious harm to an unborn child. Talk to your health care provider for more information. Do not breast-feed an infant while taking thismedicine or for 2 months after stopping it. This medicine may make it more difficult to get pregnant or father a child.Talk to your health care provider if you are concerned about your fertility. What side effects may I notice from receiving this medication? Side effects that you should report to your doctor or health care professionalas soon as possible: allergic reactions (skin rash; itching or hives; swelling   of the face, lips, or tongue) bleeding (bloody or black, tarry stools; red or dark brown urine; spitting up blood or brown material that looks like coffee grounds; red spots on the skin; unusual bruising or bleeding from the eye, gums, or nose) blurred vision or changes in  vision confusion constipation headache heart failure (trouble breathing; fast, irregular heartbeat; sudden weight gain; swelling of the ankles, feet, hands) infection (fever, chills, cough, sore throat, pain or trouble passing urine) lack or loss of appetite liver injury (dark yellow or brown urine; general ill feeling or flu-like symptoms; loss of appetite, right upper belly pain; yellowing of the eyes or skin) low blood pressure (dizziness; feeling faint or lightheaded, falls; unusually weak or tired) muscle cramps pain, redness, or irritation at site where injected pain, tingling, numbness in the hands or feet seizures trouble breathing unusual bruising or bleeding Side effects that usually do not require medical attention (report to yourdoctor or health care professional if they continue or are bothersome): diarrhea nausea stomach pain trouble sleeping vomiting This list may not describe all possible side effects. Call your doctor for medical advice about side effects. You may report side effects to FDA at1-800-FDA-1088. Where should I keep my medication? This medicine is given in a hospital or clinic. It will not be stored at home. NOTE: This sheet is a summary. It may not cover all possible information. If you have questions about this medicine, talk to your doctor, pharmacist, orhealth care provider.  2022 Elsevier/Gold Standard (2020-03-19 13:22:53)  

## 2021-01-26 ENCOUNTER — Other Ambulatory Visit: Payer: Self-pay

## 2021-01-26 ENCOUNTER — Inpatient Hospital Stay: Payer: Medicare Other

## 2021-01-26 VITALS — BP 112/71 | HR 70 | Temp 96.0°F | Resp 17 | Wt 218.4 lb

## 2021-01-26 DIAGNOSIS — C9000 Multiple myeloma not having achieved remission: Secondary | ICD-10-CM | POA: Diagnosis not present

## 2021-01-26 LAB — CBC WITH DIFFERENTIAL/PLATELET
Abs Immature Granulocytes: 0.04 10*3/uL (ref 0.00–0.07)
Basophils Absolute: 0 10*3/uL (ref 0.0–0.1)
Basophils Relative: 0 %
Eosinophils Absolute: 0.4 10*3/uL (ref 0.0–0.5)
Eosinophils Relative: 7 %
HCT: 39.7 % (ref 39.0–52.0)
Hemoglobin: 12.9 g/dL — ABNORMAL LOW (ref 13.0–17.0)
Immature Granulocytes: 1 %
Lymphocytes Relative: 19 %
Lymphs Abs: 1.1 10*3/uL (ref 0.7–4.0)
MCH: 28.4 pg (ref 26.0–34.0)
MCHC: 32.5 g/dL (ref 30.0–36.0)
MCV: 87.4 fL (ref 80.0–100.0)
Monocytes Absolute: 1.1 10*3/uL — ABNORMAL HIGH (ref 0.1–1.0)
Monocytes Relative: 18 %
Neutro Abs: 3.3 10*3/uL (ref 1.7–7.7)
Neutrophils Relative %: 55 %
Platelets: 201 10*3/uL (ref 150–400)
RBC: 4.54 MIL/uL (ref 4.22–5.81)
RDW: 16.8 % — ABNORMAL HIGH (ref 11.5–15.5)
WBC: 5.9 10*3/uL (ref 4.0–10.5)
nRBC: 0 % (ref 0.0–0.2)

## 2021-01-26 LAB — COMPREHENSIVE METABOLIC PANEL
ALT: 19 U/L (ref 0–44)
AST: 11 U/L — ABNORMAL LOW (ref 15–41)
Albumin: 3.4 g/dL — ABNORMAL LOW (ref 3.5–5.0)
Alkaline Phosphatase: 51 U/L (ref 38–126)
Anion gap: 5 (ref 5–15)
BUN: 13 mg/dL (ref 8–23)
CO2: 31 mmol/L (ref 22–32)
Calcium: 8.7 mg/dL — ABNORMAL LOW (ref 8.9–10.3)
Chloride: 103 mmol/L (ref 98–111)
Creatinine, Ser: 0.83 mg/dL (ref 0.61–1.24)
GFR, Estimated: >60 mL/min (ref >60)
Glucose, Bld: 88 mg/dL (ref 70–99)
Potassium: 3.7 mmol/L (ref 3.5–5.1)
Sodium: 139 mmol/L (ref 135–145)
Total Bilirubin: 1.4 mg/dL — ABNORMAL HIGH (ref 0.3–1.2)
Total Protein: 6.2 g/dL — ABNORMAL LOW (ref 6.5–8.1)

## 2021-01-26 LAB — BILIRUBIN, DIRECT: Bilirubin, Direct: 0.3 mg/dL — ABNORMAL HIGH (ref 0.0–0.2)

## 2021-01-26 MED ORDER — BORTEZOMIB CHEMO SQ INJECTION 3.5 MG (2.5MG/ML)
1.3000 mg/m2 | Freq: Once | INTRAMUSCULAR | Status: AC
  Administered 2021-01-26: 3 mg via SUBCUTANEOUS
  Filled 2021-01-26: qty 1.2

## 2021-01-26 MED ORDER — DEXAMETHASONE 4 MG PO TABS
20.0000 mg | ORAL_TABLET | Freq: Once | ORAL | Status: AC
  Administered 2021-01-26: 20 mg via ORAL
  Filled 2021-01-26: qty 5

## 2021-01-26 NOTE — Patient Instructions (Signed)
CANCER CENTER [place] [facility]  Discharge Instructions: Thank you for choosing [facility] to provide your oncology and hematology care.  If you have a lab appointment with the Cancer Center, please go directly to the Cancer Center and check in at the registration area.  Wear comfortable clothing and clothing appropriate for easy access to any Portacath or PICC line.   We strive to give you quality time with your provider. You may need to reschedule your appointment if you arrive late (15 or more minutes).  Arriving late affects you and other patients whose appointments are after yours.  Also, if you miss three or more appointments without notifying the office, you may be dismissed from the clinic at the provider's discretion.      For prescription refill requests, have your pharmacy contact our office and allow 72 hours for refills to be completed.    Today you received the following chemotherapy and/or immunotherapy agents Velcade      To help prevent nausea and vomiting after your treatment, we encourage you to take your nausea medication as directed.  BELOW ARE SYMPTOMS THAT SHOULD BE REPORTED IMMEDIATELY: *FEVER GREATER THAN 100.4 F (38 C) OR HIGHER *CHILLS OR SWEATING *NAUSEA AND VOMITING THAT IS NOT CONTROLLED WITH YOUR NAUSEA MEDICATION *UNUSUAL SHORTNESS OF BREATH *UNUSUAL BRUISING OR BLEEDING *URINARY PROBLEMS (pain or burning when urinating, or frequent urination) *BOWEL PROBLEMS (unusual diarrhea, constipation, pain near the anus) TENDERNESS IN MOUTH AND THROAT WITH OR WITHOUT PRESENCE OF ULCERS (sore throat, sores in mouth, or a toothache) UNUSUAL RASH, SWELLING OR PAIN  UNUSUAL VAGINAL DISCHARGE OR ITCHING   Items with * indicate a potential emergency and should be followed up as soon as possible or go to the Emergency Department if any problems should occur.  Please show the CHEMOTHERAPY ALERT CARD or IMMUNOTHERAPY ALERT CARD at check-in to  the Emergency Department and triage nurse.  Should you have questions after your visit or need to cancel or reschedule your appointment, please contact CANCER CENTER [place] [facility]  [phone] and follow the prompts.  Office hours are 8:00 a.m. to 4:30 p.m. Monday - Friday. Please note that voicemails left after 4:00 p.m. may not be returned until the following business day.  We are closed weekends and major holidays. You have access to a nurse at all times for urgent questions. Please call the main number to the clinic [phone] and follow the prompts.  For any non-urgent questions, you may also contact your provider using MyChart. We now offer e-Visits for anyone 18 and older to request care online for non-urgent symptoms. For details visit [email or web address].   Also download the MyChart app! Go to the app store, search "MyChart", open the app, select [place], and log in with your MyChart username and password.  Due to Covid, a mask is required upon entering the hospital/clinic. If you do not have a mask, one will be given to you upon arrival. For doctor visits, patients may have 1 support person aged 18 or older with them. For treatment visits, patients cannot have anyone with them due to current Covid guidelines and our immunocompromised population.  

## 2021-01-26 NOTE — Progress Notes (Signed)
Bilirubin 1.4 (01/19/21 Bilirubin 0.7). Dr. Tasia Catchings aware, Per Dr. Tasia Catchings proceed with Velcade and direct Bilirubin level to be checked.  Pt tolerated treatment well and stable at discharge.

## 2021-02-01 ENCOUNTER — Other Ambulatory Visit: Payer: Medicare Other

## 2021-02-02 ENCOUNTER — Other Ambulatory Visit: Payer: Self-pay

## 2021-02-02 ENCOUNTER — Encounter: Payer: Self-pay | Admitting: Oncology

## 2021-02-02 ENCOUNTER — Other Ambulatory Visit: Payer: Medicare Other

## 2021-02-02 ENCOUNTER — Inpatient Hospital Stay: Payer: Medicare Other

## 2021-02-02 ENCOUNTER — Inpatient Hospital Stay (HOSPITAL_BASED_OUTPATIENT_CLINIC_OR_DEPARTMENT_OTHER): Payer: Medicare Other | Admitting: Oncology

## 2021-02-02 VITALS — BP 117/78 | HR 69 | Temp 97.0°F | Wt 215.5 lb

## 2021-02-02 DIAGNOSIS — E876 Hypokalemia: Secondary | ICD-10-CM

## 2021-02-02 DIAGNOSIS — M899 Disorder of bone, unspecified: Secondary | ICD-10-CM | POA: Diagnosis not present

## 2021-02-02 DIAGNOSIS — C9000 Multiple myeloma not having achieved remission: Secondary | ICD-10-CM

## 2021-02-02 DIAGNOSIS — R911 Solitary pulmonary nodule: Secondary | ICD-10-CM | POA: Diagnosis not present

## 2021-02-02 DIAGNOSIS — Z5111 Encounter for antineoplastic chemotherapy: Secondary | ICD-10-CM

## 2021-02-02 LAB — CBC WITH DIFFERENTIAL/PLATELET
Abs Immature Granulocytes: 0 10*3/uL (ref 0.00–0.07)
Basophils Absolute: 0 10*3/uL (ref 0.0–0.1)
Basophils Relative: 0 %
Eosinophils Absolute: 0.2 10*3/uL (ref 0.0–0.5)
Eosinophils Relative: 5 %
HCT: 39.2 % (ref 39.0–52.0)
Hemoglobin: 12.9 g/dL — ABNORMAL LOW (ref 13.0–17.0)
Immature Granulocytes: 0 %
Lymphocytes Relative: 24 %
Lymphs Abs: 1.2 10*3/uL (ref 0.7–4.0)
MCH: 28.5 pg (ref 26.0–34.0)
MCHC: 32.9 g/dL (ref 30.0–36.0)
MCV: 86.5 fL (ref 80.0–100.0)
Monocytes Absolute: 0.8 10*3/uL (ref 0.1–1.0)
Monocytes Relative: 17 %
Neutro Abs: 2.7 10*3/uL (ref 1.7–7.7)
Neutrophils Relative %: 54 %
Platelets: 200 10*3/uL (ref 150–400)
RBC: 4.53 MIL/uL (ref 4.22–5.81)
RDW: 16.8 % — ABNORMAL HIGH (ref 11.5–15.5)
WBC: 4.9 10*3/uL (ref 4.0–10.5)
nRBC: 0 % (ref 0.0–0.2)

## 2021-02-02 LAB — COMPREHENSIVE METABOLIC PANEL
ALT: 29 U/L (ref 0–44)
AST: 14 U/L — ABNORMAL LOW (ref 15–41)
Albumin: 3.4 g/dL — ABNORMAL LOW (ref 3.5–5.0)
Alkaline Phosphatase: 50 U/L (ref 38–126)
Anion gap: 3 — ABNORMAL LOW (ref 5–15)
BUN: 12 mg/dL (ref 8–23)
CO2: 28 mmol/L (ref 22–32)
Calcium: 8.6 mg/dL — ABNORMAL LOW (ref 8.9–10.3)
Chloride: 106 mmol/L (ref 98–111)
Creatinine, Ser: 0.81 mg/dL (ref 0.61–1.24)
GFR, Estimated: >60 mL/min (ref >60)
Glucose, Bld: 86 mg/dL (ref 70–99)
Potassium: 3.5 mmol/L (ref 3.5–5.1)
Sodium: 137 mmol/L (ref 135–145)
Total Bilirubin: 1.5 mg/dL — ABNORMAL HIGH (ref 0.3–1.2)
Total Protein: 6.1 g/dL — ABNORMAL LOW (ref 6.5–8.1)

## 2021-02-02 MED ORDER — BORTEZOMIB CHEMO SQ INJECTION 3.5 MG (2.5MG/ML)
1.3000 mg/m2 | Freq: Once | INTRAMUSCULAR | Status: AC
  Administered 2021-02-02: 3 mg via SUBCUTANEOUS
  Filled 2021-02-02: qty 1.2

## 2021-02-02 MED ORDER — DEXAMETHASONE 4 MG PO TABS
20.0000 mg | ORAL_TABLET | Freq: Once | ORAL | Status: AC
  Administered 2021-02-02: 20 mg via ORAL
  Filled 2021-02-02: qty 5

## 2021-02-02 NOTE — Progress Notes (Signed)
Labs reviewed with MD. Per MD to proceed with treatment.  ? ?Tommy Smith  ?

## 2021-02-02 NOTE — Progress Notes (Signed)
Hematology/Oncology follow up note [facility] Telephone:(336) [phone] Fax:(336) [phone]   Patient Care Team: Sofie Hartigan, MD as PCP - General (Family Medicine) Noreene Filbert, MD as Radiation Oncologist (Radiation Oncology)  REFERRING PROVIDER: Sofie Hartigan, MD  CHIEF COMPLAINTS/REASON FOR VISIT:  Follow-up for multiple myeloma HISTORY OF PRESENTING ILLNESS:   Tommy Smith. is a  65 y.o.  male with PMH listed below was seen in consultation at the request of  Sofie Hartigan, MD  for evaluation of abnormal serum protein electrophoresis.  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  Increase calcium level at 12.3, PTH was normal.  Creatinine 1, estimated GFR 91, CBC showed hemoglobin 12.4, Patient also recently had a CT chest abdomen pelvis with contrast for evaluation of right flank pain and right lower lobe nodule. 01/29/2020, CT showed multiple bilateral pulmonary nodules measuring up to 10 mm in the right middle lobe, nodules are indeterminate, infectious/inflammatory etiology versus metastatic disease. Multiple osseous lytic lesions, largest lesion involving the left pubic bone concerning for metastatic disease. Compression fractures at T9, age indeterminate. Appearance focal area of thickening at the gastroesophageal junction.  Further evaluation with upper GI study or direct visualization with endoscopy is recommended No bowel obstruction. Aortic atherosclerosis.  Patient reports NSAIDs as needed for lower back pain.  Mid lower back pain usually is worse when when he stands up from sitting position.  Patient works in the [employer].  Lives at home with wife.  Denies any unintentional weight loss, fever, chills, night sweating  # IgG multiple myeloma, stage II, del 1 p, high risk Baseline M protein 4.1, kappa light chain level 333.8 Beta-2 microglobulin 2.1, albumin 2.7. 02/12/2020, bone marrow  biopsy showed hypercellular for age, 80% plasma cell which are complex restricted by light chain in situ heparinization.  Background hematopoiesis is present but reduced. Cytogenetics is normal, Myeloma FISH panel-deletion 1P,Standard risk. He is not interested in  # 02/27/2020 patient has been started on Revlimid 25 mg D1-14 #02/24/2020- 08/26/2020  Velcade weekly with dexamethasone 20 mg weekly  # 08/14/2020 M protein 0.5, close to 90% reduction from original M protein of 4.1, light chain ration 1.76 # 08/18/2020  Bone marrow results were reviewed.  1% plasma cell.  Normal cytogenetics.  Myeloma FISH negative for 1p del. #09/01/2020, patient agreed for second opinion at [facility] for evaluation and discussion of possible bone marrow transplant. He prefers to hold off maintenance treatment until his [place] appointment.   #Focal area of thickening at the GE junction. 10/23/2020 EGD is normal.   #Lung nodules, measures up to 10 mm on the right middle lobe.  Indeterminate.  Attention on follow-up.  Recommend repeat CT chest and he declined  11/03/2020, M protein 0.6, lambda free light chain 4.9, kappa light chain 14.8, kappa lambda light chain ratio 3.02. Discussed about proceed with 1 cycle of Revlimid 25 mg 2 weeks on 1 week off while waiting for [facility] second opinion.  He agrees with the plan.  12/09/2020 Evaluated by Dr.Gasparetto at [facility].  Recommend patient to resume VRD.  There was plan of bone marrow biopsy to be repeated at [facility].   INTERVAL HISTORY Tommy Smith. is a 65 y.o. male who has above history reviewed by me today presents for follow up visit for multiple myeloma He reports feeling well.  He has intermittent mild numbness and tingling.    Review of Systems  Constitutional:  Negative for appetite change, chills, fatigue, fever and unexpected  weight change.  HENT:   Negative for hearing loss and voice change.   Eyes:  Negative for eye problems and icterus.  Respiratory:  Negative for  chest tightness, cough and shortness of breath.   Cardiovascular:  Negative for chest pain and leg swelling.  Gastrointestinal:  Negative for abdominal distention, abdominal pain and constipation.  Endocrine: Negative for hot flashes.  Genitourinary:  Negative for difficulty urinating, dysuria and frequency.   Musculoskeletal:  Negative for arthralgias, back pain and neck pain.  Skin:  Negative for itching and rash.  Neurological:  Positive for numbness. Negative for light-headedness.  Hematological:  Negative for adenopathy. Does not bruise/bleed easily.  Psychiatric/Behavioral:  Negative for confusion.     MEDICAL HISTORY:  Past Medical History:  Diagnosis Date   Cancer ([place])    Depression    Hypercalcemia 03/02/2020   Hyperlipemia    Hypertension    Hypogonadism in male    Multiple myeloma [facility])     SURGICAL HISTORY: Past Surgical History:  Procedure Laterality Date   BONE MARROW BIOPSY     COLONOSCOPY  07/14/2008   ESOPHAGOGASTRODUODENOSCOPY N/A 10/22/2020   Procedure: ESOPHAGOGASTRODUODENOSCOPY (EGD);  Surgeon: Jonathon Bellows, MD;  Location: [facility] ENDOSCOPY;  Service: Gastroenterology;  Laterality: N/A;    SOCIAL HISTORY: Social History   Socioeconomic History   Marital status: Married    Spouse name: Not on file   Number of children: Not on file   Years of education: Not on file   Highest education level: Not on file  Occupational History   Not on file  Tobacco Use   Smoking status: Every Day    Packs/day: 1.00    Years: 30.00    Pack years: 30.00    Types: Cigarettes   Smokeless tobacco: Never  Substance and Sexual Activity   Alcohol use: Yes    Comment: occcassional   Drug use: No   Sexual activity: Not on file  Other Topics Concern   Not on file  Social History Narrative   Not on file   Social Determinants of Health   Financial Resource Strain: Not on file  Food Insecurity: Not on file  Transportation Needs: Not on file  Physical Activity: Not on  file  Stress: Not on file  Social Connections: Not on file  Intimate Partner Violence: Not on file    FAMILY HISTORY: Family History  Problem Relation Age of Onset   Skin cancer Mother    Prostate cancer Father     ALLERGIES:  has No Known Allergies.  MEDICATIONS:  Current Outpatient Medications  Medication Sig Dispense Refill   acyclovir (ZOVIRAX) 400 MG tablet TAKE 1 TABLET BY MOUTH TWICE A DAY 180 tablet 1   ASPIRIN 81 PO Take 81 mg by mouth daily.     atorvastatin (LIPITOR) 40 MG tablet Take 40 mg by mouth daily.     buPROPion (WELLBUTRIN XL) 300 MG 24 hr tablet Take 300 mg by mouth daily.     KLOR-CON M20 20 MEQ tablet TAKE 1 TABLET BY MOUTH TWICE A DAY 180 tablet 1   lenalidomide (REVLIMID) 25 MG capsule Take 1 capsule (25 mg total) by mouth daily. Take 14 days on, 7 days off, repeat every 21 days. 14 capsule 0   losartan-hydrochlorothiazide (HYZAAR) 100-12.5 MG tablet Take 1 tablet by mouth daily.     omeprazole (PRILOSEC) 20 MG capsule Take 1 capsule (20 mg total) by mouth daily. 30 capsule 1   polyethylene glycol (MIRALAX) 17 g packet  Take 17 g by mouth daily. 14 each 0   Psyllium (METAMUCIL MULTIHEALTH FIBER PO) Take by mouth daily.     senna-docusate (SENNA S) 8.6-50 MG tablet Take 2 tablets by mouth daily. 60 tablet 3   amLODipine (NORVASC) 10 MG tablet Take by mouth daily.     No current facility-administered medications for this visit.     PHYSICAL EXAMINATION: ECOG PERFORMANCE STATUS: 1 - Symptomatic but completely ambulatory Vitals:   02/02/21 1435  BP: 117/78  Pulse: 69  Temp: (!) 97 F (36.1 C)   Filed Weights   02/02/21 1435  Weight: 215 lb 8 oz (97.8 kg)    Physical Exam Constitutional:      General: He is not in acute distress.    Appearance: He is not diaphoretic.  HENT:     Head: Normocephalic and atraumatic.     Nose: Nose normal.     Mouth/Throat:     Pharynx: No oropharyngeal exudate.  Eyes:     General: No scleral icterus.     Pupils: Pupils are equal, round, and reactive to light.  Cardiovascular:     Rate and Rhythm: Normal rate and regular rhythm.     Heart sounds: No murmur heard. Pulmonary:     Effort: Pulmonary effort is normal. No respiratory distress.     Breath sounds: No rales.  Chest:     Chest wall: No tenderness.  Abdominal:     General: There is no distension.     Palpations: Abdomen is soft.     Tenderness: There is no abdominal tenderness.  Musculoskeletal:        General: Normal range of motion.     Cervical back: Normal range of motion and neck supple.  Skin:    General: Skin is warm and dry.     Findings: No erythema.  Neurological:     Mental Status: He is alert and oriented to person, place, and time.     Cranial Nerves: No cranial nerve deficit.     Motor: No abnormal muscle tone.     Coordination: Coordination normal.  Psychiatric:        Mood and Affect: Affect normal.      LABORATORY DATA:  I have reviewed the data as listed Lab Results  Component Value Date   WBC 4.9 02/02/2021   HGB 12.9 (L) 02/02/2021   HCT 39.2 02/02/2021   MCV 86.5 02/02/2021   PLT 200 02/02/2021   Recent Labs    01/19/21 1408 01/26/21 1345 02/02/21 1419  NA 135 139 137  K 3.5 3.7 3.5  CL 100 103 106  CO2 27 31 28   GLUCOSE 94 88 86  BUN 12 13 12   CREATININE 0.85 0.83 0.81  CALCIUM 8.6* 8.7* 8.6*  GFRNONAA >60 >60 >60  PROT 6.0* 6.2* 6.1*  ALBUMIN 3.3* 3.4* 3.4*  AST 17 11* 14*  ALT 22 19 29   ALKPHOS 48 51 50  BILITOT 0.7 1.4* 1.5*  BILIDIR  --  0.3*  --     Iron/TIBC/Ferritin/ %Sat No results found for: IRON, TIBC, FERRITIN, IRONPCTSAT    RADIOGRAPHIC STUDIES: I have personally reviewed the radiological images as listed and agreed with the findings in the report. No results found.     ASSESSMENT & PLAN:  1. Multiple myeloma not having achieved remission ([place])   2. Encounter for antineoplastic chemotherapy   3. Hypokalemia   4. Lytic bone lesions on xray   5. Lung  nodule  Cancer Staging Multiple myeloma not having achieved remission ([place]) Staging form: Plasma Cell Myeloma and Plasma Cell Disorders, AJCC 8th Edition - Clinical stage from 02/04/2020: Beta-2-microglobulin (mg/L): 2.1, Albumin (g/dL): 2.7, ISS: Stage II, High-risk cytogenetics: Absent, LDH: Unknown - Signed by Earlie Server, MD on 05/06/2020   #IgG multiple myeloma, stage II, del 1 p, high risk.  Declined bone marrow transplant evaluation. 1st treatment with RVD, BM biopsy after 8 cycles showed 1% plasma cells.  Labs are reviewed and discussed with patient. Proceed with cycle 11 Vrd.  Velcade and dexamethasone weekly. Start Revlimid 25 mg 2 weeks on 1 week off Continue aspirin 81 mg daily for DVT prophylaxis Continue acyclovir Recent multiple myeloma showed increase of M protein likely due to the treatment interruption.continue monitoring  #Bone lesion, Last dose zometa 08/07/2020.  Plan Zometa in Dec 2022 .  continue calcium 1200 mg daily.  # hypokalemia, potassium stable at 3.5.  continue potassium chloride 20 mEq daily. #Lung nodules,previously declined CT chest. We discussed again. He agrees.   We spent sufficient time to discuss many aspect of care, questions were answered to patient's satisfaction. All questions were answered. The patient knows to call the clinic with any problems questions or concerns.  cc Sofie Hartigan, MD   Return of visit:   Weekly Velcade and Dex x 2 Lab MD Velcade and Dex in 3 weeks.   Earlie Server, MD, PhD  02/02/2021

## 2021-02-02 NOTE — Progress Notes (Signed)
Nutrition Follow-up:  Patient with multiple myeloma.  Patient on chemotherapy.   Met with patient during infusion. Patient reports that appetite is a little bit better.  Continues drinking ensure plus usually in the morning. Has breakfast after shake.  Also eating lunch and dinner.    Denies nutrition impact symptoms    Medications: reviewed  Labs: reviewed  Anthropometrics:   Weight 215 lb 8 oz today  212 lb on 10/4 270 lb Nov 2021   NUTRITION DIAGNOSIS: Inadequate oral intake continues   INTERVENTION:  Continue 350 calorie shake for added nutrition Continue well balanced diet including good sources of protein Patient with question regarding foods containing K.  Discussed foods high in potassium    MONITORING, EVALUATION, GOAL: weight trends, intake   NEXT VISIT: as needed with treatments  Artemis Loyal B. Zenia Resides, [place], [place] Registered Dietitian [phone] (mobile)

## 2021-02-03 ENCOUNTER — Other Ambulatory Visit: Payer: Self-pay

## 2021-02-03 DIAGNOSIS — R911 Solitary pulmonary nodule: Secondary | ICD-10-CM

## 2021-02-03 LAB — KAPPA/LAMBDA LIGHT CHAINS
Kappa free light chain: 13.4 mg/L (ref 3.3–19.4)
Kappa, lambda light chain ratio: 1.7 — ABNORMAL HIGH (ref 0.26–1.65)
Lambda free light chains: 7.9 mg/L (ref 5.7–26.3)

## 2021-02-03 LAB — IGG: IgG (Immunoglobin G), Serum: 667 mg/dL (ref 603–1613)

## 2021-02-04 LAB — PROTEIN ELECTROPHORESIS, SERUM
A/G Ratio: 1.3 (ref 0.7–1.7)
Albumin ELP: 3 g/dL (ref 2.9–4.4)
Alpha-1-Globulin: 0.2 g/dL (ref 0.0–0.4)
Alpha-2-Globulin: 0.7 g/dL (ref 0.4–1.0)
Beta Globulin: 1.1 g/dL (ref 0.7–1.3)
Gamma Globulin: 0.3 g/dL — ABNORMAL LOW (ref 0.4–1.8)
Globulin, Total: 2.3 g/dL (ref 2.2–3.9)
M-Spike, %: 0.5 g/dL — ABNORMAL HIGH
Total Protein ELP: 5.3 g/dL — ABNORMAL LOW (ref 6.0–8.5)

## 2021-02-09 ENCOUNTER — Inpatient Hospital Stay: Payer: Medicare Other

## 2021-02-09 ENCOUNTER — Other Ambulatory Visit: Payer: Self-pay

## 2021-02-09 ENCOUNTER — Ambulatory Visit: Payer: Medicare Other

## 2021-02-09 ENCOUNTER — Other Ambulatory Visit: Payer: Medicare Other

## 2021-02-09 ENCOUNTER — Inpatient Hospital Stay: Payer: Medicare Other | Attending: Oncology

## 2021-02-09 VITALS — BP 106/68 | HR 82 | Temp 98.6°F | Wt 217.1 lb

## 2021-02-09 DIAGNOSIS — C9000 Multiple myeloma not having achieved remission: Secondary | ICD-10-CM | POA: Diagnosis not present

## 2021-02-09 DIAGNOSIS — E785 Hyperlipidemia, unspecified: Secondary | ICD-10-CM | POA: Diagnosis not present

## 2021-02-09 DIAGNOSIS — Z7982 Long term (current) use of aspirin: Secondary | ICD-10-CM | POA: Insufficient documentation

## 2021-02-09 DIAGNOSIS — I2510 Atherosclerotic heart disease of native coronary artery without angina pectoris: Secondary | ICD-10-CM | POA: Diagnosis not present

## 2021-02-09 DIAGNOSIS — R200 Anesthesia of skin: Secondary | ICD-10-CM | POA: Insufficient documentation

## 2021-02-09 DIAGNOSIS — R918 Other nonspecific abnormal finding of lung field: Secondary | ICD-10-CM | POA: Diagnosis not present

## 2021-02-09 DIAGNOSIS — M545 Low back pain, unspecified: Secondary | ICD-10-CM | POA: Insufficient documentation

## 2021-02-09 DIAGNOSIS — J984 Other disorders of lung: Secondary | ICD-10-CM | POA: Diagnosis not present

## 2021-02-09 DIAGNOSIS — R202 Paresthesia of skin: Secondary | ICD-10-CM | POA: Insufficient documentation

## 2021-02-09 DIAGNOSIS — F17210 Nicotine dependence, cigarettes, uncomplicated: Secondary | ICD-10-CM | POA: Diagnosis not present

## 2021-02-09 DIAGNOSIS — I10 Essential (primary) hypertension: Secondary | ICD-10-CM | POA: Insufficient documentation

## 2021-02-09 DIAGNOSIS — E876 Hypokalemia: Secondary | ICD-10-CM | POA: Insufficient documentation

## 2021-02-09 DIAGNOSIS — Z7961 Long term (current) use of immunomodulator: Secondary | ICD-10-CM | POA: Diagnosis not present

## 2021-02-09 DIAGNOSIS — Z5111 Encounter for antineoplastic chemotherapy: Secondary | ICD-10-CM | POA: Diagnosis not present

## 2021-02-09 DIAGNOSIS — I700 Atherosclerosis of aorta: Secondary | ICD-10-CM | POA: Diagnosis not present

## 2021-02-09 LAB — CBC WITH DIFFERENTIAL/PLATELET
Abs Immature Granulocytes: 0.04 10*3/uL (ref 0.00–0.07)
Basophils Absolute: 0 10*3/uL (ref 0.0–0.1)
Basophils Relative: 0 %
Eosinophils Absolute: 0.6 10*3/uL — ABNORMAL HIGH (ref 0.0–0.5)
Eosinophils Relative: 9 %
HCT: 39.2 % (ref 39.0–52.0)
Hemoglobin: 12.8 g/dL — ABNORMAL LOW (ref 13.0–17.0)
Immature Granulocytes: 1 %
Lymphocytes Relative: 18 %
Lymphs Abs: 1.2 10*3/uL (ref 0.7–4.0)
MCH: 28.3 pg (ref 26.0–34.0)
MCHC: 32.7 g/dL (ref 30.0–36.0)
MCV: 86.5 fL (ref 80.0–100.0)
Monocytes Absolute: 0.5 10*3/uL (ref 0.1–1.0)
Monocytes Relative: 8 %
Neutro Abs: 4.3 10*3/uL (ref 1.7–7.7)
Neutrophils Relative %: 64 %
Platelets: 132 10*3/uL — ABNORMAL LOW (ref 150–400)
RBC: 4.53 MIL/uL (ref 4.22–5.81)
RDW: 17.2 % — ABNORMAL HIGH (ref 11.5–15.5)
WBC: 6.6 10*3/uL (ref 4.0–10.5)
nRBC: 0 % (ref 0.0–0.2)

## 2021-02-09 LAB — COMPREHENSIVE METABOLIC PANEL
ALT: 30 U/L (ref 0–44)
AST: 14 U/L — ABNORMAL LOW (ref 15–41)
Albumin: 3.5 g/dL (ref 3.5–5.0)
Alkaline Phosphatase: 49 U/L (ref 38–126)
Anion gap: 9 (ref 5–15)
BUN: 14 mg/dL (ref 8–23)
CO2: 30 mmol/L (ref 22–32)
Calcium: 9.3 mg/dL (ref 8.9–10.3)
Chloride: 100 mmol/L (ref 98–111)
Creatinine, Ser: 0.85 mg/dL (ref 0.61–1.24)
GFR, Estimated: >60 mL/min (ref >60)
Glucose, Bld: 88 mg/dL (ref 70–99)
Potassium: 3.6 mmol/L (ref 3.5–5.1)
Sodium: 139 mmol/L (ref 135–145)
Total Bilirubin: 1.4 mg/dL — ABNORMAL HIGH (ref 0.3–1.2)
Total Protein: 6.2 g/dL — ABNORMAL LOW (ref 6.5–8.1)

## 2021-02-09 MED ORDER — BORTEZOMIB CHEMO SQ INJECTION 3.5 MG (2.5MG/ML)
1.3000 mg/m2 | Freq: Once | INTRAMUSCULAR | Status: AC
  Administered 2021-02-09: 3 mg via SUBCUTANEOUS
  Filled 2021-02-09: qty 1.2

## 2021-02-09 MED ORDER — DEXAMETHASONE 4 MG PO TABS
20.0000 mg | ORAL_TABLET | Freq: Once | ORAL | Status: AC
  Administered 2021-02-09: 20 mg via ORAL
  Filled 2021-02-09: qty 5

## 2021-02-09 MED ORDER — LENALIDOMIDE 25 MG PO CAPS
25.0000 mg | ORAL_CAPSULE | Freq: Every day | ORAL | 0 refills | Status: DC
Start: 2021-02-09 — End: 2021-03-03

## 2021-02-09 NOTE — Patient Instructions (Signed)
[facility] [place] [facility]  Discharge Instructions: Thank you for choosing [facility] to provide your oncology and hematology care.  If you have a lab appointment with the Cancer Center, please go directly to the Cancer Center and check in at the registration area.  Wear comfortable clothing and clothing appropriate for easy access to any Portacath or PICC line.   We strive to give you quality time with your provider. You may need to reschedule your appointment if you arrive late (15 or more minutes).  Arriving late affects you and other patients whose appointments are after yours.  Also, if you miss three or more appointments without notifying the office, you may be dismissed from the clinic at the provider's discretion.      For prescription refill requests, have your pharmacy contact our office and allow 72 hours for refills to be completed.    Today you received the following chemotherapy and/or immunotherapy agents Velcade      To help prevent nausea and vomiting after your treatment, we encourage you to take your nausea medication as directed.  BELOW ARE SYMPTOMS THAT SHOULD BE REPORTED IMMEDIATELY: *FEVER GREATER THAN 100.4 F (38 C) OR HIGHER *CHILLS OR SWEATING *NAUSEA AND VOMITING THAT IS NOT CONTROLLED WITH YOUR NAUSEA MEDICATION *UNUSUAL SHORTNESS OF BREATH *UNUSUAL BRUISING OR BLEEDING *URINARY PROBLEMS (pain or burning when urinating, or frequent urination) *BOWEL PROBLEMS (unusual diarrhea, constipation, pain near the anus) TENDERNESS IN MOUTH AND THROAT WITH OR WITHOUT PRESENCE OF ULCERS (sore throat, sores in mouth, or a toothache) UNUSUAL RASH, SWELLING OR PAIN  UNUSUAL VAGINAL DISCHARGE OR ITCHING   Items with * indicate a potential emergency and should be followed up as soon as possible or go to the Emergency Department if any problems should occur.  Please show the CHEMOTHERAPY ALERT CARD or IMMUNOTHERAPY ALERT CARD at check-in to  the Emergency Department and triage nurse.  Should you have questions after your visit or need to cancel or reschedule your appointment, please contact [facility] [place] [facility]  [phone] and follow the prompts.  Office hours are 8:00 a.m. to 4:30 p.m. Monday - Friday. Please note that voicemails left after 4:00 p.m. may not be returned until the following business day.  We are closed weekends and major holidays. You have access to a nurse at all times for urgent questions. Please call the main number to the clinic [phone] and follow the prompts.  For any non-urgent questions, you may also contact your provider using MyChart. We now offer e-Visits for anyone 18 and older to request care online for non-urgent symptoms. For details visit [email or web address].   Also download the MyChart app! Go to the app store, search "MyChart", open the app, select , and log in with your MyChart username and password.  Due to Covid, a mask is required upon entering the hospital/clinic. If you do not have a mask, one will be given to you upon arrival. For doctor visits, patients may have 1 support person aged 18 or older with them. For treatment visits, patients cannot have anyone with them due to current Covid guidelines and our immunocompromised population.  

## 2021-02-16 ENCOUNTER — Inpatient Hospital Stay: Payer: Medicare Other

## 2021-02-16 ENCOUNTER — Other Ambulatory Visit: Payer: Self-pay

## 2021-02-16 VITALS — BP 105/65 | HR 74 | Temp 99.3°F | Resp 18 | Wt 216.0 lb

## 2021-02-16 DIAGNOSIS — C9000 Multiple myeloma not having achieved remission: Secondary | ICD-10-CM | POA: Diagnosis not present

## 2021-02-16 LAB — COMPREHENSIVE METABOLIC PANEL
ALT: 19 U/L (ref 0–44)
AST: 13 U/L — ABNORMAL LOW (ref 15–41)
Albumin: 3.6 g/dL (ref 3.5–5.0)
Alkaline Phosphatase: 50 U/L (ref 38–126)
Anion gap: 7 (ref 5–15)
BUN: 17 mg/dL (ref 8–23)
CO2: 29 mmol/L (ref 22–32)
Calcium: 8.8 mg/dL — ABNORMAL LOW (ref 8.9–10.3)
Chloride: 99 mmol/L (ref 98–111)
Creatinine, Ser: 0.96 mg/dL (ref 0.61–1.24)
GFR, Estimated: >60 mL/min (ref >60)
Glucose, Bld: 78 mg/dL (ref 70–99)
Potassium: 3.9 mmol/L (ref 3.5–5.1)
Sodium: 135 mmol/L (ref 135–145)
Total Bilirubin: 2.1 mg/dL — ABNORMAL HIGH (ref 0.3–1.2)
Total Protein: 6.2 g/dL — ABNORMAL LOW (ref 6.5–8.1)

## 2021-02-16 LAB — CBC WITH DIFFERENTIAL/PLATELET
Abs Immature Granulocytes: 0.04 10*3/uL (ref 0.00–0.07)
Basophils Absolute: 0 10*3/uL (ref 0.0–0.1)
Basophils Relative: 0 %
Eosinophils Absolute: 0.5 10*3/uL (ref 0.0–0.5)
Eosinophils Relative: 7 %
HCT: 39.6 % (ref 39.0–52.0)
Hemoglobin: 13.1 g/dL (ref 13.0–17.0)
Immature Granulocytes: 1 %
Lymphocytes Relative: 17 %
Lymphs Abs: 1.3 10*3/uL (ref 0.7–4.0)
MCH: 28.9 pg (ref 26.0–34.0)
MCHC: 33.1 g/dL (ref 30.0–36.0)
MCV: 87.4 fL (ref 80.0–100.0)
Monocytes Absolute: 1.1 10*3/uL — ABNORMAL HIGH (ref 0.1–1.0)
Monocytes Relative: 15 %
Neutro Abs: 4.6 10*3/uL (ref 1.7–7.7)
Neutrophils Relative %: 60 %
Platelets: 144 10*3/uL — ABNORMAL LOW (ref 150–400)
RBC: 4.53 MIL/uL (ref 4.22–5.81)
RDW: 17.1 % — ABNORMAL HIGH (ref 11.5–15.5)
WBC: 7.4 10*3/uL (ref 4.0–10.5)
nRBC: 0 % (ref 0.0–0.2)

## 2021-02-16 MED ORDER — BORTEZOMIB CHEMO SQ INJECTION 3.5 MG (2.5MG/ML)
1.3000 mg/m2 | Freq: Once | INTRAMUSCULAR | Status: AC
  Administered 2021-02-16: 3 mg via SUBCUTANEOUS
  Filled 2021-02-16: qty 1.2

## 2021-02-16 MED ORDER — DEXAMETHASONE 4 MG PO TABS
20.0000 mg | ORAL_TABLET | Freq: Once | ORAL | Status: AC
  Administered 2021-02-16: 20 mg via ORAL
  Filled 2021-02-16: qty 5

## 2021-02-16 NOTE — Patient Instructions (Signed)
[place] [facility]  Discharge Instructions: Thank you for choosing [facility] to provide your oncology and hematology care.  If you have a lab appointment with the [facility], please go directly to the [facility] and check in at the registration area.  Wear comfortable clothing and clothing appropriate for easy access to any Portacath or PICC line.   We strive to give you quality time with your provider. You may need to reschedule your appointment if you arrive late (15 or more minutes).  Arriving late affects you and other patients whose appointments are after yours.  Also, if you miss three or more appointments without notifying the office, you may be dismissed from the clinic at the provider's discretion.      For prescription refill requests, have your pharmacy contact our office and allow 72 hours for refills to be completed.    Today you received the following chemotherapy and/or immunotherapy agents VELCADE      To help prevent nausea and vomiting after your treatment, we encourage you to take your nausea medication as directed.  BELOW ARE SYMPTOMS THAT SHOULD BE REPORTED IMMEDIATELY: *FEVER GREATER THAN 100.4 F (38 C) OR HIGHER *CHILLS OR SWEATING *NAUSEA AND VOMITING THAT IS NOT CONTROLLED WITH YOUR NAUSEA MEDICATION *UNUSUAL SHORTNESS OF BREATH *UNUSUAL BRUISING OR BLEEDING *URINARY PROBLEMS (pain or burning when urinating, or frequent urination) *BOWEL PROBLEMS (unusual diarrhea, constipation, pain near the anus) TENDERNESS IN MOUTH AND THROAT WITH OR WITHOUT PRESENCE OF ULCERS (sore throat, sores in mouth, or a toothache) UNUSUAL RASH, SWELLING OR PAIN  UNUSUAL VAGINAL DISCHARGE OR ITCHING   Items with * indicate a potential emergency and should be followed up as soon as possible or go to the Emergency Department if any problems should occur.  Please show the CHEMOTHERAPY ALERT CARD or IMMUNOTHERAPY ALERT CARD at check-in to  the Emergency Department and triage nurse.  Should you have questions after your visit or need to cancel or reschedule your appointment, please contact [facility] [place]  [phone] and follow the prompts.  Office hours are 8:00 a.m. to 4:30 p.m. Monday - Friday. Please note that voicemails left after 4:00 p.m. may not be returned until the following business day.  We are closed weekends and major holidays. You have access to a nurse at all times for urgent questions. Please call the main number to the clinic [phone] and follow the prompts.  For any non-urgent questions, you may also contact your provider using MyChart. We now offer e-Visits for anyone 67 and older to request care online for non-urgent symptoms. For details visit [email or web address].   Also download the MyChart app! Go to the app store, search "MyChart", open the app, select [place], and log in with your MyChart username and password.  Due to Covid, a mask is required upon entering the hospital/clinic. If you do not have a mask, one will be given to you upon arrival. For doctor visits, patients may have 1 support person aged 67 or older with them. For treatment visits, patients cannot have anyone with them due to current Covid guidelines and our immunocompromised population.   Bortezomib injection What is this medication? BORTEZOMIB (bor TEZ oh mib) targets proteins in cancer cells and stops the cancer cells from growing. It treats multiple myeloma and mantle cell lymphoma. This medicine may be used for other purposes; ask your health care provider or pharmacist if you have questions. COMMON BRAND NAME(S): Velcade What  should I tell my care team before I take this medication? They need to know if you have any of these conditions: dehydration diabetes (high blood sugar) heart disease liver disease tingling of the fingers or toes or other nerve disorder an unusual or allergic reaction  to bortezomib, mannitol, boron, other medicines, foods, dyes, or preservatives pregnant or trying to get pregnant breast-feeding How should I use this medication? This medicine is injected into a vein or under the skin. It is given by a health care provider in a hospital or clinic setting. Talk to your health care provider about the use of this medicine in children. Special care may be needed. Overdosage: If you think you have taken too much of this medicine contact a poison control center or emergency room at once. NOTE: This medicine is only for you. Do not share this medicine with others. What if I miss a dose? Keep appointments for follow-up doses. It is important not to miss your dose. Call your health care provider if you are unable to keep an appointment. What may interact with this medication? This medicine may interact with the following medications: ketoconazole rifampin This list may not describe all possible interactions. Give your health care provider a list of all the medicines, herbs, non-prescription drugs, or dietary supplements you use. Also tell them if you smoke, drink alcohol, or use illegal drugs. Some items may interact with your medicine. What should I watch for while using this medication? Your condition will be monitored carefully while you are receiving this medicine. You may need blood work done while you are taking this medicine. You may get drowsy or dizzy. Do not drive, use machinery, or do anything that needs mental alertness until you know how this medicine affects you. Do not stand up or sit up quickly, especially if you are an older patient. This reduces the risk of dizzy or fainting spells This medicine may increase your risk of getting an infection. Call your health care provider for advice if you get a fever, chills, sore throat, or other symptoms of a cold or flu. Do not treat yourself. Try to avoid being around people who are sick. Check with your health  care provider if you have severe diarrhea, nausea, and vomiting, or if you sweat a lot. The loss of too much body fluid may make it dangerous for you to take this medicine. Do not become pregnant while taking this medicine or for 7 months after stopping it. Women should inform their health care provider if they wish to become pregnant or think they might be pregnant. Men should not father a child while taking this medicine and for 4 months after stopping it. There is a potential for serious harm to an unborn child. Talk to your health care provider for more information. Do not breast-feed an infant while taking this medicine or for 2 months after stopping it. This medicine may make it more difficult to get pregnant or father a child. Talk to your health care provider if you are concerned about your fertility. What side effects may I notice from receiving this medication? Side effects that you should report to your doctor or health care professional as soon as possible: allergic reactions (skin rash; itching or hives; swelling of the face, lips, or tongue) bleeding (bloody or black, tarry stools; red or dark brown urine; spitting up blood or brown material that looks like coffee grounds; red spots on the skin; unusual bruising or bleeding from the eye,  gums, or nose) blurred vision or changes in vision confusion constipation headache heart failure (trouble breathing; fast, irregular heartbeat; sudden weight gain; swelling of the ankles, feet, hands) infection (fever, chills, cough, sore throat, pain or trouble passing urine) lack or loss of appetite liver injury (dark yellow or brown urine; general ill feeling or flu-like symptoms; loss of appetite, right upper belly pain; yellowing of the eyes or skin) low blood pressure (dizziness; feeling faint or lightheaded, falls; unusually weak or tired) muscle cramps pain, redness, or irritation at site where injected pain, tingling, numbness in the hands or  feet seizures trouble breathing unusual bruising or bleeding Side effects that usually do not require medical attention (report to your doctor or health care professional if they continue or are bothersome): diarrhea nausea stomach pain trouble sleeping vomiting This list may not describe all possible side effects. Call your doctor for medical advice about side effects. You may report side effects to FDA at 1-800-FDA-1088. Where should I keep my medication? This medicine is given in a hospital or clinic. It will not be stored at home. NOTE: This sheet is a summary. It may not cover all possible information. If you have questions about this medicine, talk to your doctor, pharmacist, or health care provider.  2022 Elsevier/Gold Standard (2020-03-19 00:00:00)  

## 2021-02-16 NOTE — Progress Notes (Signed)
Per shared secure chat with Pharmacy, MD and nurse- ok to proceed with bili 2.1

## 2021-02-17 ENCOUNTER — Ambulatory Visit
Admission: RE | Admit: 2021-02-17 | Discharge: 2021-02-17 | Disposition: A | Discharge status: Home / Self Care | Payer: Medicare Other | Source: Ambulatory Visit | Attending: Oncology | Admitting: Oncology

## 2021-02-17 DIAGNOSIS — R911 Solitary pulmonary nodule: Secondary | ICD-10-CM | POA: Diagnosis present

## 2021-02-23 ENCOUNTER — Inpatient Hospital Stay: Payer: Medicare Other

## 2021-02-23 ENCOUNTER — Other Ambulatory Visit: Payer: Self-pay

## 2021-02-23 ENCOUNTER — Inpatient Hospital Stay (HOSPITAL_BASED_OUTPATIENT_CLINIC_OR_DEPARTMENT_OTHER): Payer: Medicare Other | Admitting: Oncology

## 2021-02-23 ENCOUNTER — Encounter: Payer: Self-pay | Admitting: Oncology

## 2021-02-23 VITALS — BP 103/71 | HR 65 | Temp 98.2°F | Wt 221.0 lb

## 2021-02-23 DIAGNOSIS — Z5111 Encounter for antineoplastic chemotherapy: Secondary | ICD-10-CM

## 2021-02-23 DIAGNOSIS — C9000 Multiple myeloma not having achieved remission: Secondary | ICD-10-CM | POA: Diagnosis not present

## 2021-02-23 DIAGNOSIS — M899 Disorder of bone, unspecified: Secondary | ICD-10-CM

## 2021-02-23 LAB — CBC WITH DIFFERENTIAL/PLATELET
Abs Immature Granulocytes: 0.02 10*3/uL (ref 0.00–0.07)
Basophils Absolute: 0 10*3/uL (ref 0.0–0.1)
Basophils Relative: 0 %
Eosinophils Absolute: 0.2 10*3/uL (ref 0.0–0.5)
Eosinophils Relative: 3 %
HCT: 40 % (ref 39.0–52.0)
Hemoglobin: 12.9 g/dL — ABNORMAL LOW (ref 13.0–17.0)
Immature Granulocytes: 0 %
Lymphocytes Relative: 25 %
Lymphs Abs: 1.3 10*3/uL (ref 0.7–4.0)
MCH: 28.4 pg (ref 26.0–34.0)
MCHC: 32.3 g/dL (ref 30.0–36.0)
MCV: 87.9 fL (ref 80.0–100.0)
Monocytes Absolute: 0.9 10*3/uL (ref 0.1–1.0)
Monocytes Relative: 16 %
Neutro Abs: 2.8 10*3/uL (ref 1.7–7.7)
Neutrophils Relative %: 56 %
Platelets: 142 10*3/uL — ABNORMAL LOW (ref 150–400)
RBC: 4.55 MIL/uL (ref 4.22–5.81)
RDW: 17.2 % — ABNORMAL HIGH (ref 11.5–15.5)
WBC: 5.2 10*3/uL (ref 4.0–10.5)
nRBC: 0 % (ref 0.0–0.2)

## 2021-02-23 LAB — COMPREHENSIVE METABOLIC PANEL
ALT: 33 U/L (ref 0–44)
AST: 21 U/L (ref 15–41)
Albumin: 3.5 g/dL (ref 3.5–5.0)
Alkaline Phosphatase: 49 U/L (ref 38–126)
Anion gap: 6 (ref 5–15)
BUN: 17 mg/dL (ref 8–23)
CO2: 28 mmol/L (ref 22–32)
Calcium: 8.6 mg/dL — ABNORMAL LOW (ref 8.9–10.3)
Chloride: 104 mmol/L (ref 98–111)
Creatinine, Ser: 0.84 mg/dL (ref 0.61–1.24)
GFR, Estimated: >60 mL/min (ref >60)
Glucose, Bld: 63 mg/dL — ABNORMAL LOW (ref 70–99)
Potassium: 3.6 mmol/L (ref 3.5–5.1)
Sodium: 138 mmol/L (ref 135–145)
Total Bilirubin: 0.8 mg/dL (ref 0.3–1.2)
Total Protein: 6 g/dL — ABNORMAL LOW (ref 6.5–8.1)

## 2021-02-23 MED ORDER — DEXAMETHASONE 4 MG PO TABS
20.0000 mg | ORAL_TABLET | Freq: Once | ORAL | Status: AC
  Administered 2021-02-23: 20 mg via ORAL
  Filled 2021-02-23: qty 5

## 2021-02-23 MED ORDER — BORTEZOMIB CHEMO SQ INJECTION 3.5 MG (2.5MG/ML)
1.3000 mg/m2 | Freq: Once | INTRAMUSCULAR | Status: AC
  Administered 2021-02-23: 3 mg via SUBCUTANEOUS
  Filled 2021-02-23: qty 1.2

## 2021-02-23 NOTE — Progress Notes (Signed)
Hematology/Oncology follow up note [phone] [phone]   Patient Care Team: Sofie Hartigan, MD as PCP - General (Family Medicine) Noreene Filbert, MD as Radiation Oncologist (Radiation Oncology)  REFERRING PROVIDER: Sofie Hartigan, MD  CHIEF COMPLAINTS/REASON FOR VISIT:  Follow-up for multiple myeloma HISTORY OF PRESENTING ILLNESS:   Tommy Wirz. is a  65 y.o.  male with PMH listed below was seen in consultation at the request of  Feldpausch, Chrissie Noa, MD  for evaluation of abnormal serum protein electrophoresis.  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  Increase calcium level at 12.3, PTH was normal.  Creatinine 1, estimated GFR 91, CBC showed hemoglobin 12.4, Patient also recently had a CT chest abdomen pelvis with contrast for evaluation of right flank pain and right lower lobe nodule. 01/29/2020, CT showed multiple bilateral pulmonary nodules measuring up to 10 mm in the right middle lobe, nodules are indeterminate, infectious/inflammatory etiology versus metastatic disease. Multiple osseous lytic lesions, largest lesion involving the left pubic bone concerning for metastatic disease. Compression fractures at T9, age indeterminate. Appearance focal area of thickening at the gastroesophageal junction.  Further evaluation with upper GI study or direct visualization with endoscopy is recommended No bowel obstruction. Aortic atherosclerosis.  Patient reports NSAIDs as needed for lower back pain.  Mid lower back pain usually is worse when when he stands up from sitting position.  Patient works in the [employer].  Lives at home with wife.  Denies any unintentional weight loss, fever, chills, night sweating  # IgG multiple myeloma, stage II, del 1 p, high risk Baseline M protein 4.1, kappa light chain level 333.8 Beta-2 microglobulin 2.1, albumin 2.7. 02/12/2020, bone marrow biopsy showed hypercellular for age,  80% plasma cell which are complex restricted by light chain in situ heparinization.  Background hematopoiesis is present but reduced. Cytogenetics is normal, Myeloma FISH panel-deletion 1P,Standard risk. He is not interested in  # 02/27/2020 patient has been started on Revlimid 25 mg D1-14 #02/24/2020- 08/26/2020  Velcade weekly with dexamethasone 20 mg weekly  # 08/14/2020 M protein 0.5, close to 90% reduction from original M protein of 4.1, light chain ration 1.76 # 08/18/2020  Bone marrow results were reviewed.  1% plasma cell.  Normal cytogenetics.  Myeloma FISH negative for 1p del. #09/01/2020, patient agreed for second opinion at [facility] for evaluation and discussion of possible bone marrow transplant. He prefers to hold off maintenance treatment until his [place] appointment.   #Focal area of thickening at the GE junction. 10/23/2020 EGD is normal.   #Lung nodules, measures up to 10 mm on the right middle lobe.  Indeterminate.  Attention on follow-up.  Recommend repeat CT chest and he declined  11/03/2020, M protein 0.6, lambda free light chain 4.9, kappa light chain 14.8, kappa lambda light chain ratio 3.02. Discussed about proceed with 1 cycle of Revlimid 25 mg 2 weeks on 1 week off while waiting for [facility] second opinion.  He agrees with the plan.  12/09/2020 Evaluated by Dr.Gasparetto at [facility]  Recommend patient to resume VRD.  There was plan of bone marrow biopsy to be repeated at [facility].   INTERVAL HISTORY Tommy Georg. is a 65 y.o. male who has above history reviewed by me today presents for follow up visit for multiple myeloma He reports feeling well.  He has intermittent mild numbness and tingling.  Symptoms are stable.  No nausea vomiting diarrhea.  He tolerates treatments well.   Review of Systems  Constitutional:  Negative for appetite change, chills, fatigue, fever and unexpected weight change.  HENT:   Negative for hearing loss and voice change.   Eyes:  Negative for eye  problems and icterus.  Respiratory:  Negative for chest tightness, cough and shortness of breath.   Cardiovascular:  Negative for chest pain and leg swelling.  Gastrointestinal:  Negative for abdominal distention, abdominal pain and constipation.  Endocrine: Negative for hot flashes.  Genitourinary:  Negative for difficulty urinating, dysuria and frequency.   Musculoskeletal:  Negative for arthralgias, back pain and neck pain.  Skin:  Negative for itching and rash.  Neurological:  Positive for numbness. Negative for light-headedness.  Hematological:  Negative for adenopathy. Does not bruise/bleed easily.  Psychiatric/Behavioral:  Negative for confusion.     MEDICAL HISTORY:  Past Medical History:  Diagnosis Date   Cancer ([place])    Depression    Hypercalcemia 03/02/2020   Hyperlipemia    Hypertension    Hypogonadism in male    Multiple myeloma [facility])     SURGICAL HISTORY: Past Surgical History:  Procedure Laterality Date   BONE MARROW BIOPSY     COLONOSCOPY  07/14/2008   ESOPHAGOGASTRODUODENOSCOPY N/A 10/22/2020   Procedure: ESOPHAGOGASTRODUODENOSCOPY (EGD);  Surgeon: Jonathon Bellows, MD;  Location: [facility] ENDOSCOPY;  Service: Gastroenterology;  Laterality: N/A;    SOCIAL HISTORY: Social History   Socioeconomic History   Marital status: Married    Spouse name: Not on file   Number of children: Not on file   Years of education: Not on file   Highest education level: Not on file  Occupational History   Not on file  Tobacco Use   Smoking status: Every Day    Packs/day: 1.00    Years: 30.00    Pack years: 30.00    Types: Cigarettes   Smokeless tobacco: Never  Substance and Sexual Activity   Alcohol use: Yes    Comment: occcassional   Drug use: No   Sexual activity: Not on file  Other Topics Concern   Not on file  Social History Narrative   Not on file   Social Determinants of Health   Financial Resource Strain: Not on file  Food Insecurity: Not on file   Transportation Needs: Not on file  Physical Activity: Not on file  Stress: Not on file  Social Connections: Not on file  Intimate Partner Violence: Not on file    FAMILY HISTORY: Family History  Problem Relation Age of Onset   Skin cancer Mother    Prostate cancer Father     ALLERGIES:  has No Known Allergies.  MEDICATIONS:  Current Outpatient Medications  Medication Sig Dispense Refill   acyclovir (ZOVIRAX) 400 MG tablet TAKE 1 TABLET BY MOUTH TWICE A DAY 180 tablet 1   ASPIRIN 81 PO Take 81 mg by mouth daily.     atorvastatin (LIPITOR) 40 MG tablet Take 40 mg by mouth daily.     buPROPion (WELLBUTRIN XL) 300 MG 24 hr tablet Take 300 mg by mouth daily.     KLOR-CON M20 20 MEQ tablet TAKE 1 TABLET BY MOUTH TWICE A DAY 180 tablet 1   lenalidomide (REVLIMID) 25 MG capsule Take 1 capsule (25 mg total) by mouth daily. Take 14 days on, 7 days off, repeat every 21 days. 14 capsule 0   losartan-hydrochlorothiazide (HYZAAR) 100-12.5 MG tablet Take 1 tablet by mouth daily.     omeprazole (PRILOSEC) 20 MG capsule Take 1 capsule (20 mg total) by mouth daily. 30 capsule  1   polyethylene glycol (MIRALAX) 17 g packet Take 17 g by mouth daily. 14 each 0   Psyllium (METAMUCIL MULTIHEALTH FIBER PO) Take by mouth daily.     senna-docusate (SENNA S) 8.6-50 MG tablet Take 2 tablets by mouth daily. 60 tablet 3   amLODipine (NORVASC) 10 MG tablet Take by mouth daily.     No current facility-administered medications for this visit.     PHYSICAL EXAMINATION: ECOG PERFORMANCE STATUS: 1 - Symptomatic but completely ambulatory Vitals:   02/23/21 1408  BP: 103/71  Pulse: 65  Temp: 98.2 F (36.8 C)   Filed Weights   02/23/21 1408  Weight: 221 lb (100.2 kg)    Physical Exam Constitutional:      General: He is not in acute distress.    Appearance: He is not diaphoretic.  HENT:     Head: Normocephalic and atraumatic.     Nose: Nose normal.     Mouth/Throat:     Pharynx: No oropharyngeal  exudate.  Eyes:     General: No scleral icterus.    Pupils: Pupils are equal, round, and reactive to light.  Cardiovascular:     Rate and Rhythm: Normal rate and regular rhythm.     Heart sounds: No murmur heard. Pulmonary:     Effort: Pulmonary effort is normal. No respiratory distress.     Breath sounds: No rales.  Chest:     Chest wall: No tenderness.  Abdominal:     General: There is no distension.     Palpations: Abdomen is soft.     Tenderness: There is no abdominal tenderness.  Musculoskeletal:        General: Normal range of motion.     Cervical back: Normal range of motion and neck supple.  Skin:    General: Skin is warm and dry.     Findings: No erythema.  Neurological:     Mental Status: He is alert and oriented to person, place, and time.     Cranial Nerves: No cranial nerve deficit.     Motor: No abnormal muscle tone.     Coordination: Coordination normal.  Psychiatric:        Mood and Affect: Affect normal.      LABORATORY DATA:  I have reviewed the data as listed Lab Results  Component Value Date   WBC 5.2 02/23/2021   HGB 12.9 (L) 02/23/2021   HCT 40.0 02/23/2021   MCV 87.9 02/23/2021   PLT 142 (L) 02/23/2021   Recent Labs    01/26/21 1345 02/02/21 1419 02/09/21 1452 02/16/21 1451 02/23/21 1333  NA 139   < > 139 135 138  K 3.7   < > 3.6 3.9 3.6  CL 103   < > 100 99 104  CO2 31   < > 30 29 28   GLUCOSE 88   < > 88 78 63*  BUN 13   < > 14 17 17   CREATININE 0.83   < > 0.85 0.96 0.84  CALCIUM 8.7*   < > 9.3 8.8* 8.6*  GFRNONAA >60   < > >60 >60 >60  PROT 6.2*   < > 6.2* 6.2* 6.0*  ALBUMIN 3.4*   < > 3.5 3.6 3.5  AST 11*   < > 14* 13* 21  ALT 19   < > 30 19 33  ALKPHOS 51   < > 49 50 49  BILITOT 1.4*   < > 1.4* 2.1* 0.8  BILIDIR 0.3*  --   --   --   --    < > =  values in this interval not displayed.    Iron/TIBC/Ferritin/ %Sat No results found for: IRON, TIBC, FERRITIN, IRONPCTSAT    RADIOGRAPHIC STUDIES: I have personally reviewed  the radiological images as listed and agreed with the findings in the report. CT Chest Wo Contrast  Result Date: 02/18/2021 CLINICAL DATA:  Follow-up lung nodule, multiple myeloma EXAM: CT CHEST WITHOUT CONTRAST TECHNIQUE: Multidetector CT imaging of the chest was performed following the standard protocol without IV contrast. COMPARISON:  01/29/2020 FINDINGS: Cardiovascular: Scattered aortic atherosclerosis. Normal heart size. Scattered left and right coronary artery calcifications. No pericardial effusion. Mediastinum/Nodes: No enlarged mediastinal, hilar, or axillary lymph nodes. Thyroid gland, trachea, and esophagus demonstrate no significant findings. Lungs/Pleura: Mild, bandlike scarring of the bilateral lung bases. Multiple small pulmonary nodules are unchanged, largest again a 1.0 cm nodule of the lateral segment right middle lobe (series 2, image 29). Additional nodules include a 0.6 cm nodule of the peripheral right lower lobe (series 2, image 88). No new nodules. No pleural effusion or pneumothorax. Upper Abdomen: No acute abnormality. Musculoskeletal: Widespread, heterogeneously mottled, lytic appearance of the included osseous structures, with multiple new vertebral body wedging endplate deformities throughout the included lower cervical, thoracic, and upper lumbar spine, new wedge or endplate deformities involving the C7, T3, T7, T10 through T12, and L1 vertebral bodies. Interval worsening of a previously seen wedge deformity of T9. IMPRESSION: 1. Multiple small pulmonary nodules are unchanged, largest again a 1.0 cm nodule of the lateral segment right middle lobe. These are almost certainly incidental and benign sequelae of prior infection or inflammation, especially given that pulmonary involvement is very unusual in multiple myeloma. Continued attention on follow-up if indicated by oncologic imaging protocol. 2. Widespread, heterogeneously mottled, lytic appearance of the included osseous  structures, in keeping with multiple myeloma. 3. Multiple vertebral body wedge and endplate deformities throughout the included lower cervical, thoracic, and upper lumbar spine, new wedge or endplate deformities involving the C7, T3, T7, T10 through T12, and L1 vertebral bodies. Interval worsening of a previously seen wedge deformity of T9. 4. Coronary artery disease. Aortic Atherosclerosis (ICD10-I70.0). Electronically Signed   By: Delanna Ahmadi M.D.   On: 02/18/2021 10:24       ASSESSMENT & PLAN:  1. Multiple myeloma not having achieved remission ([place])   2. Encounter for antineoplastic chemotherapy   3. Lytic bone lesions on xray   Cancer Staging Multiple myeloma not having achieved remission ([place]) Staging form: Plasma Cell Myeloma and Plasma Cell Disorders, AJCC 8th Edition - Clinical stage from 02/04/2020: Beta-2-microglobulin (mg/L): 2.1, Albumin (g/dL): 2.7, ISS: Stage II, High-risk cytogenetics: Absent, LDH: Unknown - Signed by Earlie Server, MD on 05/06/2020   #IgG multiple myeloma, stage II, del 1 p, high risk.  Declined bone marrow transplant evaluation. 1st treatment with RVD, BM biopsy after 8 cycles showed 1% plasma cells.  Labs are reviewed and discussed with patient. Proceed with cycle 12 VRd.  Velcade and dexamethasone weekly. Advised patient to start Revlimid 25 mg 2 weeks on 1 week off. Continue aspirin 81 mg daily for DVT prophylaxis. Continue acyclovir for shingles prophylaxis Free light chain ratio 1.70.  Improving.  M spike has decreased to 0.5.  #Bone lesion, Last dose zometa 08/07/2020.  Plan Zometa in Dec 2022 .  continue calcium 1200 mg daily.  # hypokalemia, potassium stable at 3.5.  continue potassium chloride 20 mEq daily. #Lung nodules, Repeat CT was independently reviewed by me and discussed with patient. Multiple small lung nodules are unchanged.  Likely benign. Widely spread lytic appearance bone lesions.  Multiple vertebral body wedge and endplate  deformities throughout lower cervical, thoracic, upper lumbar spine, new wedge or endplate deformities involving C7, T3, T7, T10-T12, L1 vertebral bodies.  Interval worsening of the previously seen wedge deformity at T9.  Coronary artery disease. Recommend patient to avoid heavy lifting.   We spent sufficient time to discuss many aspect of care, questions were answered to patient's satisfaction. All questions were answered. The patient knows to call the clinic with any problems questions or concerns.  cc Sofie Hartigan, MD   Return of visit:   Weekly Velcade and Dex x 2 Lab MD Velcade and Dex in 3 weeks.   Earlie Server, MD, PhD  02/23/2021

## 2021-03-02 ENCOUNTER — Other Ambulatory Visit: Payer: Self-pay

## 2021-03-02 ENCOUNTER — Inpatient Hospital Stay: Payer: Medicare Other

## 2021-03-02 VITALS — BP 106/70 | HR 73 | Temp 96.0°F | Resp 18 | Wt 222.6 lb

## 2021-03-02 DIAGNOSIS — C9000 Multiple myeloma not having achieved remission: Secondary | ICD-10-CM

## 2021-03-02 LAB — CBC WITH DIFFERENTIAL/PLATELET
Abs Immature Granulocytes: 0.03 10*3/uL (ref 0.00–0.07)
Basophils Absolute: 0 10*3/uL (ref 0.0–0.1)
Basophils Relative: 0 %
Eosinophils Absolute: 0.4 10*3/uL (ref 0.0–0.5)
Eosinophils Relative: 8 %
HCT: 40.3 % (ref 39.0–52.0)
Hemoglobin: 13.2 g/dL (ref 13.0–17.0)
Immature Granulocytes: 1 %
Lymphocytes Relative: 22 %
Lymphs Abs: 1 10*3/uL (ref 0.7–4.0)
MCH: 28.6 pg (ref 26.0–34.0)
MCHC: 32.8 g/dL (ref 30.0–36.0)
MCV: 87.4 fL (ref 80.0–100.0)
Monocytes Absolute: 0.5 10*3/uL (ref 0.1–1.0)
Monocytes Relative: 10 %
Neutro Abs: 2.8 10*3/uL (ref 1.7–7.7)
Neutrophils Relative %: 59 %
Platelets: 143 10*3/uL — ABNORMAL LOW (ref 150–400)
RBC: 4.61 MIL/uL (ref 4.22–5.81)
RDW: 17.2 % — ABNORMAL HIGH (ref 11.5–15.5)
WBC: 4.7 10*3/uL (ref 4.0–10.5)
nRBC: 0 % (ref 0.0–0.2)

## 2021-03-02 LAB — COMPREHENSIVE METABOLIC PANEL
ALT: 29 U/L (ref 0–44)
AST: 18 U/L (ref 15–41)
Albumin: 3.6 g/dL (ref 3.5–5.0)
Alkaline Phosphatase: 44 U/L (ref 38–126)
Anion gap: 7 (ref 5–15)
BUN: 10 mg/dL (ref 8–23)
CO2: 31 mmol/L (ref 22–32)
Calcium: 8.8 mg/dL — ABNORMAL LOW (ref 8.9–10.3)
Chloride: 101 mmol/L (ref 98–111)
Creatinine, Ser: 1 mg/dL (ref 0.61–1.24)
GFR, Estimated: >60 mL/min (ref >60)
Glucose, Bld: 66 mg/dL — ABNORMAL LOW (ref 70–99)
Potassium: 3.3 mmol/L — ABNORMAL LOW (ref 3.5–5.1)
Sodium: 139 mmol/L (ref 135–145)
Total Bilirubin: 1.2 mg/dL (ref 0.3–1.2)
Total Protein: 6.2 g/dL — ABNORMAL LOW (ref 6.5–8.1)

## 2021-03-02 MED ORDER — BORTEZOMIB CHEMO SQ INJECTION 3.5 MG (2.5MG/ML)
1.3000 mg/m2 | Freq: Once | INTRAMUSCULAR | Status: AC
  Administered 2021-03-02: 3 mg via SUBCUTANEOUS
  Filled 2021-03-02: qty 1.2

## 2021-03-02 MED ORDER — DEXAMETHASONE 4 MG PO TABS
20.0000 mg | ORAL_TABLET | Freq: Once | ORAL | Status: AC
  Administered 2021-03-02: 20 mg via ORAL
  Filled 2021-03-02: qty 5

## 2021-03-02 NOTE — Progress Notes (Signed)
1342- Potassium: 3.3 today. Patient has a prescription for KLOR-CON 20 MEQ tablets at home and patient confirms with staff that he is taking 1 tablet by mouth twice a day. NP, Faythe Casa, notified and aware. Per NP: no new orders at this time; patient instructed to continue taking PO Potassium at home as prescribed; Potassium level will be monitored and checked again at next visit. Patient educated and verbalized understanding.

## 2021-03-02 NOTE — Patient Instructions (Signed)
[facility] ONCOLOGY   Discharge Instructions: Thank you for choosing [facility] to provide your oncology and hematology care.  If you have a lab appointment with the [facility], please go directly to the [facility] and check in at the registration area.  We strive to give you quality time with your provider. You may need to reschedule your appointment if you arrive late (15 or more minutes).  Arriving late affects you and other patients whose appointments are after yours.  Also, if you miss three or more appointments without notifying the office, you may be dismissed from the clinic at the provider's discretion.      For prescription refill requests, have your pharmacy contact our office and allow 72 hours for refills to be completed.    Today you received the following chemotherapy and/or immunotherapy agents: Velcade.      To help prevent nausea and vomiting after your treatment, we encourage you to take your nausea medication as directed.  BELOW ARE SYMPTOMS THAT SHOULD BE REPORTED IMMEDIATELY: *FEVER GREATER THAN 100.4 F (38 C) OR HIGHER *CHILLS OR SWEATING *NAUSEA AND VOMITING THAT IS NOT CONTROLLED WITH YOUR NAUSEA MEDICATION *UNUSUAL SHORTNESS OF BREATH *UNUSUAL BRUISING OR BLEEDING *URINARY PROBLEMS (pain or burning when urinating, or frequent urination) *BOWEL PROBLEMS (unusual diarrhea, constipation, pain near the anus) TENDERNESS IN MOUTH AND THROAT WITH OR WITHOUT PRESENCE OF ULCERS (sore throat, sores in mouth, or a toothache) UNUSUAL RASH, SWELLING OR PAIN  UNUSUAL VAGINAL DISCHARGE OR ITCHING   Items with * indicate a potential emergency and should be followed up as soon as possible or go to the Emergency Department if any problems should occur.  Please show the CHEMOTHERAPY ALERT CARD or IMMUNOTHERAPY ALERT CARD at check-in to the Emergency Department and triage nurse.  Should you have questions after your visit or need  to cancel or reschedule your appointment, please contact [facility]  [phone] and follow the prompts.  Office hours are 8:00 a.m. to 4:30 p.m. Monday - Friday. Please note that voicemails left after 4:00 p.m. may not be returned until the following business day.  We are closed weekends and major holidays. You have access to a nurse at all times for urgent questions. Please call the main number to the clinic [phone] and follow the prompts.  For any non-urgent questions, you may also contact your provider using MyChart. We now offer e-Visits for anyone 34 and older to request care online for non-urgent symptoms. For details visit [email or web address].   Also download the MyChart app! Go to the app store, search "MyChart", open the app, select [facility], and log in with your MyChart username and password.  Due to Covid, a mask is required upon entering the hospital/clinic. If you do not have a mask, one will be given to you upon arrival. For doctor visits, patients may have 1 support person aged 56 or older with them. For treatment visits, patients cannot have anyone with them due to current Covid guidelines and our immunocompromised population.

## 2021-03-03 ENCOUNTER — Other Ambulatory Visit: Payer: Self-pay

## 2021-03-03 DIAGNOSIS — C9000 Multiple myeloma not having achieved remission: Secondary | ICD-10-CM

## 2021-03-03 LAB — KAPPA/LAMBDA LIGHT CHAINS
Kappa free light chain: 13.9 mg/L (ref 3.3–19.4)
Kappa, lambda light chain ratio: 1.72 — ABNORMAL HIGH (ref 0.26–1.65)
Lambda free light chains: 8.1 mg/L (ref 5.7–26.3)

## 2021-03-03 MED ORDER — LENALIDOMIDE 25 MG PO CAPS: 25.0000 mg | ORAL_CAPSULE | Freq: Every day | ORAL | 0 refills | Status: DC

## 2021-03-08 LAB — MULTIPLE MYELOMA PANEL, SERUM
Albumin SerPl Elph-Mcnc: 3.2 g/dL (ref 2.9–4.4)
Albumin/Glob SerPl: 1.4 (ref 0.7–1.7)
Alpha 1: 0.2 g/dL (ref 0.0–0.4)
Alpha2 Glob SerPl Elph-Mcnc: 0.7 g/dL (ref 0.4–1.0)
B-Globulin SerPl Elph-Mcnc: 1 g/dL (ref 0.7–1.3)
Gamma Glob SerPl Elph-Mcnc: 0.3 g/dL — ABNORMAL LOW (ref 0.4–1.8)
Globulin, Total: 2.3 g/dL (ref 2.2–3.9)
IgA: 97 mg/dL (ref 61–437)
IgG (Immunoglobin G), Serum: 561 mg/dL — ABNORMAL LOW (ref 603–1613)
IgM (Immunoglobulin M), Srm: 20 mg/dL (ref 20–172)
M Protein SerPl Elph-Mcnc: 0.3 g/dL — ABNORMAL HIGH
Total Protein ELP: 5.5 g/dL — ABNORMAL LOW (ref 6.0–8.5)

## 2021-03-09 ENCOUNTER — Other Ambulatory Visit: Payer: Self-pay | Admitting: Oncology

## 2021-03-09 ENCOUNTER — Inpatient Hospital Stay: Payer: Medicare Other

## 2021-03-09 ENCOUNTER — Other Ambulatory Visit: Payer: Self-pay

## 2021-03-09 DIAGNOSIS — C9000 Multiple myeloma not having achieved remission: Secondary | ICD-10-CM | POA: Diagnosis not present

## 2021-03-09 LAB — CBC WITH DIFFERENTIAL/PLATELET
Abs Immature Granulocytes: 0.03 10*3/uL (ref 0.00–0.07)
Basophils Absolute: 0 10*3/uL (ref 0.0–0.1)
Basophils Relative: 0 %
Eosinophils Absolute: 0.5 10*3/uL (ref 0.0–0.5)
Eosinophils Relative: 8 %
HCT: 42.4 % (ref 39.0–52.0)
Hemoglobin: 13.9 g/dL (ref 13.0–17.0)
Immature Granulocytes: 1 %
Lymphocytes Relative: 17 %
Lymphs Abs: 1.1 10*3/uL (ref 0.7–4.0)
MCH: 28.5 pg (ref 26.0–34.0)
MCHC: 32.8 g/dL (ref 30.0–36.0)
MCV: 87.1 fL (ref 80.0–100.0)
Monocytes Absolute: 1.4 10*3/uL — ABNORMAL HIGH (ref 0.1–1.0)
Monocytes Relative: 21 %
Neutro Abs: 3.3 10*3/uL (ref 1.7–7.7)
Neutrophils Relative %: 53 %
Platelets: 151 10*3/uL (ref 150–400)
RBC: 4.87 MIL/uL (ref 4.22–5.81)
RDW: 16.7 % — ABNORMAL HIGH (ref 11.5–15.5)
WBC: 6.4 10*3/uL (ref 4.0–10.5)
nRBC: 0 % (ref 0.0–0.2)

## 2021-03-09 LAB — COMPREHENSIVE METABOLIC PANEL
ALT: 23 U/L (ref 0–44)
AST: 16 U/L (ref 15–41)
Albumin: 3.6 g/dL (ref 3.5–5.0)
Alkaline Phosphatase: 48 U/L (ref 38–126)
Anion gap: 10 (ref 5–15)
BUN: 18 mg/dL (ref 8–23)
CO2: 28 mmol/L (ref 22–32)
Calcium: 8.5 mg/dL — ABNORMAL LOW (ref 8.9–10.3)
Chloride: 99 mmol/L (ref 98–111)
Creatinine, Ser: 1.16 mg/dL (ref 0.61–1.24)
GFR, Estimated: >60 mL/min (ref >60)
Glucose, Bld: 80 mg/dL (ref 70–99)
Potassium: 3.4 mmol/L — ABNORMAL LOW (ref 3.5–5.1)
Sodium: 137 mmol/L (ref 135–145)
Total Bilirubin: 0.9 mg/dL (ref 0.3–1.2)
Total Protein: 6.5 g/dL (ref 6.5–8.1)

## 2021-03-09 NOTE — Progress Notes (Signed)
Patient with cough and congestion since the weekend. In bed all day Sunday with fever (unsure how high). Improving each day but still noticeably congested and reports cough, requests recommendation from Dr. Tasia Catchings for what he can safely take for symptoms with his chemo treatment. Per Dr. Tasia Catchings, [place] for DayQuil, Robitussin. No NSAIDs. Recommends COVID/flu testing. No Velcade/dex today. Return 12/6 or 12/7 for labs/MD/treatment. Patient advised to go to urgent care for eval and testing. Will send MyChart message with results.

## 2021-03-10 ENCOUNTER — Encounter: Payer: Self-pay | Admitting: Licensed Clinical Social Worker

## 2021-03-10 ENCOUNTER — Ambulatory Visit
Admission: EM | Admit: 2021-03-10 | Discharge: 2021-03-10 | Disposition: A | Discharge status: Home / Self Care | Payer: Medicare Other | Attending: Emergency Medicine | Admitting: Emergency Medicine

## 2021-03-10 DIAGNOSIS — U071 COVID-19: Secondary | ICD-10-CM | POA: Diagnosis not present

## 2021-03-10 DIAGNOSIS — F17210 Nicotine dependence, cigarettes, uncomplicated: Secondary | ICD-10-CM | POA: Insufficient documentation

## 2021-03-10 DIAGNOSIS — B349 Viral infection, unspecified: Secondary | ICD-10-CM

## 2021-03-10 DIAGNOSIS — R059 Cough, unspecified: Secondary | ICD-10-CM | POA: Diagnosis present

## 2021-03-10 LAB — RESP PANEL BY RT-PCR (FLU A&B, COVID) ARPGX2
Influenza A by PCR: NEGATIVE
Influenza B by PCR: NEGATIVE
SARS Coronavirus 2 by RT PCR: POSITIVE — AB

## 2021-03-10 LAB — KAPPA/LAMBDA LIGHT CHAINS
Kappa free light chain: 16.1 mg/L (ref 3.3–19.4)
Kappa, lambda light chain ratio: 1.68 — ABNORMAL HIGH (ref 0.26–1.65)
Lambda free light chains: 9.6 mg/L (ref 5.7–26.3)

## 2021-03-10 MED ORDER — MOLNUPIRAVIR EUA 200MG CAPSULE: 4.0000 | ORAL_CAPSULE | Freq: Two times a day (BID) | ORAL | 0 refills | Status: DC

## 2021-03-10 NOTE — ED Provider Notes (Signed)
[facility]    CSN: [id number] Arrival date & time: 03/10/21  3016      History   Chief Complaint Chief Complaint  Patient presents with  . Cough  . Nasal Congestion    HPI Tommy Smith. is a 65 y.o. male.   Patient presents with nasal congestion, rhinorrhea, sore throat sore throat, nonproductive cough and intermittent wheezing for 3 days.  Tolerating food and liquids. denies sick contacts. Has attempted use of dayquil, unsure of efficacy.  History of multiple myeloma, lung nodule, hypertension, hyperlipidemia, current smoker.   Past Medical History:  Diagnosis Date  . Cancer ([place])   . Depression   . Hypercalcemia 03/02/2020  . Hyperlipemia   . Hypertension   . Hypogonadism in male   . Multiple myeloma [facility])     Patient Active Problem List   Diagnosis Date Noted  . Lung nodule 05/06/2020  . Hypertension 04/29/2020  . Encounter for antineoplastic chemotherapy 03/25/2020  . Lung nodules 03/25/2020  . Hypokalemia 03/25/2020  . Normocytic anemia 03/18/2020  . Hypercalcemia 03/02/2020  . Multiple myeloma not having achieved remission ([place]) 02/21/2020  . Compression fracture of T9 vertebra ([place]) 02/21/2020  . Lytic bone lesions on xray 02/21/2020  . Neoplasm related pain 02/21/2020  . Goals of care, counseling/discussion 02/16/2020  . Current every day smoker 05/02/2019  . Snoring 02/02/2015  . Cough, persistent 03/07/2014  . Benign essential hypertension 12/01/2013  . Hyperlipidemia 12/01/2013  . Major depression in remission ([place]) 12/01/2013    Past Surgical History:  Procedure Laterality Date  . BONE MARROW BIOPSY    . COLONOSCOPY  07/14/2008  . ESOPHAGOGASTRODUODENOSCOPY N/A 10/22/2020   Procedure: ESOPHAGOGASTRODUODENOSCOPY (EGD);  Surgeon: Jonathon Bellows, MD;  Location: [facility] ENDOSCOPY;  Service: Gastroenterology;  Laterality: N/A;       Home Medications    Prior to Admission medications   Medication Sig Start Date End Date Taking?  Authorizing Provider  acyclovir (ZOVIRAX) 400 MG tablet TAKE 1 TABLET BY MOUTH TWICE A DAY 11/12/20  Yes Earlie Server, MD  ASPIRIN 81 PO Take 81 mg by mouth daily.   Yes [provider]  atorvastatin (LIPITOR) 40 MG tablet Take 40 mg by mouth daily.   Yes [provider]  buPROPion (WELLBUTRIN XL) 300 MG 24 hr tablet Take 300 mg by mouth daily. 11/11/20  Yes [provider]  KLOR-CON M20 20 MEQ tablet TAKE 1 TABLET BY MOUTH TWICE A DAY 10/28/20  Yes Earlie Server, MD  lenalidomide (REVLIMID) 25 MG capsule Take 1 capsule (25 mg total) by mouth daily. Take 14 days on, 7 days off, repeat every 21 days. 03/03/21  Yes Earlie Server, MD  losartan-hydrochlorothiazide (HYZAAR) 100-12.5 MG tablet Take 1 tablet by mouth daily. 01/22/20  Yes [provider]  omeprazole (PRILOSEC) 20 MG capsule Take 1 capsule (20 mg total) by mouth daily. 03/18/20  Yes Earlie Server, MD  polyethylene glycol (MIRALAX) 17 g packet Take 17 g by mouth daily. 03/02/20  Yes Earlie Server, MD  Psyllium (METAMUCIL MULTIHEALTH FIBER PO) Take by mouth daily.   Yes [provider]  senna-docusate (SENNA S) 8.6-50 MG tablet Take 2 tablets by mouth daily. 08/05/20  Yes Earlie Server, MD  amLODipine (NORVASC) 10 MG tablet Take by mouth daily. 05/31/19 01/12/21  [provider]    Family History Family History  Problem Relation Age of Onset  . Skin cancer Mother   . Prostate cancer Father     Social History Social History  Tobacco Use  . Smoking status: Every Day    Packs/day: 1.00    Years: 30.00    Pack years: 30.00    Types: Cigarettes  . Smokeless tobacco: Never  Substance Use Topics  . Alcohol use: Yes    Comment: occcassional  . Drug use: No     Allergies   Patient has no known allergies.   Review of Systems Review of Systems  Constitutional: Negative.   HENT:  Positive for congestion and rhinorrhea. Negative for dental problem, drooling, ear discharge, ear pain, facial swelling, hearing  loss, mouth sores, nosebleeds, postnasal drip, sinus pressure, sinus pain, sneezing, sore throat, tinnitus, trouble swallowing and voice change.   Respiratory:  Positive for cough and wheezing. Negative for apnea, choking, chest tightness, shortness of breath and stridor.   Cardiovascular: Negative.   Gastrointestinal: Negative.   Skin: Negative.   Neurological: Negative.     Physical Exam Triage Vital Signs ED Triage Vitals  Enc Vitals Group     BP 03/10/21 0924 97/61     Pulse Rate 03/10/21 0924 76     Resp 03/10/21 0924 16     Temp 03/10/21 0924 98.7 F (37.1 C)     Temp Source 03/10/21 0924 Oral     SpO2 03/10/21 0924 97 %     Weight 03/10/21 0923 221 lb (100.2 kg)     Height 03/10/21 0923 _0  (1.905 m)     Head Circumference --      Peak Flow --      Pain Score 03/10/21 0923 0     Pain Loc --      Pain Edu? --      Excl. in [place]? --    No data found.  Updated Vital Signs BP 97/61 (BP Location: Left Arm)   Pulse 76   Temp 98.7 F (37.1 C) (Oral)   Resp 16   Ht _1  (1.905 m)   Wt 221 lb (100.2 kg)   SpO2 97%   BMI 27.62 kg/m   Visual Acuity Right Eye Distance:   Left Eye Distance:   Bilateral Distance:    Right Eye Near:   Left Eye Near:    Bilateral Near:     Physical Exam Constitutional:      Appearance: Normal appearance. He is normal weight.  HENT:     Head: Normocephalic.     Right Ear: Hearing, ear canal and external ear normal. A middle ear effusion is present.     Left Ear: Hearing, ear canal and external ear normal. A middle ear effusion is present.     Nose: Congestion and rhinorrhea present.     Mouth/Throat:     Mouth: Mucous membranes are moist.     Pharynx: Posterior oropharyngeal erythema present.  Eyes:     Extraocular Movements: Extraocular movements intact.  Cardiovascular:     Rate and Rhythm: Normal rate and regular rhythm.     Pulses: Normal pulses.     Heart sounds: Normal heart sounds.  Pulmonary:     Effort: Pulmonary  effort is normal.     Breath sounds: Normal breath sounds.  Musculoskeletal:     Cervical back: Normal range of motion and neck supple.  Skin:    General: Skin is warm and dry.  Neurological:     Mental Status: He is alert and oriented to person, place, and time. Mental status is at baseline.  Psychiatric:        Mood and  Affect: Mood normal.        Behavior: Behavior normal.     UC Treatments / Results  Labs (all labs ordered are listed, but only abnormal results are displayed) Labs Reviewed - No data to display  EKG   Radiology No results found.  Procedures Procedures (including critical care time)  Medications Ordered in UC Medications - No data to display  Initial Impression / Assessment and Plan / UC Course  I have reviewed the triage vital signs and the nursing notes.  Pertinent labs & imaging results that were available during my care of the patient were reviewed by me and considered in my medical decision making (see chart for details).  Clinical Course as of 03/10/21 1031  Wed Mar 10, 2021  4383 Resp Panel by RT-PCR (Flu A&B, Covid) Nasopharyngeal Swab [AW]  1030 Attempted to notify x1, antiviral sent to pharmacy [AW]    Clinical Course User Index [AW] Hans Eden, NP    Viral illness  1.  COVID and flu test pending, discussed use of antivirals with patient, would like to move forward with use of if positive 2.  Over-the-counter medications for symptom management, per patient has to discuss medication with oncologist before use, will defer to oncology for medication management 3.  Urgent care follow-up as needed Final Clinical Impressions(s) / UC Diagnoses   Final diagnoses:  None   Discharge Instructions   None    ED Prescriptions   None    PDMP not reviewed this encounter.   Hans Eden, [place] 03/10/21 [phone]

## 2021-03-10 NOTE — Discharge Instructions (Addendum)
Your COVID and flu test is pending, if positive you will be notified and antiviral treatment will be sent in for either medication you will take it twice a day for the next 5 days, please remind the person who notifies you of your test results if positive that you qualify for antiviral medication  With viral illness symptoms will resolve on their own time, it can take up to 7 to 10 days before you truly begin to feel like yourself  Below are a list of over-the-counter medications that you may take to help assist with your symptoms, please use guidelines set by your oncologist before taking medicine    You can take Tylenol and/or Ibuprofen as needed for fever reduction and pain relief.   For cough: honey 1/2 to 1 teaspoon (you can dilute the honey in water or another fluid).  You can also use guaifenesin and dextromethorphan for cough. You can use a humidifier for chest congestion and cough.  If you don't have a humidifier, you can sit in the bathroom with the hot shower running.      For sore throat: try warm salt water gargles, cepacol lozenges, throat spray, warm tea or water with lemon/honey, popsicles or ice, or OTC cold relief medicine for throat discomfort.   For congestion: take a daily anti-histamine like Zyrtec, Claritin, and a oral decongestant, such as pseudoephedrine.  You can also use Flonase 1-2 sprays in each nostril daily.   It is important to stay hydrated: drink plenty of fluids (water, gatorade/powerade/pedialyte, juices, or teas) to keep your throat moisturized and help further relieve irritation/discomfort.

## 2021-03-10 NOTE — ED Triage Notes (Signed)
Pt c/o cough, nasal congestion x 3 days. Fever x 2 days ago.

## 2021-03-12 ENCOUNTER — Telehealth (HOSPITAL_COMMUNITY): Payer: Self-pay | Admitting: Emergency Medicine

## 2021-03-12 LAB — MULTIPLE MYELOMA PANEL, SERUM
Albumin SerPl Elph-Mcnc: 3.1 g/dL (ref 2.9–4.4)
Albumin/Glob SerPl: 1.2 (ref 0.7–1.7)
Alpha 1: 0.3 g/dL (ref 0.0–0.4)
Alpha2 Glob SerPl Elph-Mcnc: 0.9 g/dL (ref 0.4–1.0)
B-Globulin SerPl Elph-Mcnc: 1.1 g/dL (ref 0.7–1.3)
Gamma Glob SerPl Elph-Mcnc: 0.4 g/dL (ref 0.4–1.8)
Globulin, Total: 2.7 g/dL (ref 2.2–3.9)
IgA: 100 mg/dL (ref 61–437)
IgG (Immunoglobin G), Serum: 548 mg/dL — ABNORMAL LOW (ref 603–1613)
IgM (Immunoglobulin M), Srm: 27 mg/dL (ref 20–172)
M Protein SerPl Elph-Mcnc: 0.4 g/dL — ABNORMAL HIGH
Total Protein ELP: 5.8 g/dL — ABNORMAL LOW (ref 6.0–8.5)

## 2021-03-12 MED ORDER — MOLNUPIRAVIR EUA 200MG CAPSULE: 4.0000 | ORAL_CAPSULE | Freq: Two times a day (BID) | ORAL | 0 refills | Status: AC

## 2021-03-17 ENCOUNTER — Inpatient Hospital Stay: Payer: Medicare Other

## 2021-03-17 ENCOUNTER — Inpatient Hospital Stay: Payer: Medicare Other | Admitting: Oncology

## 2021-03-22 ENCOUNTER — Other Ambulatory Visit: Payer: Self-pay

## 2021-03-22 ENCOUNTER — Ambulatory Visit: Payer: Medicare Other | Admitting: Oncology

## 2021-03-22 ENCOUNTER — Other Ambulatory Visit: Payer: Medicare Other

## 2021-03-22 ENCOUNTER — Ambulatory Visit: Payer: Medicare Other

## 2021-03-22 DIAGNOSIS — C9000 Multiple myeloma not having achieved remission: Secondary | ICD-10-CM

## 2021-03-22 MED ORDER — LENALIDOMIDE 25 MG PO CAPS: 25.0000 mg | ORAL_CAPSULE | Freq: Every day | ORAL | 0 refills | Status: DC

## 2021-03-25 ENCOUNTER — Inpatient Hospital Stay (HOSPITAL_BASED_OUTPATIENT_CLINIC_OR_DEPARTMENT_OTHER): Payer: Medicare Other | Admitting: Oncology

## 2021-03-25 ENCOUNTER — Inpatient Hospital Stay: Payer: Medicare Other | Attending: Oncology

## 2021-03-25 ENCOUNTER — Inpatient Hospital Stay (HOSPITAL_BASED_OUTPATIENT_CLINIC_OR_DEPARTMENT_OTHER): Payer: Medicare Other

## 2021-03-25 ENCOUNTER — Encounter: Payer: Self-pay | Admitting: Oncology

## 2021-03-25 ENCOUNTER — Other Ambulatory Visit: Payer: Self-pay

## 2021-03-25 VITALS — BP 121/72 | HR 80 | Temp 97.8°F | Wt 222.2 lb

## 2021-03-25 DIAGNOSIS — E876 Hypokalemia: Secondary | ICD-10-CM

## 2021-03-25 DIAGNOSIS — M899 Disorder of bone, unspecified: Secondary | ICD-10-CM | POA: Diagnosis not present

## 2021-03-25 DIAGNOSIS — C9000 Multiple myeloma not having achieved remission: Secondary | ICD-10-CM | POA: Insufficient documentation

## 2021-03-25 DIAGNOSIS — Z5111 Encounter for antineoplastic chemotherapy: Secondary | ICD-10-CM

## 2021-03-25 DIAGNOSIS — R911 Solitary pulmonary nodule: Secondary | ICD-10-CM | POA: Diagnosis not present

## 2021-03-25 LAB — CBC WITH DIFFERENTIAL/PLATELET
Abs Immature Granulocytes: 0.01 10*3/uL (ref 0.00–0.07)
Basophils Absolute: 0 10*3/uL (ref 0.0–0.1)
Basophils Relative: 1 %
Eosinophils Absolute: 0.2 10*3/uL (ref 0.0–0.5)
Eosinophils Relative: 4 %
HCT: 41.2 % (ref 39.0–52.0)
Hemoglobin: 13.1 g/dL (ref 13.0–17.0)
Immature Granulocytes: 0 %
Lymphocytes Relative: 31 %
Lymphs Abs: 1.3 10*3/uL (ref 0.7–4.0)
MCH: 28.3 pg (ref 26.0–34.0)
MCHC: 31.8 g/dL (ref 30.0–36.0)
MCV: 89 fL (ref 80.0–100.0)
Monocytes Absolute: 0.5 10*3/uL (ref 0.1–1.0)
Monocytes Relative: 11 %
Neutro Abs: 2.2 10*3/uL (ref 1.7–7.7)
Neutrophils Relative %: 53 %
Platelets: 204 10*3/uL (ref 150–400)
RBC: 4.63 MIL/uL (ref 4.22–5.81)
RDW: 15.9 % — ABNORMAL HIGH (ref 11.5–15.5)
WBC: 4.2 10*3/uL (ref 4.0–10.5)
nRBC: 0 % (ref 0.0–0.2)

## 2021-03-25 LAB — COMPREHENSIVE METABOLIC PANEL
ALT: 29 U/L (ref 0–44)
AST: 20 U/L (ref 15–41)
Albumin: 3.7 g/dL (ref 3.5–5.0)
Alkaline Phosphatase: 50 U/L (ref 38–126)
Anion gap: 10 (ref 5–15)
BUN: 12 mg/dL (ref 8–23)
CO2: 26 mmol/L (ref 22–32)
Calcium: 8.9 mg/dL (ref 8.9–10.3)
Chloride: 102 mmol/L (ref 98–111)
Creatinine, Ser: 1.04 mg/dL (ref 0.61–1.24)
GFR, Estimated: >60 mL/min (ref >60)
Glucose, Bld: 103 mg/dL — ABNORMAL HIGH (ref 70–99)
Potassium: 3.5 mmol/L (ref 3.5–5.1)
Sodium: 138 mmol/L (ref 135–145)
Total Bilirubin: 1.3 mg/dL — ABNORMAL HIGH (ref 0.3–1.2)
Total Protein: 6.4 g/dL — ABNORMAL LOW (ref 6.5–8.1)

## 2021-03-25 MED ORDER — DEXAMETHASONE 4 MG PO TABS
20.0000 mg | ORAL_TABLET | Freq: Once | ORAL | Status: AC
  Administered 2021-03-25: 20 mg via ORAL
  Filled 2021-03-25: qty 5

## 2021-03-25 MED ORDER — BORTEZOMIB CHEMO SQ INJECTION 3.5 MG (2.5MG/ML)
1.3000 mg/m2 | Freq: Once | INTRAMUSCULAR | Status: AC
  Administered 2021-03-25: 3 mg via SUBCUTANEOUS
  Filled 2021-03-25: qty 1.2

## 2021-03-25 MED ORDER — ZOLEDRONIC ACID 4 MG/100ML IV SOLN
4.0000 mg | Freq: Once | INTRAVENOUS | Status: AC
  Administered 2021-03-25: 4 mg via INTRAVENOUS
  Filled 2021-03-25: qty 100

## 2021-03-25 MED ORDER — SODIUM CHLORIDE 0.9 % IV SOLN
Freq: Once | INTRAVENOUS | Status: AC
  Filled 2021-03-25: qty 250

## 2021-03-25 NOTE — Progress Notes (Signed)
Hematology/Oncology follow up note [phone] [phone]   Patient Care Team: Sofie Hartigan, MD as PCP - General (Family Medicine) Noreene Filbert, MD as Radiation Oncologist (Radiation Oncology)  REFERRING PROVIDER: Sofie Hartigan, MD  CHIEF COMPLAINTS/REASON FOR VISIT:  Follow-up for multiple myeloma HISTORY OF PRESENTING ILLNESS:   Tommy Smith. is a  65 y.o.  male with PMH listed below was seen in consultation at the request of  Feldpausch, Chrissie Noa, MD  for evaluation of abnormal serum protein electrophoresis.  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  Increase calcium level at 12.3, PTH was normal.  Creatinine 1, estimated GFR 91, CBC showed hemoglobin 12.4, Patient also recently had a CT chest abdomen pelvis with contrast for evaluation of right flank pain and right lower lobe nodule. 01/29/2020, CT showed multiple bilateral pulmonary nodules measuring up to 10 mm in the right middle lobe, nodules are indeterminate, infectious/inflammatory etiology versus metastatic disease. Multiple osseous lytic lesions, largest lesion involving the left pubic bone concerning for metastatic disease. Compression fractures at T9, age indeterminate. Appearance focal area of thickening at the gastroesophageal junction.  Further evaluation with upper GI study or direct visualization with endoscopy is recommended No bowel obstruction. Aortic atherosclerosis.  Patient reports NSAIDs as needed for lower back pain.  Mid lower back pain usually is worse when when he stands up from sitting position.  Patient works in the [employer].  Lives at home with wife.  Denies any unintentional weight loss, fever, chills, night sweating  # IgG multiple myeloma, stage II, del 1 p, high risk Baseline M protein 4.1, kappa light chain level 333.8 Beta-2 microglobulin 2.1, albumin 2.7. 02/12/2020, bone marrow biopsy showed hypercellular for age,  80% plasma cell which are complex restricted by light chain in situ heparinization.  Background hematopoiesis is present but reduced. Cytogenetics is normal, Myeloma FISH panel-deletion 1P,Standard risk. He is not interested in  # 02/27/2020 patient has been started on Revlimid 25 mg D1-14 #02/24/2020- 08/26/2020  Velcade weekly with dexamethasone 20 mg weekly  # 08/14/2020 M protein 0.5, close to 90% reduction from original M protein of 4.1, light chain ration 1.76 # 08/18/2020  Bone marrow results were reviewed.  1% plasma cell.  Normal cytogenetics.  Myeloma FISH negative for 1p del. #09/01/2020, patient agreed for second opinion at [facility] for evaluation and discussion of possible bone marrow transplant. He prefers to hold off maintenance treatment until his [place] appointment.   #Focal area of thickening at the GE junction. 10/23/2020 EGD is normal.   #Lung nodules, measures up to 10 mm on the right middle lobe.  Indeterminate.  Attention on follow-up.  Recommend repeat CT chest and he declined  11/03/2020, M protein 0.6, lambda free light chain 4.9, kappa light chain 14.8, kappa lambda light chain ratio 3.02. Discussed about proceed with 1 cycle of Revlimid 25 mg 2 weeks on 1 week off while waiting for [facility] second opinion.  He agrees with the plan.  12/09/2020 Evaluated by Dr.Gasparetto at [facility]  Recommend patient to resume VRD.  There was plan of bone marrow biopsy to be repeated at [facility].   INTERVAL HISTORY Tommy Smith. is a 65 y.o. male who has above history reviewed by me today presents for follow up visit for multiple myeloma He reports feeling well. COVID 19 + positive 2 weeks ago, seen at ER. Symptoms have all resolved.  Chronic intermittent mild numbness and tingling.    Review of Systems  Constitutional:  Negative for appetite change, chills, fatigue, fever and unexpected weight change.  HENT:   Negative for hearing loss and voice change.   Eyes:  Negative for eye problems and  icterus.  Respiratory:  Negative for chest tightness, cough and shortness of breath.   Cardiovascular:  Negative for chest pain and leg swelling.  Gastrointestinal:  Negative for abdominal distention, abdominal pain and constipation.  Endocrine: Negative for hot flashes.  Genitourinary:  Negative for difficulty urinating, dysuria and frequency.   Musculoskeletal:  Negative for arthralgias, back pain and neck pain.  Skin:  Negative for itching and rash.  Neurological:  Positive for numbness. Negative for light-headedness.  Hematological:  Negative for adenopathy. Does not bruise/bleed easily.  Psychiatric/Behavioral:  Negative for confusion.     MEDICAL HISTORY:  Past Medical History:  Diagnosis Date   Cancer ([place])    Depression    Hypercalcemia 03/02/2020   Hyperlipemia    Hypertension    Hypogonadism in male    Multiple myeloma [facility])     SURGICAL HISTORY: Past Surgical History:  Procedure Laterality Date   BONE MARROW BIOPSY     COLONOSCOPY  07/14/2008   ESOPHAGOGASTRODUODENOSCOPY N/A 10/22/2020   Procedure: ESOPHAGOGASTRODUODENOSCOPY (EGD);  Surgeon: Jonathon Bellows, MD;  Location: [facility] ENDOSCOPY;  Service: Gastroenterology;  Laterality: N/A;    SOCIAL HISTORY: Social History   Socioeconomic History   Marital status: Married    Spouse name: Not on file   Number of children: Not on file   Years of education: Not on file   Highest education level: Not on file  Occupational History   Not on file  Tobacco Use   Smoking status: Every Day    Packs/day: 1.00    Years: 30.00    Pack years: 30.00    Types: Cigarettes   Smokeless tobacco: Never  Substance and Sexual Activity   Alcohol use: Yes    Comment: occcassional   Drug use: No   Sexual activity: Not on file  Other Topics Concern   Not on file  Social History Narrative   Not on file   Social Determinants of Health   Financial Resource Strain: Not on file  Food Insecurity: Not on file  Transportation Needs:  Not on file  Physical Activity: Not on file  Stress: Not on file  Social Connections: Not on file  Intimate Partner Violence: Not on file    FAMILY HISTORY: Family History  Problem Relation Age of Onset   Skin cancer Mother    Prostate cancer Father     ALLERGIES:  has No Known Allergies.  MEDICATIONS:  Current Outpatient Medications  Medication Sig Dispense Refill   acyclovir (ZOVIRAX) 400 MG tablet TAKE 1 TABLET BY MOUTH TWICE A DAY 180 tablet 1   ASPIRIN 81 PO Take 81 mg by mouth daily.     atorvastatin (LIPITOR) 40 MG tablet Take 40 mg by mouth daily.     buPROPion (WELLBUTRIN XL) 300 MG 24 hr tablet Take 300 mg by mouth daily.     KLOR-CON M20 20 MEQ tablet TAKE 1 TABLET BY MOUTH TWICE A DAY 180 tablet 1   lenalidomide (REVLIMID) 25 MG capsule Take 1 capsule (25 mg total) by mouth daily. Take 14 days on, 7 days off, repeat every 21 days. 14 capsule 0   losartan-hydrochlorothiazide (HYZAAR) 100-12.5 MG tablet Take 1 tablet by mouth daily.     omeprazole (PRILOSEC) 20 MG capsule Take 1 capsule (20 mg total) by mouth daily. 30 capsule  1   polyethylene glycol (MIRALAX) 17 g packet Take 17 g by mouth daily. 14 each 0   Psyllium (METAMUCIL MULTIHEALTH FIBER PO) Take by mouth daily.     senna-docusate (SENNA S) 8.6-50 MG tablet Take 2 tablets by mouth daily. 60 tablet 3   amLODipine (NORVASC) 10 MG tablet Take by mouth daily.     No current facility-administered medications for this visit.     PHYSICAL EXAMINATION: ECOG PERFORMANCE STATUS: 1 - Symptomatic but completely ambulatory Vitals:   03/25/21 0842  BP: 121/72  Pulse: 80  Temp: 97.8 F (36.6 C)   Filed Weights   03/25/21 0842  Weight: 222 lb 3.2 oz (100.8 kg)    Physical Exam Constitutional:      General: He is not in acute distress.    Appearance: He is not diaphoretic.  HENT:     Head: Normocephalic and atraumatic.     Nose: Nose normal.     Mouth/Throat:     Pharynx: No oropharyngeal exudate.  Eyes:      General: No scleral icterus.    Pupils: Pupils are equal, round, and reactive to light.  Cardiovascular:     Rate and Rhythm: Normal rate and regular rhythm.     Heart sounds: No murmur heard. Pulmonary:     Effort: Pulmonary effort is normal. No respiratory distress.     Breath sounds: No rales.  Chest:     Chest wall: No tenderness.  Abdominal:     General: There is no distension.     Palpations: Abdomen is soft.     Tenderness: There is no abdominal tenderness.  Musculoskeletal:        General: Normal range of motion.     Cervical back: Normal range of motion and neck supple.  Skin:    General: Skin is warm and dry.     Findings: No erythema.  Neurological:     Mental Status: He is alert and oriented to person, place, and time.     Cranial Nerves: No cranial nerve deficit.     Motor: No abnormal muscle tone.     Coordination: Coordination normal.  Psychiatric:        Mood and Affect: Affect normal.      LABORATORY DATA:  I have reviewed the data as listed Lab Results  Component Value Date   WBC 4.2 03/25/2021   HGB 13.1 03/25/2021   HCT 41.2 03/25/2021   MCV 89.0 03/25/2021   PLT 204 03/25/2021   Recent Labs    01/26/21 1345 02/02/21 1419 03/02/21 1259 03/09/21 1500 03/25/21 0823  NA 139   < > 139 137 138  K 3.7   < > 3.3* 3.4* 3.5  CL 103   < > 101 99 102  CO2 31   < > 31 28 26   GLUCOSE 88   < > 66* 80 103*  BUN 13   < > 10 18 12   CREATININE 0.83   < > 1.00 1.16 1.04  CALCIUM 8.7*   < > 8.8* 8.5* 8.9  GFRNONAA >60   < > >60 >60 >60  PROT 6.2*   < > 6.2* 6.5 6.4*  ALBUMIN 3.4*   < > 3.6 3.6 3.7  AST 11*   < > 18 16 20   ALT 19   < > 29 23 29   ALKPHOS 51   < > 44 48 50  BILITOT 1.4*   < > 1.2 0.9 1.3*  BILIDIR 0.3*  --   --   --   --    < > =  values in this interval not displayed.    Iron/TIBC/Ferritin/ %Sat No results found for: IRON, TIBC, FERRITIN, IRONPCTSAT    RADIOGRAPHIC STUDIES: I have personally reviewed the radiological images as  listed and agreed with the findings in the report. CT Chest Wo Contrast  Result Date: 02/18/2021 CLINICAL DATA:  Follow-up lung nodule, multiple myeloma EXAM: CT CHEST WITHOUT CONTRAST TECHNIQUE: Multidetector CT imaging of the chest was performed following the standard protocol without IV contrast. COMPARISON:  01/29/2020 FINDINGS: Cardiovascular: Scattered aortic atherosclerosis. Normal heart size. Scattered left and right coronary artery calcifications. No pericardial effusion. Mediastinum/Nodes: No enlarged mediastinal, hilar, or axillary lymph nodes. Thyroid gland, trachea, and esophagus demonstrate no significant findings. Lungs/Pleura: Mild, bandlike scarring of the bilateral lung bases. Multiple small pulmonary nodules are unchanged, largest again a 1.0 cm nodule of the lateral segment right middle lobe (series 2, image 29). Additional nodules include a 0.6 cm nodule of the peripheral right lower lobe (series 2, image 88). No new nodules. No pleural effusion or pneumothorax. Upper Abdomen: No acute abnormality. Musculoskeletal: Widespread, heterogeneously mottled, lytic appearance of the included osseous structures, with multiple new vertebral body wedging endplate deformities throughout the included lower cervical, thoracic, and upper lumbar spine, new wedge or endplate deformities involving the C7, T3, T7, T10 through T12, and L1 vertebral bodies. Interval worsening of a previously seen wedge deformity of T9. IMPRESSION: 1. Multiple small pulmonary nodules are unchanged, largest again a 1.0 cm nodule of the lateral segment right middle lobe. These are almost certainly incidental and benign sequelae of prior infection or inflammation, especially given that pulmonary involvement is very unusual in multiple myeloma. Continued attention on follow-up if indicated by oncologic imaging protocol. 2. Widespread, heterogeneously mottled, lytic appearance of the included osseous structures, in keeping with  multiple myeloma. 3. Multiple vertebral body wedge and endplate deformities throughout the included lower cervical, thoracic, and upper lumbar spine, new wedge or endplate deformities involving the C7, T3, T7, T10 through T12, and L1 vertebral bodies. Interval worsening of a previously seen wedge deformity of T9. 4. Coronary artery disease. Aortic Atherosclerosis (ICD10-I70.0). Electronically Signed   By: Delanna Ahmadi M.D.   On: 02/18/2021 10:24       ASSESSMENT & PLAN:  1. Multiple myeloma not having achieved remission ([place])   2. Encounter for antineoplastic chemotherapy   3. Lytic bone lesions on xray   4. Lung nodule   5. Hypokalemia    Cancer Staging  Multiple myeloma not having achieved remission ([place]) Staging form: Plasma Cell Myeloma and Plasma Cell Disorders, AJCC 8th Edition - Clinical stage from 02/04/2020: Beta-2-microglobulin (mg/L): 2.1, Albumin (g/dL): 2.7, ISS: Stage II, High-risk cytogenetics: Absent, LDH: Unknown - Signed by Earlie Server, MD on 05/06/2020   #IgG multiple myeloma, stage II, del 1 p, high risk.  Declined bone marrow transplant evaluation. 1st treatment with RVD, BM biopsy after 8 cycles showed 1% plasma cells.  COVID 19 + positive 2 weeks ago, omit D15 dose of Velcade for cycle 12.  Labs are reviewed and discussed with patient. Proceed with cycle 13 VRd.  Velcade and dexamethasone weekly. Advised patient to start Revlimid 25 mg 2 weeks on 1 week off. Continue aspirin 81 mg daily for DVT prophylaxis. Continue acyclovir for shingles prophylaxis Free light chain ratio 1.68.  Improving.  M spike has decreased to 0.4 Plan to repeat bone marrow biopsy.  #Bone lesion,  Zom[place] 03/25/2021 . Continue calcium 1200 mg daily.  # hypokalemia, potassium stable at 3.5. continue potassium  chloride 20 mEq daily.  #Lung nodules, repeat CT showed no changes. Likely benign, monitor.    We spent sufficient time to discuss many aspect of care, questions were answered to  patient's satisfaction. All questions were answered. The patient knows to call the clinic with any problems questions or concerns.  cc Sofie Hartigan, MD   Return of visit:   Weekly Velcade and Dex x 2 Lab MD Velcade and Dex in 3 weeks.   Earlie Server, MD, PhD  03/25/2021

## 2021-03-25 NOTE — Patient Instructions (Signed)
[facility] CANCER CTR AT [place]  Discharge Instructions: Thank you for choosing [facility] to provide your oncology and hematology care.  If you have a lab appointment with the [facility], please go directly to the [facility] and check in at the registration area.  Wear comfortable clothing and clothing appropriate for easy access to any Portacath or PICC line.   We strive to give you quality time with your provider. You may need to reschedule your appointment if you arrive late (15 or more minutes).  Arriving late affects you and other patients whose appointments are after yours.  Also, if you miss three or more appointments without notifying the office, you may be dismissed from the clinic at the providers discretion.      For prescription refill requests, have your pharmacy contact our office and allow 72 hours for refills to be completed.    Today you received the following chemotherapy and/or immunotherapy agents Velcade      To help prevent nausea and vomiting after your treatment, we encourage you to take your nausea medication as directed.  BELOW ARE SYMPTOMS THAT SHOULD BE REPORTED IMMEDIATELY: *FEVER GREATER THAN 100.4 F (38 C) OR HIGHER *CHILLS OR SWEATING *NAUSEA AND VOMITING THAT IS NOT CONTROLLED WITH YOUR NAUSEA MEDICATION *UNUSUAL SHORTNESS OF BREATH *UNUSUAL BRUISING OR BLEEDING *URINARY PROBLEMS (pain or burning when urinating, or frequent urination) *BOWEL PROBLEMS (unusual diarrhea, constipation, pain near the anus) TENDERNESS IN MOUTH AND THROAT WITH OR WITHOUT PRESENCE OF ULCERS (sore throat, sores in mouth, or a toothache) UNUSUAL RASH, SWELLING OR PAIN  UNUSUAL VAGINAL DISCHARGE OR ITCHING   Items with * indicate a potential emergency and should be followed up as soon as possible or go to the Emergency Department if any problems should occur.  Please show the CHEMOTHERAPY ALERT CARD or IMMUNOTHERAPY ALERT CARD at check-in to the  Emergency Department and triage nurse.  Should you have questions after your visit or need to cancel or reschedule your appointment, please contact [facility] CANCER [place] AT [place]  [phone] and follow the prompts.  Office hours are 8:00 a.m. to 4:30 p.m. Monday - Friday. Please note that voicemails left after 4:00 p.m. may not be returned until the following business day.  We are closed weekends and major holidays. You have access to a nurse at all times for urgent questions. Please call the main number to the clinic [phone] and follow the prompts.  For any non-urgent questions, you may also contact your provider using MyChart. We now offer e-Visits for anyone 59 and older to request care online for non-urgent symptoms. For details visit [email or web address].   Also download the MyChart app! Go to the app store, search "MyChart", open the app, select [facility], and log in with your MyChart username and password.  Due to Covid, a mask is required upon entering the hospital/clinic. If you do not have a mask, one will be given to you upon arrival. For doctor visits, patients may have 1 support person aged 72 or older with them. For treatment visits, patients cannot have anyone with them due to current Covid guidelines and our immunocompromised population.     Zoledronic Acid Injection (Hypercalcemia, Oncology) What is this medication? ZOLEDRONIC ACID (ZOE le dron ik AS id) slows calcium loss from bones. It high calcium levels in the blood from some kinds of cancer. It may be used in other people at risk for bone loss. This medicine may be used for other  purposes; ask your health care provider or pharmacist if you have questions. COMMON BRAND NAME(S): Zometa What should I tell my care team before I take this medication? They need to know if you have any of these conditions: cancer dehydration dental disease kidney disease liver disease low levels of calcium in the  blood lung or breathing disease (asthma) receiving steroids like dexamethasone or prednisone an unusual or allergic reaction to zoledronic acid, other medicines, foods, dyes, or preservatives pregnant or trying to get pregnant breast-feeding How should I use this medication? This drug is injected into a vein. It is given by a health care provider in a hospital or clinic setting. Talk to your health care provider about the use of this drug in children. Special care may be needed. Overdosage: If you think you have taken too much of this medicine contact a poison control center or emergency room at once. NOTE: This medicine is only for you. Do not share this medicine with others. What if I miss a dose? Keep appointments for follow-up doses. It is important not to miss your dose. Call your health care provider if you are unable to keep an appointment. What may interact with this medication? certain antibiotics given by injection NSAIDs, medicines for pain and inflammation, like ibuprofen or naproxen some diuretics like bumetanide, furosemide teriparatide thalidomide This list may not describe all possible interactions. Give your health care provider a list of all the medicines, herbs, non-prescription drugs, or dietary supplements you use. Also tell them if you smoke, drink alcohol, or use illegal drugs. Some items may interact with your medicine. What should I watch for while using this medication? Visit your health care provider for regular checks on your progress. It may be some time before you see the benefit from this drug. Some people who take this drug have severe bone, joint, or muscle pain. This drug may also increase your risk for jaw problems or a broken thigh bone. Tell your health care provider right away if you have severe pain in your jaw, bones, joints, or muscles. Tell you health care provider if you have any pain that does not go away or that gets worse. Tell your dentist and  dental surgeon that you are taking this drug. You should not have major dental surgery while on this drug. See your dentist to have a dental exam and fix any dental problems before starting this drug. Take good care of your teeth while on this drug. Make sure you see your dentist for regular follow-up appointments. You should make sure you get enough calcium and vitamin D while you are taking this drug. Discuss the foods you eat and the vitamins you take with your health care provider. Check with your health care provider if you have severe diarrhea, nausea, and vomiting, or if you sweat a lot. The loss of too much body fluid may make it dangerous for you to take this drug. You may need blood work done while you are taking this drug. Do not become pregnant while taking this drug. Women should inform their health care provider if they wish to become pregnant or think they might be pregnant. There is potential for serious harm to an unborn child. Talk to your health care provider for more information. What side effects may I notice from receiving this medication? Side effects that you should report to your doctor or health care provider as soon as possible: allergic reactions (skin rash, itching or hives; swelling of the face,  lips, or tongue) bone pain infection (fever, chills, cough, sore throat, pain or trouble passing urine) jaw pain, especially after dental work joint pain kidney injury (trouble passing urine or change in the amount of urine) low blood pressure (dizziness; feeling faint or lightheaded, falls; unusually weak or tired) low calcium levels (fast heartbeat; muscle cramps or pain; pain, tingling, or numbness in the hands or feet; seizures) low magnesium levels (fast, irregular heartbeat; muscle cramp or pain; muscle weakness; tremors; seizures) low red blood cell counts (trouble breathing; feeling faint; lightheaded, falls; unusually weak or tired) muscle pain redness, blistering,  peeling, or loosening of the skin, including inside the mouth severe diarrhea swelling of the ankles, feet, hands trouble breathing Side effects that usually do not require medical attention (report to your doctor or health care provider if they continue or are bothersome): anxious constipation coughing depressed mood eye irritation, itching, or pain fever general ill feeling or flu-like symptoms nausea pain, redness, or irritation at site where injected trouble sleeping This list may not describe all possible side effects. Call your doctor for medical advice about side effects. You may report side effects to FDA at 1-800-FDA-1088. Where should I keep my medication? This drug is given in a hospital or clinic. It will not be stored at home. NOTE: This sheet is a summary. It may not cover all possible information. If you have questions about this medicine, talk to your doctor, pharmacist, or health care provider.  2022 Elsevier/Gold Standard (2020-12-15 00:00:00)

## 2021-03-26 LAB — KAPPA/LAMBDA LIGHT CHAINS
Kappa free light chain: 17.6 mg/L (ref 3.3–19.4)
Kappa, lambda light chain ratio: 1.53 (ref 0.26–1.65)
Lambda free light chains: 11.5 mg/L (ref 5.7–26.3)

## 2021-03-30 LAB — MULTIPLE MYELOMA PANEL, SERUM
Albumin SerPl Elph-Mcnc: 3.4 g/dL (ref 2.9–4.4)
Albumin/Glob SerPl: 1.4 (ref 0.7–1.7)
Alpha 1: 0.2 g/dL (ref 0.0–0.4)
Alpha2 Glob SerPl Elph-Mcnc: 0.8 g/dL (ref 0.4–1.0)
B-Globulin SerPl Elph-Mcnc: 1.1 g/dL (ref 0.7–1.3)
Gamma Glob SerPl Elph-Mcnc: 0.4 g/dL (ref 0.4–1.8)
Globulin, Total: 2.5 g/dL (ref 2.2–3.9)
IgA: 175 mg/dL (ref 61–437)
IgG (Immunoglobin G), Serum: 655 mg/dL (ref 603–1613)
IgM (Immunoglobulin M), Srm: 20 mg/dL (ref 20–172)
M Protein SerPl Elph-Mcnc: 0.3 g/dL — ABNORMAL HIGH
Total Protein ELP: 5.9 g/dL — ABNORMAL LOW (ref 6.0–8.5)

## 2021-04-01 ENCOUNTER — Other Ambulatory Visit: Payer: Self-pay | Admitting: *Deleted

## 2021-04-01 ENCOUNTER — Inpatient Hospital Stay: Payer: Medicare Other

## 2021-04-01 ENCOUNTER — Other Ambulatory Visit: Payer: Self-pay

## 2021-04-01 VITALS — BP 113/80 | HR 73 | Temp 96.1°F | Wt 219.0 lb

## 2021-04-01 DIAGNOSIS — C9000 Multiple myeloma not having achieved remission: Secondary | ICD-10-CM

## 2021-04-01 DIAGNOSIS — Z5111 Encounter for antineoplastic chemotherapy: Secondary | ICD-10-CM | POA: Diagnosis not present

## 2021-04-01 LAB — COMPREHENSIVE METABOLIC PANEL
ALT: 16 U/L (ref 0–44)
AST: 14 U/L — ABNORMAL LOW (ref 15–41)
Albumin: 3.7 g/dL (ref 3.5–5.0)
Alkaline Phosphatase: 52 U/L (ref 38–126)
Anion gap: 7 (ref 5–15)
BUN: 11 mg/dL (ref 8–23)
CO2: 28 mmol/L (ref 22–32)
Calcium: 9.2 mg/dL (ref 8.9–10.3)
Chloride: 101 mmol/L (ref 98–111)
Creatinine, Ser: 0.94 mg/dL (ref 0.61–1.24)
GFR, Estimated: >60 mL/min (ref >60)
Glucose, Bld: 88 mg/dL (ref 70–99)
Potassium: 3.3 mmol/L — ABNORMAL LOW (ref 3.5–5.1)
Sodium: 136 mmol/L (ref 135–145)
Total Bilirubin: 0.8 mg/dL (ref 0.3–1.2)
Total Protein: 6.4 g/dL — ABNORMAL LOW (ref 6.5–8.1)

## 2021-04-01 LAB — CBC WITH DIFFERENTIAL/PLATELET
Abs Immature Granulocytes: 0.03 10*3/uL (ref 0.00–0.07)
Basophils Absolute: 0 10*3/uL (ref 0.0–0.1)
Basophils Relative: 0 %
Eosinophils Absolute: 0.9 10*3/uL — ABNORMAL HIGH (ref 0.0–0.5)
Eosinophils Relative: 16 %
HCT: 41.7 % (ref 39.0–52.0)
Hemoglobin: 13.3 g/dL (ref 13.0–17.0)
Immature Granulocytes: 1 %
Lymphocytes Relative: 19 %
Lymphs Abs: 1.1 10*3/uL (ref 0.7–4.0)
MCH: 28.3 pg (ref 26.0–34.0)
MCHC: 31.9 g/dL (ref 30.0–36.0)
MCV: 88.7 fL (ref 80.0–100.0)
Monocytes Absolute: 0.6 10*3/uL (ref 0.1–1.0)
Monocytes Relative: 10 %
Neutro Abs: 3.3 10*3/uL (ref 1.7–7.7)
Neutrophils Relative %: 54 %
Platelets: 187 10*3/uL (ref 150–400)
RBC: 4.7 MIL/uL (ref 4.22–5.81)
RDW: 15.8 % — ABNORMAL HIGH (ref 11.5–15.5)
WBC: 5.9 10*3/uL (ref 4.0–10.5)
nRBC: 0 % (ref 0.0–0.2)

## 2021-04-01 MED ORDER — DEXAMETHASONE 4 MG PO TABS
20.0000 mg | ORAL_TABLET | Freq: Once | ORAL | Status: AC
  Administered 2021-04-01: 09:00:00 20 mg via ORAL
  Filled 2021-04-01: qty 5

## 2021-04-01 MED ORDER — BORTEZOMIB CHEMO SQ INJECTION 3.5 MG (2.5MG/ML)
1.3000 mg/m2 | Freq: Once | INTRAMUSCULAR | Status: AC
  Administered 2021-04-01: 09:00:00 3 mg via SUBCUTANEOUS
  Filled 2021-04-01: qty 1.2

## 2021-04-01 NOTE — Progress Notes (Signed)
Patient tolerated Velcade injection well today, no concerns voiced. Patient discharged, stable.

## 2021-04-01 NOTE — Progress Notes (Signed)
Patient here for Velcade,  K 3.3 MD notified, advised to continue current regimen K at home twice daily. Patient agrees and verbalizes understanding.

## 2021-04-01 NOTE — Patient Instructions (Signed)
[facility] CANCER CTR AT [place]  Discharge Instructions: Thank you for choosing [facility] to provide your oncology and hematology care.  If you have a lab appointment with the [facility], please go directly to the [facility] and check in at the registration area.  Wear comfortable clothing and clothing appropriate for easy access to any Portacath or PICC line.   We strive to give you quality time with your provider. You may need to reschedule your appointment if you arrive late (15 or more minutes).  Arriving late affects you and other patients whose appointments are after yours.  Also, if you miss three or more appointments without notifying the office, you may be dismissed from the clinic at the providers discretion.      For prescription refill requests, have your pharmacy contact our office and allow 72 hours for refills to be completed.    Today you received the following chemotherapy and/or immunotherapy agents:Velcade   To help prevent nausea and vomiting after your treatment, we encourage you to take your nausea medication as directed.  BELOW ARE SYMPTOMS THAT SHOULD BE REPORTED IMMEDIATELY: *FEVER GREATER THAN 100.4 F (38 C) OR HIGHER *CHILLS OR SWEATING *NAUSEA AND VOMITING THAT IS NOT CONTROLLED WITH YOUR NAUSEA MEDICATION *UNUSUAL SHORTNESS OF BREATH *UNUSUAL BRUISING OR BLEEDING *URINARY PROBLEMS (pain or burning when urinating, or frequent urination) *BOWEL PROBLEMS (unusual diarrhea, constipation, pain near the anus) TENDERNESS IN MOUTH AND THROAT WITH OR WITHOUT PRESENCE OF ULCERS (sore throat, sores in mouth, or a toothache) UNUSUAL RASH, SWELLING OR PAIN  UNUSUAL VAGINAL DISCHARGE OR ITCHING   Items with * indicate a potential emergency and should be followed up as soon as possible or go to the Emergency Department if any problems should occur.  Please show the CHEMOTHERAPY ALERT CARD or IMMUNOTHERAPY ALERT CARD at check-in to the  Emergency Department and triage nurse.  Should you have questions after your visit or need to cancel or reschedule your appointment, please contact [facility] CANCER [place] AT [place]  [phone] and follow the prompts.  Office hours are 8:00 a.m. to 4:30 p.m. Monday - Friday. Please note that voicemails left after 4:00 p.m. may not be returned until the following business day.  We are closed weekends and major holidays. You have access to a nurse at all times for urgent questions. Please call the main number to the clinic [phone] and follow the prompts.  For any non-urgent questions, you may also contact your provider using MyChart. We now offer e-Visits for anyone 25 and older to request care online for non-urgent symptoms. For details visit [email or web address].   Also download the MyChart app! Go to the app store, search "MyChart", open the app, select [facility], and log in with your MyChart username and password.  Potassium Content of Foods Potassium is a mineral found in many foods and drinks. It affects how the heart works, and helps keep fluids and minerals balanced in the body. The amount of potassium you need each day depends on your age and any medical conditions you may have. Talk to your health care provider or dietitian about how much potassium you need. The following lists of foods provide the general serving size for foods and the approximate amount of potassium in each serving, listed in milligrams (mg). Actual values may vary depending on the product and how it is processed.  High in potassium The following foods and beverages have 200 mg or more of potassium per serving: Apricots (  raw) - 2 have 200 mg of potassium. Apricots (dry) - 5 have 200 mg of potassium. Artichoke - 1 medium has 345 mg of potassium. Avocado -  fruit has 245 mg of potassium. Banana - 1 medium fruit has 425 mg of potassium. [place] or baked beans (canned) -  cup has 280 mg of  potassium. White beans (canned) -  cup has 595 mg potassium. Beef roast - 3 oz has 320 mg of potassium. Ground beef - 3 oz has 270 mg of potassium. Beets (raw or cooked) -  cup has 260 mg of potassium. Bran muffin - 2 oz has 300 mg of potassium. Broccoli (cooked) -  cup has 230 mg of potassium. Brussels sprouts -  cup has 250 mg of potassium. Cantaloupe -  cup has 215 mg of potassium. Cereal, 100% bran -  cup has 200-400 mg of potassium. Cheeseburger -1 single fast food burger has 225-400 mg of potassium. Chicken - 3 oz has 220 mg of potassium. Clams (canned) - 3 oz has 535 mg of potassium. Crab - 3 oz has 225 mg of potassium. Dates - 5 have 270 mg of potassium. Dried beans and peas -  cup has 300-475 mg of potassium. Figs (dried) - 2 have 260 mg of potassium. Fish (halibut, tuna, cod, snapper) - 3 oz has 480 mg of potassium. Fish (salmon, haddock, swordfish, perch) - 3 oz has 300 mg of potassium. Fish (tuna, canned) - 3 oz has 200 mg of potassium. [place] fries (fast food) - 3 oz has 470 mg of potassium. Granola with fruit and nuts -  cup has 200 mg of potassium. Grapefruit juice -  cup has 200 mg of potassium. Honeydew melon -  cup has 200 mg of potassium. Kale (raw) - 1 cup has 300 mg of potassium. Kiwi - 1 medium fruit has 240 mg of potassium. Kohlrabi, rutabaga, parsnips -  cup has 280 mg of potassium. Lentils -  cup has 365 mg of potassium. Mango - 1 each has 325 mg of potassium. Milk (nonfat, low-fat, whole, buttermilk) - 1 cup has 350-380 mg of potassium. Milk (chocolate) - 1 cup has 420 mg of potassium Molasses - 1 Tbsp has 295 mg of potassium. Mushrooms -  cup has 280 mg of potassium. Nectarine - 1 each has 275 mg of potassium. Nuts (almonds, peanuts, hazelnuts, [place], cashew, mixed) - 1 oz has 200 mg of potassium. Nuts (pistachios) - 1 oz has 295 mg of potassium. Orange - 1 fruit has 240 mg of potassium. Orange juice -  cup has 235 mg of  potassium. Papaya -  medium fruit has 390 mg of potassium. Peanut butter (chunky) - 2 Tbsp has 240 mg of potassium. Peanut butter (smooth) - 2 Tbsp has 210 mg of potassium. Pear - 1 medium (200 mg) of potassium. Pomegranate - 1 whole fruit has 400 mg of potassium. Pomegranate juice -  cup has 215 mg of potassium. Pork - 3 oz has 350 mg of potassium. Potato chips (salted) - 1 oz has 465 mg of potassium. Potato (baked with skin) - 1 medium has 925 mg of potassium. Potato (boiled) -  cup has 255 mg of potassium. Potato (Mashed) -  cup has 330 mg of potassium. Prune juice -  cup has 370 mg of potassium. Prunes - 5 have 305 mg of potassium. Pudding (chocolate) -  cup has 230 mg of potassium. Pumpkin (canned) -  cup has 250 mg of potassium. Raisins (seedless) -  cup  has 270 mg of potassium. Seeds (sunflower or pumpkin) - 1 oz has 240 mg of potassium. Soy milk - 1 cup has 300 mg of potassium. Spinach (cooked) - 1/2 cup has 420 mg of potassium. Spinach (canned) -  cup has 370 mg of potassium. Sweet potato (baked with skin) - 1 medium has 450 mg of potassium. Swiss chard -  cup has 480 mg of potassium. Tomato or vegetable juice -  cup has 275 mg of potassium. Tomato (sauce or puree) -  cup has 400-550 mg of potassium. Tomato (raw) - 1 medium has 290 mg of potassium. Tomato (canned) -  cup has 200-300 mg of potassium. [place] - 3 oz has 250 mg of potassium. Wheat germ - 1 oz has 250 mg of potassium. Winter squash -  cup has 250 mg of potassium. Yogurt (plain or fruited) - 6 oz has 260-435 mg of potassium. Zucchini -  cup has 220 mg of potassium. Summary Potassium is a mineral found in many foods and drinks. It affects how the heart works, and helps keep fluids and minerals balanced in the body. The amount of potassium you need each day depends on your age and any existing medical conditions you may have. Your health care provider or dietitian may recommend an amount of potassium  that you should have each day. This information is not intended to replace advice given to you by your health care provider. Make sure you discuss any questions you have with your health care provider. Document Revised: 03/10/2017 Document Reviewed: 06/22/2016 Elsevier Patient Education  [place].

## 2021-04-02 LAB — KAPPA/LAMBDA LIGHT CHAINS
Kappa free light chain: 16.5 mg/L (ref 3.3–19.4)
Kappa, lambda light chain ratio: 1.59 (ref 0.26–1.65)
Lambda free light chains: 10.4 mg/L (ref 5.7–26.3)

## 2021-04-08 ENCOUNTER — Inpatient Hospital Stay: Payer: Medicare Other

## 2021-04-08 ENCOUNTER — Other Ambulatory Visit: Payer: Self-pay

## 2021-04-08 VITALS — BP 107/74 | HR 67 | Temp 96.3°F | Wt 223.1 lb

## 2021-04-08 DIAGNOSIS — C9000 Multiple myeloma not having achieved remission: Secondary | ICD-10-CM

## 2021-04-08 DIAGNOSIS — Z5111 Encounter for antineoplastic chemotherapy: Secondary | ICD-10-CM | POA: Diagnosis not present

## 2021-04-08 LAB — CBC WITH DIFFERENTIAL/PLATELET
Abs Immature Granulocytes: 0.03 10*3/uL (ref 0.00–0.07)
Basophils Absolute: 0 10*3/uL (ref 0.0–0.1)
Basophils Relative: 0 %
Eosinophils Absolute: 0.7 10*3/uL — ABNORMAL HIGH (ref 0.0–0.5)
Eosinophils Relative: 12 %
HCT: 41.3 % (ref 39.0–52.0)
Hemoglobin: 13.1 g/dL (ref 13.0–17.0)
Immature Granulocytes: 1 %
Lymphocytes Relative: 19 %
Lymphs Abs: 1.1 10*3/uL (ref 0.7–4.0)
MCH: 28.2 pg (ref 26.0–34.0)
MCHC: 31.7 g/dL (ref 30.0–36.0)
MCV: 88.8 fL (ref 80.0–100.0)
Monocytes Absolute: 1.1 10*3/uL — ABNORMAL HIGH (ref 0.1–1.0)
Monocytes Relative: 19 %
Neutro Abs: 3 10*3/uL (ref 1.7–7.7)
Neutrophils Relative %: 49 %
Platelets: 154 10*3/uL (ref 150–400)
RBC: 4.65 MIL/uL (ref 4.22–5.81)
RDW: 15.8 % — ABNORMAL HIGH (ref 11.5–15.5)
WBC: 6 10*3/uL (ref 4.0–10.5)
nRBC: 0 % (ref 0.0–0.2)

## 2021-04-08 LAB — MULTIPLE MYELOMA PANEL, SERUM
Albumin SerPl Elph-Mcnc: 3.5 g/dL (ref 2.9–4.4)
Albumin/Glob SerPl: 1.5 (ref 0.7–1.7)
Alpha 1: 0.2 g/dL (ref 0.0–0.4)
Alpha2 Glob SerPl Elph-Mcnc: 0.8 g/dL (ref 0.4–1.0)
B-Globulin SerPl Elph-Mcnc: 1.1 g/dL (ref 0.7–1.3)
Gamma Glob SerPl Elph-Mcnc: 0.4 g/dL (ref 0.4–1.8)
Globulin, Total: 2.4 g/dL (ref 2.2–3.9)
IgA: 159 mg/dL (ref 61–437)
IgG (Immunoglobin G), Serum: 630 mg/dL (ref 603–1613)
IgM (Immunoglobulin M), Srm: 20 mg/dL (ref 20–172)
M Protein SerPl Elph-Mcnc: 0.4 g/dL — ABNORMAL HIGH
Total Protein ELP: 5.9 g/dL — ABNORMAL LOW (ref 6.0–8.5)

## 2021-04-08 LAB — COMPREHENSIVE METABOLIC PANEL
ALT: 24 U/L (ref 0–44)
AST: 17 U/L (ref 15–41)
Albumin: 3.5 g/dL (ref 3.5–5.0)
Alkaline Phosphatase: 45 U/L (ref 38–126)
Anion gap: 6 (ref 5–15)
BUN: 13 mg/dL (ref 8–23)
CO2: 29 mmol/L (ref 22–32)
Calcium: 8.4 mg/dL — ABNORMAL LOW (ref 8.9–10.3)
Chloride: 102 mmol/L (ref 98–111)
Creatinine, Ser: 0.79 mg/dL (ref 0.61–1.24)
GFR, Estimated: >60 mL/min (ref >60)
Glucose, Bld: 93 mg/dL (ref 70–99)
Potassium: 3.2 mmol/L — ABNORMAL LOW (ref 3.5–5.1)
Sodium: 137 mmol/L (ref 135–145)
Total Bilirubin: 0.7 mg/dL (ref 0.3–1.2)
Total Protein: 6.2 g/dL — ABNORMAL LOW (ref 6.5–8.1)

## 2021-04-08 MED ORDER — DEXAMETHASONE 4 MG PO TABS
20.0000 mg | ORAL_TABLET | Freq: Once | ORAL | Status: AC
  Administered 2021-04-08: 09:00:00 20 mg via ORAL
  Filled 2021-04-08: qty 5

## 2021-04-08 MED ORDER — SODIUM CHLORIDE 0.9 % IV SOLN
INTRAVENOUS | Status: DC
  Filled 2021-04-08: qty 250

## 2021-04-08 MED ORDER — BORTEZOMIB CHEMO SQ INJECTION 3.5 MG (2.5MG/ML)
1.3000 mg/m2 | Freq: Once | INTRAMUSCULAR | Status: AC
  Administered 2021-04-08: 11:00:00 3 mg via SUBCUTANEOUS
  Filled 2021-04-08: qty 1.2

## 2021-04-08 MED ORDER — POTASSIUM CHLORIDE 10 MEQ/100ML IV SOLN
10.0000 meq | INTRAVENOUS | Status: AC
  Administered 2021-04-08: 09:00:00 10 meq via INTRAVENOUS
  Filled 2021-04-08: qty 100

## 2021-04-08 NOTE — Progress Notes (Signed)
K+ 3.2 . Pt states is taking oral k+ bid.  Per MD - give IV K 10 meq IV over 1 hr today. Advise pt to not re-start Revlimid until she she sees pt 1/5 per MD . Pt aware and verbalize understanding.

## 2021-04-15 ENCOUNTER — Inpatient Hospital Stay: Payer: Medicare Other | Attending: Oncology

## 2021-04-15 ENCOUNTER — Inpatient Hospital Stay: Payer: Medicare Other | Admitting: Oncology

## 2021-04-15 ENCOUNTER — Inpatient Hospital Stay: Payer: Medicare Other

## 2021-04-15 ENCOUNTER — Other Ambulatory Visit: Payer: Self-pay

## 2021-04-15 ENCOUNTER — Encounter: Payer: Self-pay | Admitting: Oncology

## 2021-04-15 VITALS — BP 124/82 | HR 62 | Temp 98.2°F | Wt 228.0 lb

## 2021-04-15 DIAGNOSIS — C9000 Multiple myeloma not having achieved remission: Secondary | ICD-10-CM

## 2021-04-15 DIAGNOSIS — Z79899 Other long term (current) drug therapy: Secondary | ICD-10-CM | POA: Insufficient documentation

## 2021-04-15 DIAGNOSIS — I2510 Atherosclerotic heart disease of native coronary artery without angina pectoris: Secondary | ICD-10-CM | POA: Insufficient documentation

## 2021-04-15 DIAGNOSIS — F17210 Nicotine dependence, cigarettes, uncomplicated: Secondary | ICD-10-CM | POA: Diagnosis not present

## 2021-04-15 DIAGNOSIS — I700 Atherosclerosis of aorta: Secondary | ICD-10-CM | POA: Insufficient documentation

## 2021-04-15 DIAGNOSIS — G629 Polyneuropathy, unspecified: Secondary | ICD-10-CM | POA: Insufficient documentation

## 2021-04-15 DIAGNOSIS — Z5111 Encounter for antineoplastic chemotherapy: Secondary | ICD-10-CM | POA: Diagnosis not present

## 2021-04-15 DIAGNOSIS — M899 Disorder of bone, unspecified: Secondary | ICD-10-CM | POA: Diagnosis not present

## 2021-04-15 DIAGNOSIS — E876 Hypokalemia: Secondary | ICD-10-CM | POA: Diagnosis not present

## 2021-04-15 DIAGNOSIS — I10 Essential (primary) hypertension: Secondary | ICD-10-CM | POA: Insufficient documentation

## 2021-04-15 DIAGNOSIS — R911 Solitary pulmonary nodule: Secondary | ICD-10-CM | POA: Diagnosis not present

## 2021-04-15 DIAGNOSIS — E785 Hyperlipidemia, unspecified: Secondary | ICD-10-CM | POA: Diagnosis not present

## 2021-04-15 LAB — CBC WITH DIFFERENTIAL/PLATELET
Abs Immature Granulocytes: 0.01 10*3/uL (ref 0.00–0.07)
Basophils Absolute: 0 10*3/uL (ref 0.0–0.1)
Basophils Relative: 0 %
Eosinophils Absolute: 0.2 10*3/uL (ref 0.0–0.5)
Eosinophils Relative: 3 %
HCT: 40.4 % (ref 39.0–52.0)
Hemoglobin: 13.2 g/dL (ref 13.0–17.0)
Immature Granulocytes: 0 %
Lymphocytes Relative: 19 %
Lymphs Abs: 0.9 10*3/uL (ref 0.7–4.0)
MCH: 28.9 pg (ref 26.0–34.0)
MCHC: 32.7 g/dL (ref 30.0–36.0)
MCV: 88.4 fL (ref 80.0–100.0)
Monocytes Absolute: 0.8 10*3/uL (ref 0.1–1.0)
Monocytes Relative: 17 %
Neutro Abs: 2.8 10*3/uL (ref 1.7–7.7)
Neutrophils Relative %: 61 %
Platelets: 127 10*3/uL — ABNORMAL LOW (ref 150–400)
RBC: 4.57 MIL/uL (ref 4.22–5.81)
RDW: 16.2 % — ABNORMAL HIGH (ref 11.5–15.5)
WBC: 4.6 10*3/uL (ref 4.0–10.5)
nRBC: 0 % (ref 0.0–0.2)

## 2021-04-15 LAB — COMPREHENSIVE METABOLIC PANEL
ALT: 36 U/L (ref 0–44)
AST: 21 U/L (ref 15–41)
Albumin: 3.3 g/dL — ABNORMAL LOW (ref 3.5–5.0)
Alkaline Phosphatase: 42 U/L (ref 38–126)
Anion gap: 4 — ABNORMAL LOW (ref 5–15)
BUN: 13 mg/dL (ref 8–23)
CO2: 27 mmol/L (ref 22–32)
Calcium: 8.6 mg/dL — ABNORMAL LOW (ref 8.9–10.3)
Chloride: 106 mmol/L (ref 98–111)
Creatinine, Ser: 0.86 mg/dL (ref 0.61–1.24)
GFR, Estimated: >60 mL/min (ref >60)
Glucose, Bld: 84 mg/dL (ref 70–99)
Potassium: 3.4 mmol/L — ABNORMAL LOW (ref 3.5–5.1)
Sodium: 137 mmol/L (ref 135–145)
Total Bilirubin: 1.3 mg/dL — ABNORMAL HIGH (ref 0.3–1.2)
Total Protein: 5.9 g/dL — ABNORMAL LOW (ref 6.5–8.1)

## 2021-04-15 MED ORDER — DEXAMETHASONE 4 MG PO TABS
20.0000 mg | ORAL_TABLET | Freq: Once | ORAL | Status: AC
  Administered 2021-04-15: 20 mg via ORAL
  Filled 2021-04-15: qty 5

## 2021-04-15 MED ORDER — BORTEZOMIB CHEMO SQ INJECTION 3.5 MG (2.5MG/ML)
1.3000 mg/m2 | Freq: Once | INTRAMUSCULAR | Status: AC
  Administered 2021-04-15: 3 mg via SUBCUTANEOUS
  Filled 2021-04-15: qty 1.2

## 2021-04-15 NOTE — Patient Instructions (Signed)
[facility] AT [place]  Discharge Instructions: Thank you for choosing [facility] to provide your oncology and hematology care.  If you have a lab appointment with the [facility], please go directly to the [facility] and check in at the registration area.  Wear comfortable clothing and clothing appropriate for easy access to any Portacath or PICC line.   We strive to give you quality time with your provider. You may need to reschedule your appointment if you arrive late (15 or more minutes).  Arriving late affects you and other patients whose appointments are after yours.  Also, if you miss three or more appointments without notifying the office, you may be dismissed from the clinic at the providers discretion.      For prescription refill requests, have your pharmacy contact our office and allow 72 hours for refills to be completed.    Today you received the following chemotherapy and/or immunotherapy agents VELCADE      To help prevent nausea and vomiting after your treatment, we encourage you to take your nausea medication as directed.  BELOW ARE SYMPTOMS THAT SHOULD BE REPORTED IMMEDIATELY: *FEVER GREATER THAN 100.4 F (38 C) OR HIGHER *CHILLS OR SWEATING *NAUSEA AND VOMITING THAT IS NOT CONTROLLED WITH YOUR NAUSEA MEDICATION *UNUSUAL SHORTNESS OF BREATH *UNUSUAL BRUISING OR BLEEDING *URINARY PROBLEMS (pain or burning when urinating, or frequent urination) *BOWEL PROBLEMS (unusual diarrhea, constipation, pain near the anus) TENDERNESS IN MOUTH AND THROAT WITH OR WITHOUT PRESENCE OF ULCERS (sore throat, sores in mouth, or a toothache) UNUSUAL RASH, SWELLING OR PAIN  UNUSUAL VAGINAL DISCHARGE OR ITCHING   Items with * indicate a potential emergency and should be followed up as soon as possible or go to the Emergency Department if any problems should occur.  Please show the CHEMOTHERAPY ALERT CARD or IMMUNOTHERAPY ALERT CARD at check-in to the  Emergency Department and triage nurse.  Should you have questions after your visit or need to cancel or reschedule your appointment, please contact [facility] [place] AT [place]  [phone] and follow the prompts.  Office hours are 8:00 a.m. to 4:30 p.m. Monday - Friday. Please note that voicemails left after 4:00 p.m. may not be returned until the following business day.  We are closed weekends and major holidays. You have access to a nurse at all times for urgent questions. Please call the main number to the clinic [phone] and follow the prompts.  For any non-urgent questions, you may also contact your provider using MyChart. We now offer e-Visits for anyone 49 and older to request care online for non-urgent symptoms. For details visit [email or web address].   Also download the MyChart app! Go to the app store, search "MyChart", open the app, select [facility], and log in with your MyChart username and password.  Due to Covid, a mask is required upon entering the hospital/clinic. If you do not have a mask, one will be given to you upon arrival. For doctor visits, patients may have 1 support person aged 13 or older with them. For treatment visits, patients cannot have anyone with them due to current Covid guidelines and our immunocompromised population.   Bortezomib injection What is this medication? BORTEZOMIB (bor TEZ oh mib) targets proteins in cancer cells and stops the cancer cells from growing. It treats multiple myeloma and mantle cell lymphoma. This medicine may be used for other purposes; ask your health care provider or pharmacist if you have questions. COMMON BRAND NAME(S): Velcade What  should I tell my care team before I take this medication? They need to know if you have any of these conditions: dehydration diabetes (high blood sugar) heart disease liver disease tingling of the fingers or toes or other nerve disorder an unusual or allergic reaction to  bortezomib, mannitol, boron, other medicines, foods, dyes, or preservatives pregnant or trying to get pregnant breast-feeding How should I use this medication? This medicine is injected into a vein or under the skin. It is given by a health care provider in a hospital or clinic setting. Talk to your health care provider about the use of this medicine in children. Special care may be needed. Overdosage: If you think you have taken too much of this medicine contact a poison control center or emergency room at once. NOTE: This medicine is only for you. Do not share this medicine with others. What if I miss a dose? Keep appointments for follow-up doses. It is important not to miss your dose. Call your health care provider if you are unable to keep an appointment. What may interact with this medication? This medicine may interact with the following medications: ketoconazole rifampin This list may not describe all possible interactions. Give your health care provider a list of all the medicines, herbs, non-prescription drugs, or dietary supplements you use. Also tell them if you smoke, drink alcohol, or use illegal drugs. Some items may interact with your medicine. What should I watch for while using this medication? Your condition will be monitored carefully while you are receiving this medicine. You may need blood work done while you are taking this medicine. You may get drowsy or dizzy. Do not drive, use machinery, or do anything that needs mental alertness until you know how this medicine affects you. Do not stand up or sit up quickly, especially if you are an older patient. This reduces the risk of dizzy or fainting spells This medicine may increase your risk of getting an infection. Call your health care provider for advice if you get a fever, chills, sore throat, or other symptoms of a cold or flu. Do not treat yourself. Try to avoid being around people who are sick. Check with your health care  provider if you have severe diarrhea, nausea, and vomiting, or if you sweat a lot. The loss of too much body fluid may make it dangerous for you to take this medicine. Do not become pregnant while taking this medicine or for 7 months after stopping it. Women should inform their health care provider if they wish to become pregnant or think they might be pregnant. Men should not father a child while taking this medicine and for 4 months after stopping it. There is a potential for serious harm to an unborn child. Talk to your health care provider for more information. Do not breast-feed an infant while taking this medicine or for 2 months after stopping it. This medicine may make it more difficult to get pregnant or father a child. Talk to your health care provider if you are concerned about your fertility. What side effects may I notice from receiving this medication? Side effects that you should report to your doctor or health care professional as soon as possible: allergic reactions (skin rash; itching or hives; swelling of the face, lips, or tongue) bleeding (bloody or black, tarry stools; red or dark brown urine; spitting up blood or brown material that looks like coffee grounds; red spots on the skin; unusual bruising or bleeding from the eye,  gums, or nose) blurred vision or changes in vision confusion constipation headache heart failure (trouble breathing; fast, irregular heartbeat; sudden weight gain; swelling of the ankles, feet, hands) infection (fever, chills, cough, sore throat, pain or trouble passing urine) lack or loss of appetite liver injury (dark yellow or brown urine; general ill feeling or flu-like symptoms; loss of appetite, right upper belly pain; yellowing of the eyes or skin) low blood pressure (dizziness; feeling faint or lightheaded, falls; unusually weak or tired) muscle cramps pain, redness, or irritation at site where injected pain, tingling, numbness in the hands or  feet seizures trouble breathing unusual bruising or bleeding Side effects that usually do not require medical attention (report to your doctor or health care professional if they continue or are bothersome): diarrhea nausea stomach pain trouble sleeping vomiting This list may not describe all possible side effects. Call your doctor for medical advice about side effects. You may report side effects to FDA at 1-800-FDA-1088. Where should I keep my medication? This medicine is given in a hospital or clinic. It will not be stored at home. NOTE: This sheet is a summary. It may not cover all possible information. If you have questions about this medicine, talk to your doctor, pharmacist, or health care provider.  2022 Elsevier/Gold Standard (2020-03-19 00:00:00)

## 2021-04-15 NOTE — Progress Notes (Signed)
Hematology/Oncology follow up note [phone] [phone]   Patient Care Team: Sofie Hartigan, MD as PCP - General (Family Medicine) Noreene Filbert, MD as Radiation Oncologist (Radiation Oncology)  REFERRING PROVIDER: Sofie Hartigan, MD  CHIEF COMPLAINTS/REASON FOR VISIT:  Follow-up for multiple myeloma HISTORY OF PRESENTING ILLNESS:   Tommy Pavon. is a  66 y.o.  male with PMH listed below was seen in consultation at the request of  Feldpausch, Chrissie Noa, MD  for evaluation of abnormal serum protein electrophoresis.  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  Increase calcium level at 12.3, PTH was normal.  Creatinine 1, estimated GFR 91, CBC showed hemoglobin 12.4, Patient also recently had a CT chest abdomen pelvis with contrast for evaluation of right flank pain and right lower lobe nodule. 01/29/2020, CT showed multiple bilateral pulmonary nodules measuring up to 10 mm in the right middle lobe, nodules are indeterminate, infectious/inflammatory etiology versus metastatic disease. Multiple osseous lytic lesions, largest lesion involving the left pubic bone concerning for metastatic disease. Compression fractures at T9, age indeterminate. Appearance focal area of thickening at the gastroesophageal junction.  Further evaluation with upper GI study or direct visualization with endoscopy is recommended No bowel obstruction. Aortic atherosclerosis.  Patient reports NSAIDs as needed for lower back pain.  Mid lower back pain usually is worse when when he stands up from sitting position.  Patient works in the [employer].  Lives at home with wife.  Denies any unintentional weight loss, fever, chills, night sweating  # IgG multiple myeloma, stage II, del 1 p, high risk Baseline M protein 4.1, kappa light chain level 333.8 Beta-2 microglobulin 2.1, albumin 2.7. 02/12/2020, bone marrow biopsy showed hypercellular for age,  80% plasma cell which are complex restricted by light chain in situ heparinization.  Background hematopoiesis is present but reduced. Cytogenetics is normal, Myeloma FISH panel-deletion 1P,Standard risk. He is not interested in  # 02/27/2020 patient has been started on Revlimid 25 mg D1-14 #02/24/2020- 08/26/2020  Velcade weekly with dexamethasone 20 mg weekly  # 08/14/2020 M protein 0.5, close to 90% reduction from original M protein of 4.1, light chain ration 1.76 # 08/18/2020  Bone marrow results were reviewed.  1% plasma cell.  Normal cytogenetics.  Myeloma FISH negative for 1p del. #09/01/2020, patient agreed for second opinion at [facility] for evaluation and discussion of possible bone marrow transplant. He prefers to hold off maintenance treatment until his [place] appointment.   #Focal area of thickening at the GE junction. 10/23/2020 EGD is normal.   #Lung nodules, measures up to 10 mm on the right middle lobe.  Indeterminate.  Attention on follow-up.  Recommend repeat CT chest and he declined  11/03/2020, M protein 0.6, lambda free light chain 4.9, kappa light chain 14.8, kappa lambda light chain ratio 3.02. Discussed about proceed with 1 cycle of Revlimid 25 mg 2 weeks on 1 week off while waiting for [facility] second opinion.  He agrees with the plan.  12/09/2020 Evaluated by Dr.Gasparetto at [facility].  Recommend patient to resume VRD.  There was plan of bone marrow biopsy to be repeated at [facility]   #03/10/2021 COVID 19 positive.  Seen at emergency room  INTERVAL HISTORY Tommy Humm. is a 66 y.o. male who has above history reviewed by me today presents for follow up visit for multiple myeloma  Chronic intermittent mild numbness and tingling.  Symptoms are stable.  Denies any nausea vomiting diarrhea.  He tolerates chemotherapy well.  Review of Systems  Constitutional:  Negative for appetite change, chills, fatigue, fever and unexpected weight change.  HENT:   Negative for hearing loss and  voice change.   Eyes:  Negative for eye problems and icterus.  Respiratory:  Negative for chest tightness, cough and shortness of breath.   Cardiovascular:  Negative for chest pain and leg swelling.  Gastrointestinal:  Negative for abdominal distention, abdominal pain and constipation.  Endocrine: Negative for hot flashes.  Genitourinary:  Negative for difficulty urinating, dysuria and frequency.   Musculoskeletal:  Negative for arthralgias, back pain and neck pain.  Skin:  Negative for itching and rash.  Neurological:  Positive for numbness. Negative for light-headedness.  Hematological:  Negative for adenopathy. Does not bruise/bleed easily.  Psychiatric/Behavioral:  Negative for confusion.     MEDICAL HISTORY:  Past Medical History:  Diagnosis Date   Cancer ([place])    Depression    Hypercalcemia 03/02/2020   Hyperlipemia    Hypertension    Hypogonadism in male    Multiple myeloma [facility])     SURGICAL HISTORY: Past Surgical History:  Procedure Laterality Date   BONE MARROW BIOPSY     COLONOSCOPY  07/14/2008   ESOPHAGOGASTRODUODENOSCOPY N/A 10/22/2020   Procedure: ESOPHAGOGASTRODUODENOSCOPY (EGD);  Surgeon: Jonathon Bellows, MD;  Location: [facility] ENDOSCOPY;  Service: Gastroenterology;  Laterality: N/A;    SOCIAL HISTORY: Social History   Socioeconomic History   Marital status: Married    Spouse name: Not on file   Number of children: Not on file   Years of education: Not on file   Highest education level: Not on file  Occupational History   Not on file  Tobacco Use   Smoking status: Every Day    Packs/day: 1.00    Years: 30.00    Pack years: 30.00    Types: Cigarettes   Smokeless tobacco: Never  Substance and Sexual Activity   Alcohol use: Yes    Comment: occcassional   Drug use: No   Sexual activity: Not on file  Other Topics Concern   Not on file  Social History Narrative   Not on file   Social Determinants of Health   Financial Resource Strain: Not on file   Food Insecurity: Not on file  Transportation Needs: Not on file  Physical Activity: Not on file  Stress: Not on file  Social Connections: Not on file  Intimate Partner Violence: Not on file    FAMILY HISTORY: Family History  Problem Relation Age of Onset   Skin cancer Mother    Prostate cancer Father     ALLERGIES:  has No Known Allergies.  MEDICATIONS:  Current Outpatient Medications  Medication Sig Dispense Refill   acyclovir (ZOVIRAX) 400 MG tablet TAKE 1 TABLET BY MOUTH TWICE A DAY 180 tablet 1   ASPIRIN 81 PO Take 81 mg by mouth daily.     atorvastatin (LIPITOR) 40 MG tablet Take 40 mg by mouth daily.     buPROPion (WELLBUTRIN XL) 300 MG 24 hr tablet Take 300 mg by mouth daily.     KLOR-CON M20 20 MEQ tablet TAKE 1 TABLET BY MOUTH TWICE A DAY 180 tablet 1   lenalidomide (REVLIMID) 25 MG capsule Take 1 capsule (25 mg total) by mouth daily. Take 14 days on, 7 days off, repeat every 21 days. 14 capsule 0   losartan-hydrochlorothiazide (HYZAAR) 100-12.5 MG tablet Take 1 tablet by mouth daily.     omeprazole (PRILOSEC) 20 MG capsule Take 1 capsule (20 mg  total) by mouth daily. (Patient not taking: Reported on 04/15/2021) 30 capsule 1   polyethylene glycol (MIRALAX) 17 g packet Take 17 g by mouth daily. 14 each 0   Psyllium (METAMUCIL MULTIHEALTH FIBER PO) Take by mouth daily.     senna-docusate (SENNA S) 8.6-50 MG tablet Take 2 tablets by mouth daily. 60 tablet 3   amLODipine (NORVASC) 10 MG tablet Take by mouth daily.     No current facility-administered medications for this visit.     PHYSICAL EXAMINATION: ECOG PERFORMANCE STATUS: 1 - Symptomatic but completely ambulatory Vitals:   04/15/21 0826  BP: 124/82  Pulse: 62  Temp: 98.2 F (36.8 C)   Filed Weights   04/15/21 0826  Weight: 228 lb (103.4 kg)    Physical Exam Constitutional:      General: He is not in acute distress.    Appearance: He is not diaphoretic.  HENT:     Head: Normocephalic and atraumatic.      Nose: Nose normal.     Mouth/Throat:     Pharynx: No oropharyngeal exudate.  Eyes:     General: No scleral icterus.    Pupils: Pupils are equal, round, and reactive to light.  Cardiovascular:     Rate and Rhythm: Normal rate and regular rhythm.     Heart sounds: No murmur heard. Pulmonary:     Effort: Pulmonary effort is normal. No respiratory distress.     Breath sounds: No rales.  Chest:     Chest wall: No tenderness.  Abdominal:     General: There is no distension.     Palpations: Abdomen is soft.     Tenderness: There is no abdominal tenderness.  Musculoskeletal:        General: Normal range of motion.     Cervical back: Normal range of motion and neck supple.  Skin:    General: Skin is warm and dry.     Findings: No erythema.  Neurological:     Mental Status: He is alert and oriented to person, place, and time.     Cranial Nerves: No cranial nerve deficit.     Motor: No abnormal muscle tone.     Coordination: Coordination normal.  Psychiatric:        Mood and Affect: Affect normal.      LABORATORY DATA:  I have reviewed the data as listed Lab Results  Component Value Date   WBC 4.6 04/15/2021   HGB 13.2 04/15/2021   HCT 40.4 04/15/2021   MCV 88.4 04/15/2021   PLT 127 (L) 04/15/2021   Recent Labs    01/26/21 1345 02/02/21 1419 04/01/21 0800 04/08/21 0812 04/15/21 0803  NA 139   < > 136 137 137  K 3.7   < > 3.3* 3.2* 3.4*  CL 103   < > 101 102 106  CO2 31   < > 28 29 27   GLUCOSE 88   < > 88 93 84  BUN 13   < > 11 13 13   CREATININE 0.83   < > 0.94 0.79 0.86  CALCIUM 8.7*   < > 9.2 8.4* 8.6*  GFRNONAA >60   < > >60 >60 >60  PROT 6.2*   < > 6.4* 6.2* 5.9*  ALBUMIN 3.4*   < > 3.7 3.5 3.3*  AST 11*   < > 14* 17 21  ALT 19   < > 16 24 36  ALKPHOS 51   < > 52 45 42  BILITOT 1.4*   < >  0.8 0.7 1.3*  BILIDIR 0.3*  --   --   --   --    < > = values in this interval not displayed.    Iron/TIBC/Ferritin/ %Sat No results found for: IRON, TIBC,  FERRITIN, IRONPCTSAT    RADIOGRAPHIC STUDIES: I have personally reviewed the radiological images as listed and agreed with the findings in the report. CT Chest Wo Contrast  Result Date: 02/18/2021 CLINICAL DATA:  Follow-up lung nodule, multiple myeloma EXAM: CT CHEST WITHOUT CONTRAST TECHNIQUE: Multidetector CT imaging of the chest was performed following the standard protocol without IV contrast. COMPARISON:  01/29/2020 FINDINGS: Cardiovascular: Scattered aortic atherosclerosis. Normal heart size. Scattered left and right coronary artery calcifications. No pericardial effusion. Mediastinum/Nodes: No enlarged mediastinal, hilar, or axillary lymph nodes. Thyroid gland, trachea, and esophagus demonstrate no significant findings. Lungs/Pleura: Mild, bandlike scarring of the bilateral lung bases. Multiple small pulmonary nodules are unchanged, largest again a 1.0 cm nodule of the lateral segment right middle lobe (series 2, image 29). Additional nodules include a 0.6 cm nodule of the peripheral right lower lobe (series 2, image 88). No new nodules. No pleural effusion or pneumothorax. Upper Abdomen: No acute abnormality. Musculoskeletal: Widespread, heterogeneously mottled, lytic appearance of the included osseous structures, with multiple new vertebral body wedging endplate deformities throughout the included lower cervical, thoracic, and upper lumbar spine, new wedge or endplate deformities involving the C7, T3, T7, T10 through T12, and L1 vertebral bodies. Interval worsening of a previously seen wedge deformity of T9. IMPRESSION: 1. Multiple small pulmonary nodules are unchanged, largest again a 1.0 cm nodule of the lateral segment right middle lobe. These are almost certainly incidental and benign sequelae of prior infection or inflammation, especially given that pulmonary involvement is very unusual in multiple myeloma. Continued attention on follow-up if indicated by oncologic imaging protocol. 2.  Widespread, heterogeneously mottled, lytic appearance of the included osseous structures, in keeping with multiple myeloma. 3. Multiple vertebral body wedge and endplate deformities throughout the included lower cervical, thoracic, and upper lumbar spine, new wedge or endplate deformities involving the C7, T3, T7, T10 through T12, and L1 vertebral bodies. Interval worsening of a previously seen wedge deformity of T9. 4. Coronary artery disease. Aortic Atherosclerosis (ICD10-I70.0). Electronically Signed   By: Delanna Ahmadi M.D.   On: 02/18/2021 10:24       ASSESSMENT & PLAN:  1. Multiple myeloma not having achieved remission ([place])   2. Encounter for antineoplastic chemotherapy   3. Lytic bone lesions on xray    Cancer Staging  Multiple myeloma not having achieved remission ([place]) Staging form: Plasma Cell Myeloma and Plasma Cell Disorders, AJCC 8th Edition - Clinical stage from 02/04/2020: Beta-2-microglobulin (mg/L): 2.1, Albumin (g/dL): 2.7, ISS: Stage II, High-risk cytogenetics: Absent, LDH: Unknown - Signed by Earlie Server, MD on 05/06/2020   #IgG multiple myeloma, stage II, del 1 p, high risk.  Declined bone marrow transplant evaluation. 1st treatment with RVD, BM biopsy after 8 cycles showed 1% plasma cells.  COVID 19 + positive 2 weeks ago, omit D15 dose of Velcade for cycle 12.  Labs are reviewed and discussed with patient.  Proceed with cycle 14 Velcade and dexamethasone weekly. Advised patient to start Revlimid 25 mg 2 weeks on 1 week off. Continue aspirin 81 mg daily for DVT prophylaxis. Continue acyclovir for shingles prophylaxis Free light chain ratio 1.59.  Improving.  M spike has decreased to 0.4, plateaued. Plan to repeat bone marrow biopsy. Advised patient to get an appointment with  Dr. Samule Ohm for reevaluation of stem cell transplant.  #Bone lesion,  [place] 03/25/2021 . Continue calcium 1200 mg daily.  # hypokalemia, potassium stable at 3.4, continue potassium chloride  20 mEq daily.  #Lung nodules, repeat CT showed no changes. Likely benign, monitor.  # Grade 1 neuropathy, monitor.   We spent sufficient time to discuss many aspect of care, questions were answered to patient's satisfaction. All questions were answered. The patient knows to call the clinic with any problems questions or concerns.  cc Sofie Hartigan, MD   Return of visit:   Weekly Velcade and Dex x 2 Lab MD Velcade and Dex in 3 weeks.  Bone marrow biopsy for reevaluation of disease status.  Earlie Server, MD, PhD  04/15/2021

## 2021-04-15 NOTE — Progress Notes (Signed)
error 

## 2021-04-16 ENCOUNTER — Other Ambulatory Visit: Payer: Self-pay

## 2021-04-16 DIAGNOSIS — C9000 Multiple myeloma not having achieved remission: Secondary | ICD-10-CM

## 2021-04-16 LAB — KAPPA/LAMBDA LIGHT CHAINS
Kappa free light chain: 11.8 mg/L (ref 3.3–19.4)
Kappa, lambda light chain ratio: 1.34 (ref 0.26–1.65)
Lambda free light chains: 8.8 mg/L (ref 5.7–26.3)

## 2021-04-16 MED ORDER — LENALIDOMIDE 25 MG PO CAPS: 25.0000 mg | ORAL_CAPSULE | Freq: Every day | ORAL | 0 refills | Status: DC

## 2021-04-19 LAB — MULTIPLE MYELOMA PANEL, SERUM
Albumin SerPl Elph-Mcnc: 3.3 g/dL (ref 2.9–4.4)
Albumin/Glob SerPl: 1.6 (ref 0.7–1.7)
Alpha 1: 0.2 g/dL (ref 0.0–0.4)
Alpha2 Glob SerPl Elph-Mcnc: 0.7 g/dL (ref 0.4–1.0)
B-Globulin SerPl Elph-Mcnc: 0.9 g/dL (ref 0.7–1.3)
Gamma Glob SerPl Elph-Mcnc: 0.3 g/dL — ABNORMAL LOW (ref 0.4–1.8)
Globulin, Total: 2.1 g/dL — ABNORMAL LOW (ref 2.2–3.9)
IgA: 115 mg/dL (ref 61–437)
IgG (Immunoglobin G), Serum: 579 mg/dL — ABNORMAL LOW (ref 603–1613)
IgM (Immunoglobulin M), Srm: 19 mg/dL — ABNORMAL LOW (ref 20–172)
M Protein SerPl Elph-Mcnc: 0.3 g/dL — ABNORMAL HIGH
Total Protein ELP: 5.4 g/dL — ABNORMAL LOW (ref 6.0–8.5)

## 2021-04-20 ENCOUNTER — Other Ambulatory Visit: Payer: Self-pay | Admitting: Radiology

## 2021-04-21 ENCOUNTER — Other Ambulatory Visit: Payer: Self-pay

## 2021-04-21 ENCOUNTER — Ambulatory Visit
Admission: RE | Admit: 2021-04-21 | Discharge: 2021-04-21 | Disposition: A | Discharge status: Home / Self Care | Payer: Medicare Other | Source: Ambulatory Visit | Attending: Oncology | Admitting: Oncology

## 2021-04-21 DIAGNOSIS — D696 Thrombocytopenia, unspecified: Secondary | ICD-10-CM | POA: Diagnosis present

## 2021-04-21 DIAGNOSIS — F17210 Nicotine dependence, cigarettes, uncomplicated: Secondary | ICD-10-CM | POA: Diagnosis not present

## 2021-04-21 DIAGNOSIS — C9000 Multiple myeloma not having achieved remission: Secondary | ICD-10-CM | POA: Diagnosis not present

## 2021-04-21 LAB — CBC WITH DIFFERENTIAL/PLATELET
Abs Immature Granulocytes: 0.02 10*3/uL (ref 0.00–0.07)
Basophils Absolute: 0 10*3/uL (ref 0.0–0.1)
Basophils Relative: 0 %
Eosinophils Absolute: 0.4 10*3/uL (ref 0.0–0.5)
Eosinophils Relative: 9 %
HCT: 41.1 % (ref 39.0–52.0)
Hemoglobin: 13.2 g/dL (ref 13.0–17.0)
Immature Granulocytes: 0 %
Lymphocytes Relative: 26 %
Lymphs Abs: 1.2 10*3/uL (ref 0.7–4.0)
MCH: 28.3 pg (ref 26.0–34.0)
MCHC: 32.1 g/dL (ref 30.0–36.0)
MCV: 88 fL (ref 80.0–100.0)
Monocytes Absolute: 0.5 10*3/uL (ref 0.1–1.0)
Monocytes Relative: 11 %
Neutro Abs: 2.4 10*3/uL (ref 1.7–7.7)
Neutrophils Relative %: 54 %
Platelets: 128 10*3/uL — ABNORMAL LOW (ref 150–400)
RBC: 4.67 MIL/uL (ref 4.22–5.81)
RDW: 16.4 % — ABNORMAL HIGH (ref 11.5–15.5)
WBC: 4.6 10*3/uL (ref 4.0–10.5)
nRBC: 0 % (ref 0.0–0.2)

## 2021-04-21 MED ORDER — SODIUM CHLORIDE 0.9 % IV SOLN: INTRAVENOUS | Status: DC

## 2021-04-21 MED ORDER — FENTANYL CITRATE (PF) 100 MCG/2ML IJ SOLN
INTRAMUSCULAR | Status: AC
  Filled 2021-04-21: qty 2

## 2021-04-21 MED ORDER — MIDAZOLAM HCL 2 MG/2ML IJ SOLN
INTRAMUSCULAR | Status: AC
  Filled 2021-04-21: qty 2

## 2021-04-21 MED ORDER — FENTANYL CITRATE (PF) 100 MCG/2ML IJ SOLN
INTRAMUSCULAR | Status: AC | PRN
  Administered 2021-04-21 (×2): 50 ug via INTRAVENOUS

## 2021-04-21 MED ORDER — MIDAZOLAM HCL 2 MG/2ML IJ SOLN
INTRAMUSCULAR | Status: AC | PRN
  Administered 2021-04-21 (×2): 1 mg via INTRAVENOUS

## 2021-04-21 MED ORDER — HEPARIN SOD (PORK) LOCK FLUSH 100 UNIT/ML IV SOLN
INTRAVENOUS | Status: AC
  Filled 2021-04-21: qty 5

## 2021-04-21 NOTE — Procedures (Signed)
Interventional Radiology Procedure Note  Date of Procedure: 04/21/2021  Procedure: BMBx  Findings:  1. CT BMBx right posterior ilium,  core x2   Complications: No immediate complications noted.   Estimated Blood Loss: minimal  Follow-up and Recommendations: 1. Bedrest 1 hour    Albin Felling, MD  Vascular & Interventional Radiology  04/21/2021 9:46 AM

## 2021-04-21 NOTE — H&P (Signed)
Chief Complaint: Patient was seen in consultation today for multiple myeloma at the request of Yu,Zhou  Referring Physician(s): Yu,Zhou  Supervising Physician: Juliet Rude  Patient Status: [facility] - Out-pt  History of Present Illness: Tommy Smith. is a 66 y.o. male with PMHx significant for multiple myeloma who follows with Oncology, Dr. Tasia Catchings last seen 1/5 with request for image guided bone marrow biopsy to evaluate disease status.   The patient has had a H&P performed within the last 30 days, all history, medications, and exam have been reviewed. The patient denies any interval changes since the H&P.  The patient denies any current chest pain or shortness of breath. The patient denies any recent infections, fever or chills. The patient denies any history of sleep apnea or chronic oxygen use. He has no known complications to sedation.    Past Medical History:  Diagnosis Date   Cancer ([place])    Depression    Hypercalcemia 03/02/2020   Hyperlipemia    Hypertension    Hypogonadism in male    Multiple myeloma [facility])     Past Surgical History:  Procedure Laterality Date   BONE MARROW BIOPSY     COLONOSCOPY  07/14/2008   ESOPHAGOGASTRODUODENOSCOPY N/A 10/22/2020   Procedure: ESOPHAGOGASTRODUODENOSCOPY (EGD);  Surgeon: Jonathon Bellows, MD;  Location: [facility] ENDOSCOPY;  Service: Gastroenterology;  Laterality: N/A;    Allergies: Patient has no known allergies.  Medications: Prior to Admission medications   Medication Sig Start Date End Date Taking? Authorizing Provider  acyclovir (ZOVIRAX) 400 MG tablet TAKE 1 TABLET BY MOUTH TWICE A DAY 11/12/20  Yes Earlie Server, MD  ASPIRIN 81 PO Take 81 mg by mouth daily.   Yes [provider]  atorvastatin (LIPITOR) 40 MG tablet Take 40 mg by mouth daily.   Yes [provider]  buPROPion (WELLBUTRIN XL) 300 MG 24 hr tablet Take 300 mg by mouth daily. 11/11/20  Yes [provider]  KLOR-CON M20 20 MEQ tablet TAKE 1  TABLET BY MOUTH TWICE A DAY 10/28/20  Yes Earlie Server, MD  lenalidomide (REVLIMID) 25 MG capsule Take 1 capsule (25 mg total) by mouth daily. Take 14 days on, 7 days off, repeat every 21 days. 04/16/21  Yes Earlie Server, MD  losartan-hydrochlorothiazide (HYZAAR) 100-12.5 MG tablet Take 1 tablet by mouth daily. 01/22/20  Yes [provider]  polyethylene glycol (MIRALAX) 17 g packet Take 17 g by mouth daily. 03/02/20  Yes Earlie Server, MD  amLODipine (NORVASC) 10 MG tablet Take by mouth daily. 05/31/19 01/12/21  [provider]  omeprazole (PRILOSEC) 20 MG capsule Take 1 capsule (20 mg total) by mouth daily. Patient not taking: Reported on 04/15/2021 03/18/20   Earlie Server, MD  Psyllium (METAMUCIL MULTIHEALTH FIBER PO) Take by mouth daily. Patient not taking: Reported on 04/21/2021    [provider]  senna-docusate (SENNA S) 8.6-50 MG tablet Take 2 tablets by mouth daily. Patient not taking: Reported on 04/21/2021 08/05/20   Earlie Server, MD     Family History  Problem Relation Age of Onset   Skin cancer Mother    Prostate cancer Father     Social History   Socioeconomic History   Marital status: Married    Spouse name: [name]   Number of children: 3   Years of education: Not on file   Highest education level: Not on file  Occupational History   Not on file  Tobacco Use   Smoking status: Every Day  Packs/day: 0.25    Years: 30.00    Pack years: 7.50    Types: Cigarettes   Smokeless tobacco: Never  Substance and Sexual Activity   Alcohol use: Yes    Comment: occcassional   Drug use: No   Sexual activity: Not on file  Other Topics Concern   Not on file  Social History Narrative   Not on file   Social Determinants of Health   Financial Resource Strain: Not on file  Food Insecurity: Not on file  Transportation Needs: Not on file  Physical Activity: Not on file  Stress: Not on file  Social Connections: Not on file    Review of Systems: A 12 point ROS discussed and  pertinent positives are indicated in the HPI above.  All other systems are negative.  Review of Systems  Vital Signs: BP 106/76    Pulse 62    Temp 97.9 F (36.6 C) (Oral)    Resp 20    Ht _0  (1.905 m)    Wt 228 lb (103.4 kg)    SpO2 97%    BMI 28.50 kg/m   Physical Exam Constitutional:      Appearance: Normal appearance.  HENT:     Head: Normocephalic and atraumatic.  Cardiovascular:     Rate and Rhythm: Normal rate and regular rhythm.  Pulmonary:     Effort: Pulmonary effort is normal. No respiratory distress.  Skin:    General: Skin is warm and dry.  Neurological:     Mental Status: He is alert and oriented to person, place, and time.    Imaging: No results found.  Labs:  CBC: Recent Labs    04/01/21 0800 04/08/21 0812 04/15/21 0803 04/21/21 0821  WBC 5.9 6.0 4.6 4.6  HGB 13.3 13.1 13.2 13.2  HCT 41.7 41.3 40.4 41.1  PLT 187 154 127* 128*    COAGS: No results for input(s): INR, APTT in the last 8760 hours.  BMP: Recent Labs    03/25/21 0823 04/01/21 0800 04/08/21 0812 04/15/21 0803  NA 138 136 137 137  K 3.5 3.3* 3.2* 3.4*  CL 102 101 102 106  CO2 _1 GLUCOSE 103* 88 93 84  BUN _2 CALCIUM 8.9 9.2 8.4* 8.6*  CREATININE 1.04 0.94 0.79 0.86  GFRNONAA >60 >60 >60 >60    LIVER FUNCTION TESTS: Recent Labs    03/25/21 0823 04/01/21 0800 04/08/21 0812 04/15/21 0803  BILITOT 1.3* 0.8 0.7 1.3*  AST 20 14* 17 21  ALT _3 36  ALKPHOS 50 52 45 42  PROT 6.4* 6.4* 6.2* 5.9*  ALBUMIN 3.7 3.7 3.5 3.3*    Assessment and Plan: 66 year old male with PMHx significant for multiple myeloma who follows with Oncology, Dr. Tasia Catchings last seen 1/5 with request for image guided bone marrow biopsy to evaluate disease status.   The patient has been NPO, labs and vitals have been reviewed.  Risks and benefits of CT guided bone marrow biopsy with moderate sedation was discussed with the patient and/or patient's family including, but not  limited to bleeding, infection, damage to adjacent structures or low yield requiring additional tests.  All of the questions were answered and there is agreement to proceed.  Consent signed and in chart.   Thank you for this interesting consult.  I greatly enjoyed meeting [employer]. and look forward to participating in their care.  A copy of this report was sent  to the requesting provider on this date.  Electronically Signed: Hedy Jacob, PA-C 04/21/2021, 9:10 AM   I spent a total of 15 Minutes in face to face in clinical consultation, greater than 50% of which was counseling/coordinating care for multiple myeloma.

## 2021-04-22 ENCOUNTER — Inpatient Hospital Stay: Payer: Medicare Other

## 2021-04-22 VITALS — BP 111/70 | HR 69 | Temp 97.0°F | Resp 18 | Wt 227.5 lb

## 2021-04-22 DIAGNOSIS — C9000 Multiple myeloma not having achieved remission: Secondary | ICD-10-CM

## 2021-04-22 LAB — CBC WITH DIFFERENTIAL/PLATELET
Abs Immature Granulocytes: 0.02 10*3/uL (ref 0.00–0.07)
Basophils Absolute: 0 10*3/uL (ref 0.0–0.1)
Basophils Relative: 0 %
Eosinophils Absolute: 0.5 10*3/uL (ref 0.0–0.5)
Eosinophils Relative: 10 %
HCT: 41.7 % (ref 39.0–52.0)
Hemoglobin: 13.4 g/dL (ref 13.0–17.0)
Immature Granulocytes: 0 %
Lymphocytes Relative: 26 %
Lymphs Abs: 1.2 10*3/uL (ref 0.7–4.0)
MCH: 28.3 pg (ref 26.0–34.0)
MCHC: 32.1 g/dL (ref 30.0–36.0)
MCV: 88 fL (ref 80.0–100.0)
Monocytes Absolute: 0.4 10*3/uL (ref 0.1–1.0)
Monocytes Relative: 8 %
Neutro Abs: 2.7 10*3/uL (ref 1.7–7.7)
Neutrophils Relative %: 56 %
Platelets: 130 10*3/uL — ABNORMAL LOW (ref 150–400)
RBC: 4.74 MIL/uL (ref 4.22–5.81)
RDW: 16.5 % — ABNORMAL HIGH (ref 11.5–15.5)
WBC: 4.7 10*3/uL (ref 4.0–10.5)
nRBC: 0 % (ref 0.0–0.2)

## 2021-04-22 LAB — COMPREHENSIVE METABOLIC PANEL
ALT: 32 U/L (ref 0–44)
AST: 14 U/L — ABNORMAL LOW (ref 15–41)
Albumin: 3.5 g/dL (ref 3.5–5.0)
Alkaline Phosphatase: 45 U/L (ref 38–126)
Anion gap: 4 — ABNORMAL LOW (ref 5–15)
BUN: 12 mg/dL (ref 8–23)
CO2: 28 mmol/L (ref 22–32)
Calcium: 8.7 mg/dL — ABNORMAL LOW (ref 8.9–10.3)
Chloride: 103 mmol/L (ref 98–111)
Creatinine, Ser: 0.81 mg/dL (ref 0.61–1.24)
GFR, Estimated: >60 mL/min (ref >60)
Glucose, Bld: 78 mg/dL (ref 70–99)
Potassium: 3.5 mmol/L (ref 3.5–5.1)
Sodium: 135 mmol/L (ref 135–145)
Total Bilirubin: 1.1 mg/dL (ref 0.3–1.2)
Total Protein: 6 g/dL — ABNORMAL LOW (ref 6.5–8.1)

## 2021-04-22 MED ORDER — DEXAMETHASONE 4 MG PO TABS
20.0000 mg | ORAL_TABLET | Freq: Once | ORAL | Status: AC
  Administered 2021-04-22: 20 mg via ORAL
  Filled 2021-04-22: qty 5

## 2021-04-22 MED ORDER — BORTEZOMIB CHEMO SQ INJECTION 3.5 MG (2.5MG/ML)
1.3000 mg/m2 | Freq: Once | INTRAMUSCULAR | Status: AC
  Administered 2021-04-22: 3 mg via SUBCUTANEOUS
  Filled 2021-04-22: qty 1.2

## 2021-04-22 NOTE — Patient Instructions (Signed)
[facility]  Discharge Instructions: °Thank you for choosing [facility] to provide your oncology and hematology care.  °If you have a lab appointment with the Cancer Center, please go directly to the Cancer Center and check in at the registration area. ° °Wear comfortable clothing and clothing appropriate for easy access to any Portacath or PICC line.  ° °We strive to give you quality time with your provider. You may need to reschedule your appointment if you arrive late (15 or more minutes).  Arriving late affects you and other patients whose appointments are after yours.  Also, if you miss three or more appointments without notifying the office, you may be dismissed from the clinic at the provider’s discretion.    °  °For prescription refill requests, have your pharmacy contact our office and allow 72 hours for refills to be completed.   ° °Today you received the following chemotherapy and/or immunotherapy agents     °  °To help prevent nausea and vomiting after your treatment, we encourage you to take your nausea medication as directed. ° °BELOW ARE SYMPTOMS THAT SHOULD BE REPORTED IMMEDIATELY: °*FEVER GREATER THAN 100.4 F (38 °C) OR HIGHER °*CHILLS OR SWEATING °*NAUSEA AND VOMITING THAT IS NOT CONTROLLED WITH YOUR NAUSEA MEDICATION °*UNUSUAL SHORTNESS OF BREATH °*UNUSUAL BRUISING OR BLEEDING °*URINARY PROBLEMS (pain or burning when urinating, or frequent urination) °*BOWEL PROBLEMS (unusual diarrhea, constipation, pain near the anus) °TENDERNESS IN MOUTH AND THROAT WITH OR WITHOUT PRESENCE OF ULCERS (sore throat, sores in mouth, or a toothache) °UNUSUAL RASH, SWELLING OR PAIN  °UNUSUAL VAGINAL DISCHARGE OR ITCHING  ° °Items with * indicate a potential emergency and should be followed up as soon as possible or go to the Emergency Department if any problems should occur. ° °Please show the CHEMOTHERAPY ALERT CARD or IMMUNOTHERAPY ALERT CARD at check-in to the  Emergency Department and triage nurse. ° °Should you have questions after your visit or need to cancel or reschedule your appointment, please contact [facility]  [phone] and follow the prompts.  Office hours are 8:00 a.m. to 4:30 p.m. Monday - Friday. Please note that voicemails left after 4:00 p.m. may not be returned until the following business day.  We are closed weekends and major holidays. You have access to a nurse at all times for urgent questions. Please call the main number to the clinic [phone] and follow the prompts. ° °For any non-urgent questions, you may also contact your provider using MyChart. We now offer e-Visits for anyone 18 and older to request care online for non-urgent symptoms. For details visit [email or web address]. °  °Also download the MyChart app! Go to the app store, search "MyChart", open the app, select [place], and log in with your MyChart username and password. ° °Due to Covid, a mask is required upon entering the hospital/clinic. If you do not have a mask, one will be given to you upon arrival. For doctor visits, patients may have 1 support person aged 18 or older with them. For treatment visits, patients cannot have anyone with them due to current Covid guidelines and our immunocompromised population.  °

## 2021-04-23 LAB — SURGICAL PATHOLOGY

## 2021-04-26 ENCOUNTER — Encounter (HOSPITAL_COMMUNITY): Payer: Self-pay | Admitting: Oncology

## 2021-04-29 ENCOUNTER — Other Ambulatory Visit: Payer: Self-pay

## 2021-04-29 ENCOUNTER — Inpatient Hospital Stay: Payer: Medicare Other

## 2021-04-29 VITALS — BP 111/65 | HR 71 | Temp 98.2°F | Resp 18 | Wt 223.8 lb

## 2021-04-29 DIAGNOSIS — C9000 Multiple myeloma not having achieved remission: Secondary | ICD-10-CM

## 2021-04-29 LAB — COMPREHENSIVE METABOLIC PANEL
ALT: 27 U/L (ref 0–44)
AST: 21 U/L (ref 15–41)
Albumin: 3.8 g/dL (ref 3.5–5.0)
Alkaline Phosphatase: 44 U/L (ref 38–126)
Anion gap: 5 (ref 5–15)
BUN: 10 mg/dL (ref 8–23)
CO2: 26 mmol/L (ref 22–32)
Calcium: 8.6 mg/dL — ABNORMAL LOW (ref 8.9–10.3)
Chloride: 106 mmol/L (ref 98–111)
Creatinine, Ser: 0.93 mg/dL (ref 0.61–1.24)
GFR, Estimated: >60 mL/min (ref >60)
Glucose, Bld: 99 mg/dL (ref 70–99)
Potassium: 3.7 mmol/L (ref 3.5–5.1)
Sodium: 137 mmol/L (ref 135–145)
Total Bilirubin: 1.2 mg/dL (ref 0.3–1.2)
Total Protein: 5.9 g/dL — ABNORMAL LOW (ref 6.5–8.1)

## 2021-04-29 LAB — CBC WITH DIFFERENTIAL/PLATELET
Abs Immature Granulocytes: 0.02 10*3/uL (ref 0.00–0.07)
Basophils Absolute: 0 10*3/uL (ref 0.0–0.1)
Basophils Relative: 0 %
Eosinophils Absolute: 0.4 10*3/uL (ref 0.0–0.5)
Eosinophils Relative: 10 %
HCT: 42 % (ref 39.0–52.0)
Hemoglobin: 13.6 g/dL (ref 13.0–17.0)
Immature Granulocytes: 0 %
Lymphocytes Relative: 24 %
Lymphs Abs: 1.1 10*3/uL (ref 0.7–4.0)
MCH: 28.6 pg (ref 26.0–34.0)
MCHC: 32.4 g/dL (ref 30.0–36.0)
MCV: 88.2 fL (ref 80.0–100.0)
Monocytes Absolute: 0.7 10*3/uL (ref 0.1–1.0)
Monocytes Relative: 15 %
Neutro Abs: 2.3 10*3/uL (ref 1.7–7.7)
Neutrophils Relative %: 51 %
Platelets: 157 10*3/uL (ref 150–400)
RBC: 4.76 MIL/uL (ref 4.22–5.81)
RDW: 16 % — ABNORMAL HIGH (ref 11.5–15.5)
WBC: 4.5 10*3/uL (ref 4.0–10.5)
nRBC: 0 % (ref 0.0–0.2)

## 2021-04-29 MED ORDER — DEXAMETHASONE 4 MG PO TABS
20.0000 mg | ORAL_TABLET | Freq: Once | ORAL | Status: AC
  Administered 2021-04-29: 20 mg via ORAL
  Filled 2021-04-29: qty 5

## 2021-04-29 MED ORDER — BORTEZOMIB CHEMO SQ INJECTION 3.5 MG (2.5MG/ML)
1.3000 mg/m2 | Freq: Once | INTRAMUSCULAR | Status: AC
  Administered 2021-04-29: 3 mg via SUBCUTANEOUS
  Filled 2021-04-29: qty 1.2

## 2021-04-29 NOTE — Patient Instructions (Signed)
[facility]  Discharge Instructions: °Thank you for choosing [facility] to provide your oncology and hematology care.  °If you have a lab appointment with the Cancer Center, please go directly to the Cancer Center and check in at the registration area. ° °Wear comfortable clothing and clothing appropriate for easy access to any Portacath or PICC line.  ° °We strive to give you quality time with your provider. You may need to reschedule your appointment if you arrive late (15 or more minutes).  Arriving late affects you and other patients whose appointments are after yours.  Also, if you miss three or more appointments without notifying the office, you may be dismissed from the clinic at the provider’s discretion.    °  °For prescription refill requests, have your pharmacy contact our office and allow 72 hours for refills to be completed.   ° °Today you received the following chemotherapy and/or immunotherapy agents: Velcade    °  °To help prevent nausea and vomiting after your treatment, we encourage you to take your nausea medication as directed. ° °BELOW ARE SYMPTOMS THAT SHOULD BE REPORTED IMMEDIATELY: °*FEVER GREATER THAN 100.4 F (38 °C) OR HIGHER °*CHILLS OR SWEATING °*NAUSEA AND VOMITING THAT IS NOT CONTROLLED WITH YOUR NAUSEA MEDICATION °*UNUSUAL SHORTNESS OF BREATH °*UNUSUAL BRUISING OR BLEEDING °*URINARY PROBLEMS (pain or burning when urinating, or frequent urination) °*BOWEL PROBLEMS (unusual diarrhea, constipation, pain near the anus) °TENDERNESS IN MOUTH AND THROAT WITH OR WITHOUT PRESENCE OF ULCERS (sore throat, sores in mouth, or a toothache) °UNUSUAL RASH, SWELLING OR PAIN  °UNUSUAL VAGINAL DISCHARGE OR ITCHING  ° °Items with * indicate a potential emergency and should be followed up as soon as possible or go to the Emergency Department if any problems should occur. ° °Please show the CHEMOTHERAPY ALERT CARD or IMMUNOTHERAPY ALERT CARD at check-in to  the Emergency Department and triage nurse. ° °Should you have questions after your visit or need to cancel or reschedule your appointment, please contact [facility]  [phone] and follow the prompts.  Office hours are 8:00 a.m. to 4:30 p.m. Monday - Friday. Please note that voicemails left after 4:00 p.m. may not be returned until the following business day.  We are closed weekends and major holidays. You have access to a nurse at all times for urgent questions. Please call the main number to the clinic [phone] and follow the prompts. ° °For any non-urgent questions, you may also contact your provider using MyChart. We now offer e-Visits for anyone 18 and older to request care online for non-urgent symptoms. For details visit [email or web address]. °  °Also download the MyChart app! Go to the app store, search "MyChart", open the app, select [place], and log in with your MyChart username and password. ° °Due to Covid, a mask is required upon entering the hospital/clinic. If you do not have a mask, one will be given to you upon arrival. For doctor visits, patients may have 1 support person aged 18 or older with them. For treatment visits, patients cannot have anyone with them due to current Covid guidelines and our immunocompromised population. Bortezomib injection °What is this medication? °BORTEZOMIB (bor TEZ oh mib) targets proteins in cancer cells and stops the cancer cells from growing. It treats multiple myeloma and mantle cell lymphoma. °This medicine may be used for other purposes; ask your health care provider or pharmacist if you have questions. °COMMON BRAND NAME(S): Velcade °What should I   tell my care team before I take this medication? °They need to know if you have any of these conditions: °dehydration °diabetes (high blood sugar) °heart disease °liver disease °tingling of the fingers or toes or other nerve disorder °an unusual or allergic reaction to  bortezomib, mannitol, boron, other medicines, foods, dyes, or preservatives °pregnant or trying to get pregnant °breast-feeding °How should I use this medication? °This medicine is injected into a vein or under the skin. It is given by a health care provider in a hospital or clinic setting. °Talk to your health care provider about the use of this medicine in children. Special care may be needed. °Overdosage: If you think you have taken too much of this medicine contact a poison control center or emergency room at once. °NOTE: This medicine is only for you. Do not share this medicine with others. °What if I miss a dose? °Keep appointments for follow-up doses. It is important not to miss your dose. Call your health care provider if you are unable to keep an appointment. °What may interact with this medication? °This medicine may interact with the following medications: °ketoconazole °rifampin °This list may not describe all possible interactions. Give your health care provider a list of all the medicines, herbs, non-prescription drugs, or dietary supplements you use. Also tell them if you smoke, drink alcohol, or use illegal drugs. Some items may interact with your medicine. °What should I watch for while using this medication? °Your condition will be monitored carefully while you are receiving this medicine. °You may need blood work done while you are taking this medicine. °You may get drowsy or dizzy. Do not drive, use machinery, or do anything that needs mental alertness until you know how this medicine affects you. Do not stand up or sit up quickly, especially if you are an older patient. This reduces the risk of dizzy or fainting spells °This medicine may increase your risk of getting an infection. Call your health care provider for advice if you get a fever, chills, sore throat, or other symptoms of a cold or flu. Do not treat yourself. Try to avoid being around people who are sick. °Check with your health care  provider if you have severe diarrhea, nausea, and vomiting, or if you sweat a lot. The loss of too much body fluid may make it dangerous for you to take this medicine. °Do not become pregnant while taking this medicine or for 7 months after stopping it. Women should inform their health care provider if they wish to become pregnant or think they might be pregnant. Men should not father a child while taking this medicine and for 4 months after stopping it. There is a potential for serious harm to an unborn child. Talk to your health care provider for more information. Do not breast-feed an infant while taking this medicine or for 2 months after stopping it. °This medicine may make it more difficult to get pregnant or father a child. Talk to your health care provider if you are concerned about your fertility. °What side effects may I notice from receiving this medication? °Side effects that you should report to your doctor or health care professional as soon as possible: °allergic reactions (skin rash; itching or hives; swelling of the face, lips, or tongue) °bleeding (bloody or black, tarry stools; red or dark brown urine; spitting up blood or brown material that looks like coffee grounds; red spots on the skin; unusual bruising or bleeding from the eye, gums, or   nose) °blurred vision or changes in vision °confusion °constipation °headache °heart failure (trouble breathing; fast, irregular heartbeat; sudden weight gain; swelling of the ankles, feet, hands) °infection (fever, chills, cough, sore throat, pain or trouble passing urine) °lack or loss of appetite °liver injury (dark yellow or brown urine; general ill feeling or flu-like symptoms; loss of appetite, right upper belly pain; yellowing of the eyes or skin) °low blood pressure (dizziness; feeling faint or lightheaded, falls; unusually weak or tired) °muscle cramps °pain, redness, or irritation at site where injected °pain, tingling, numbness in the hands or  feet °seizures °trouble breathing °unusual bruising or bleeding °Side effects that usually do not require medical attention (report to your doctor or health care professional if they continue or are bothersome): °diarrhea °nausea °stomach pain °trouble sleeping °vomiting °This list may not describe all possible side effects. Call your doctor for medical advice about side effects. You may report side effects to FDA at 1-800-FDA-1088. °Where should I keep my medication? °This medicine is given in a hospital or clinic. It will not be stored at home. °NOTE: This sheet is a summary. It may not cover all possible information. If you have questions about this medicine, talk to your doctor, pharmacist, or health care provider. °© 2022 [employer]/Gold Standard (2020-03-19 00:00:00) ° °

## 2021-05-06 ENCOUNTER — Encounter: Payer: Self-pay | Admitting: Oncology

## 2021-05-06 ENCOUNTER — Inpatient Hospital Stay: Payer: Medicare Other | Admitting: Oncology

## 2021-05-06 ENCOUNTER — Other Ambulatory Visit: Payer: Self-pay

## 2021-05-06 ENCOUNTER — Inpatient Hospital Stay: Payer: Medicare Other

## 2021-05-06 VITALS — BP 115/74 | HR 69 | Temp 97.8°F | Wt 229.0 lb

## 2021-05-06 DIAGNOSIS — E876 Hypokalemia: Secondary | ICD-10-CM | POA: Diagnosis not present

## 2021-05-06 DIAGNOSIS — M899 Disorder of bone, unspecified: Secondary | ICD-10-CM

## 2021-05-06 DIAGNOSIS — C9000 Multiple myeloma not having achieved remission: Secondary | ICD-10-CM

## 2021-05-06 DIAGNOSIS — Z5111 Encounter for antineoplastic chemotherapy: Secondary | ICD-10-CM

## 2021-05-06 LAB — CBC WITH DIFFERENTIAL/PLATELET
Abs Immature Granulocytes: 0.01 10*3/uL (ref 0.00–0.07)
Basophils Absolute: 0 10*3/uL (ref 0.0–0.1)
Basophils Relative: 0 %
Eosinophils Absolute: 0.2 10*3/uL (ref 0.0–0.5)
Eosinophils Relative: 4 %
HCT: 41.1 % (ref 39.0–52.0)
Hemoglobin: 13.6 g/dL (ref 13.0–17.0)
Immature Granulocytes: 0 %
Lymphocytes Relative: 28 %
Lymphs Abs: 1.2 10*3/uL (ref 0.7–4.0)
MCH: 28.9 pg (ref 26.0–34.0)
MCHC: 33.1 g/dL (ref 30.0–36.0)
MCV: 87.4 fL (ref 80.0–100.0)
Monocytes Absolute: 0.8 10*3/uL (ref 0.1–1.0)
Monocytes Relative: 18 %
Neutro Abs: 2.2 10*3/uL (ref 1.7–7.7)
Neutrophils Relative %: 50 %
Platelets: 125 10*3/uL — ABNORMAL LOW (ref 150–400)
RBC: 4.7 MIL/uL (ref 4.22–5.81)
RDW: 15.9 % — ABNORMAL HIGH (ref 11.5–15.5)
WBC: 4.4 10*3/uL (ref 4.0–10.5)
nRBC: 0 % (ref 0.0–0.2)

## 2021-05-06 LAB — COMPREHENSIVE METABOLIC PANEL
ALT: 41 U/L (ref 0–44)
AST: 22 U/L (ref 15–41)
Albumin: 3.7 g/dL (ref 3.5–5.0)
Alkaline Phosphatase: 38 U/L (ref 38–126)
Anion gap: 8 (ref 5–15)
BUN: 11 mg/dL (ref 8–23)
CO2: 26 mmol/L (ref 22–32)
Calcium: 8.7 mg/dL — ABNORMAL LOW (ref 8.9–10.3)
Chloride: 106 mmol/L (ref 98–111)
Creatinine, Ser: 0.89 mg/dL (ref 0.61–1.24)
GFR, Estimated: >60 mL/min (ref >60)
Glucose, Bld: 78 mg/dL (ref 70–99)
Potassium: 3.5 mmol/L (ref 3.5–5.1)
Sodium: 140 mmol/L (ref 135–145)
Total Bilirubin: 1.3 mg/dL — ABNORMAL HIGH (ref 0.3–1.2)
Total Protein: 5.9 g/dL — ABNORMAL LOW (ref 6.5–8.1)

## 2021-05-06 MED ORDER — BORTEZOMIB CHEMO SQ INJECTION 3.5 MG (2.5MG/ML)
1.3000 mg/m2 | Freq: Once | INTRAMUSCULAR | Status: AC
  Administered 2021-05-06: 3 mg via SUBCUTANEOUS
  Filled 2021-05-06: qty 1.2

## 2021-05-06 MED ORDER — DEXAMETHASONE 4 MG PO TABS
20.0000 mg | ORAL_TABLET | Freq: Once | ORAL | Status: AC
  Administered 2021-05-06: 20 mg via ORAL
  Filled 2021-05-06: qty 5

## 2021-05-06 NOTE — Patient Instructions (Signed)
[facility] [place]  Discharge Instructions: ?Thank you for choosing [facility] to provide your oncology and hematology care.  ?If you have a lab appointment with the Cancer Center, please go directly to the Cancer Center and check in at the registration area. ? ?Wear comfortable clothing and clothing appropriate for easy access to any Portacath or PICC line.  ? ?We strive to give you quality time with your provider. You may need to reschedule your appointment if you arrive late (15 or more minutes).  Arriving late affects you and other patients whose appointments are after yours.  Also, if you miss three or more appointments without notifying the office, you may be dismissed from the clinic at the provider?s discretion.    ?  ?For prescription refill requests, have your pharmacy contact our office and allow 72 hours for refills to be completed.   ? ?Today you received the following chemotherapy and/or immunotherapy agents Velcade    ?  ?To help prevent nausea and vomiting after your treatment, we encourage you to take your nausea medication as directed. ? ?BELOW ARE SYMPTOMS THAT SHOULD BE REPORTED IMMEDIATELY: ?*FEVER GREATER THAN 100.4 F (38 ?C) OR HIGHER ?*CHILLS OR SWEATING ?*NAUSEA AND VOMITING THAT IS NOT CONTROLLED WITH YOUR NAUSEA MEDICATION ?*UNUSUAL SHORTNESS OF BREATH ?*UNUSUAL BRUISING OR BLEEDING ?*URINARY PROBLEMS (pain or burning when urinating, or frequent urination) ?*BOWEL PROBLEMS (unusual diarrhea, constipation, pain near the anus) ?TENDERNESS IN MOUTH AND THROAT WITH OR WITHOUT PRESENCE OF ULCERS (sore throat, sores in mouth, or a toothache) ?UNUSUAL RASH, SWELLING OR PAIN  ?UNUSUAL VAGINAL DISCHARGE OR ITCHING  ? ?Items with * indicate a potential emergency and should be followed up as soon as possible or go to the Emergency Department if any problems should occur. ? ?Please show the CHEMOTHERAPY ALERT CARD or IMMUNOTHERAPY ALERT CARD at check-in to the  Emergency Department and triage nurse. ? ?Should you have questions after your visit or need to cancel or reschedule your appointment, please contact [facility] [place]  [phone] and follow the prompts.  Office hours are 8:00 a.m. to 4:30 p.m. Monday - Friday. Please note that voicemails left after 4:00 p.m. may not be returned until the following business day.  We are closed weekends and major holidays. You have access to a nurse at all times for urgent questions. Please call the main number to the clinic [phone] and follow the prompts. ? ?For any non-urgent questions, you may also contact your provider using MyChart. We now offer e-Visits for anyone 18 and older to request care online for non-urgent symptoms. For details visit [email or web address]. ?  ?Also download the MyChart app! Go to the app store, search "MyChart", open the app, select [place], and log in with your MyChart username and password. ? ?Due to Covid, a mask is required upon entering the hospital/clinic. If you do not have a mask, one will be given to you upon arrival. For doctor visits, patients may have 1 support person aged 18 or older with them. For treatment visits, patients cannot have anyone with them due to current Covid guidelines and our immunocompromised population.  ?

## 2021-05-06 NOTE — Progress Notes (Signed)
Hematology/Oncology follow up note [phone] [phone]   Patient Care Team: Sofie Hartigan, MD as PCP - General (Family Medicine) Noreene Filbert, MD as Radiation Oncologist (Radiation Oncology)  REFERRING PROVIDER: Sofie Hartigan, MD  CHIEF COMPLAINTS/REASON FOR VISIT:  Follow-up for multiple myeloma HISTORY OF PRESENTING ILLNESS:   Tommy Smith. is a  66 y.o.  male with PMH listed below was seen in consultation at the request of  Feldpausch, Chrissie Noa, MD  for evaluation of abnormal serum protein electrophoresis.  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  Increase calcium level at 12.3, PTH was normal.  Creatinine 1, estimated GFR 91, CBC showed hemoglobin 12.4, Patient also recently had a CT chest abdomen pelvis with contrast for evaluation of right flank pain and right lower lobe nodule. 01/29/2020, CT showed multiple bilateral pulmonary nodules measuring up to 10 mm in the right middle lobe, nodules are indeterminate, infectious/inflammatory etiology versus metastatic disease. Multiple osseous lytic lesions, largest lesion involving the left pubic bone concerning for metastatic disease. Compression fractures at T9, age indeterminate. Appearance focal area of thickening at the gastroesophageal junction.  Further evaluation with upper GI study or direct visualization with endoscopy is recommended No bowel obstruction. Aortic atherosclerosis.  Patient reports NSAIDs as needed for lower back pain.  Mid lower back pain usually is worse when when he stands up from sitting position.  Patient works in the [employer].  Lives at home with wife.  Denies any unintentional weight loss, fever, chills, night sweating  # IgG multiple myeloma, stage II, del 1 p, high risk Baseline M protein 4.1, kappa light chain level 333.8 Beta-2 microglobulin 2.1, albumin 2.7. 02/12/2020, bone marrow biopsy showed hypercellular for age,  80% plasma cell which are complex restricted by light chain in situ heparinization.  Background hematopoiesis is present but reduced. Cytogenetics is normal, Myeloma FISH panel-deletion 1P,Standard risk. He is not interested in  # 02/27/2020 patient has been started on Revlimid 25 mg D1-14 #02/24/2020- 08/26/2020  Velcade weekly with dexamethasone 20 mg weekly  # 08/14/2020 M protein 0.5, close to 90% reduction from original M protein of 4.1, light chain ration 1.76 # 08/18/2020  Bone marrow results were reviewed.  1% plasma cell.  Normal cytogenetics.  Myeloma FISH negative for 1p del. #09/01/2020, patient agreed for second opinion at [facility] for evaluation and discussion of possible bone marrow transplant. He prefers to hold off maintenance treatment until his [place] appointment.   #Focal area of thickening at the GE junction. 10/23/2020 EGD is normal.   #Lung nodules, measures up to 10 mm on the right middle lobe.  Indeterminate.  Attention on follow-up.  Recommend repeat CT chest and he declined  11/03/2020, M protein 0.6, lambda free light chain 4.9, kappa light chain 14.8, kappa lambda light chain ratio 3.02. Discussed about proceed with 1 cycle of Revlimid 25 mg 2 weeks on 1 week off while waiting for [facility] second opinion.  He agrees with the plan.  12/09/2020 Evaluated by Dr.Gasparetto at [facility].  Recommend patient to resume VRD.  There was plan of bone marrow biopsy to be repeated at [facility].   #03/10/2021 COVID 19 positive.  Seen at emergency room, omit D15 dose of Velcade for cycle 12.   INTERVAL HISTORY Tommy Smith. is a 66 y.o. male who has above history reviewed by me today presents for follow up visit for multiple myeloma  Chronic intermittent mild numbness and tingling.  Symptoms are stable. During the interval, patient  has been seen by [facility] oncology.  Patient will need to do additional assessment prior to bone marrow transplant.  For now plan to continue current regimen.   Review of  Systems  Constitutional:  Negative for appetite change, chills, fatigue, fever and unexpected weight change.  HENT:   Negative for hearing loss and voice change.   Eyes:  Negative for eye problems and icterus.  Respiratory:  Negative for chest tightness, cough and shortness of breath.   Cardiovascular:  Negative for chest pain and leg swelling.  Gastrointestinal:  Negative for abdominal distention, abdominal pain and constipation.  Endocrine: Negative for hot flashes.  Genitourinary:  Negative for difficulty urinating, dysuria and frequency.   Musculoskeletal:  Negative for arthralgias, back pain and neck pain.  Skin:  Negative for itching and rash.  Neurological:  Positive for numbness. Negative for light-headedness.  Hematological:  Negative for adenopathy. Does not bruise/bleed easily.  Psychiatric/Behavioral:  Negative for confusion.     MEDICAL HISTORY:  Past Medical History:  Diagnosis Date   Cancer ([place])    Depression    Hypercalcemia 03/02/2020   Hyperlipemia    Hypertension    Hypogonadism in male    Multiple myeloma [facility])     SURGICAL HISTORY: Past Surgical History:  Procedure Laterality Date   BONE MARROW BIOPSY     COLONOSCOPY  07/14/2008   ESOPHAGOGASTRODUODENOSCOPY N/A 10/22/2020   Procedure: ESOPHAGOGASTRODUODENOSCOPY (EGD);  Surgeon: Jonathon Bellows, MD;  Location: [facility] ENDOSCOPY;  Service: Gastroenterology;  Laterality: N/A;    SOCIAL HISTORY: Social History   Socioeconomic History   Marital status: Married    Spouse name: [name]   Number of children: 3   Years of education: Not on file   Highest education level: Not on file  Occupational History   Not on file  Tobacco Use   Smoking status: Every Day    Packs/day: 0.25    Years: 30.00    Pack years: 7.50    Types: Cigarettes   Smokeless tobacco: Never  Substance and Sexual Activity   Alcohol use: Yes    Comment: occcassional   Drug use: No   Sexual activity: Not on file  Other Topics Concern    Not on file  Social History Narrative   Not on file   Social Determinants of Health   Financial Resource Strain: Not on file  Food Insecurity: Not on file  Transportation Needs: Not on file  Physical Activity: Not on file  Stress: Not on file  Social Connections: Not on file  Intimate Partner Violence: Not on file    FAMILY HISTORY: Family History  Problem Relation Age of Onset   Skin cancer Mother    Prostate cancer Father     ALLERGIES:  has No Known Allergies.  MEDICATIONS:  Current Outpatient Medications  Medication Sig Dispense Refill   acyclovir (ZOVIRAX) 400 MG tablet TAKE 1 TABLET BY MOUTH TWICE A DAY 180 tablet 1   ASPIRIN 81 PO Take 81 mg by mouth daily.     atorvastatin (LIPITOR) 40 MG tablet Take 40 mg by mouth daily.     buPROPion (WELLBUTRIN XL) 300 MG 24 hr tablet Take 300 mg by mouth daily.     KLOR-CON M20 20 MEQ tablet TAKE 1 TABLET BY MOUTH TWICE A DAY 180 tablet 1   lenalidomide (REVLIMID) 25 MG capsule Take 1 capsule (25 mg total) by mouth daily. Take 14 days on, 7 days off, repeat every 21 days. 14 capsule 0  losartan-hydrochlorothiazide (HYZAAR) 100-12.5 MG tablet Take 1 tablet by mouth daily.     amLODipine (NORVASC) 10 MG tablet Take by mouth daily.     omeprazole (PRILOSEC) 20 MG capsule Take 1 capsule (20 mg total) by mouth daily. (Patient not taking: Reported on 04/15/2021) 30 capsule 1   polyethylene glycol (MIRALAX) 17 g packet Take 17 g by mouth daily. (Patient not taking: Reported on 05/06/2021) 14 each 0   Psyllium (METAMUCIL MULTIHEALTH FIBER PO) Take by mouth daily. (Patient not taking: Reported on 04/21/2021)     senna-docusate (SENNA S) 8.6-50 MG tablet Take 2 tablets by mouth daily. (Patient not taking: Reported on 04/21/2021) 60 tablet 3   No current facility-administered medications for this visit.     PHYSICAL EXAMINATION: ECOG PERFORMANCE STATUS: 1 - Symptomatic but completely ambulatory Vitals:   05/06/21 0902  BP: 115/74   Pulse: 69  Temp: 97.8 F (36.6 C)   Filed Weights   05/06/21 0902  Weight: 229 lb (103.9 kg)    Physical Exam Constitutional:      General: He is not in acute distress.    Appearance: He is not diaphoretic.  HENT:     Head: Normocephalic and atraumatic.     Nose: Nose normal.     Mouth/Throat:     Pharynx: No oropharyngeal exudate.  Eyes:     General: No scleral icterus.    Pupils: Pupils are equal, round, and reactive to light.  Cardiovascular:     Rate and Rhythm: Normal rate and regular rhythm.     Heart sounds: No murmur heard. Pulmonary:     Effort: Pulmonary effort is normal. No respiratory distress.     Breath sounds: No rales.  Chest:     Chest wall: No tenderness.  Abdominal:     General: There is no distension.     Palpations: Abdomen is soft.     Tenderness: There is no abdominal tenderness.  Musculoskeletal:        General: Normal range of motion.     Cervical back: Normal range of motion and neck supple.  Skin:    General: Skin is warm and dry.     Findings: No erythema.  Neurological:     Mental Status: He is alert and oriented to person, place, and time.     Cranial Nerves: No cranial nerve deficit.     Motor: No abnormal muscle tone.     Coordination: Coordination normal.  Psychiatric:        Mood and Affect: Affect normal.      LABORATORY DATA:  I have reviewed the data as listed Lab Results  Component Value Date   WBC 4.4 05/06/2021   HGB 13.6 05/06/2021   HCT 41.1 05/06/2021   MCV 87.4 05/06/2021   PLT 125 (L) 05/06/2021   Recent Labs    01/26/21 1345 02/02/21 1419 04/22/21 0918 04/29/21 0920 05/06/21 0827  NA 139   < > 135 137 140  K 3.7   < > 3.5 3.7 3.5  CL 103   < > 103 106 106  CO2 31   < > 28 26 26   GLUCOSE 88   < > 78 99 78  BUN 13   < > 12 10 11   CREATININE 0.83   < > 0.81 0.93 0.89  CALCIUM 8.7*   < > 8.7* 8.6* 8.7*  GFRNONAA >60   < > >60 >60 >60  PROT 6.2*   < > 6.0* 5.9* 5.9*  ALBUMIN 3.4*   < > 3.5 3.8 3.7   AST 11*   < > 14* 21 22  ALT 19   < > 32 27 41  ALKPHOS 51   < > 45 44 38  BILITOT 1.4*   < > 1.1 1.2 1.3*  BILIDIR 0.3*  --   --   --   --    < > = values in this interval not displayed.    Iron/TIBC/Ferritin/ %Sat No results found for: IRON, TIBC, FERRITIN, IRONPCTSAT    RADIOGRAPHIC STUDIES: I have personally reviewed the radiological images as listed and agreed with the findings in the report. CT Chest Wo Contrast  Result Date: 02/18/2021 CLINICAL DATA:  Follow-up lung nodule, multiple myeloma EXAM: CT CHEST WITHOUT CONTRAST TECHNIQUE: Multidetector CT imaging of the chest was performed following the standard protocol without IV contrast. COMPARISON:  01/29/2020 FINDINGS: Cardiovascular: Scattered aortic atherosclerosis. Normal heart size. Scattered left and right coronary artery calcifications. No pericardial effusion. Mediastinum/Nodes: No enlarged mediastinal, hilar, or axillary lymph nodes. Thyroid gland, trachea, and esophagus demonstrate no significant findings. Lungs/Pleura: Mild, bandlike scarring of the bilateral lung bases. Multiple small pulmonary nodules are unchanged, largest again a 1.0 cm nodule of the lateral segment right middle lobe (series 2, image 29). Additional nodules include a 0.6 cm nodule of the peripheral right lower lobe (series 2, image 88). No new nodules. No pleural effusion or pneumothorax. Upper Abdomen: No acute abnormality. Musculoskeletal: Widespread, heterogeneously mottled, lytic appearance of the included osseous structures, with multiple new vertebral body wedging endplate deformities throughout the included lower cervical, thoracic, and upper lumbar spine, new wedge or endplate deformities involving the C7, T3, T7, T10 through T12, and L1 vertebral bodies. Interval worsening of a previously seen wedge deformity of T9. IMPRESSION: 1. Multiple small pulmonary nodules are unchanged, largest again a 1.0 cm nodule of the lateral segment right middle lobe.  These are almost certainly incidental and benign sequelae of prior infection or inflammation, especially given that pulmonary involvement is very unusual in multiple myeloma. Continued attention on follow-up if indicated by oncologic imaging protocol. 2. Widespread, heterogeneously mottled, lytic appearance of the included osseous structures, in keeping with multiple myeloma. 3. Multiple vertebral body wedge and endplate deformities throughout the included lower cervical, thoracic, and upper lumbar spine, new wedge or endplate deformities involving the C7, T3, T7, T10 through T12, and L1 vertebral bodies. Interval worsening of a previously seen wedge deformity of T9. 4. Coronary artery disease. Aortic Atherosclerosis (ICD10-I70.0). Electronically Signed   By: Delanna Ahmadi M.D.   On: 02/18/2021 10:24   CT BONE MARROW BIOPSY & ASPIRATION  Result Date: 04/21/2021 INDICATION: multiple mylenoma EXAM: CT BONE MARROW BIOPSY AND ASPIRATION MEDICATIONS: None. ANESTHESIA/SEDATION: Moderate (conscious) sedation was employed during this procedure. A total of Versed 2 mg and Fentanyl 100 mcg was administered intravenously. Moderate Sedation Time: 13 minutes. The patient's level of consciousness and vital signs were monitored continuously by radiology nursing throughout the procedure under my direct supervision. FLUOROSCOPY TIME:  N/a COMPLICATIONS: None immediate. PROCEDURE: Informed written consent was obtained from the patient after a thorough discussion of the procedural risks, benefits and alternatives. All questions were addressed. Maximal Sterile Barrier Technique was utilized including caps, mask, sterile gowns, sterile gloves, sterile drape, hand hygiene and skin antiseptic. A timeout was performed prior to the initiation of the procedure. The patient was placed prone on the CT exam table. Limited CT of the pelvis was performed for planning purposes. Skin entry site  was marked, and the overlying skin was prepped and  draped in the standard sterile fashion. Local analgesia was obtained with 1% lidocaine. Using CT guidance, an 11 gauge needle was advanced just deep to the cortex of the right posterior ilium. Subsequently, bone marrow aspiration and core biopsy were performed. A second core biopsy was performed at the request of the pathology department. Specimens were submitted to lab/pathology for handling. Hemostasis was achieved with manual pressure, and a clean dressing was placed. The patient tolerated the procedure well without immediate complication. IMPRESSION: Successful CT-guided bone marrow aspiration and core biopsy of the right posterior ilium. Electronically Signed   By: Albin Felling M.D.   On: 04/21/2021 14:39       ASSESSMENT & PLAN:  1. Multiple myeloma not having achieved remission ([place])   2. Encounter for antineoplastic chemotherapy   3. Lytic bone lesions on xray   4. Hypokalemia    Cancer Staging  Multiple myeloma not having achieved remission ([place]) Staging form: Plasma Cell Myeloma and Plasma Cell Disorders, AJCC 8th Edition - Clinical stage from 02/04/2020: Beta-2-microglobulin (mg/L): 2.1, Albumin (g/dL): 2.7, ISS: Stage II, High-risk cytogenetics: Absent, LDH: Unknown - Signed by Earlie Server, MD on 05/06/2020   #IgG multiple myeloma, stage II, del 1 p, high risk.  Declined bone marrow transplant evaluation. 1st line treatment with RVD, BM biopsy after 8 cycles showed 1% plasma cells-He prefers to hold off maintenance treatment -M protein trended up and he resumed RVD 11/03/2020.-Bone marrow after 14 cycles of RVD showed plasma cells 2%. Labs are reviewed and discussed with patient. Proceed with cycle 15-day 1 Velcade and dexamethasone.  Patient starts Revlimid 25 mg 2 weeks on 1 week off today. Continue aspirin 81 mg daily for DVT prophylaxis. Continue acyclovir for shingles prophylaxis Free light chain ratio 1.34.   M spike has decreased to 0.3, plateaued. We will continue chemotherapy  and to he starts bone marrow transplant at [facility].  #Bone lesion,  Zometa 03/25/2021 -we will schedule at the next visit. Continue calcium 1200 mg daily.  # hypokalemia, potassium stable at 3.5, continue potassium chloride 20 mEq daily.  #Lung nodules, repeat CT showed no changes. Likely benign, monitor.  # Grade 1 neuropathy, monitor.   We spent sufficient time to discuss many aspect of care, questions were answered to patient's satisfaction. All questions were answered. The patient knows to call the clinic with any problems questions or concerns.  cc Sofie Hartigan, MD   Return of visit:   Weekly Velcade and Dex x 1 Lab MD Velcade and Dex in 2 weeks.   Earlie Server, MD, PhD  05/06/2021

## 2021-05-07 ENCOUNTER — Telehealth: Payer: Self-pay

## 2021-05-07 LAB — KAPPA/LAMBDA LIGHT CHAINS
Kappa free light chain: 10.9 mg/L (ref 3.3–19.4)
Kappa, lambda light chain ratio: 1.36 (ref 0.26–1.65)
Lambda free light chains: 8 mg/L (ref 5.7–26.3)

## 2021-05-07 NOTE — Telephone Encounter (Signed)
-----   Message from Earlie Server, MD sent at 05/06/2021  6:42 PM EST ----- Please add to Zometa at his next treatment appointment.

## 2021-05-07 NOTE — Telephone Encounter (Signed)
[place], please add Zometa to patient's next appt. Thanks

## 2021-05-11 ENCOUNTER — Encounter: Payer: Self-pay | Admitting: Oncology

## 2021-05-11 LAB — MULTIPLE MYELOMA PANEL, SERUM
Albumin SerPl Elph-Mcnc: 3.4 g/dL (ref 2.9–4.4)
Albumin/Glob SerPl: 1.8 — ABNORMAL HIGH (ref 0.7–1.7)
Alpha 1: 0.2 g/dL (ref 0.0–0.4)
Alpha2 Glob SerPl Elph-Mcnc: 0.7 g/dL (ref 0.4–1.0)
B-Globulin SerPl Elph-Mcnc: 0.8 g/dL (ref 0.7–1.3)
Gamma Glob SerPl Elph-Mcnc: 0.3 g/dL — ABNORMAL LOW (ref 0.4–1.8)
Globulin, Total: 2 g/dL — ABNORMAL LOW (ref 2.2–3.9)
IgA: 97 mg/dL (ref 61–437)
IgG (Immunoglobin G), Serum: 507 mg/dL — ABNORMAL LOW (ref 603–1613)
IgM (Immunoglobulin M), Srm: 21 mg/dL (ref 20–172)
M Protein SerPl Elph-Mcnc: 0.2 g/dL — ABNORMAL HIGH
Total Protein ELP: 5.4 g/dL — ABNORMAL LOW (ref 6.0–8.5)

## 2021-05-13 ENCOUNTER — Other Ambulatory Visit: Payer: Medicare Other

## 2021-05-13 ENCOUNTER — Ambulatory Visit: Payer: Medicare Other

## 2021-05-13 ENCOUNTER — Other Ambulatory Visit: Payer: Self-pay

## 2021-05-13 ENCOUNTER — Inpatient Hospital Stay: Payer: Medicare Other | Attending: Oncology

## 2021-05-13 ENCOUNTER — Ambulatory Visit: Payer: Medicare Other | Admitting: Oncology

## 2021-05-13 ENCOUNTER — Inpatient Hospital Stay: Payer: Medicare Other

## 2021-05-13 VITALS — BP 111/72 | HR 72 | Temp 96.2°F | Resp 16 | Wt 229.9 lb

## 2021-05-13 DIAGNOSIS — Z5111 Encounter for antineoplastic chemotherapy: Secondary | ICD-10-CM | POA: Insufficient documentation

## 2021-05-13 DIAGNOSIS — E785 Hyperlipidemia, unspecified: Secondary | ICD-10-CM | POA: Diagnosis not present

## 2021-05-13 DIAGNOSIS — C9000 Multiple myeloma not having achieved remission: Secondary | ICD-10-CM

## 2021-05-13 DIAGNOSIS — I700 Atherosclerosis of aorta: Secondary | ICD-10-CM | POA: Diagnosis not present

## 2021-05-13 DIAGNOSIS — Z7982 Long term (current) use of aspirin: Secondary | ICD-10-CM | POA: Insufficient documentation

## 2021-05-13 DIAGNOSIS — F17210 Nicotine dependence, cigarettes, uncomplicated: Secondary | ICD-10-CM | POA: Diagnosis not present

## 2021-05-13 DIAGNOSIS — Z8616 Personal history of COVID-19: Secondary | ICD-10-CM | POA: Insufficient documentation

## 2021-05-13 DIAGNOSIS — E876 Hypokalemia: Secondary | ICD-10-CM | POA: Diagnosis not present

## 2021-05-13 DIAGNOSIS — Z79899 Other long term (current) drug therapy: Secondary | ICD-10-CM | POA: Insufficient documentation

## 2021-05-13 DIAGNOSIS — M545 Low back pain, unspecified: Secondary | ICD-10-CM | POA: Insufficient documentation

## 2021-05-13 DIAGNOSIS — I10 Essential (primary) hypertension: Secondary | ICD-10-CM | POA: Insufficient documentation

## 2021-05-13 DIAGNOSIS — Z7961 Long term (current) use of immunomodulator: Secondary | ICD-10-CM | POA: Insufficient documentation

## 2021-05-13 DIAGNOSIS — R918 Other nonspecific abnormal finding of lung field: Secondary | ICD-10-CM | POA: Diagnosis not present

## 2021-05-13 DIAGNOSIS — M899 Disorder of bone, unspecified: Secondary | ICD-10-CM

## 2021-05-13 LAB — CBC WITH DIFFERENTIAL/PLATELET
Abs Immature Granulocytes: 0.02 10*3/uL (ref 0.00–0.07)
Basophils Absolute: 0 10*3/uL (ref 0.0–0.1)
Basophils Relative: 0 %
Eosinophils Absolute: 0.4 10*3/uL (ref 0.0–0.5)
Eosinophils Relative: 9 %
HCT: 41.6 % (ref 39.0–52.0)
Hemoglobin: 13.3 g/dL (ref 13.0–17.0)
Immature Granulocytes: 1 %
Lymphocytes Relative: 21 %
Lymphs Abs: 0.9 10*3/uL (ref 0.7–4.0)
MCH: 28.2 pg (ref 26.0–34.0)
MCHC: 32 g/dL (ref 30.0–36.0)
MCV: 88.3 fL (ref 80.0–100.0)
Monocytes Absolute: 0.4 10*3/uL (ref 0.1–1.0)
Monocytes Relative: 10 %
Neutro Abs: 2.5 10*3/uL (ref 1.7–7.7)
Neutrophils Relative %: 59 %
Platelets: 107 10*3/uL — ABNORMAL LOW (ref 150–400)
RBC: 4.71 MIL/uL (ref 4.22–5.81)
RDW: 16.1 % — ABNORMAL HIGH (ref 11.5–15.5)
WBC: 4.2 10*3/uL (ref 4.0–10.5)
nRBC: 0 % (ref 0.0–0.2)

## 2021-05-13 LAB — COMPREHENSIVE METABOLIC PANEL
ALT: 25 U/L (ref 0–44)
AST: 16 U/L (ref 15–41)
Albumin: 3.7 g/dL (ref 3.5–5.0)
Alkaline Phosphatase: 40 U/L (ref 38–126)
Anion gap: 5 (ref 5–15)
BUN: 10 mg/dL (ref 8–23)
CO2: 27 mmol/L (ref 22–32)
Calcium: 8.6 mg/dL — ABNORMAL LOW (ref 8.9–10.3)
Chloride: 103 mmol/L (ref 98–111)
Creatinine, Ser: 0.85 mg/dL (ref 0.61–1.24)
GFR, Estimated: >60 mL/min (ref >60)
Glucose, Bld: 67 mg/dL — ABNORMAL LOW (ref 70–99)
Potassium: 3.3 mmol/L — ABNORMAL LOW (ref 3.5–5.1)
Sodium: 135 mmol/L (ref 135–145)
Total Bilirubin: 1.1 mg/dL (ref 0.3–1.2)
Total Protein: 5.9 g/dL — ABNORMAL LOW (ref 6.5–8.1)

## 2021-05-13 MED ORDER — SODIUM CHLORIDE 0.9 % IV SOLN
INTRAVENOUS | Status: DC
  Filled 2021-05-13: qty 250

## 2021-05-13 MED ORDER — DEXAMETHASONE 4 MG PO TABS
20.0000 mg | ORAL_TABLET | Freq: Once | ORAL | Status: AC
  Administered 2021-05-13: 20 mg via ORAL
  Filled 2021-05-13: qty 5

## 2021-05-13 MED ORDER — BORTEZOMIB CHEMO SQ INJECTION 3.5 MG (2.5MG/ML)
1.3000 mg/m2 | Freq: Once | INTRAMUSCULAR | Status: AC
  Administered 2021-05-13: 3 mg via SUBCUTANEOUS
  Filled 2021-05-13: qty 1.2

## 2021-05-13 MED ORDER — ZOLEDRONIC ACID 4 MG/100ML IV SOLN: 4.0000 mg | Freq: Once | INTRAVENOUS | Status: DC

## 2021-05-20 ENCOUNTER — Other Ambulatory Visit: Payer: Self-pay | Admitting: *Deleted

## 2021-05-20 ENCOUNTER — Inpatient Hospital Stay: Payer: Medicare Other

## 2021-05-20 ENCOUNTER — Other Ambulatory Visit: Payer: Medicare Other

## 2021-05-20 ENCOUNTER — Ambulatory Visit: Payer: Medicare Other

## 2021-05-20 ENCOUNTER — Inpatient Hospital Stay (HOSPITAL_BASED_OUTPATIENT_CLINIC_OR_DEPARTMENT_OTHER): Payer: Medicare Other | Admitting: Oncology

## 2021-05-20 ENCOUNTER — Encounter: Payer: Self-pay | Admitting: Oncology

## 2021-05-20 ENCOUNTER — Ambulatory Visit: Payer: Medicare Other | Admitting: Oncology

## 2021-05-20 ENCOUNTER — Other Ambulatory Visit: Payer: Self-pay

## 2021-05-20 VITALS — BP 114/76 | HR 79 | Temp 97.4°F | Resp 18 | Wt 230.1 lb

## 2021-05-20 DIAGNOSIS — E876 Hypokalemia: Secondary | ICD-10-CM

## 2021-05-20 DIAGNOSIS — C9000 Multiple myeloma not having achieved remission: Secondary | ICD-10-CM

## 2021-05-20 LAB — CBC WITH DIFFERENTIAL/PLATELET
Abs Immature Granulocytes: 0.02 10*3/uL (ref 0.00–0.07)
Basophils Absolute: 0 10*3/uL (ref 0.0–0.1)
Basophils Relative: 0 %
Eosinophils Absolute: 0.6 10*3/uL — ABNORMAL HIGH (ref 0.0–0.5)
Eosinophils Relative: 11 %
HCT: 40.4 % (ref 39.0–52.0)
Hemoglobin: 12.9 g/dL — ABNORMAL LOW (ref 13.0–17.0)
Immature Granulocytes: 0 %
Lymphocytes Relative: 15 %
Lymphs Abs: 0.9 10*3/uL (ref 0.7–4.0)
MCH: 28.2 pg (ref 26.0–34.0)
MCHC: 31.9 g/dL (ref 30.0–36.0)
MCV: 88.2 fL (ref 80.0–100.0)
Monocytes Absolute: 1 10*3/uL (ref 0.1–1.0)
Monocytes Relative: 18 %
Neutro Abs: 3.2 10*3/uL (ref 1.7–7.7)
Neutrophils Relative %: 56 %
Platelets: 136 10*3/uL — ABNORMAL LOW (ref 150–400)
RBC: 4.58 MIL/uL (ref 4.22–5.81)
RDW: 16.3 % — ABNORMAL HIGH (ref 11.5–15.5)
WBC: 5.7 10*3/uL (ref 4.0–10.5)
nRBC: 0 % (ref 0.0–0.2)

## 2021-05-20 LAB — COMPREHENSIVE METABOLIC PANEL
ALT: 23 U/L (ref 0–44)
AST: 19 U/L (ref 15–41)
Albumin: 3.5 g/dL (ref 3.5–5.0)
Alkaline Phosphatase: 40 U/L (ref 38–126)
Anion gap: 8 (ref 5–15)
BUN: 13 mg/dL (ref 8–23)
CO2: 27 mmol/L (ref 22–32)
Calcium: 8.7 mg/dL — ABNORMAL LOW (ref 8.9–10.3)
Chloride: 103 mmol/L (ref 98–111)
Creatinine, Ser: 0.88 mg/dL (ref 0.61–1.24)
GFR, Estimated: >60 mL/min (ref >60)
Glucose, Bld: 94 mg/dL (ref 70–99)
Potassium: 3.2 mmol/L — ABNORMAL LOW (ref 3.5–5.1)
Sodium: 138 mmol/L (ref 135–145)
Total Bilirubin: 0.8 mg/dL (ref 0.3–1.2)
Total Protein: 5.9 g/dL — ABNORMAL LOW (ref 6.5–8.1)

## 2021-05-20 NOTE — Progress Notes (Signed)
Hematology/Oncology follow up note [phone] [phone]   Patient Care Team: Sofie Hartigan, MD as PCP - General (Family Medicine) Noreene Filbert, MD as Radiation Oncologist (Radiation Oncology)  REFERRING PROVIDER: Sofie Hartigan, MD  CHIEF COMPLAINTS/REASON FOR VISIT:  Follow-up for multiple myeloma HISTORY OF PRESENTING ILLNESS:   Tommy Smith. is a  66 y.o.  male with PMH listed below was seen in consultation at the request of  Feldpausch, Chrissie Noa, MD  for evaluation of abnormal serum protein electrophoresis.  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  Increase calcium level at 12.3, PTH was normal.  Creatinine 1, estimated GFR 91, CBC showed hemoglobin 12.4, Patient also recently had a CT chest abdomen pelvis with contrast for evaluation of right flank pain and right lower lobe nodule. 01/29/2020, CT showed multiple bilateral pulmonary nodules measuring up to 10 mm in the right middle lobe, nodules are indeterminate, infectious/inflammatory etiology versus metastatic disease. Multiple osseous lytic lesions, largest lesion involving the left pubic bone concerning for metastatic disease. Compression fractures at T9, age indeterminate. Appearance focal area of thickening at the gastroesophageal junction.  Further evaluation with upper GI study or direct visualization with endoscopy is recommended No bowel obstruction. Aortic atherosclerosis.  Patient reports NSAIDs as needed for lower back pain.  Mid lower back pain usually is worse when when he stands up from sitting position.  Patient works in the [employer].  Lives at home with wife.  Denies any unintentional weight loss, fever, chills, night sweating  # IgG multiple myeloma, stage II, del 1 p, high risk Baseline M protein 4.1, kappa light chain level 333.8 Beta-2 microglobulin 2.1, albumin 2.7. 02/12/2020, bone marrow biopsy showed hypercellular for age,  80% plasma cell which are complex restricted by light chain in situ heparinization.  Background hematopoiesis is present but reduced. Cytogenetics is normal, Myeloma FISH panel-deletion 1P,Standard risk. He is not interested in  # 02/27/2020 patient has been started on Revlimid 25 mg D1-14 #02/24/2020- 08/26/2020  Velcade weekly with dexamethasone 20 mg weekly  # 08/14/2020 M protein 0.5, close to 90% reduction from original M protein of 4.1, light chain ration 1.76 # 08/18/2020  Bone marrow results were reviewed.  1% plasma cell.  Normal cytogenetics.  Myeloma FISH negative for 1p del. #09/01/2020, patient agreed for second opinion at [facility] for evaluation and discussion of possible bone marrow transplant. He prefers to hold off maintenance treatment until his [place] appointment.   #Focal area of thickening at the GE junction. 10/23/2020 EGD is normal.   #Lung nodules, measures up to 10 mm on the right middle lobe.  Indeterminate.  Attention on follow-up.  Recommend repeat CT chest and he declined  11/03/2020, M protein 0.6, lambda free light chain 4.9, kappa light chain 14.8, kappa lambda light chain ratio 3.02. Discussed about proceed with 1 cycle of Revlimid 25 mg 2 weeks on 1 week off while waiting for [facility] second opinion.  He agrees with the plan.  12/09/2020 Evaluated by Dr.Gasparetto at [facility].  Recommend patient to resume VRD.  There was plan of bone marrow biopsy to be repeated at [facility].   #03/10/2021 COVID 19 positive.  Seen at emergency room, omit D15 dose of Velcade for cycle 12.   INTERVAL HISTORY Tommy Smith. is a 66 y.o. male who has above history reviewed by me today presents for follow up visit for multiple myeloma He will start autologous marrow transplant Day 1 will be 06/12/21.  He will stop  all myeloma treatment for now.    Review of Systems  Constitutional:  Negative for appetite change, chills, fatigue, fever and unexpected weight change.  HENT:   Negative for hearing loss  and voice change.   Eyes:  Negative for eye problems and icterus.  Respiratory:  Negative for chest tightness, cough and shortness of breath.   Cardiovascular:  Negative for chest pain and leg swelling.  Gastrointestinal:  Negative for abdominal distention, abdominal pain and constipation.  Endocrine: Negative for hot flashes.  Genitourinary:  Negative for difficulty urinating, dysuria and frequency.   Musculoskeletal:  Negative for arthralgias, back pain and neck pain.  Skin:  Negative for itching and rash.  Neurological:  Positive for numbness. Negative for light-headedness.  Hematological:  Negative for adenopathy. Does not bruise/bleed easily.  Psychiatric/Behavioral:  Negative for confusion.     MEDICAL HISTORY:  Past Medical History:  Diagnosis Date   Cancer ([place])    Depression    Hypercalcemia 03/02/2020   Hyperlipemia    Hypertension    Hypogonadism in male    Multiple myeloma [facility])     SURGICAL HISTORY: Past Surgical History:  Procedure Laterality Date   BONE MARROW BIOPSY     COLONOSCOPY  07/14/2008   ESOPHAGOGASTRODUODENOSCOPY N/A 10/22/2020   Procedure: ESOPHAGOGASTRODUODENOSCOPY (EGD);  Surgeon: Jonathon Bellows, MD;  Location: [facility] ENDOSCOPY;  Service: Gastroenterology;  Laterality: N/A;    SOCIAL HISTORY: Social History   Socioeconomic History   Marital status: Married    Spouse name: [name]   Number of children: 3   Years of education: Not on file   Highest education level: Not on file  Occupational History   Not on file  Tobacco Use   Smoking status: Every Day    Packs/day: 0.25    Years: 30.00    Pack years: 7.50    Types: Cigarettes   Smokeless tobacco: Never  Substance and Sexual Activity   Alcohol use: Yes    Comment: occcassional   Drug use: No   Sexual activity: Not on file  Other Topics Concern   Not on file  Social History Narrative   Not on file   Social Determinants of Health   Financial Resource Strain: Not on file  Food  Insecurity: Not on file  Transportation Needs: Not on file  Physical Activity: Not on file  Stress: Not on file  Social Connections: Not on file  Intimate Partner Violence: Not on file    FAMILY HISTORY: Family History  Problem Relation Age of Onset   Skin cancer Mother    Prostate cancer Father     ALLERGIES:  has No Known Allergies.  MEDICATIONS:  Current Outpatient Medications  Medication Sig Dispense Refill   acyclovir (ZOVIRAX) 400 MG tablet TAKE 1 TABLET BY MOUTH TWICE A DAY 180 tablet 1   amLODipine (NORVASC) 10 MG tablet Take by mouth daily.     ASPIRIN 81 PO Take 81 mg by mouth daily.     atorvastatin (LIPITOR) 40 MG tablet Take 40 mg by mouth daily.     buPROPion (WELLBUTRIN XL) 300 MG 24 hr tablet Take 300 mg by mouth daily.     KLOR-CON M20 20 MEQ tablet TAKE 1 TABLET BY MOUTH TWICE A DAY 180 tablet 1   lenalidomide (REVLIMID) 25 MG capsule Take 1 capsule (25 mg total) by mouth daily. Take 14 days on, 7 days off, repeat every 21 days. 14 capsule 0   losartan-hydrochlorothiazide (HYZAAR) 100-12.5 MG tablet Take 1 tablet  by mouth daily.     omeprazole (PRILOSEC) 20 MG capsule Take 1 capsule (20 mg total) by mouth daily. 30 capsule 1   polyethylene glycol (MIRALAX) 17 g packet Take 17 g by mouth daily. 14 each 0   Psyllium (METAMUCIL MULTIHEALTH FIBER PO) Take by mouth daily.     senna-docusate (SENNA S) 8.6-50 MG tablet Take 2 tablets by mouth daily. 60 tablet 3   No current facility-administered medications for this visit.     PHYSICAL EXAMINATION: ECOG PERFORMANCE STATUS: 1 - Symptomatic but completely ambulatory Vitals:   05/20/21 0838  BP: 114/76  Pulse: 79  Resp: 18  Temp: (!) 97.4 F (36.3 C)   Filed Weights   05/20/21 0838  Weight: 230 lb 1.6 oz (104.4 kg)    Physical Exam Constitutional:      General: He is not in acute distress.    Appearance: He is not diaphoretic.  HENT:     Head: Normocephalic and atraumatic.     Nose: Nose normal.      Mouth/Throat:     Pharynx: No oropharyngeal exudate.  Eyes:     General: No scleral icterus.    Pupils: Pupils are equal, round, and reactive to light.  Cardiovascular:     Rate and Rhythm: Normal rate and regular rhythm.     Heart sounds: No murmur heard. Pulmonary:     Effort: Pulmonary effort is normal. No respiratory distress.     Breath sounds: No rales.  Chest:     Chest wall: No tenderness.  Abdominal:     General: There is no distension.     Palpations: Abdomen is soft.     Tenderness: There is no abdominal tenderness.  Musculoskeletal:        General: Normal range of motion.     Cervical back: Normal range of motion and neck supple.  Skin:    General: Skin is warm and dry.     Findings: No erythema.  Neurological:     Mental Status: He is alert and oriented to person, place, and time.     Cranial Nerves: No cranial nerve deficit.     Motor: No abnormal muscle tone.     Coordination: Coordination normal.  Psychiatric:        Mood and Affect: Affect normal.      LABORATORY DATA:  I have reviewed the data as listed Lab Results  Component Value Date   WBC 5.7 05/20/2021   HGB 12.9 (L) 05/20/2021   HCT 40.4 05/20/2021   MCV 88.2 05/20/2021   PLT 136 (L) 05/20/2021   Recent Labs    01/26/21 1345 02/02/21 1419 05/06/21 0827 05/13/21 0905 05/20/21 0828  NA 139   < > 140 135 138  K 3.7   < > 3.5 3.3* 3.2*  CL 103   < > 106 103 103  CO2 31   < > 26 27 27   GLUCOSE 88   < > 78 67* 94  BUN 13   < > 11 10 13   CREATININE 0.83   < > 0.89 0.85 0.88  CALCIUM 8.7*   < > 8.7* 8.6* 8.7*  GFRNONAA >60   < > >60 >60 >60  PROT 6.2*   < > 5.9* 5.9* 5.9*  ALBUMIN 3.4*   < > 3.7 3.7 3.5  AST 11*   < > 22 16 19   ALT 19   < > 41 25 23  ALKPHOS 51   < > 38  40 40  BILITOT 1.4*   < > 1.3* 1.1 0.8  BILIDIR 0.3*  --   --   --   --    < > = values in this interval not displayed.    Iron/TIBC/Ferritin/ %Sat No results found for: IRON, TIBC, FERRITIN, IRONPCTSAT     RADIOGRAPHIC STUDIES: I have personally reviewed the radiological images as listed and agreed with the findings in the report. CT BONE MARROW BIOPSY & ASPIRATION  Result Date: 04/21/2021 INDICATION: multiple mylenoma EXAM: CT BONE MARROW BIOPSY AND ASPIRATION MEDICATIONS: None. ANESTHESIA/SEDATION: Moderate (conscious) sedation was employed during this procedure. A total of Versed 2 mg and Fentanyl 100 mcg was administered intravenously. Moderate Sedation Time: 13 minutes. The patient's level of consciousness and vital signs were monitored continuously by radiology nursing throughout the procedure under my direct supervision. FLUOROSCOPY TIME:  N/a COMPLICATIONS: None immediate. PROCEDURE: Informed written consent was obtained from the patient after a thorough discussion of the procedural risks, benefits and alternatives. All questions were addressed. Maximal Sterile Barrier Technique was utilized including caps, mask, sterile gowns, sterile gloves, sterile drape, hand hygiene and skin antiseptic. A timeout was performed prior to the initiation of the procedure. The patient was placed prone on the CT exam table. Limited CT of the pelvis was performed for planning purposes. Skin entry site was marked, and the overlying skin was prepped and draped in the standard sterile fashion. Local analgesia was obtained with 1% lidocaine. Using CT guidance, an 11 gauge needle was advanced just deep to the cortex of the right posterior ilium. Subsequently, bone marrow aspiration and core biopsy were performed. A second core biopsy was performed at the request of the pathology department. Specimens were submitted to lab/pathology for handling. Hemostasis was achieved with manual pressure, and a clean dressing was placed. The patient tolerated the procedure well without immediate complication. IMPRESSION: Successful CT-guided bone marrow aspiration and core biopsy of the right posterior ilium. Electronically Signed   By:  Albin Felling M.D.   On: 04/21/2021 14:39       ASSESSMENT & PLAN:  1. Multiple myeloma not having achieved remission ([place])   2. Hypokalemia    Cancer Staging  Multiple myeloma not having achieved remission ([place]) Staging form: Plasma Cell Myeloma and Plasma Cell Disorders, AJCC 8th Edition - Clinical stage from 02/04/2020: Beta-2-microglobulin (mg/L): 2.1, Albumin (g/dL): 2.7, ISS: Stage II, High-risk cytogenetics: Absent, LDH: Unknown - Signed by Earlie Server, MD on 05/06/2020   #IgG multiple myeloma, stage II, del 1 p, high risk.  Declined bone marrow transplant evaluation. 1st line treatment with RVD, BM biopsy after 8 cycles showed 1% plasma cells-He prefers to hold off maintenance treatment -M protein trended up and he resumed RVD 11/03/2020.-Bone marrow after 14 cycles of RVD showed plasma cells 2%. Labs are reviewed and discussed with patient. Hold additional chemotherapy at this point as he will start BM transplant.  He is welcome to return to our clinic for his post transplant maintenance. I ask patient to contact us once he is ready to return. Patient appreciates the care he received at our cancer centre.   Hypokalemia, continue potassium supplementation. We spent sufficient time to discuss many aspect of care, questions were answered to patient's satisfaction. All questions were answered. The patient knows to call the clinic with any problems questions or concerns.  cc Sofie Hartigan, MD   Return of visit:   TBD  Earlie Server, MD, PhD  05/20/2021

## 2021-05-20 NOTE — Progress Notes (Signed)
Patient here for follow up. No new concerns voiced.  °

## 2021-05-20 NOTE — Telephone Encounter (Signed)
Medication currently on Hold. Pharmacy informed.

## 2021-07-19 ENCOUNTER — Encounter: Payer: Self-pay | Admitting: Oncology

## 2021-07-19 ENCOUNTER — Inpatient Hospital Stay: Payer: Medicare Other | Attending: Oncology | Admitting: Oncology

## 2021-07-19 ENCOUNTER — Inpatient Hospital Stay: Payer: Medicare Other

## 2021-07-19 VITALS — BP 117/77 | HR 90 | Temp 98.7°F | Wt 213.3 lb

## 2021-07-19 DIAGNOSIS — E785 Hyperlipidemia, unspecified: Secondary | ICD-10-CM | POA: Diagnosis not present

## 2021-07-19 DIAGNOSIS — C9000 Multiple myeloma not having achieved remission: Secondary | ICD-10-CM | POA: Diagnosis present

## 2021-07-19 DIAGNOSIS — F17210 Nicotine dependence, cigarettes, uncomplicated: Secondary | ICD-10-CM | POA: Diagnosis not present

## 2021-07-19 DIAGNOSIS — D696 Thrombocytopenia, unspecified: Secondary | ICD-10-CM | POA: Diagnosis not present

## 2021-07-19 DIAGNOSIS — Z9481 Bone marrow transplant status: Secondary | ICD-10-CM

## 2021-07-19 DIAGNOSIS — E709 Disorder of aromatic amino-acid metabolism, unspecified: Secondary | ICD-10-CM | POA: Diagnosis not present

## 2021-07-19 DIAGNOSIS — M5459 Other low back pain: Secondary | ICD-10-CM | POA: Insufficient documentation

## 2021-07-19 DIAGNOSIS — Z79899 Other long term (current) drug therapy: Secondary | ICD-10-CM | POA: Diagnosis not present

## 2021-07-19 DIAGNOSIS — D649 Anemia, unspecified: Secondary | ICD-10-CM

## 2021-07-19 DIAGNOSIS — D708 Other neutropenia: Secondary | ICD-10-CM

## 2021-07-19 DIAGNOSIS — I10 Essential (primary) hypertension: Secondary | ICD-10-CM | POA: Diagnosis not present

## 2021-07-19 DIAGNOSIS — E291 Testicular hypofunction: Secondary | ICD-10-CM | POA: Diagnosis not present

## 2021-07-19 LAB — CBC WITH DIFFERENTIAL/PLATELET
Abs Immature Granulocytes: 0.02 10*3/uL (ref 0.00–0.07)
Basophils Absolute: 0.1 10*3/uL (ref 0.0–0.1)
Basophils Relative: 2 %
Eosinophils Absolute: 0.2 10*3/uL (ref 0.0–0.5)
Eosinophils Relative: 7 %
HCT: 37.3 % — ABNORMAL LOW (ref 39.0–52.0)
Hemoglobin: 12.1 g/dL — ABNORMAL LOW (ref 13.0–17.0)
Immature Granulocytes: 1 %
Lymphocytes Relative: 52 %
Lymphs Abs: 1.5 10*3/uL (ref 0.7–4.0)
MCH: 28.5 pg (ref 26.0–34.0)
MCHC: 32.4 g/dL (ref 30.0–36.0)
MCV: 87.8 fL (ref 80.0–100.0)
Monocytes Absolute: 0.4 10*3/uL (ref 0.1–1.0)
Monocytes Relative: 15 %
Neutro Abs: 0.6 10*3/uL — ABNORMAL LOW (ref 1.7–7.7)
Neutrophils Relative %: 23 %
Platelets: 139 10*3/uL — ABNORMAL LOW (ref 150–400)
RBC: 4.25 MIL/uL (ref 4.22–5.81)
RDW: 17.6 % — ABNORMAL HIGH (ref 11.5–15.5)
Smear Review: DECREASED
WBC: 2.7 10*3/uL — ABNORMAL LOW (ref 4.0–10.5)
nRBC: 0 % (ref 0.0–0.2)

## 2021-07-19 LAB — COMPREHENSIVE METABOLIC PANEL
ALT: 15 U/L (ref 0–44)
AST: 17 U/L (ref 15–41)
Albumin: 3.1 g/dL — ABNORMAL LOW (ref 3.5–5.0)
Alkaline Phosphatase: 56 U/L (ref 38–126)
Anion gap: 5 (ref 5–15)
BUN: 10 mg/dL (ref 8–23)
CO2: 25 mmol/L (ref 22–32)
Calcium: 8 mg/dL — ABNORMAL LOW (ref 8.9–10.3)
Chloride: 106 mmol/L (ref 98–111)
Creatinine, Ser: 0.94 mg/dL (ref 0.61–1.24)
GFR, Estimated: >60 mL/min (ref >60)
Glucose, Bld: 94 mg/dL (ref 70–99)
Potassium: 3.4 mmol/L — ABNORMAL LOW (ref 3.5–5.1)
Sodium: 136 mmol/L (ref 135–145)
Total Bilirubin: 0.5 mg/dL (ref 0.3–1.2)
Total Protein: 5.8 g/dL — ABNORMAL LOW (ref 6.5–8.1)

## 2021-07-19 LAB — MAGNESIUM: Magnesium: 2.1 mg/dL (ref 1.7–2.4)

## 2021-07-19 NOTE — Progress Notes (Addendum)
?Hematology/Oncology Progress note ?[phone] [phone] ?  ? ? ? ?Patient Care Team: ?Tommy Hartigan, Smith as PCP - General (Family Medicine) ?Tommy Filbert, Smith as Radiation Oncologist (Radiation Oncology) ? ?REFERRING PROVIDER: ?Tommy Hartigan, Smith  ?CHIEF COMPLAINTS/REASON FOR VISIT:  ?Follow-up for multiple myeloma ?HISTORY OF PRESENTING ILLNESS:  ? ?Tommy Colden. is a  66 y.o.  male with PMH listed below was seen in consultation at the request of  Tommy Smith  for evaluation of abnormal serum protein electrophoresis. ? ?01/20/2020, serum protein electrophoresis showed increased monoclonal component.  ?Increase calcium level at 12.3, PTH was normal.  Creatinine 1, estimated GFR 91, CBC showed hemoglobin 12.4, ?Patient also recently had a CT chest abdomen pelvis with contrast for evaluation of right flank pain and right lower lobe nodule. ?01/29/2020, CT showed multiple bilateral pulmonary nodules measuring up to 10 mm in the right middle lobe, nodules are indeterminate, infectious/inflammatory etiology versus metastatic disease. ?Multiple osseous lytic lesions, largest lesion involving the left pubic bone concerning for metastatic disease. ?Compression fractures at T9, age indeterminate. ?Appearance focal area of thickening at the gastroesophageal junction.  Further evaluation with upper GI study or direct visualization with endoscopy is recommended ?No bowel obstruction. ?Aortic atherosclerosis. ? ?Patient reports NSAIDs as needed for lower back pain.  Mid lower back pain usually is worse when when he stands up from sitting position.  Patient works in the [employer].  Lives at home with wife.  Denies any unintentional weight loss, fever, chills, night sweating ? ?# IgG multiple myeloma, stage II, del 1 p, high risk ?Baseline M protein 4.1, kappa light chain level 333.8 ?Beta-2 microglobulin 2.1, albumin 2.7. ?02/12/2020, bone marrow biopsy showed hypercellular for  age, 80% plasma cell which are complex restricted by light chain in situ heparinization.  Background hematopoiesis is present but reduced. ?Cytogenetics is normal, Myeloma FISH panel-deletion 1P,Standard risk. ?He is not interested in  ?# 02/27/2020 patient has been started on Revlimid 25 mg D1-14 ?#02/24/2020- 08/26/2020  Velcade weekly with dexamethasone 20 mg weekly ? ?# 08/14/2020 M protein 0.5, close to 90% reduction from original M protein of 4.1, light chain ration 1.76 ?# 08/18/2020  Bone marrow results were reviewed.  1% plasma cell.  Normal cytogenetics.  Myeloma FISH negative for 1p del. ?#09/01/2020, patient agreed for second opinion at [facility] for evaluation and discussion of possible bone marrow transplant. ?He prefers to hold off maintenance treatment until his [place] appointment.  ? ?#Focal area of thickening at the GE junction. 10/23/2020 EGD is normal.   ?#Lung nodules, measures up to 10 mm on the right middle lobe.  Indeterminate.  Attention on follow-up.  Recommend repeat CT chest and he declined ? ?11/03/2020, M protein 0.6, lambda free light chain 4.9, kappa light chain 14.8, kappa lambda light chain ratio 3.02. ?Discussed about proceed with 1 cycle of Revlimid 25 mg 2 weeks on 1 week off while waiting for [facility] second opinion.  He agrees with the plan. ? ?12/09/2020 Evaluated by Tommy Smith at [facility].  ?Recommend patient to resume VRD.  There was plan of bone marrow biopsy to be [facility] at [facility].  ? ?#03/10/2021 COVID 19 positive.  Seen at emergency room, omit D15 dose of Velcade for cycle 12.  ? ?INTERVAL HISTORY ?Tommy Smith. is a 66 y.o. 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 ?Overall he did very well.  Aspirin, atorvastatin, BP medications, potassium have been held.  Patient takes acyclovir for viral prophylaxis. ?He has lost 10 pounds.  Otherwise he is doing very well.  No new  complaints.  Accompanied by wife today. ?Review of Systems  ?Constitutional:  Positive for unexpected weight change. Negative for appetite change, chills, fatigue and fever.  ?HENT:   Negative for hearing loss and voice change.   ?Eyes:  Negative for eye problems and icterus.  ?Respiratory:  Negative for chest tightness, cough and shortness of breath.   ?Cardiovascular:  Negative for chest pain and leg swelling.  ?Gastrointestinal:  Negative for abdominal distention, abdominal pain and constipation.  ?Endocrine: Negative for hot flashes.  ?Genitourinary:  Negative for difficulty urinating, dysuria and frequency.   ?Musculoskeletal:  Negative for arthralgias, back pain and neck pain.  ?Skin:  Negative for itching and rash.  ?Neurological:  Positive for numbness. Negative for light-headedness.  ?Hematological:  Negative for adenopathy. Does not bruise/bleed easily.  ?Psychiatric/Behavioral:  Negative for confusion.   ? ? ?MEDICAL HISTORY:  ?Past Medical History:  ?Diagnosis Date  ? Cancer [facility])   ? Depression   ? Hypercalcemia 03/02/2020  ? Hyperlipemia   ? Hypertension   ? Hypogonadism in male   ? Multiple myeloma ([place])   ? ? ?SURGICAL HISTORY: ?Past Surgical History:  ?Procedure Laterality Date  ? BONE MARROW BIOPSY    ? COLONOSCOPY  07/14/2008  ? ESOPHAGOGASTRODUODENOSCOPY N/A 10/22/2020  ? Procedure: ESOPHAGOGASTRODUODENOSCOPY (EGD);  Surgeon: Tommy Bellows, Smith;  Location: [facility] ENDOSCOPY;  Service: Gastroenterology;  Laterality: N/A;  ? ? ?SOCIAL HISTORY: ?Social History  ? ?Socioeconomic History  ? Marital status: Married  ?  Spouse name: Tommy Smith  ? Number of children: 3  ? Years of education: Not on file  ? Highest education level: Not on file  ?Occupational History  ? Not on file  ?Tobacco Use  ? Smoking status: Every Day  ?  Packs/day: 0.25  ?  Years: 30.00  ?  Pack years: 7.50  ?  Types: Cigarettes  ? Smokeless tobacco: Never  ?Substance and Sexual Activity  ? Alcohol use: Yes  ?  Comment: occcassional  ? Drug  use: No  ? Sexual activity: Not on file  ?Other Topics Concern  ? Not on file  ?Social History Narrative  ? Not on file  ? ?Social Determinants of Health  ? ?Financial Resource Strain: Not on file  ?Food Insecurity: Not on file  ?Transportation Needs: Not on file  ?Physical Activity: Not on file  ?Stress: Not on file  ?Social Connections: Not on file  ?Intimate Partner Violence: Not on file  ? ? ?FAMILY HISTORY: ?Family History  ?Problem Relation Age of Onset  ? Skin cancer Mother   ? Prostate cancer Father   ? ? ?ALLERGIES:  has No Known Allergies. ? ?MEDICATIONS:  ?Current Outpatient Medications  ?Medication Sig Dispense Refill  ? acyclovir (ZOVIRAX) 400 MG tablet TAKE 1 TABLET BY MOUTH TWICE A DAY 180 tablet 1  ? atorvastatin (LIPITOR) 40 MG tablet Take 40 mg by mouth daily.    ? buPROPion (WELLBUTRIN XL) 300 MG 24 hr tablet Take 300 mg by mouth daily.    ? loperamide (IMODIUM) 2 MG capsule Take by mouth.    ? LORazepam (ATIVAN) 0.5 MG tablet Take by mouth.    ? ondansetron (ZOFRAN) 8 MG tablet Take by mouth.    ? polyethylene glycol (MIRALAX) 17 g packet Take 17 g by mouth daily. 14 each 0  ?  prochlorperazine (COMPAZINE) 5 MG tablet Take by mouth.    ? amLODipine (NORVASC) 10 MG tablet Take by mouth daily.    ? ASPIRIN 81 PO Take 81 mg by mouth daily.    ? KLOR-CON M20 20 MEQ tablet TAKE 1 TABLET BY MOUTH TWICE A DAY 180 tablet 1  ? lenalidomide (REVLIMID) 25 MG capsule Take 1 capsule (25 mg total) by mouth daily. Take 14 days on, 7 days off, repeat every 21 days. 14 capsule 0  ? losartan-hydrochlorothiazide (HYZAAR) 100-12.5 MG tablet Take 1 tablet by mouth daily.    ? omeprazole (PRILOSEC) 20 MG capsule Take 1 capsule (20 mg total) by mouth daily. 30 capsule 1  ? Psyllium (METAMUCIL MULTIHEALTH FIBER PO) Take by mouth daily.    ? senna-docusate (SENNA S) 8.6-50 MG tablet Take 2 tablets by mouth daily. 60 tablet 3  ? ?No current facility-administered medications for this visit.  ? ? ? ?PHYSICAL  EXAMINATION: ?ECOG PERFORMANCE STATUS: 1 - Symptomatic but completely ambulatory ?Vitals:  ? 07/19/21 1300  ?BP: 117/77  ?Pulse: 90  ?Temp: 98.7 ?F (37.1 ?C)  ?SpO2: 95%  ? ?Filed Weights  ? 07/19/21 1300  ?Weight: 213

## 2021-07-19 NOTE — Addendum Note (Signed)
Addended by: Earlie Server on: 07/19/2021 11:19 PM ? ? Modules accepted: Orders ? ?

## 2021-07-20 ENCOUNTER — Other Ambulatory Visit: Payer: Self-pay

## 2021-07-20 ENCOUNTER — Telehealth: Payer: Self-pay

## 2021-07-20 DIAGNOSIS — R918 Other nonspecific abnormal finding of lung field: Secondary | ICD-10-CM

## 2021-07-20 NOTE — Telephone Encounter (Signed)
-----   Message from Earlie Server, MD sent at 07/19/2021 11:17 PM EDT ----- ?Please call patient and let him know that his white blood cell count is low. I recommend him to get Ziextenzo daily x 3 day. Add another lab encounter next week, cbc only.  ?

## 2021-07-20 NOTE — Telephone Encounter (Signed)
Spoke with patient and informed him of lab results. Advised that Dr. Tasia Catchings recommends he gets Ziextenzo x 3 and labs next week. Informed patient that our scheduler would be in contact to set up appt. Patient verbalized understanding.  ? ? ?Anderson Malta, please schedule patient for: ?Ziextenzo x 3 this week.  ?Labs next week (cbc) ?Please inform patient of appt. Thanks  ?

## 2021-07-21 ENCOUNTER — Inpatient Hospital Stay: Payer: Medicare Other

## 2021-07-21 DIAGNOSIS — C9000 Multiple myeloma not having achieved remission: Secondary | ICD-10-CM | POA: Diagnosis not present

## 2021-07-21 MED ORDER — FILGRASTIM-SNDZ 480 MCG/0.8ML IJ SOSY
480.0000 ug | PREFILLED_SYRINGE | Freq: Once | INTRAMUSCULAR | Status: AC
  Administered 2021-07-21: 480 ug via SUBCUTANEOUS
  Filled 2021-07-21: qty 0.8

## 2021-07-22 ENCOUNTER — Inpatient Hospital Stay: Payer: Medicare Other

## 2021-07-22 DIAGNOSIS — C9000 Multiple myeloma not having achieved remission: Secondary | ICD-10-CM | POA: Diagnosis not present

## 2021-07-22 MED ORDER — FILGRASTIM-SNDZ 480 MCG/0.8ML IJ SOSY
480.0000 ug | PREFILLED_SYRINGE | Freq: Once | INTRAMUSCULAR | Status: AC
  Administered 2021-07-22: 480 ug via SUBCUTANEOUS
  Filled 2021-07-22: qty 0.8

## 2021-07-23 ENCOUNTER — Inpatient Hospital Stay: Payer: Medicare Other

## 2021-07-23 DIAGNOSIS — C9000 Multiple myeloma not having achieved remission: Secondary | ICD-10-CM | POA: Diagnosis not present

## 2021-07-23 MED ORDER — FILGRASTIM-SNDZ 480 MCG/0.8ML IJ SOSY
480.0000 ug | PREFILLED_SYRINGE | Freq: Once | INTRAMUSCULAR | Status: AC
  Administered 2021-07-23: 480 ug via SUBCUTANEOUS
  Filled 2021-07-23: qty 0.8

## 2021-07-27 ENCOUNTER — Inpatient Hospital Stay: Payer: Medicare Other

## 2021-07-27 DIAGNOSIS — R918 Other nonspecific abnormal finding of lung field: Secondary | ICD-10-CM

## 2021-07-27 DIAGNOSIS — C9000 Multiple myeloma not having achieved remission: Secondary | ICD-10-CM | POA: Diagnosis not present

## 2021-07-27 LAB — CBC WITH DIFFERENTIAL/PLATELET
Abs Immature Granulocytes: 0.46 10*3/uL — ABNORMAL HIGH (ref 0.00–0.07)
Basophils Absolute: 0 10*3/uL (ref 0.0–0.1)
Basophils Relative: 0 %
Eosinophils Absolute: 1 10*3/uL — ABNORMAL HIGH (ref 0.0–0.5)
Eosinophils Relative: 11 %
HCT: 41 % (ref 39.0–52.0)
Hemoglobin: 13.3 g/dL (ref 13.0–17.0)
Immature Granulocytes: 5 %
Lymphocytes Relative: 30 %
Lymphs Abs: 2.6 10*3/uL (ref 0.7–4.0)
MCH: 28.5 pg (ref 26.0–34.0)
MCHC: 32.4 g/dL (ref 30.0–36.0)
MCV: 87.8 fL (ref 80.0–100.0)
Monocytes Absolute: 1.5 10*3/uL — ABNORMAL HIGH (ref 0.1–1.0)
Monocytes Relative: 17 %
Neutro Abs: 3.2 10*3/uL (ref 1.7–7.7)
Neutrophils Relative %: 37 %
Platelets: 142 10*3/uL — ABNORMAL LOW (ref 150–400)
RBC: 4.67 MIL/uL (ref 4.22–5.81)
RDW: 18.6 % — ABNORMAL HIGH (ref 11.5–15.5)
WBC: 8.8 10*3/uL (ref 4.0–10.5)
nRBC: 0 % (ref 0.0–0.2)

## 2021-08-02 ENCOUNTER — Inpatient Hospital Stay: Payer: Medicare Other

## 2021-08-02 DIAGNOSIS — C9000 Multiple myeloma not having achieved remission: Secondary | ICD-10-CM | POA: Diagnosis not present

## 2021-08-02 DIAGNOSIS — D649 Anemia, unspecified: Secondary | ICD-10-CM

## 2021-08-02 LAB — COMPREHENSIVE METABOLIC PANEL
ALT: 11 U/L (ref 0–44)
AST: 15 U/L (ref 15–41)
Albumin: 3.5 g/dL (ref 3.5–5.0)
Alkaline Phosphatase: 49 U/L (ref 38–126)
Anion gap: 3 — ABNORMAL LOW (ref 5–15)
BUN: 8 mg/dL (ref 8–23)
CO2: 25 mmol/L (ref 22–32)
Calcium: 8.5 mg/dL — ABNORMAL LOW (ref 8.9–10.3)
Chloride: 109 mmol/L (ref 98–111)
Creatinine, Ser: 0.83 mg/dL (ref 0.61–1.24)
GFR, Estimated: >60 mL/min (ref >60)
Glucose, Bld: 95 mg/dL (ref 70–99)
Potassium: 3.1 mmol/L — ABNORMAL LOW (ref 3.5–5.1)
Sodium: 137 mmol/L (ref 135–145)
Total Bilirubin: 0.6 mg/dL (ref 0.3–1.2)
Total Protein: 6.1 g/dL — ABNORMAL LOW (ref 6.5–8.1)

## 2021-08-02 LAB — CBC WITH DIFFERENTIAL/PLATELET
Abs Immature Granulocytes: 0.02 10*3/uL (ref 0.00–0.07)
Basophils Absolute: 0 10*3/uL (ref 0.0–0.1)
Basophils Relative: 1 %
Eosinophils Absolute: 0.4 10*3/uL (ref 0.0–0.5)
Eosinophils Relative: 8 %
HCT: 40.7 % (ref 39.0–52.0)
Hemoglobin: 13.3 g/dL (ref 13.0–17.0)
Immature Granulocytes: 0 %
Lymphocytes Relative: 38 %
Lymphs Abs: 2.1 10*3/uL (ref 0.7–4.0)
MCH: 29.1 pg (ref 26.0–34.0)
MCHC: 32.7 g/dL (ref 30.0–36.0)
MCV: 89.1 fL (ref 80.0–100.0)
Monocytes Absolute: 0.5 10*3/uL (ref 0.1–1.0)
Monocytes Relative: 9 %
Neutro Abs: 2.6 10*3/uL (ref 1.7–7.7)
Neutrophils Relative %: 44 %
Platelets: 146 10*3/uL — ABNORMAL LOW (ref 150–400)
RBC: 4.57 MIL/uL (ref 4.22–5.81)
RDW: 18.1 % — ABNORMAL HIGH (ref 11.5–15.5)
WBC: 5.7 10*3/uL (ref 4.0–10.5)
nRBC: 0 % (ref 0.0–0.2)

## 2021-08-02 LAB — MAGNESIUM: Magnesium: 2 mg/dL (ref 1.7–2.4)

## 2021-08-17 ENCOUNTER — Other Ambulatory Visit: Payer: Self-pay | Admitting: Oncology

## 2021-08-18 NOTE — Telephone Encounter (Signed)
Comprehensive Metabolic Panel (CMP) ?Order: [id number] ? Ref Range & Units 7 d ago  ?Potassium 3.5 - 5.0 mmol/L 3.4 Low    ? ?

## 2021-08-19 ENCOUNTER — Encounter: Payer: Self-pay | Admitting: Oncology

## 2021-08-23 ENCOUNTER — Encounter: Payer: Self-pay | Admitting: Oncology

## 2021-08-23 ENCOUNTER — Inpatient Hospital Stay: Payer: Medicare Other | Attending: Oncology

## 2021-08-23 ENCOUNTER — Inpatient Hospital Stay (HOSPITAL_BASED_OUTPATIENT_CLINIC_OR_DEPARTMENT_OTHER): Payer: Medicare Other | Admitting: Oncology

## 2021-08-23 VITALS — BP 129/95 | HR 97 | Temp 98.7°F | Resp 20 | Wt 214.6 lb

## 2021-08-23 DIAGNOSIS — D696 Thrombocytopenia, unspecified: Secondary | ICD-10-CM | POA: Insufficient documentation

## 2021-08-23 DIAGNOSIS — E876 Hypokalemia: Secondary | ICD-10-CM | POA: Diagnosis not present

## 2021-08-23 DIAGNOSIS — I10 Essential (primary) hypertension: Secondary | ICD-10-CM | POA: Insufficient documentation

## 2021-08-23 DIAGNOSIS — C9000 Multiple myeloma not having achieved remission: Secondary | ICD-10-CM

## 2021-08-23 DIAGNOSIS — R918 Other nonspecific abnormal finding of lung field: Secondary | ICD-10-CM | POA: Diagnosis not present

## 2021-08-23 DIAGNOSIS — D649 Anemia, unspecified: Secondary | ICD-10-CM

## 2021-08-23 DIAGNOSIS — G620 Drug-induced polyneuropathy: Secondary | ICD-10-CM | POA: Diagnosis not present

## 2021-08-23 DIAGNOSIS — Z8042 Family history of malignant neoplasm of prostate: Secondary | ICD-10-CM | POA: Diagnosis not present

## 2021-08-23 DIAGNOSIS — Z808 Family history of malignant neoplasm of other organs or systems: Secondary | ICD-10-CM | POA: Insufficient documentation

## 2021-08-23 DIAGNOSIS — D708 Other neutropenia: Secondary | ICD-10-CM

## 2021-08-23 DIAGNOSIS — Z9481 Bone marrow transplant status: Secondary | ICD-10-CM

## 2021-08-23 DIAGNOSIS — T451X5A Adverse effect of antineoplastic and immunosuppressive drugs, initial encounter: Secondary | ICD-10-CM

## 2021-08-23 DIAGNOSIS — F17210 Nicotine dependence, cigarettes, uncomplicated: Secondary | ICD-10-CM | POA: Insufficient documentation

## 2021-08-23 DIAGNOSIS — I700 Atherosclerosis of aorta: Secondary | ICD-10-CM | POA: Insufficient documentation

## 2021-08-23 LAB — CBC WITH DIFFERENTIAL/PLATELET
Abs Immature Granulocytes: 0.01 10*3/uL (ref 0.00–0.07)
Basophils Absolute: 0 10*3/uL (ref 0.0–0.1)
Basophils Relative: 1 %
Eosinophils Absolute: 0.1 10*3/uL (ref 0.0–0.5)
Eosinophils Relative: 3 %
HCT: 42.6 % (ref 39.0–52.0)
Hemoglobin: 13.6 g/dL (ref 13.0–17.0)
Immature Granulocytes: 0 %
Lymphocytes Relative: 36 %
Lymphs Abs: 1.7 10*3/uL (ref 0.7–4.0)
MCH: 28.8 pg (ref 26.0–34.0)
MCHC: 31.9 g/dL (ref 30.0–36.0)
MCV: 90.3 fL (ref 80.0–100.0)
Monocytes Absolute: 0.4 10*3/uL (ref 0.1–1.0)
Monocytes Relative: 9 %
Neutro Abs: 2.5 10*3/uL (ref 1.7–7.7)
Neutrophils Relative %: 51 %
Platelets: 138 10*3/uL — ABNORMAL LOW (ref 150–400)
RBC: 4.72 MIL/uL (ref 4.22–5.81)
RDW: 16.6 % — ABNORMAL HIGH (ref 11.5–15.5)
WBC: 4.8 10*3/uL (ref 4.0–10.5)
nRBC: 0 % (ref 0.0–0.2)

## 2021-08-23 LAB — MAGNESIUM: Magnesium: 1.9 mg/dL (ref 1.7–2.4)

## 2021-08-23 LAB — COMPREHENSIVE METABOLIC PANEL
ALT: 9 U/L (ref 0–44)
AST: 13 U/L — ABNORMAL LOW (ref 15–41)
Albumin: 3.6 g/dL (ref 3.5–5.0)
Alkaline Phosphatase: 51 U/L (ref 38–126)
Anion gap: 6 (ref 5–15)
BUN: 11 mg/dL (ref 8–23)
CO2: 24 mmol/L (ref 22–32)
Calcium: 8.6 mg/dL — ABNORMAL LOW (ref 8.9–10.3)
Chloride: 108 mmol/L (ref 98–111)
Creatinine, Ser: 0.75 mg/dL (ref 0.61–1.24)
GFR, Estimated: >60 mL/min (ref >60)
Glucose, Bld: 102 mg/dL — ABNORMAL HIGH (ref 70–99)
Potassium: 3.3 mmol/L — ABNORMAL LOW (ref 3.5–5.1)
Sodium: 138 mmol/L (ref 135–145)
Total Bilirubin: 1.1 mg/dL (ref 0.3–1.2)
Total Protein: 6.5 g/dL (ref 6.5–8.1)

## 2021-08-23 NOTE — Progress Notes (Signed)
?Hematology/Oncology Progress note ?[phone] [phone] ?  ? ? ? ?Patient Care Team: ?Sofie Hartigan, MD as PCP - General (Family Medicine) ?Noreene Filbert, MD as Radiation Oncologist (Radiation Oncology) ? ?REFERRING PROVIDER: ?Sofie Hartigan, MD  ?CHIEF COMPLAINTS/REASON FOR VISIT:  ?Follow-up for multiple myeloma ?HISTORY OF PRESENTING ILLNESS:  ? ?Tommy Smith. is a  66 y.o.  male with PMH listed below was seen in consultation at the request of  Feldpausch, Chrissie Noa, MD  for evaluation of abnormal serum protein electrophoresis. ? ?01/20/2020, serum protein electrophoresis showed increased monoclonal component.  ?Increase calcium level at 12.3, PTH was normal.  Creatinine 1, estimated GFR 91, CBC showed hemoglobin 12.4, ?Patient also recently had a CT chest abdomen pelvis with contrast for evaluation of right flank pain and right lower lobe nodule. ?01/29/2020, CT showed multiple bilateral pulmonary nodules measuring up to 10 mm in the right middle lobe, nodules are indeterminate, infectious/inflammatory etiology versus metastatic disease. ?Multiple osseous lytic lesions, largest lesion involving the left pubic bone concerning for metastatic disease. ?Compression fractures at T9, age indeterminate. ?Appearance focal area of thickening at the gastroesophageal junction.  Further evaluation with upper GI study or direct visualization with endoscopy is recommended ?No bowel obstruction. ?Aortic atherosclerosis. ? ?Patient reports NSAIDs as needed for lower back pain.  Mid lower back pain usually is worse when when he stands up from sitting position.  Patient works in the [employer].  Lives at home with wife.  Denies any unintentional weight loss, fever, chills, night sweating ? ?# IgG multiple myeloma, stage II, del 1 p, high risk ?Baseline M protein 4.1, kappa light chain level 333.8 ?Beta-2 microglobulin 2.1, albumin 2.7. ?02/12/2020, bone marrow biopsy showed hypercellular for  age, 80% plasma cell which are complex restricted by light chain in situ heparinization.  Background hematopoiesis is present but reduced. ?Cytogenetics is normal, Myeloma FISH panel-deletion 1P,Standard risk. ?He is not interested in  ?# 02/27/2020 patient has been started on Revlimid 25 mg D1-14 ?#02/24/2020- 08/26/2020  Velcade weekly with dexamethasone 20 mg weekly ? ?# 08/14/2020 M protein 0.5, close to 90% reduction from original M protein of 4.1, light chain ration 1.76 ?# 08/18/2020  Bone marrow results were reviewed.  1% plasma cell.  Normal cytogenetics.  Myeloma FISH negative for 1p del. ?#09/01/2020, patient agreed for second opinion at [facility] for evaluation and discussion of possible bone marrow transplant. ?He prefers to hold off maintenance treatment until his [place] appointment.  ? ?#Focal area of thickening at the GE junction. 10/23/2020 EGD is normal.   ?#Lung nodules, measures up to 10 mm on the right middle lobe.  Indeterminate.  Attention on follow-up.  Recommend repeat CT chest and he declined ? ?11/03/2020, M protein 0.6, lambda free light chain 4.9, kappa light chain 14.8, kappa lambda light chain ratio 3.02. ?Discussed about proceed with 1 cycle of Revlimid 25 mg 2 weeks on 1 week off while waiting for [facility] second opinion.  He agrees with the plan. ? ?12/09/2020 Evaluated by Dr.Gasparetto at [facility].  ?Recommend patient to resume VRD.  There was plan of bone marrow biopsy to be repeated at [facility].  ? ?#03/10/2021 COVID 19 positive.  Seen at emergency room, omit D15 dose of Velcade for cycle 12.  ? ?INTERVAL HISTORY ?Tommy Smith. is a 66 y.o. 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 ?Overall he did very well.  Aspirin, atorvastatin, BP medications, potassium have been held.  Patient takes acyclovir for viral prophylaxis. ?Patient reports feeling well. ?No new complaints.  Denies any  fever, chills, nausea vomiting diarrhea.  Weight is stable, gained 1 pound since last visit.  Patient recently was seen by [facility] pulmonary transplant team.  He has an appointment at [facility] on 09/15/2021 for bone marrow biopsy and will see Dr. Miki Kins around 09/23/2021. ? ? ?Review of Systems  ?Constitutional:  Positive for unexpected weight change. Negative for appetite change, chills, fatigue and fever.  ?HENT:   Negative for hearing loss and voice change.   ?Eyes:  Negative for eye problems and icterus.  ?Respiratory:  Negative for chest tightness, cough and shortness of breath.   ?Cardiovascular:  Negative for chest pain and leg swelling.  ?Gastrointestinal:  Negative for abdominal distention, abdominal pain and constipation.  ?Endocrine: Negative for hot flashes.  ?Genitourinary:  Negative for difficulty urinating, dysuria and frequency.   ?Musculoskeletal:  Negative for arthralgias, back pain and neck pain.  ?Skin:  Negative for itching and rash.  ?Neurological:  Positive for numbness. Negative for light-headedness.  ?Hematological:  Negative for adenopathy. Does not bruise/bleed easily.  ?Psychiatric/Behavioral:  Negative for confusion.   ? ? ?MEDICAL HISTORY:  ?Past Medical History:  ?Diagnosis Date  ? Cancer [facility])   ? Depression   ? Hypercalcemia 03/02/2020  ? Hyperlipemia   ? Hypertension   ? Hypogonadism in male   ? Multiple myeloma ([place])   ? ? ?SURGICAL HISTORY: ?Past Surgical History:  ?Procedure Laterality Date  ? BONE MARROW BIOPSY    ? COLONOSCOPY  07/14/2008  ? ESOPHAGOGASTRODUODENOSCOPY N/A 10/22/2020  ? Procedure: ESOPHAGOGASTRODUODENOSCOPY (EGD);  Surgeon: Jonathon Bellows, MD;  Location: [facility] ENDOSCOPY;  Service: Gastroenterology;  Laterality: N/A;  ? ? ?SOCIAL HISTORY: ?Social History  ? ?Socioeconomic History  ? Marital status: Married  ?  Spouse name: [name]  ? Number of children: 3  ? Years of education: Not on file  ? Highest education level: Not on file  ?Occupational History  ? Not on file   ?Tobacco Use  ? Smoking status: Every Day  ?  Packs/day: 0.25  ?  Years: 30.00  ?  Pack years: 7.50  ?  Types: Cigarettes  ? Smokeless tobacco: Never  ?Substance and Sexual Activity  ? Alcohol use: Yes  ?  Comment: occcassional  ? Drug use: No  ? Sexual activity: Not on file  ?Other Topics Concern  ? Not on file  ?Social History Narrative  ? Not on file  ? ?Social Determinants of Health  ? ?Financial Resource Strain: Not on file  ?Food Insecurity: Not on file  ?Transportation Needs: Not on file  ?Physical Activity: Not on file  ?Stress: Not on file  ?Social Connections: Not on file  ?Intimate Partner Violence: Not on file  ? ? ?FAMILY HISTORY: ?Family History  ?Problem Relation Age of Onset  ? Skin cancer Mother   ? Prostate cancer Father   ? ? ?ALLERGIES:  has No Known Allergies. ? ?MEDICATIONS:  ?Current Outpatient Medications  ?Medication Sig Dispense Refill  ? acyclovir (ZOVIRAX) 400 MG tablet TAKE 1 TABLET BY MOUTH TWICE A DAY 180 tablet 1  ? buPROPion (WELLBUTRIN XL) 300 MG 24 hr tablet Take 300 mg by mouth daily.    ? amLODipine (NORVASC) 10 MG tablet Take by mouth daily. (Patient not taking: Reported on 08/23/2021)    ? ASPIRIN 81 PO Take 81 mg by mouth daily.    ?  atorvastatin (LIPITOR) 40 MG tablet Take 40 mg by mouth daily. (Patient not taking: Reported on 08/23/2021)    ? lenalidomide (REVLIMID) 25 MG capsule Take 1 capsule (25 mg total) by mouth daily. Take 14 days on, 7 days off, repeat every 21 days. 14 capsule 0  ? loperamide (IMODIUM) 2 MG capsule Take by mouth. (Patient not taking: Reported on 08/23/2021)    ? losartan-hydrochlorothiazide (HYZAAR) 100-12.5 MG tablet Take 1 tablet by mouth daily.    ? ondansetron (ZOFRAN) 8 MG tablet Take by mouth. (Patient not taking: Reported on 08/23/2021)    ? ?No current facility-administered medications for this visit.  ? ? ? ?PHYSICAL EXAMINATION: ?ECOG PERFORMANCE STATUS: 1 - Symptomatic but completely ambulatory ?Vitals:  ? 08/23/21 1330  ?BP: (!) 129/95   ?Pulse: 97  ?Resp: 20  ?Temp: 98.7 ?F (37.1 ?C)  ?SpO2: 100%  ? ?Filed Weights  ? 08/23/21 1330  ?Weight: 214 lb 9.6 oz (97.3 kg)  ? ? ?Physical Exam ?Constitutional:   ?   General: He is not in acute distress. ?   Ap

## 2021-08-24 LAB — KAPPA/LAMBDA LIGHT CHAINS
Kappa free light chain: 10.5 mg/L (ref 3.3–19.4)
Kappa, lambda light chain ratio: 1.33 (ref 0.26–1.65)
Lambda free light chains: 7.9 mg/L (ref 5.7–26.3)

## 2021-08-26 LAB — MULTIPLE MYELOMA PANEL, SERUM
Albumin SerPl Elph-Mcnc: 3.4 g/dL (ref 2.9–4.4)
Albumin/Glob SerPl: 1.5 (ref 0.7–1.7)
Alpha 1: 0.2 g/dL (ref 0.0–0.4)
Alpha2 Glob SerPl Elph-Mcnc: 0.7 g/dL (ref 0.4–1.0)
B-Globulin SerPl Elph-Mcnc: 0.9 g/dL (ref 0.7–1.3)
Gamma Glob SerPl Elph-Mcnc: 0.6 g/dL (ref 0.4–1.8)
Globulin, Total: 2.4 g/dL (ref 2.2–3.9)
IgA: 154 mg/dL (ref 61–437)
IgG (Immunoglobin G), Serum: 669 mg/dL (ref 603–1613)
IgM (Immunoglobulin M), Srm: 15 mg/dL — ABNORMAL LOW (ref 20–172)
Total Protein ELP: 5.8 g/dL — ABNORMAL LOW (ref 6.0–8.5)

## 2021-09-29 ENCOUNTER — Inpatient Hospital Stay: Payer: Medicare Other | Attending: Oncology | Admitting: Oncology

## 2021-09-29 ENCOUNTER — Encounter: Payer: Self-pay | Admitting: Oncology

## 2021-09-29 ENCOUNTER — Other Ambulatory Visit: Payer: Self-pay

## 2021-09-29 ENCOUNTER — Telehealth: Payer: Self-pay

## 2021-09-29 ENCOUNTER — Telehealth: Payer: Self-pay | Admitting: Pharmacy Technician

## 2021-09-29 ENCOUNTER — Telehealth: Payer: Self-pay | Admitting: Pharmacist

## 2021-09-29 ENCOUNTER — Other Ambulatory Visit (HOSPITAL_COMMUNITY): Payer: Self-pay

## 2021-09-29 VITALS — BP 121/80 | HR 78 | Temp 97.5°F | Ht 75.0 in | Wt 210.0 lb

## 2021-09-29 DIAGNOSIS — G620 Drug-induced polyneuropathy: Secondary | ICD-10-CM

## 2021-09-29 DIAGNOSIS — Z7961 Long term (current) use of immunomodulator: Secondary | ICD-10-CM | POA: Diagnosis not present

## 2021-09-29 DIAGNOSIS — Z7982 Long term (current) use of aspirin: Secondary | ICD-10-CM | POA: Diagnosis not present

## 2021-09-29 DIAGNOSIS — F17210 Nicotine dependence, cigarettes, uncomplicated: Secondary | ICD-10-CM | POA: Diagnosis not present

## 2021-09-29 DIAGNOSIS — C9000 Multiple myeloma not having achieved remission: Secondary | ICD-10-CM | POA: Diagnosis present

## 2021-09-29 DIAGNOSIS — Z9481 Bone marrow transplant status: Secondary | ICD-10-CM

## 2021-09-29 DIAGNOSIS — Z79899 Other long term (current) drug therapy: Secondary | ICD-10-CM | POA: Insufficient documentation

## 2021-09-29 DIAGNOSIS — T451X5A Adverse effect of antineoplastic and immunosuppressive drugs, initial encounter: Secondary | ICD-10-CM | POA: Diagnosis not present

## 2021-09-29 MED ORDER — LENALIDOMIDE 10 MG PO CAPS: 10.0000 mg | ORAL_CAPSULE | ORAL | 0 refills | Status: DC

## 2021-09-29 NOTE — Telephone Encounter (Signed)
Faxed Prescription for Revlimid to [facility] [address].

## 2021-09-29 NOTE — Telephone Encounter (Signed)
Oral Chemotherapy Pharmacist Encounter   Lenalidomide insurance authorization is good until the end of this year, 04/10/22   Darl Pikes, PharmD, BCPS, BCOP, CPP Hematology/Oncology Clinical Pharmacist [place]/DB/AP Oral [facility] [phone]  09/29/2021 3:27 PM

## 2021-09-29 NOTE — Telephone Encounter (Signed)
Oral Oncology Patient Advocate Encounter  Was successful in securing patient a $12,000 grant from [facility] to provide copayment coverage for Lenalidomide (Revlimid).  This will keep the out of pocket expense at $0.     [employer] ID: [id number]   The billing information is as follows and has been shared with [facility].    RxBin: [id number] PCN: [id number] Member ID: [id number] Group ID: [id number] Dates of Eligibility: 08/30/21 through 08/30/22  Fund:  [facility] [place] Phone [phone] Fax [phone] 09/29/2021 3:35 PM

## 2021-10-01 ENCOUNTER — Encounter: Payer: Self-pay | Admitting: Oncology

## 2021-10-01 DIAGNOSIS — Z9481 Bone marrow transplant status: Secondary | ICD-10-CM | POA: Insufficient documentation

## 2021-10-01 DIAGNOSIS — T451X5A Adverse effect of antineoplastic and immunosuppressive drugs, initial encounter: Secondary | ICD-10-CM | POA: Insufficient documentation

## 2021-10-01 NOTE — Progress Notes (Signed)
Hematology/Oncology Progress note [phone] [phone]      Patient Care Team: Marina Goodell, MD as PCP - General (Family Medicine) Carmina Miller, MD as Radiation Oncologist (Radiation Oncology)   ASSESSMENT & PLAN:   Multiple myeloma not having achieved remission (HCC) #IgG multiple myeloma, stage II, del 1 p, high risk.  Declined bone marrow transplant evaluation. 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 Labs are reviewed and discussed with patient. [facility] oncology notes were reviewed.  Recommendation of resuming Revlimid 10 mg p.o. 3 weeks on 1 week off was reviewed and discussed with patient.  He agrees with the plan. Resume Zometa monthly.  Recommend patient to continue calcium and vitamin D supplementation  [facility] of [facility] marrow [facility]) Patient follows up with oncology for post marrow transplant vaccination. Continue acyclovir 400 mg twice daily for 1 year post transplant   Chemotherapy-induced neuropathy (HCC) Grade 1, stable symptoms.  Observation  Orders Placed This Encounter  Procedures   Basic metabolic panel    Standing Status:   Future    Standing Expiration Date:   09/29/2022   CBC with Differential/Platelet    Standing Status:   Future    Standing Expiration Date:   09/30/2022   Comprehensive metabolic panel    Standing Status:   Future    Standing Expiration Date:   09/29/2022   Kappa/lambda light chains    Standing Status:   Future    Standing Expiration Date:   09/29/2022   Multiple Myeloma Panel (SPEP&IFE w/QIG)    Standing Status:   Future    Standing Expiration Date:   09/29/2022   Lab next week, BMP, Zometa.  Plan to start revlimid when he gets supply.  Lab MD +  zometa 4 weeks after he starts revlimid - cbc cmp mm panel, light chain ratio.    All questions were answered. The patient knows to call the clinic with any problems, questions or concerns.  Rickard Patience, MD, PhD [facility] Hematology Oncology 09/29/2021       CHIEF COMPLAINTS/REASON FOR VISIT:  Follow-up for multiple myeloma HISTORY OF PRESENTING ILLNESS:   Tommy Smith. is a  66 y.o.  male presents for follow-up of management of multiple myeloma Oncology history summary listed as below  Oncology History  Multiple myeloma not having achieved remission (HCC)  01/29/2020 Imaging   CT showed multiple bilateral pulmonary nodules measuring up to 10 mm in the right middle lobe, nodules are indeterminate, infectious/inflammatory etiology versus metastatic disease. Multiple osseous lytic lesions, largest lesion involving the left pubic bone concerning for metastatic disease.Compression fractures at T9, age indeterminate. Appearance focal area of thickening at the gastroesophageal junction.  Further evaluation with upper GI study or direct visualization with ndoscopy is recommended No bowel obstruction.Aortic atherosclerosis.   02/04/2020 Cancer Staging   Staging form: Plasma Cell Myeloma and Plasma Cell Disorders, AJCC 8th Edition - Clinical stage from 02/04/2020: Beta-2-microglobulin (mg/L): 2.1, Albumin (g/dL): 2.7, ISS: Stage II, High-risk cytogenetics: Absent, LDH: Unknown - Signed by Rickard Patience, MD on 05/06/2020   02/21/2020 Initial Diagnosis   Multiple myeloma  Baseline M protein 4.1, kappa light chain level 333.8 Beta-2 microglobulin 2.1, albumin 2.7.  -02/12/2020, bone marrow biopsy showed hypercellular for age, 80% plasma cell which are complex  restricted by light chain in situ heparinization.  Background hematopoiesis is present but reduced. Cytogenetics is normal, Myeloma FISH panel-deletion 1P,Standard risk.   02/27/2020 -  Chemotherapy   Vrd  -Patient initially was not  interested in bone marrow transplant   09/01/2020 Bone Marrow Biopsy   Bone marrow results were reviewed.  1% plasma cell.  Normal cytogenetics.  Myeloma FISH negative for 1p del.   06/25/2021 Bone Marrow Transplant   Patient underwent autologous bone marrow transplant at [facility]    Chemotherapy   Revlimid 10 mg 3 weeks on 1 week off    #Focal area of thickening at the GE junction. 10/23/2020 EGD is normal.   #Lung nodules, measures up to 10 mm on the right middle lobe.  Indeterminate.  Attention on follow-up.  Recommend repeat CT chest and he declined   INTERVAL HISTORY Tommy Elena. is a 66 y.o. male who has above history reviewed by me today presents for follow up visit for multiple myeloma Patient has recently seen by [facility] oncology/pulmonary transplant team. I reviewed the note by Dr.Gasparetto on 09/23/2021.  Recommend patient to resume Revlimid 10 mg 3 weeks on 1 week off.  Resume Zometa.  Patient reports feeling well.  He has no new complaints today.  There is plan for repeat bone marrow biopsy in 3 months.   Review of Systems  Constitutional:  Positive for unexpected weight change. Negative for appetite change, chills, fatigue and fever.  HENT:   Negative for hearing loss and voice change.   Eyes:  Negative for eye problems and icterus.  Respiratory:  Negative for chest tightness, cough and shortness of breath.   Cardiovascular:  Negative for chest pain and leg swelling.  Gastrointestinal:  Negative for abdominal distention, abdominal pain and constipation.  Endocrine: Negative for hot flashes.  Genitourinary:  Negative for difficulty urinating, dysuria and frequency.   Musculoskeletal:  Negative for arthralgias, back pain and neck pain.  Skin:  Negative for itching and rash.  Neurological:  Positive for numbness. Negative for light-headedness.  Hematological:  Negative for adenopathy. Does not bruise/bleed easily.  Psychiatric/Behavioral:  Negative for confusion.       MEDICAL HISTORY:  Past Medical History:  Diagnosis Date   Cancer (HCC)    Depression    Hypercalcemia 03/02/2020   Hyperlipemia    Hypertension    Hypogonadism in male    Multiple myeloma [facility])     SURGICAL HISTORY: Past Surgical History:  Procedure Laterality Date   BONE MARROW BIOPSY     COLONOSCOPY  07/14/2008   ESOPHAGOGASTRODUODENOSCOPY N/A 10/22/2020   Procedure: ESOPHAGOGASTRODUODENOSCOPY (EGD);  Surgeon: Wyline Mood, MD;  Location[facility] ENDOSCOPY;  Service: Gastroenterology;  Laterality: N/A;    SOCIAL HISTORY: Social History   Socioeconomic History   Marital status: Married    Spouse name: Cyndia Bent   Number of children: 3   Years of education: Not on file   Highest education level: Not on file  Occupational History   Not on file  Tobacco Use   Smoking status: Every Day    Packs/day: 0.50    Years: 30.00    Total pack years: 15.00    Types: Cigarettes   Smokeless tobacco: Never  Substance and Sexual Activity   Alcohol use: Yes    Comment: occcassional   Drug use: No   Sexual activity: Not on file  Other Topics Concern   Not on file  Social History Narrative   Not on file   Social Determinants  of Health   Financial Resource Strain: Not on file  Food Insecurity: Not on file  Transportation Needs: Not on file  Physical Activity: Not on file  Stress: Not on file  Social Connections: Not on file  Intimate Partner Violence: Not on file    FAMILY HISTORY: Family History  Problem Relation Age of Onset   Skin cancer Mother    Prostate cancer Father     ALLERGIES:  has No Known Allergies.  MEDICATIONS:  Current Outpatient Medications  Medication Sig Dispense Refill   acyclovir (ZOVIRAX) 400 MG tablet TAKE 1 TABLET BY MOUTH TWICE A DAY 180 tablet 1   amLODipine (NORVASC) 10 MG tablet Take by mouth daily. (Patient not taking: Reported on 08/23/2021)     ASPIRIN 81 PO Take 81 mg by mouth daily.     atorvastatin (LIPITOR) 40 MG tablet Take 40  mg by mouth daily. (Patient not taking: Reported on 09/29/2021)     buPROPion (WELLBUTRIN XL) 300 MG 24 hr tablet Take 300 mg by mouth daily. (Patient not taking: Reported on 09/29/2021)     lenalidomide (REVLIMID) 10 MG capsule Take 1 capsule (10 mg total) by mouth See admin instructions. Take 10mg  daily 3 weeks on 1 week off. 21 capsule 0   loperamide (IMODIUM) 2 MG capsule Take by mouth. (Patient not taking: Reported on 09/29/2021)     losartan-hydrochlorothiazide (HYZAAR) 100-12.5 MG tablet Take 1 tablet by mouth daily.     ondansetron (ZOFRAN) 8 MG tablet Take by mouth. (Patient not taking: Reported on 08/23/2021)     No current facility-administered medications for this visit.     PHYSICAL EXAMINATION: ECOG PERFORMANCE STATUS: 1 - Symptomatic but completely ambulatory Vitals:   09/29/21 1438  BP: 121/80  Pulse: 78  Temp: (!) 97.5 F (36.4 C)   Filed Weights   09/29/21 1438  Weight: 210 lb (95.3 kg)    Physical Exam Constitutional:      General: He is not in acute distress.    Appearance: He is not diaphoretic.  HENT:     Head: Normocephalic and atraumatic.     Nose: Nose normal.     Mouth/Throat:     Pharynx: No oropharyngeal exudate.  Eyes:     General: No scleral icterus.    Pupils: Pupils are equal, round, and reactive to light.  Cardiovascular:     Rate and Rhythm: Normal rate and regular rhythm.     Heart sounds: No murmur heard. Pulmonary:     Effort: Pulmonary effort is normal. No respiratory distress.     Breath sounds: No rales.  Chest:     Chest wall: No tenderness.  Abdominal:     General: There is no distension.     Palpations: Abdomen is soft.     Tenderness: There is no abdominal tenderness.  Musculoskeletal:        General: Normal range of motion.     Cervical back: Normal range of motion and neck supple.  Skin:    General: Skin is warm and dry.     Findings: No erythema.  Neurological:     Mental Status: He is alert and oriented to person,  place, and time.     Cranial Nerves: No cranial nerve deficit.     Motor: No abnormal muscle tone.     Coordination: Coordination normal.  Psychiatric:        Mood and Affect: Affect normal.       LABORATORY DATA:  I have  reviewed the data as listed Lab Results  Component Value Date   WBC 4.8 08/23/2021   HGB 13.6 08/23/2021   HCT 42.6 08/23/2021   MCV 90.3 08/23/2021   PLT 138 (L) 08/23/2021   Recent Labs    01/26/21 1345 02/02/21 1419 07/19/21 1342 08/02/21 1339 08/23/21 1322  NA 139   < > 136 137 138  K 3.7   < > 3.4* 3.1* 3.3*  CL 103   < > 106 109 108  CO2 31   < > 25 25 24   GLUCOSE 88   < > 94 95 102*  BUN 13   < > 10 8 11   CREATININE 0.83   < > 0.94 0.83 0.75  CALCIUM 8.7*   < > 8.0* 8.5* 8.6*  GFRNONAA >60   < > >60 >60 >60  PROT 6.2*   < > 5.8* 6.1* 6.5  ALBUMIN 3.4*   < > 3.1* 3.5 3.6  AST 11*   < > 17 15 13*  ALT 19   < > 15 11 9   ALKPHOS 51   < > 56 49 51  BILITOT 1.4*   < > 0.5 0.6 1.1  BILIDIR 0.3*  --   --   --   --    < > = values in this interval not displayed.    Iron/TIBC/Ferritin/ %Sat No results found for: "IRON", "TIBC", "FERRITIN", "IRONPCTSAT"    RADIOGRAPHIC STUDIES: I have personally reviewed the radiological images as listed and agreed with the findings in the report. No results found.

## 2021-10-01 NOTE — Assessment & Plan Note (Signed)
Grade 1, stable symptoms.  Observation 

## 2021-10-06 ENCOUNTER — Inpatient Hospital Stay: Payer: Medicare Other

## 2021-10-06 ENCOUNTER — Telehealth: Payer: Self-pay

## 2021-10-06 DIAGNOSIS — C9000 Multiple myeloma not having achieved remission: Secondary | ICD-10-CM | POA: Diagnosis not present

## 2021-10-06 LAB — BASIC METABOLIC PANEL
Anion gap: 4 — ABNORMAL LOW (ref 5–15)
BUN: 7 mg/dL — ABNORMAL LOW (ref 8–23)
CO2: 27 mmol/L (ref 22–32)
Calcium: 8.3 mg/dL — ABNORMAL LOW (ref 8.9–10.3)
Chloride: 107 mmol/L (ref 98–111)
Creatinine, Ser: 0.92 mg/dL (ref 0.61–1.24)
GFR, Estimated: >60 mL/min (ref >60)
Glucose, Bld: 110 mg/dL — ABNORMAL HIGH (ref 70–99)
Potassium: 3.5 mmol/L (ref 3.5–5.1)
Sodium: 138 mmol/L (ref 135–145)

## 2021-10-06 NOTE — Progress Notes (Signed)
Calcium 8.3. Per Dr. Tasia Catchings, no Zometa today. Patient is to start taking Calcium 1200 mg daily. Patient made aware. Patient to see MD in 4 weeks. Clinic team made aware. Patient verbalizes understanding and denies any further questions or concerns.

## 2021-10-06 NOTE — Telephone Encounter (Signed)
Pt here today for infuion and inform Cesalea RN that he started taking Revlimid on 10/01/21.   Per MD secure chat:  please schedule him for Lab MD + zometa around 7/21 or 7/24. myeloma lab set  Pt will see appts on Mychart

## 2021-10-06 NOTE — Telephone Encounter (Signed)
-----   Message from Earlie Server, MD sent at 10/01/2021  2:45 PM EDT ----- Teryl Lucy,  Juluis Rainier . I have seen patient recently and recommend patient to resume Revlimid 10 mg 3 weeks on 1 week off. Plan to see him back 4 weeks after started on Revlimid with Zometa.  Thanks  zy

## 2021-10-21 ENCOUNTER — Other Ambulatory Visit: Payer: Self-pay

## 2021-10-21 MED ORDER — LENALIDOMIDE 10 MG PO CAPS: 10.0000 mg | ORAL_CAPSULE | ORAL | 0 refills | Status: DC

## 2021-10-25 NOTE — Telephone Encounter (Signed)
Just FYI.

## 2021-10-25 NOTE — Telephone Encounter (Signed)
Please advise. Any idea of what my be causing this pain to thigh. He was off Revlimid and recently restarted- just FYI.

## 2021-10-26 ENCOUNTER — Encounter: Payer: Self-pay | Admitting: Oncology

## 2021-10-26 ENCOUNTER — Other Ambulatory Visit: Payer: Self-pay

## 2021-10-26 ENCOUNTER — Ambulatory Visit
Admission: RE | Admit: 2021-10-26 | Discharge: 2021-10-26 | Disposition: A | Discharge status: Home / Self Care | Payer: Medicare Other | Source: Ambulatory Visit | Attending: Medical Oncology | Admitting: Medical Oncology

## 2021-10-26 ENCOUNTER — Inpatient Hospital Stay: Payer: Medicare Other | Attending: Oncology | Admitting: Medical Oncology

## 2021-10-26 VITALS — BP 115/90 | HR 94 | Temp 97.5°F | Resp 16 | Wt 210.0 lb

## 2021-10-26 DIAGNOSIS — I809 Phlebitis and thrombophlebitis of unspecified site: Secondary | ICD-10-CM | POA: Diagnosis not present

## 2021-10-26 DIAGNOSIS — I10 Essential (primary) hypertension: Secondary | ICD-10-CM | POA: Diagnosis not present

## 2021-10-26 DIAGNOSIS — C9000 Multiple myeloma not having achieved remission: Secondary | ICD-10-CM | POA: Diagnosis present

## 2021-10-26 DIAGNOSIS — M7989 Other specified soft tissue disorders: Secondary | ICD-10-CM | POA: Insufficient documentation

## 2021-10-26 DIAGNOSIS — M79661 Pain in right lower leg: Secondary | ICD-10-CM

## 2021-10-26 NOTE — Progress Notes (Signed)
Symptom Management [place] at [facility] Telephone:([phone] [phone]  Patient Care Team: Sofie Hartigan, MD as PCP - General (Family Medicine) Noreene Filbert, MD as Radiation Oncologist (Radiation Oncology)   Name of the patient: Tommy Smith  [id number]  1956-02-11   Date of visit: 10/26/21  Reason for Consult: Hamza Empson. is a 66 y.o. male who presents today for:  Patient states that as of Saturday he has noticed a soreness in his right thigh. Felt slightly warn to touch and firm. 5/10. Traveling down leg slowly. Slightly better than when it started. He has tried tylenol and icy/hot which has not helped. Used a compression sleeve which he thinks did help some. He denies fever, injury, chest pain, SOB, calf pain, leg swelling, recent procedure, recent long car ride/airplane ride, history blood clot.   Denies any neurologic complaints. Denies recent fevers or illnesses. Denies any easy bleeding or bruising. Reports good appetite and denies weight loss. Denies chest pain. Denies any nausea, vomiting, constipation, or diarrhea. Denies urinary complaints. Patient offers no further specific complaints today.    PAST MEDICAL HISTORY: Past Medical History:  Diagnosis Date   Cancer ([place])    Depression    Hypercalcemia 03/02/2020   Hyperlipemia    Hypertension    Hypogonadism in male    Multiple myeloma [facility])     PAST SURGICAL HISTORY:  Past Surgical History:  Procedure Laterality Date   BONE MARROW BIOPSY     COLONOSCOPY  07/14/2008   ESOPHAGOGASTRODUODENOSCOPY N/A 10/22/2020   Procedure: ESOPHAGOGASTRODUODENOSCOPY (EGD);  Surgeon: Jonathon Bellows, MD;  Location: [facility] ENDOSCOPY;  Service: Gastroenterology;  Laterality: N/A;    HEMATOLOGY/ONCOLOGY HISTORY:  Oncology History  Multiple myeloma not having achieved remission ([place])  01/29/2020 Imaging   CT showed multiple bilateral pulmonary nodules measuring up to 10 mm  in the right middle lobe, nodules are indeterminate, infectious/inflammatory etiology versus metastatic disease. Multiple osseous lytic lesions, largest lesion involving the left pubic bone concerning for metastatic disease.Compression fractures at T9, age indeterminate. Appearance focal area of thickening at the gastroesophageal junction.  Further evaluation with upper GI study or direct visualization with ndoscopy is recommended No bowel obstruction.Aortic atherosclerosis.   02/04/2020 Cancer Staging   Staging form: Plasma Cell Myeloma and Plasma Cell Disorders, AJCC 8th Edition - Clinical stage from 02/04/2020: Beta-2-microglobulin (mg/L): 2.1, Albumin (g/dL): 2.7, ISS: Stage II, High-risk cytogenetics: Absent, LDH: Unknown - Signed by Earlie Server, MD on 05/06/2020   02/21/2020 Initial Diagnosis   Multiple myeloma  Baseline M protein 4.1, kappa light chain level 333.8 Beta-2 microglobulin 2.1, albumin 2.7.  -02/12/2020, bone marrow biopsy showed hypercellular for age, 80% plasma cell which are complex restricted by light chain in situ heparinization.  Background hematopoiesis is present but reduced. Cytogenetics is normal, Myeloma FISH panel-deletion 1P,Standard risk.   02/27/2020 -  Chemotherapy   Vrd  -Patient initially was not interested in bone marrow transplant   09/01/2020 Bone Marrow Biopsy   Bone marrow results were reviewed.  1% plasma cell.  Normal cytogenetics.  Myeloma FISH negative for 1p del.   06/25/2021 Bone Marrow Transplant   Patient underwent autologous bone marrow transplant at [facility]    Chemotherapy   Revlimid 10 mg 3 weeks on 1 week off     ALLERGIES:  has No Known Allergies.  MEDICATIONS:  Current Outpatient Medications  Medication Sig Dispense Refill   acyclovir (ZOVIRAX) 400 MG tablet TAKE 1 TABLET BY MOUTH TWICE A DAY 180  tablet 1   buPROPion (WELLBUTRIN XL) 300 MG 24 hr tablet Take 300 mg by mouth daily.     calcium carbonate (OS-CAL - DOSED IN MG OF  ELEMENTAL CALCIUM) 1250 (500 Ca) MG tablet Take 1 tablet by mouth daily.     cholecalciferol (VITAMIN D3) 25 MCG (1000 UNIT) tablet Take 2,000 Units by mouth daily.     lenalidomide (REVLIMID) 10 MG capsule Take 1 capsule (10 mg total) by mouth See admin instructions. Take 102m daily 3 weeks on 1 week off. 21 capsule 0   atorvastatin (LIPITOR) 40 MG tablet Take 40 mg by mouth daily. (Patient not taking: Reported on 09/29/2021)     loperamide (IMODIUM) 2 MG capsule Take by mouth. (Patient not taking: Reported on 09/29/2021)     No current facility-administered medications for this visit.    VITAL SIGNS: BP 115/90   Pulse 94   Temp (!) 97.5 F (36.4 C) (Tympanic)   Resp 16   Wt 210 lb (95.3 kg)   SpO2 99%   BMI 26.25 kg/m  Filed Weights   10/26/21 1440  Weight: 210 lb (95.3 kg)    Estimated body mass index is 26.25 kg/m as calculated from the following:   Height as of 09/29/21: _0  (1.905 m).   Weight as of this encounter: 210 lb (95.3 kg).  LABS: CBC:    Component Value Date/Time   WBC 4.8 08/23/2021 1322   HGB 13.6 08/23/2021 1322   HCT 42.6 08/23/2021 1322   PLT 138 (L) 08/23/2021 1322   MCV 90.3 08/23/2021 1322   NEUTROABS 2.5 08/23/2021 1322   LYMPHSABS 1.7 08/23/2021 1322   MONOABS 0.4 08/23/2021 1322   EOSABS 0.1 08/23/2021 1322   BASOSABS 0.0 08/23/2021 1322   Comprehensive Metabolic Panel:    Component Value Date/Time   NA 138 10/06/2021 1220   K 3.5 10/06/2021 1220   CL 107 10/06/2021 1220   CO2 27 10/06/2021 1220   BUN 7 (L) 10/06/2021 1220   CREATININE 0.92 10/06/2021 1220   GLUCOSE 110 (H) 10/06/2021 1220   CALCIUM 8.3 (L) 10/06/2021 1220   AST 13 (L) 08/23/2021 1322   ALT 9 08/23/2021 1322   ALKPHOS 51 08/23/2021 1322   BILITOT 1.1 08/23/2021 1322   PROT 6.5 08/23/2021 1322   ALBUMIN 3.6 08/23/2021 1322    RADIOGRAPHIC STUDIES: No results found.  PERFORMANCE STATUS (ECOG) : 2 - Symptomatic, <50% confined to bed  Review of Systems Unless  otherwise noted, a complete review of systems is negative.  Physical Exam General: NAD Cardiovascular: regular rate and rhythm Pulmonary: clear ant fields, pedal pulses 2+ bilaterally  Extremities: no edema, no joint deformities. Varicose veins noted bilaterally. Calf circumference equal bilaterally. Negative Homans sign.  Skin: See below Neurological: Weakness but otherwise nonfocal  Assessment and Plan- Patient is a 66y.o. male    Encounter Diagnoses  Name Primary?   Multiple myeloma not having achieved remission (HCC)    Phlebitis Yes   Chronic in nature. Complicated symptoms as he is it higher risk of clotting.  New. Ruling out any DVT involvement though less likely. Starting on warm compress, compression sleeve, full strength asa x 7 days. Discussed red flag signs and symptoms.     Patient expressed understanding and was in agreement with this plan. He also understands that He can call clinic at any time with any questions, concerns, or complaints.   Thank you for allowing me to participate in the care of this  very pleasant patient.   Time Total: 25  Visit consisted of counseling and education dealing with the complex and emotionally intense issues of symptom management in the setting of serious illness.Greater than 50%  of this time was spent counseling and coordinating care related to the above assessment and plan.  Signed by: Nelwyn [place], PA-C

## 2021-10-26 NOTE — Progress Notes (Signed)
Pt reports pain and swelling in right leg since Saturday. States that pain started in his thigh, but has moved down into calf. Denies any warmth or redness to the extremity. Denies any recent injury or activities out of the ordinary. Reports pain 5/10.

## 2021-10-26 NOTE — Telephone Encounter (Signed)
Tommy Smith, checking with pt to see if he can come today, but just FYI.

## 2021-10-27 ENCOUNTER — Other Ambulatory Visit: Payer: Self-pay | Admitting: Oncology

## 2021-10-27 ENCOUNTER — Telehealth: Payer: Self-pay | Admitting: Medical Oncology

## 2021-10-27 MED ORDER — APIXABAN (ELIQUIS) VTE STARTER PACK (10MG AND 5MG): ORAL_TABLET | ORAL | 0 refills | Status: DC

## 2021-10-27 NOTE — Telephone Encounter (Signed)
Verified that he was taking his Eliquis- tolerating well. Not taking ASA with it. Symptoms not worsening. No red flags. Will continue to monitor and follow. Reviewed red flags.

## 2021-11-01 ENCOUNTER — Other Ambulatory Visit: Payer: Self-pay

## 2021-11-01 ENCOUNTER — Inpatient Hospital Stay: Payer: Medicare Other | Admitting: Oncology

## 2021-11-01 ENCOUNTER — Inpatient Hospital Stay: Payer: Medicare Other

## 2021-11-01 ENCOUNTER — Encounter: Payer: Self-pay | Admitting: Oncology

## 2021-11-01 VITALS — BP 135/99 | HR 86 | Temp 97.2°F | Resp 18 | Wt 210.5 lb

## 2021-11-01 DIAGNOSIS — I824Y1 Acute embolism and thrombosis of unspecified deep veins of right proximal lower extremity: Secondary | ICD-10-CM | POA: Diagnosis not present

## 2021-11-01 DIAGNOSIS — C9000 Multiple myeloma not having achieved remission: Secondary | ICD-10-CM

## 2021-11-01 DIAGNOSIS — T451X5A Adverse effect of antineoplastic and immunosuppressive drugs, initial encounter: Secondary | ICD-10-CM

## 2021-11-01 DIAGNOSIS — Z5111 Encounter for antineoplastic chemotherapy: Secondary | ICD-10-CM

## 2021-11-01 DIAGNOSIS — I82409 Acute embolism and thrombosis of unspecified deep veins of unspecified lower extremity: Secondary | ICD-10-CM | POA: Insufficient documentation

## 2021-11-01 DIAGNOSIS — G620 Drug-induced polyneuropathy: Secondary | ICD-10-CM

## 2021-11-01 DIAGNOSIS — I82509 Chronic embolism and thrombosis of unspecified deep veins of unspecified lower extremity: Secondary | ICD-10-CM | POA: Insufficient documentation

## 2021-11-01 LAB — COMPREHENSIVE METABOLIC PANEL
ALT: 14 U/L (ref 0–44)
AST: 13 U/L — ABNORMAL LOW (ref 15–41)
Albumin: 3.7 g/dL (ref 3.5–5.0)
Alkaline Phosphatase: 64 U/L (ref 38–126)
Anion gap: 4 — ABNORMAL LOW (ref 5–15)
BUN: 9 mg/dL (ref 8–23)
CO2: 26 mmol/L (ref 22–32)
Calcium: 8.9 mg/dL (ref 8.9–10.3)
Chloride: 108 mmol/L (ref 98–111)
Creatinine, Ser: 0.91 mg/dL (ref 0.61–1.24)
GFR, Estimated: >60 mL/min (ref >60)
Glucose, Bld: 94 mg/dL (ref 70–99)
Potassium: 3.5 mmol/L (ref 3.5–5.1)
Sodium: 138 mmol/L (ref 135–145)
Total Bilirubin: 0.6 mg/dL (ref 0.3–1.2)
Total Protein: 6.8 g/dL (ref 6.5–8.1)

## 2021-11-01 LAB — CBC WITH DIFFERENTIAL/PLATELET
Abs Immature Granulocytes: 0.01 10*3/uL (ref 0.00–0.07)
Basophils Absolute: 0.1 10*3/uL (ref 0.0–0.1)
Basophils Relative: 1 %
Eosinophils Absolute: 0.3 10*3/uL (ref 0.0–0.5)
Eosinophils Relative: 8 %
HCT: 45.2 % (ref 39.0–52.0)
Hemoglobin: 15 g/dL (ref 13.0–17.0)
Immature Granulocytes: 0 %
Lymphocytes Relative: 40 %
Lymphs Abs: 1.7 10*3/uL (ref 0.7–4.0)
MCH: 28.7 pg (ref 26.0–34.0)
MCHC: 33.2 g/dL (ref 30.0–36.0)
MCV: 86.6 fL (ref 80.0–100.0)
Monocytes Absolute: 0.5 10*3/uL (ref 0.1–1.0)
Monocytes Relative: 11 %
Neutro Abs: 1.8 10*3/uL (ref 1.7–7.7)
Neutrophils Relative %: 40 %
Platelets: 206 10*3/uL (ref 150–400)
RBC: 5.22 MIL/uL (ref 4.22–5.81)
RDW: 14.6 % (ref 11.5–15.5)
WBC: 4.3 10*3/uL (ref 4.0–10.5)
nRBC: 0 % (ref 0.0–0.2)

## 2021-11-01 MED ORDER — APIXABAN 5 MG PO TABS: 5.0000 mg | ORAL_TABLET | Freq: Two times a day (BID) | ORAL | 2 refills | Status: DC

## 2021-11-01 MED ORDER — ZOLEDRONIC ACID 4 MG/100ML IV SOLN
4.0000 mg | Freq: Once | INTRAVENOUS | Status: AC
  Administered 2021-11-01: 4 mg via INTRAVENOUS
  Filled 2021-11-01: qty 100

## 2021-11-01 MED ORDER — SODIUM CHLORIDE 0.9 % IV SOLN
INTRAVENOUS | Status: DC
  Filled 2021-11-01: qty 250

## 2021-11-01 NOTE — Assessment & Plan Note (Signed)
#  IgG multiple myeloma, stage II, del 1 p, high risk.  Declined bone marrow transplant evaluation. 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 Labs are reviewed and discussed with patient. Recommendation of resuming Revlimid 10 mg p.o. 3 weeks on 1 week off Resume Zometa monthly.  Recommend patient to continue calcium and vitamin D supplementation

## 2021-11-01 NOTE — Progress Notes (Unsigned)
Hematology/Oncology Progress note [phone] [phone]      Patient Care Team: Sofie Hartigan, MD as PCP - General (Family Medicine) Noreene Filbert, MD as Radiation Oncologist (Radiation Oncology)   ASSESSMENT & PLAN:   Multiple myeloma not having achieved remission ([place]) #IgG multiple myeloma, stage II, del 1 p, high risk.  Declined bone marrow transplant evaluation. 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 Labs are reviewed and discussed with patient. Recommendation of resuming Revlimid 10 mg p.o. 3 weeks on 1 week off Resume Zometa monthly.  Recommend patient to continue calcium and vitamin D supplementation  Encounter for antineoplastic chemotherapy Treatment as planned.  Chemotherapy-induced neuropathy (HCC) Grade 1, stable symptoms.  Observation  Acute deep vein thrombosis (DVT) of lower extremity (HCC) Continue anticoagulation with Eliquis. Stop aspirin 81 mg daily. Orders Placed This Encounter  Procedures   CBC with Differential/Platelet    Standing Status:   Future    Standing Expiration Date:   11/02/2022   Comprehensive metabolic panel    Standing Status:   Future    Standing Expiration Date:   11/02/2022   Kappa/lambda light chains    Standing Status:   Future    Standing Expiration Date:   11/02/2022   Multiple Myeloma Panel (SPEP&IFE w/QIG)    Standing Status:   Future    Standing Expiration Date:   11/02/2022   Lab next week, BMP, Zometa.  Plan to start revlimid when he gets supply.  Lab MD + zometa 4 weeks after he starts revlimid - cbc cmp mm panel, light chain ratio.    All questions were answered. The patient knows to call the clinic with any problems, questions or  concerns.  Earlie Server, MD, PhD [facility] Hematology Oncology 11/01/2021       CHIEF COMPLAINTS/REASON FOR VISIT:  Follow-up for multiple myeloma HISTORY OF PRESENTING ILLNESS:   Tommy Batten. is a  66 y.o.  male presents for follow-up of management of multiple myeloma Oncology history summary listed as below  Oncology History  Multiple myeloma not having achieved remission ([place])  01/29/2020 Imaging   CT showed multiple bilateral pulmonary nodules measuring up to 10 mm in the right middle lobe, nodules are indeterminate, infectious/inflammatory etiology versus metastatic disease. Multiple osseous lytic lesions, largest lesion involving the left pubic bone concerning for metastatic disease.Compression fractures at T9, age indeterminate. Appearance focal area of thickening at the gastroesophageal junction.  Further evaluation with upper GI study or direct visualization with ndoscopy is recommended No bowel obstruction.Aortic atherosclerosis.   02/04/2020 Cancer Staging   Staging form: Plasma Cell Myeloma and Plasma Cell Disorders, AJCC 8th Edition - Clinical stage from 02/04/2020: Beta-2-microglobulin (mg/L): 2.1, Albumin (g/dL): 2.7, ISS: Stage II, High-risk cytogenetics: Absent, LDH: Unknown - Signed by Earlie Server, MD on 05/06/2020   02/21/2020 Initial Diagnosis   Multiple myeloma  Baseline M protein 4.1, kappa light chain level 333.8 Beta-2 microglobulin 2.1, albumin 2.7.  -02/12/2020, bone marrow biopsy showed hypercellular for age, 80% plasma cell which are complex restricted by light chain in situ heparinization.  Background hematopoiesis is present but reduced. Cytogenetics is normal, Myeloma FISH panel-deletion 1P,Standard risk.   02/27/2020 -  Chemotherapy   Vrd  -Patient initially was not interested in bone marrow transplant  09/01/2020 Bone Marrow Biopsy   Bone marrow results were reviewed.  1% plasma cell.  Normal cytogenetics.  Myeloma FISH negative for 1p del.    06/25/2021 Bone Marrow Transplant   Patient underwent autologous bone marrow transplant at [facility]    Chemotherapy   Revlimid 10 mg 3 weeks on 1 week off    #Focal area of thickening at the GE junction. 10/23/2020 EGD is normal.   #Lung nodules, measures up to 10 mm on the right middle lobe.  Indeterminate.  Attention on follow-up.  Recommend repeat CT chest and he declined   INTERVAL HISTORY Tommy Smith. is a 66 y.o. male who has above history reviewed by me today presents for follow up visit for multiple myeloma Patient has recently seen by [facility] oncology/pulmonary transplant team. I reviewed the note by Dr.Gasparetto on 09/23/2021.  Recommend patient to resume Revlimid 10 mg 3 weeks on 1 week off.  Resume Zometa.  Patient reports feeling well.  He has no new complaints today.  There is plan for repeat bone marrow biopsy in 3 months.   Review of Systems  Constitutional:  Positive for unexpected weight change. Negative for appetite change, chills, fatigue and fever.  HENT:   Negative for hearing loss and voice change.   Eyes:  Negative for eye problems and icterus.  Respiratory:  Negative for chest tightness, cough and shortness of breath.   Cardiovascular:  Negative for chest pain and leg swelling.  Gastrointestinal:  Negative for abdominal distention, abdominal pain and constipation.  Endocrine: Negative for hot flashes.  Genitourinary:  Negative for difficulty urinating, dysuria and frequency.   Musculoskeletal:  Negative for arthralgias, back pain and neck pain.  Skin:  Negative for itching and rash.  Neurological:  Positive for numbness. Negative for light-headedness.  Hematological:  Negative for adenopathy. Does not bruise/bleed easily.  Psychiatric/Behavioral:  Negative for confusion.      MEDICAL HISTORY:  Past Medical History:  Diagnosis Date   Cancer ([place])    Depression    Hypercalcemia 03/02/2020   Hyperlipemia    Hypertension    Hypogonadism in male     Multiple myeloma [facility])     SURGICAL HISTORY: Past Surgical History:  Procedure Laterality Date   BONE MARROW BIOPSY     COLONOSCOPY  07/14/2008   ESOPHAGOGASTRODUODENOSCOPY N/A 10/22/2020   Procedure: ESOPHAGOGASTRODUODENOSCOPY (EGD);  Surgeon: Jonathon Bellows, MD;  Location: [facility] ENDOSCOPY;  Service: Gastroenterology;  Laterality: N/A;    SOCIAL HISTORY: Social History   Socioeconomic History   Marital status: Married    Spouse name: [name]   Number of children: 3   Years of education: Not on file   Highest education level: Not on file  Occupational History   Not on file  Tobacco Use   Smoking status: Every Day    Packs/day: 0.50    Years: 30.00    Total pack years: 15.00    Types: Cigarettes   Smokeless tobacco: Never  Substance and Sexual Activity   Alcohol use: Yes    Comment: occcassional   Drug use: No   Sexual activity: Not on file  Other Topics Concern   Not on file  Social History Narrative   Not on file   Social Determinants of Health   Financial Resource Strain: Not on file  Food Insecurity: Not on file  Transportation Needs: Not on file  Physical Activity: Not on file  Stress: Not on file  Social Connections: Not on file  Intimate Partner Violence:  Not on file    FAMILY HISTORY: Family History  Problem Relation Age of Onset   Skin cancer Mother    Prostate cancer Father     ALLERGIES:  has No Known Allergies.  MEDICATIONS:  Current Outpatient Medications  Medication Sig Dispense Refill   acyclovir (ZOVIRAX) 400 MG tablet TAKE 1 TABLET BY MOUTH TWICE A DAY 180 tablet 1   [START ON 12/01/2021] apixaban (ELIQUIS) 5 MG TABS tablet Take 1 tablet (5 mg total) by mouth 2 (two) times daily. 60 tablet 2   APIXABAN (ELIQUIS) VTE STARTER PACK (10MG AND 5MG) Take as directed on package: start with two-62m tablets twice daily for 7 days. On day 8, switch to one-571mtablet twice daily. 1 each 0   buPROPion (WELLBUTRIN XL) 300 MG 24 hr tablet Take 300 mg by  mouth daily.     calcium carbonate (OS-CAL - DOSED IN MG OF ELEMENTAL CALCIUM) 1250 (500 Ca) MG tablet Take 1 tablet by mouth daily.     cholecalciferol (VITAMIN D3) 25 MCG (1000 UNIT) tablet Take 2,000 Units by mouth daily.     lenalidomide (REVLIMID) 10 MG capsule Take 1 capsule (10 mg total) by mouth See admin instructions. Take 1071maily 3 weeks on 1 week off. 21 capsule 0   loperamide (IMODIUM) 2 MG capsule Take by mouth. (Patient not taking: Reported on 09/29/2021)     No current facility-administered medications for this visit.     PHYSICAL EXAMINATION: ECOG PERFORMANCE STATUS: 1 - Symptomatic but completely ambulatory Vitals:   11/01/21 1448  BP: (!) 135/99  Pulse: 86  Resp: 18  Temp: (!) 97.2 F (36.2 C)   Filed Weights   11/01/21 1448  Weight: 210 lb 8 oz (95.5 kg)    Physical Exam Constitutional:      General: He is not in acute distress.    Appearance: He is not diaphoretic.  HENT:     Head: Normocephalic and atraumatic.     Nose: Nose normal.     Mouth/Throat:     Pharynx: No oropharyngeal exudate.  Eyes:     General: No scleral icterus.    Pupils: Pupils are equal, round, and reactive to light.  Cardiovascular:     Rate and Rhythm: Normal rate and regular rhythm.     Heart sounds: No murmur heard. Pulmonary:     Effort: Pulmonary effort is normal. No respiratory distress.     Breath sounds: No rales.  Chest:     Chest wall: No tenderness.  Abdominal:     General: There is no distension.     Palpations: Abdomen is soft.     Tenderness: There is no abdominal tenderness.  Musculoskeletal:        General: Normal range of motion.     Cervical back: Normal range of motion and neck supple.  Skin:    General: Skin is warm and dry.     Findings: No erythema.  Neurological:     Mental Status: He is alert and oriented to person, place, and time.     Cranial Nerves: No cranial nerve deficit.     Motor: No abnormal muscle tone.     Coordination:  Coordination normal.  Psychiatric:        Mood and Affect: Affect normal.       LABORATORY DATA:  I have reviewed the data as listed Lab Results  Component Value Date   WBC 4.3 11/01/2021   HGB 15.0 11/01/2021   HCT  45.2 11/01/2021   MCV 86.6 11/01/2021   PLT 206 11/01/2021   Recent Labs    01/26/21 1345 02/02/21 1419 08/02/21 1339 08/23/21 1322 10/06/21 1220 11/01/21 1431  NA 139   < > 137 138 138 138  K 3.7   < > 3.1* 3.3* 3.5 3.5  CL 103   < > 109 108 107 108  CO2 31   < > 25 24 27 26   GLUCOSE 88   < > 95 102* 110* 94  BUN 13   < > 8 11 7* 9  CREATININE 0.83   < > 0.83 0.75 0.92 0.91  CALCIUM 8.7*   < > 8.5* 8.6* 8.3* 8.9  GFRNONAA >60   < > >60 >60 >60 >60  PROT 6.2*   < > 6.1* 6.5  --  6.8  ALBUMIN 3.4*   < > 3.5 3.6  --  3.7  AST 11*   < > 15 13*  --  13*  ALT 19   < > 11 9  --  14  ALKPHOS 51   < > 49 51  --  64  BILITOT 1.4*   < > 0.6 1.1  --  0.6  BILIDIR 0.3*  --   --   --   --   --    < > = values in this interval not displayed.    Iron/TIBC/Ferritin/ %Sat No results found for: "IRON", "TIBC", "FERRITIN", "IRONPCTSAT"    RADIOGRAPHIC STUDIES: I have personally reviewed the radiological images as listed and agreed with the findings in the report. US Venous Img Lower Unilateral Right  Result Date: 10/26/2021 CLINICAL DATA:  Right leg pain and swelling. EXAM: Right LOWER EXTREMITY VENOUS DOPPLER ULTRASOUND TECHNIQUE: Gray-scale sonography with compression, as well as color and duplex ultrasound, were performed to evaluate the deep venous system(s) from the level of the common femoral vein through the popliteal and proximal calf veins. COMPARISON:  None Available. FINDINGS: VENOUS Partially occlusive thrombus in the right common femoral, superficial femoral, and and posterior tibial vein. Peroneal vein is not identified. Limited views of the contralateral common femoral vein demonstrates partially occlusive thrombus in the common femoral vein. OTHER None.  Limitations: none IMPRESSION: Partially occlusive right deep vein thrombus extending from the calf veins into the common femoral vein with limited examination of the left extremity demonstrating partially occlusive thrombus in the common femoral vein. These results will be called to the ordering clinician or representative by the Radiology Department at the imaging location. Electronically Signed   By: Dahlia Bailiff M.D.   On: 10/26/2021 17:32

## 2021-11-01 NOTE — Assessment & Plan Note (Signed)
Grade 1, stable symptoms.  Observation 

## 2021-11-01 NOTE — Assessment & Plan Note (Addendum)
Continue anticoagulation with Eliquis. Stop aspirin 81 mg daily.

## 2021-11-01 NOTE — Assessment & Plan Note (Signed)
Treatment as planned. 

## 2021-11-01 NOTE — Progress Notes (Unsigned)
Patient here for follow up. Pt was started on Eliquis last week due to blood clot on right leg

## 2021-11-02 ENCOUNTER — Telehealth: Payer: Self-pay

## 2021-11-02 ENCOUNTER — Encounter: Payer: Self-pay | Admitting: Oncology

## 2021-11-02 ENCOUNTER — Other Ambulatory Visit: Payer: Self-pay

## 2021-11-02 LAB — KAPPA/LAMBDA LIGHT CHAINS
Kappa free light chain: 16.7 mg/L (ref 3.3–19.4)
Kappa, lambda light chain ratio: 1.5 (ref 0.26–1.65)
Lambda free light chains: 11.1 mg/L (ref 5.7–26.3)

## 2021-11-02 NOTE — Telephone Encounter (Signed)
Joe from [facility] in [place] calling for clarification on patients Eliquis prescription. He states that there was a new rx for Eliquis starter pack and regular eliquis even though he already finished a starter pack. Does he need to do another starter pack or just go on the regular rx. Callback # [phone]

## 2021-11-02 NOTE — Telephone Encounter (Signed)
-----   Message from Earlie Server, MD sent at 11/02/2021  8:29 AM EDT ----- Please add Zometa to his next visit with me.

## 2021-11-02 NOTE — Telephone Encounter (Signed)
Tommy Smith, please add zometa to next visit

## 2021-11-02 NOTE — Telephone Encounter (Signed)
Called and spoke to Tommy Smith with clarification prescription. He will start the new rx '5mg'$  BID once he completes the eliquis starter pack.

## 2021-11-03 ENCOUNTER — Encounter: Payer: Self-pay | Admitting: Oncology

## 2021-11-05 LAB — MULTIPLE MYELOMA PANEL, SERUM
Albumin SerPl Elph-Mcnc: 3.3 g/dL (ref 2.9–4.4)
Albumin/Glob SerPl: 1.3 (ref 0.7–1.7)
Alpha 1: 0.3 g/dL (ref 0.0–0.4)
Alpha2 Glob SerPl Elph-Mcnc: 0.8 g/dL (ref 0.4–1.0)
B-Globulin SerPl Elph-Mcnc: 1 g/dL (ref 0.7–1.3)
Gamma Glob SerPl Elph-Mcnc: 0.7 g/dL (ref 0.4–1.8)
Globulin, Total: 2.7 g/dL (ref 2.2–3.9)
IgA: 182 mg/dL (ref 61–437)
IgG (Immunoglobin G), Serum: 659 mg/dL (ref 603–1613)
IgM (Immunoglobulin M), Srm: 13 mg/dL — ABNORMAL LOW (ref 20–172)
Total Protein ELP: 6 g/dL (ref 6.0–8.5)

## 2021-11-07 ENCOUNTER — Other Ambulatory Visit: Payer: Self-pay

## 2021-11-09 ENCOUNTER — Other Ambulatory Visit: Payer: Self-pay

## 2021-11-11 ENCOUNTER — Other Ambulatory Visit: Payer: Self-pay

## 2021-11-12 ENCOUNTER — Other Ambulatory Visit: Payer: Self-pay

## 2021-11-24 ENCOUNTER — Other Ambulatory Visit: Payer: Self-pay | Admitting: Pharmacist

## 2021-11-24 DIAGNOSIS — C9000 Multiple myeloma not having achieved remission: Secondary | ICD-10-CM

## 2021-11-24 MED ORDER — LENALIDOMIDE 10 MG PO CAPS: 10.0000 mg | ORAL_CAPSULE | Freq: Every day | ORAL | 0 refills | Status: DC

## 2021-12-02 ENCOUNTER — Encounter: Payer: Self-pay | Admitting: Oncology

## 2021-12-02 ENCOUNTER — Inpatient Hospital Stay: Payer: Medicare Other

## 2021-12-02 ENCOUNTER — Inpatient Hospital Stay: Payer: Medicare Other | Attending: Oncology

## 2021-12-02 ENCOUNTER — Inpatient Hospital Stay (HOSPITAL_BASED_OUTPATIENT_CLINIC_OR_DEPARTMENT_OTHER): Payer: Medicare Other | Admitting: Oncology

## 2021-12-02 DIAGNOSIS — I82411 Acute embolism and thrombosis of right femoral vein: Secondary | ICD-10-CM | POA: Diagnosis not present

## 2021-12-02 DIAGNOSIS — G620 Drug-induced polyneuropathy: Secondary | ICD-10-CM | POA: Insufficient documentation

## 2021-12-02 DIAGNOSIS — Z5111 Encounter for antineoplastic chemotherapy: Secondary | ICD-10-CM | POA: Diagnosis not present

## 2021-12-02 DIAGNOSIS — Z9481 Bone marrow transplant status: Secondary | ICD-10-CM | POA: Diagnosis not present

## 2021-12-02 DIAGNOSIS — C9000 Multiple myeloma not having achieved remission: Secondary | ICD-10-CM | POA: Diagnosis present

## 2021-12-02 DIAGNOSIS — T451X5A Adverse effect of antineoplastic and immunosuppressive drugs, initial encounter: Secondary | ICD-10-CM

## 2021-12-02 DIAGNOSIS — Z7901 Long term (current) use of anticoagulants: Secondary | ICD-10-CM | POA: Diagnosis not present

## 2021-12-02 DIAGNOSIS — K5909 Other constipation: Secondary | ICD-10-CM

## 2021-12-02 DIAGNOSIS — I824Y1 Acute embolism and thrombosis of unspecified deep veins of right proximal lower extremity: Secondary | ICD-10-CM

## 2021-12-02 LAB — CBC WITH DIFFERENTIAL/PLATELET
Abs Immature Granulocytes: 0 10*3/uL (ref 0.00–0.07)
Basophils Absolute: 0 10*3/uL (ref 0.0–0.1)
Basophils Relative: 1 %
Eosinophils Absolute: 0.2 10*3/uL (ref 0.0–0.5)
Eosinophils Relative: 5 %
HCT: 46.4 % (ref 39.0–52.0)
Hemoglobin: 15.1 g/dL (ref 13.0–17.0)
Immature Granulocytes: 0 %
Lymphocytes Relative: 41 %
Lymphs Abs: 1.4 10*3/uL (ref 0.7–4.0)
MCH: 28.1 pg (ref 26.0–34.0)
MCHC: 32.5 g/dL (ref 30.0–36.0)
MCV: 86.4 fL (ref 80.0–100.0)
Monocytes Absolute: 0.4 10*3/uL (ref 0.1–1.0)
Monocytes Relative: 10 %
Neutro Abs: 1.5 10*3/uL — ABNORMAL LOW (ref 1.7–7.7)
Neutrophils Relative %: 43 %
Platelets: 159 10*3/uL (ref 150–400)
RBC: 5.37 MIL/uL (ref 4.22–5.81)
RDW: 16.7 % — ABNORMAL HIGH (ref 11.5–15.5)
WBC: 3.5 10*3/uL — ABNORMAL LOW (ref 4.0–10.5)
nRBC: 0 % (ref 0.0–0.2)

## 2021-12-02 LAB — COMPREHENSIVE METABOLIC PANEL
ALT: 18 U/L (ref 0–44)
AST: 17 U/L (ref 15–41)
Albumin: 3.9 g/dL (ref 3.5–5.0)
Alkaline Phosphatase: 54 U/L (ref 38–126)
Anion gap: 3 — ABNORMAL LOW (ref 5–15)
BUN: 12 mg/dL (ref 8–23)
CO2: 26 mmol/L (ref 22–32)
Calcium: 8.6 mg/dL — ABNORMAL LOW (ref 8.9–10.3)
Chloride: 108 mmol/L (ref 98–111)
Creatinine, Ser: 0.96 mg/dL (ref 0.61–1.24)
GFR, Estimated: >60 mL/min (ref >60)
Glucose, Bld: 86 mg/dL (ref 70–99)
Potassium: 4 mmol/L (ref 3.5–5.1)
Sodium: 137 mmol/L (ref 135–145)
Total Bilirubin: 1.3 mg/dL — ABNORMAL HIGH (ref 0.3–1.2)
Total Protein: 6.7 g/dL (ref 6.5–8.1)

## 2021-12-02 MED ORDER — SODIUM CHLORIDE 0.9 % IV SOLN
INTRAVENOUS | Status: DC
  Filled 2021-12-02: qty 250

## 2021-12-02 MED ORDER — CALCIUM 600 MG PO TABS: 1800.0000 mg | ORAL_TABLET | Freq: Every day | ORAL | 0 refills | Status: DC

## 2021-12-02 MED ORDER — ZOLEDRONIC ACID 4 MG/100ML IV SOLN
4.0000 mg | Freq: Once | INTRAVENOUS | Status: AC
  Administered 2021-12-02: 4 mg via INTRAVENOUS
  Filled 2021-12-02: qty 100

## 2021-12-02 NOTE — Progress Notes (Signed)
Per Dr Tasia Catchings, [place] to proceed with Zometa, Ca=8.6.

## 2021-12-02 NOTE — Patient Instructions (Signed)

## 2021-12-02 NOTE — Progress Notes (Signed)
Pt here for follow up. No new concerns voiced.   

## 2021-12-03 ENCOUNTER — Encounter: Payer: Self-pay | Admitting: Oncology

## 2021-12-03 ENCOUNTER — Other Ambulatory Visit: Payer: Self-pay

## 2021-12-03 DIAGNOSIS — K5900 Constipation, unspecified: Secondary | ICD-10-CM | POA: Insufficient documentation

## 2021-12-03 LAB — KAPPA/LAMBDA LIGHT CHAINS
Kappa free light chain: 11.2 mg/L (ref 3.3–19.4)
Kappa, lambda light chain ratio: 1.24 (ref 0.26–1.65)
Lambda free light chains: 9 mg/L (ref 5.7–26.3)

## 2021-12-03 NOTE — Assessment & Plan Note (Signed)
#  IgG multiple myeloma, stage II, del 1 p, high risk.  Declined bone marrow transplant evaluation. 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 Labs are reviewed and discussed with patient. Recommend Revlimid 10 mg p.o. 3 weeks on 1 week off- start when he gets supply Resume Zometa monthly.  Recommend patient to continue calcium and vitamin D supplementation

## 2021-12-03 NOTE — Progress Notes (Signed)
Hematology/Oncology Progress note [phone] [phone]      Patient Care Team: Sofie Hartigan, MD as PCP - General (Family Medicine) Noreene Filbert, MD as Radiation Oncologist (Radiation Oncology)   ASSESSMENT & PLAN:   Multiple myeloma not having achieved remission ([place]) #IgG multiple myeloma, stage II, del 1 p, high risk.  Declined bone marrow transplant evaluation. 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 Labs are reviewed and discussed with patient. Recommend Revlimid 10 mg p.o. 3 weeks on 1 week off- start when he gets supply Resume Zometa monthly.  Recommend patient to continue calcium and vitamin D supplementation  Encounter for antineoplastic chemotherapy Treatment as planned.  History of bone marrow transplant [facility]) Patient follows up with oncology for post marrow transplant vaccination. Continue acyclovir 400 mg twice daily for 1 year post transplant -he gets Rx from [facility]  Chemotherapy-induced neuropathy (HCC) Grade 1, stable symptoms.  Observation  Acute deep vein thrombosis (DVT) of lower extremity (HCC) Continue anticoagulation with Eliquis 12m BID Plan to repeat [place] a few months.  No orders of the defined types were placed in this encounter.  Lab MD + zometa 4 weeks   All questions were answered. The patient knows to call the clinic with any problems, questions or concerns.  ZEarlie Server MD, PhD [facility] Hematology Oncology 12/02/2021       CHIEF COMPLAINTS/REASON FOR VISIT:  Follow-up for multiple myeloma HISTORY OF PRESENTING ILLNESS:   Tommy Smith is a  66y.o.  male presents for follow-up of management of multiple myeloma Oncology history summary listed as  below  Oncology History  [place]  [place]2020 Imaging   CT showed multiple bilateral pulmonary nodules measuring up to 10 mm in the right middle lobe, nodules are indeterminate, infectious/inflammatory etiology versus metastatic disease. Multiple osseous lytic lesions, largest lesion involving the left pubic bone concerning for metastatic disease.Compression fractures at T9, age indeterminate. Appearance focal area of thickening at the gastroesophageal junction.  Further evaluation with upper GI study or direct visualization with ndoscopy is recommended No bowel obstruction.Aortic atherosclerosis.   02/04/2020 Cancer Staging   Staging form: Plasma Cell Myeloma and Plasma Cell Disorders, AJCC 8th Edition - Clinical stage from 02/04/2020: Beta-2-microglobulin (mg/L): 2.1, Albumin (g/dL): 2.7, ISS: Stage II, High-risk cytogenetics: Absent, LDH: Unknown - Signed by YEarlie Server MD on 05/06/2020   02/21/2020 Initial Diagnosis   Multiple myeloma  Baseline M protein 4.1, kappa light chain level 333.8 Beta-2 microglobulin 2.1, albumin 2.7.  -02/12/2020, bone marrow biopsy showed hypercellular for age, 80% plasma cell which are complex restricted by light chain in situ heparinization.  Background hematopoiesis is present but reduced. Cytogenetics is normal, Myeloma FISH panel-deletion 1P,Standard risk.   02/27/2020 - 05/06/2021 Chemotherapy   VRd     09/01/2020 Bone Marrow Biopsy   Bone marrow results were reviewed.  1% plasma cell.  Normal cytogenetics.  Myeloma FISH negative for 1p del.   04/21/2021 Bone Marrow Biopsy   Bone marrow after 14 cycles of VRD -[January 2023 ]showed plasma cells 2%.-   06/25/2021 Bone Marrow Transplant   [facility] -  Chemotherapy   Revlimid 10 mg 3 weeks on 1 week off    #  Focal area of thickening at the GE junction. 10/23/2020 EGD is normal.   #Lung nodules, measures  up to 10 mm on the right middle lobe.  Indeterminate.  Attention on follow-up.  Recommend repeat CT chest and he declined  10/28/2011, unilateral right lower extremity ultrasound showed partially occlusive right deep vein thrombosis extending from the calf vein into the, femoral vein. Patient was recommended to start on Eliquis for anticoagulation.  He tolerates well.  Right thigh pain has improved. INTERVAL HISTORY Tommy Smith. is a 66 y.o. male who has above history reviewed by me today presents for follow up visit for multiple myeloma Patient is currently on Revlimid 10 mg 3 weeks on 1 week off for maintenance..   Leg pain has resolved.  Current everyday smoker, 10 cigarettes daily.  Review of Systems  Constitutional:  Negative for appetite change, chills, fatigue, fever and unexpected weight change.  HENT:   Negative for hearing loss and voice change.   Eyes:  Negative for eye problems and icterus.  Respiratory:  Negative for chest tightness, cough and shortness of breath.   Cardiovascular:  Negative for chest pain and leg swelling.  Gastrointestinal:  Negative for abdominal distention, abdominal pain and constipation.  Endocrine: Negative for hot flashes.  Genitourinary:  Negative for difficulty urinating, dysuria and frequency.   Musculoskeletal:  Negative for arthralgias, back pain and neck pain.  Skin:  Negative for itching and rash.  Neurological:  Positive for numbness. Negative for light-headedness.  Hematological:  Negative for adenopathy. Does not bruise/bleed easily.  Psychiatric/Behavioral:  Negative for confusion.      MEDICAL HISTORY:  Past Medical History:  Diagnosis Date   Cancer ([place])    Depression    Hypercalcemia 03/02/2020   Hyperlipemia    Hypertension    Hypogonadism in male    Multiple myeloma [facility])     SURGICAL HISTORY: Past Surgical History:  Procedure Laterality Date   BONE MARROW BIOPSY     COLONOSCOPY  07/14/2008    ESOPHAGOGASTRODUODENOSCOPY N/A 10/22/2020   Procedure: ESOPHAGOGASTRODUODENOSCOPY (EGD);  Surgeon: Jonathon Bellows, MD;  Location: [facility] ENDOSCOPY;  Service: Gastroenterology;  Laterality: N/A;    SOCIAL HISTORY: Social History   Socioeconomic History   Marital status: Married    Spouse name: [name]   Number of children: 3   Years of education: Not on file   Highest education level: Not on file  Occupational History   Not on file  Tobacco Use   Smoking status: Every Day    Packs/day: 0.50    Years: 30.00    Total pack years: 15.00    Types: Cigarettes   Smokeless tobacco: Never  Substance and Sexual Activity   Alcohol use: Yes    Comment: occcassional   Drug use: No   Sexual activity: Not on file  Other Topics Concern   Not on file  Social History Narrative   Not on file   Social Determinants of Health   Financial Resource Strain: Not on file  Food Insecurity: Not on file  Transportation Needs: Not on file  Physical Activity: Not on file  Stress: Not on file  Social Connections: Not on file  Intimate Partner Violence: Not on file    FAMILY HISTORY: Family History  Problem Relation Age of Onset   Skin cancer Mother    Prostate cancer Father     ALLERGIES:  has No Known Allergies.  MEDICATIONS:  Current Outpatient Medications  Medication Sig Dispense Refill   acyclovir (ZOVIRAX)  400 MG tablet TAKE 1 TABLET BY MOUTH TWICE A DAY 180 tablet 1   apixaban (ELIQUIS) 5 MG TABS tablet Take 1 tablet (5 mg total) by mouth 2 (two) times daily. 60 tablet 2   buPROPion (WELLBUTRIN XL) 300 MG 24 hr tablet Take 300 mg by mouth daily.     calcium carbonate (OS-CAL) 600 MG tablet Take 3 tablets (1,800 mg total) by mouth daily. 30 tablet 0   cholecalciferol (VITAMIN D3) 25 MCG (1000 UNIT) tablet Take 2,000 Units by mouth daily.     lenalidomide (REVLIMID) 10 MG capsule Take 1 capsule (10 mg total) by mouth daily. Take for 21 days, then hold for 7 days. Repeat every 28 days. 21  capsule 0   loperamide (IMODIUM) 2 MG capsule Take by mouth. (Patient not taking: Reported on 09/29/2021)     No current facility-administered medications for this visit.     PHYSICAL EXAMINATION: ECOG PERFORMANCE STATUS: 1 - Symptomatic but completely ambulatory Vitals:   12/02/21 1036  BP: (!) 141/95  Pulse: 82  Resp: 18  Temp: 98.1 F (36.7 C)  SpO2: 100%   Filed Weights   12/02/21 1036  Weight: 214 lb 12.8 oz (97.4 kg)    Physical Exam Constitutional:      General: He is not in acute distress.    Appearance: He is not diaphoretic.  HENT:     Head: Normocephalic and atraumatic.     Nose: Nose normal.     Mouth/Throat:     Pharynx: No oropharyngeal exudate.  Eyes:     General: No scleral icterus.    Pupils: Pupils are equal, round, and reactive to light.  Cardiovascular:     Rate and Rhythm: Normal rate and regular rhythm.     Heart sounds: No murmur heard. Pulmonary:     Effort: Pulmonary effort is normal. No respiratory distress.     Breath sounds: No rales.  Chest:     Chest wall: No tenderness.  Abdominal:     General: There is no distension.     Palpations: Abdomen is soft.     Tenderness: There is no abdominal tenderness.  Musculoskeletal:        General: Normal range of motion.     Cervical back: Normal range of motion and neck supple.  Skin:    General: Skin is warm and dry.     Findings: No erythema.  Neurological:     Mental Status: He is alert and oriented to person, place, and time.     Cranial Nerves: No cranial nerve deficit.     Motor: No abnormal muscle tone.     Coordination: Coordination normal.  Psychiatric:        Mood and Affect: Affect normal.       LABORATORY DATA:  I have reviewed the data as listed Lab Results  Component Value Date   WBC 3.5 (L) 12/02/2021   HGB 15.1 12/02/2021   HCT 46.4 12/02/2021   MCV 86.4 12/02/2021   PLT 159 12/02/2021   Recent Labs    01/26/21 1345 02/02/21 1419 08/23/21 1322  10/06/21 1220 11/01/21 1431 12/02/21 1023  NA 139   < > 138 138 138 137  K 3.7   < > 3.3* 3.5 3.5 4.0  CL 103   < > 108 107 108 108  CO2 31   < > 24 27 26 26   GLUCOSE 88   < > 102* 110* 94 86  BUN 13   < >  11 7* 9 12  CREATININE 0.83   < > 0.75 0.92 0.91 0.96  CALCIUM 8.7*   < > 8.6* 8.3* 8.9 8.6*  GFRNONAA >60   < > >60 >60 >60 >60  PROT 6.2*   < > 6.5  --  6.8 6.7  ALBUMIN 3.4*   < > 3.6  --  3.7 3.9  AST 11*   < > 13*  --  13* 17  ALT 19   < > 9  --  14 18  ALKPHOS 51   < > 51  --  64 54  BILITOT 1.4*   < > 1.1  --  0.6 1.3*  BILIDIR 0.3*  --   --   --   --   --    < > = values in this interval not displayed.    Iron/TIBC/Ferritin/ %Sat No results found for: "IRON", "TIBC", "FERRITIN", "IRONPCTSAT"    RADIOGRAPHIC STUDIES: I have personally reviewed the radiological images as listed and agreed with the findings in the report. US Venous Img Lower Unilateral Right  Result Date: 10/26/2021 CLINICAL DATA:  Right leg pain and swelling. EXAM: Right LOWER EXTREMITY VENOUS DOPPLER ULTRASOUND TECHNIQUE: Gray-scale sonography with compression, as well as color and duplex ultrasound, were performed to evaluate the deep venous system(s) from the level of the common femoral vein through the popliteal and proximal calf veins. COMPARISON:  None Available. FINDINGS: VENOUS Partially occlusive thrombus in the right common femoral, superficial femoral, and and posterior tibial vein. Peroneal vein is not identified. Limited views of the contralateral common femoral vein demonstrates partially occlusive thrombus in the common femoral vein. OTHER None. Limitations: none IMPRESSION: Partially occlusive right deep vein thrombus extending from the calf veins into the common femoral vein with limited examination of the left extremity demonstrating partially occlusive thrombus in the common femoral vein. These results will be called to the ordering clinician or representative by the Radiology Department at  the imaging location. Electronically Signed   By: Dahlia Bailiff M.D.   On: 10/26/2021 17:32

## 2021-12-03 NOTE — Assessment & Plan Note (Signed)
Grade 1, stable symptoms.  Observation 

## 2021-12-03 NOTE — Assessment & Plan Note (Signed)
Recommend stool softner

## 2021-12-03 NOTE — Assessment & Plan Note (Addendum)
Patient follows up with oncology for post marrow transplant vaccination. Continue acyclovir 400 mg twice daily for 1 year post transplant -he gets Rx from [facility] 

## 2021-12-03 NOTE — Assessment & Plan Note (Signed)
Continue anticoagulation with Eliquis '5mg'$  BID Plan to repeat US in a few months.

## 2021-12-03 NOTE — Assessment & Plan Note (Signed)
Treatment as planned. 

## 2021-12-07 LAB — MULTIPLE MYELOMA PANEL, SERUM
Albumin SerPl Elph-Mcnc: 3.7 g/dL (ref 2.9–4.4)
Albumin/Glob SerPl: 1.7 (ref 0.7–1.7)
Alpha 1: 0.2 g/dL (ref 0.0–0.4)
Alpha2 Glob SerPl Elph-Mcnc: 0.7 g/dL (ref 0.4–1.0)
B-Globulin SerPl Elph-Mcnc: 0.9 g/dL (ref 0.7–1.3)
Gamma Glob SerPl Elph-Mcnc: 0.5 g/dL (ref 0.4–1.8)
Globulin, Total: 2.3 g/dL (ref 2.2–3.9)
IgA: 167 mg/dL (ref 61–437)
IgG (Immunoglobin G), Serum: 647 mg/dL (ref 603–1613)
IgM (Immunoglobulin M), Srm: 12 mg/dL — ABNORMAL LOW (ref 20–172)
Total Protein ELP: 6 g/dL (ref 6.0–8.5)

## 2021-12-09 ENCOUNTER — Other Ambulatory Visit (HOSPITAL_COMMUNITY): Payer: Self-pay

## 2021-12-12 IMAGING — CT CT CHEST W/O CM
1 series · 15 of 34 positions shown, 19 images · non-contrast
Comparison: 01/29/2020

CLINICAL DATA: Follow-up lung nodule, multiple myeloma

EXAM:
CT CHEST WITHOUT CONTRAST
TECHNIQUE: Multidetector CT imaging of the chest was performed following the
standard protocol without IV contrast.

[Series 3: thorax · axial · 0.83mm/px · z∈[-502,-244]mm · 15 of 153 slices shown, 19 images]
[im 12/153  mediastinal]
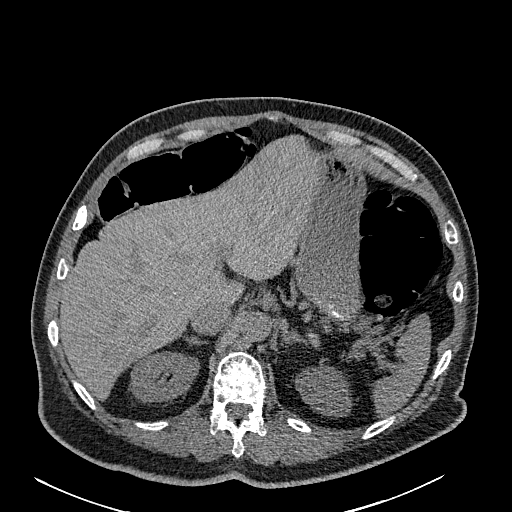
[im 12/153  lung]
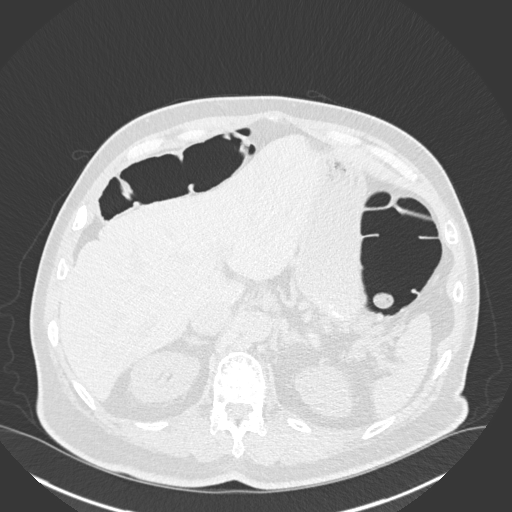
[im 23/153  lung]
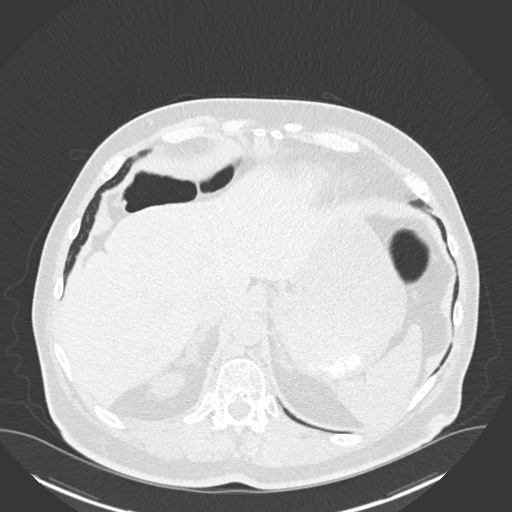
[im 31/153  lung]
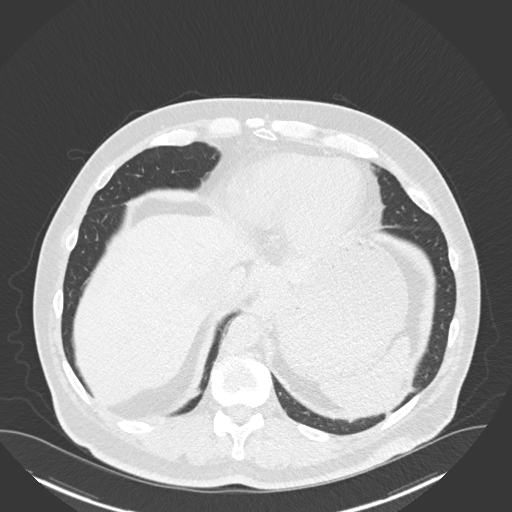
[im 40/153  lung]
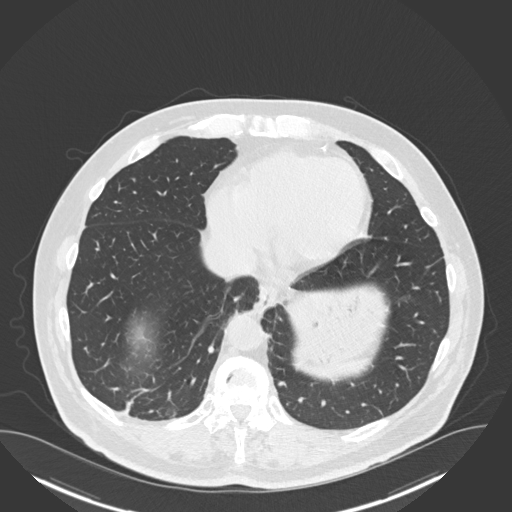
[im 51/153  mediastinal]
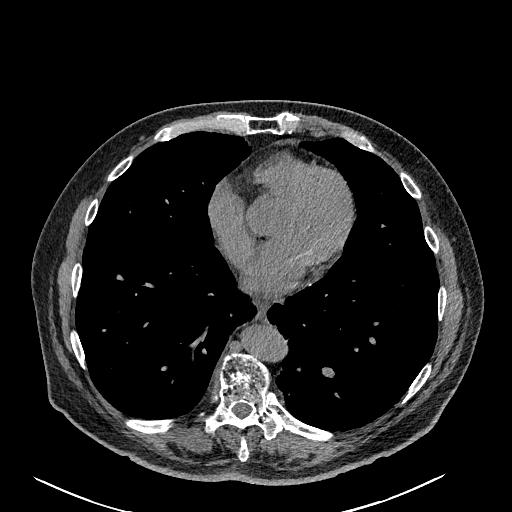
[im 51/153  lung]
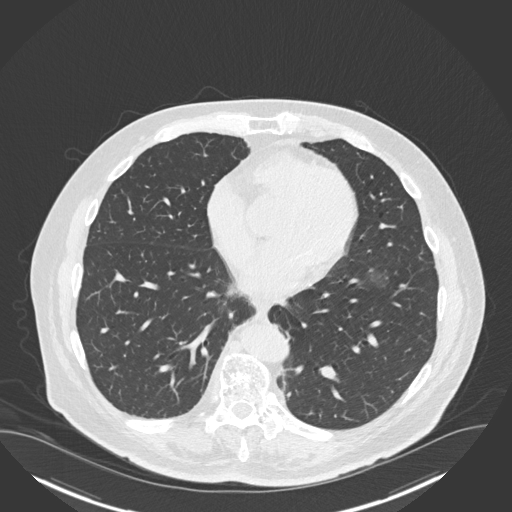
[im 61/153  lung]
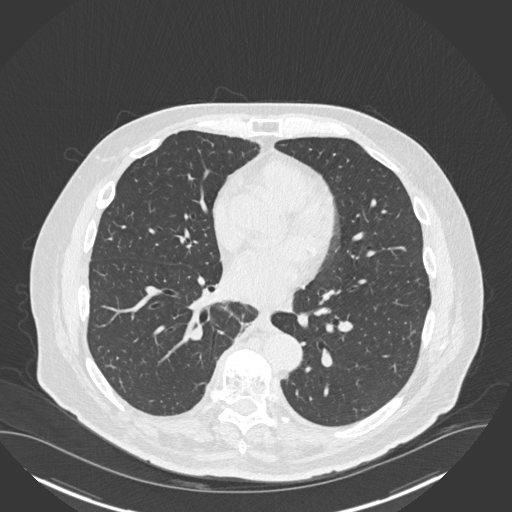
[im 68/153  lung]
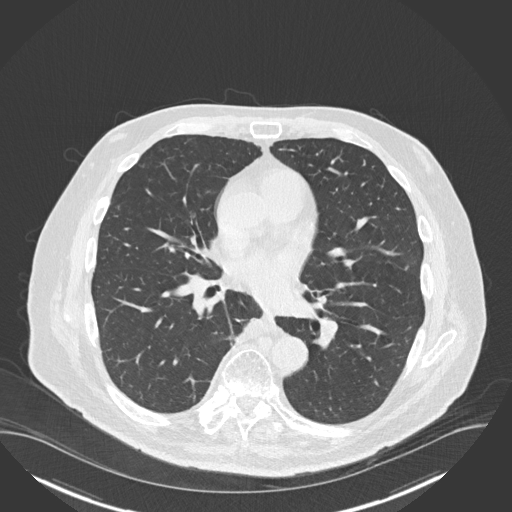
[im 79/153  lung]
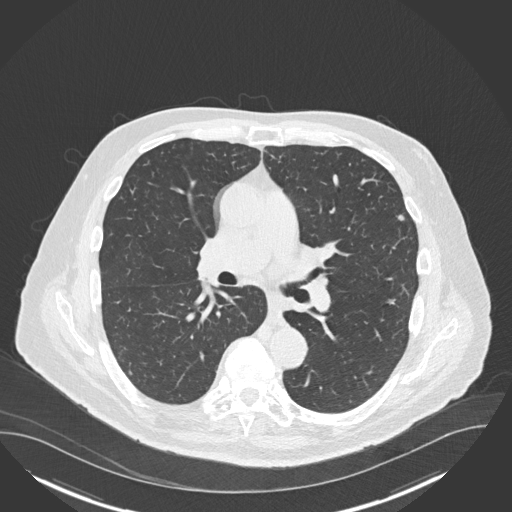
[im 85/153  mediastinal]
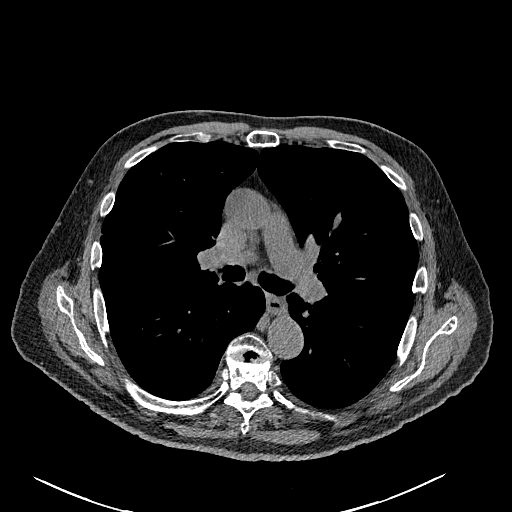
[im 85/153  lung]
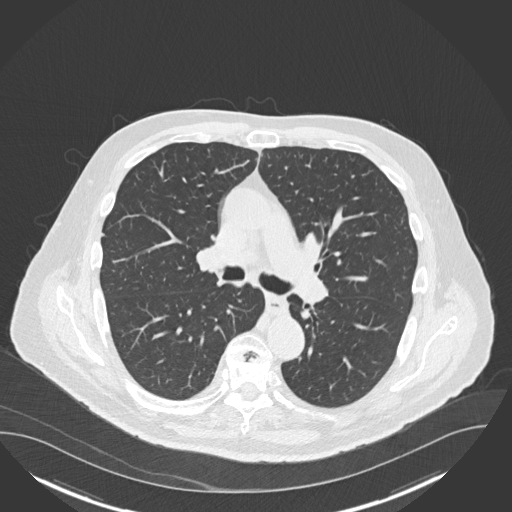
[im 92/153  lung]
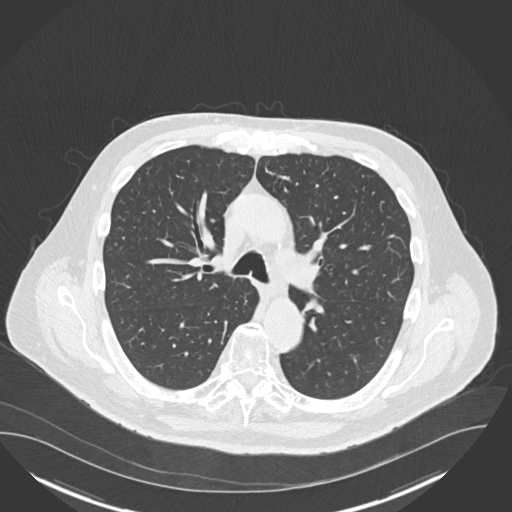
[im 102/153  lung]
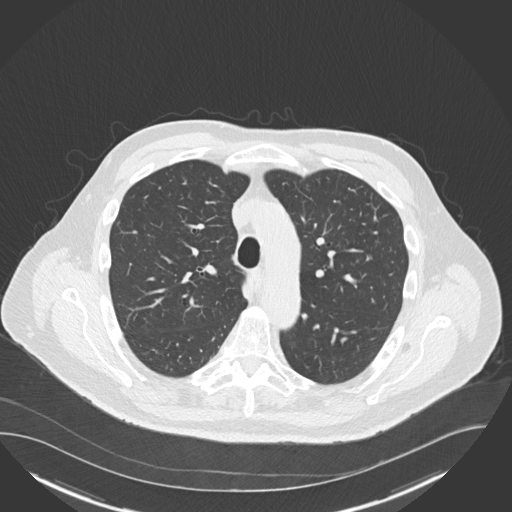
[im 113/153  lung]
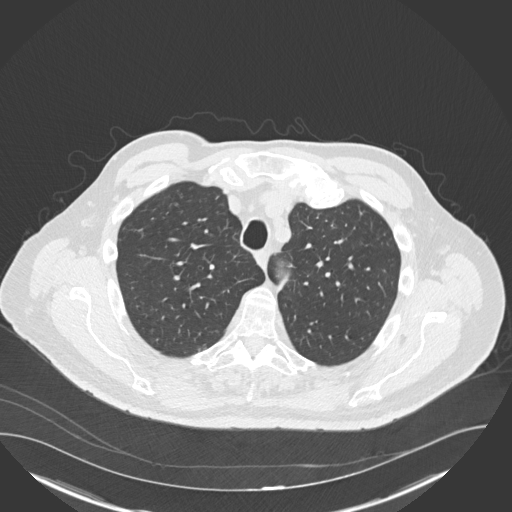
[im 122/153  mediastinal]
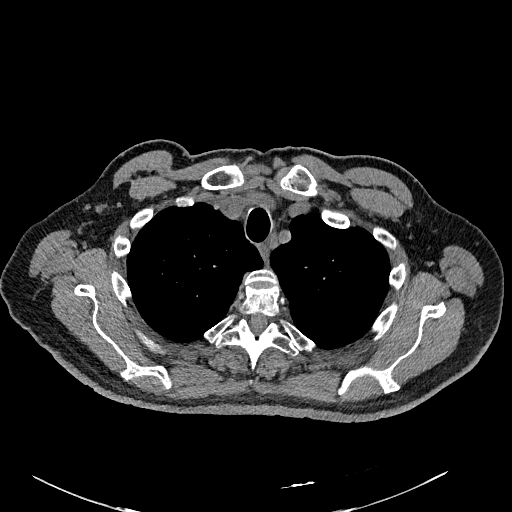
[im 122/153  lung]
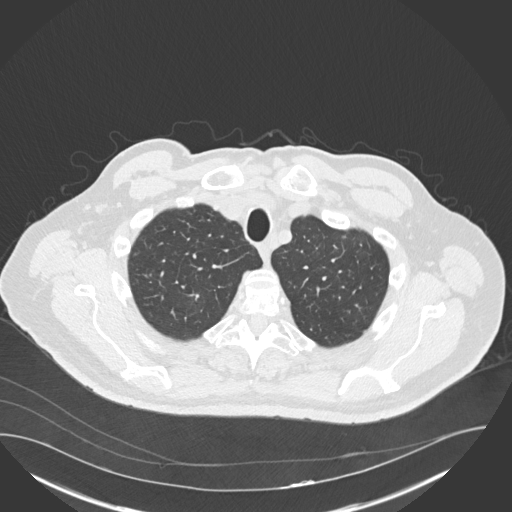
[im 130/153  lung]
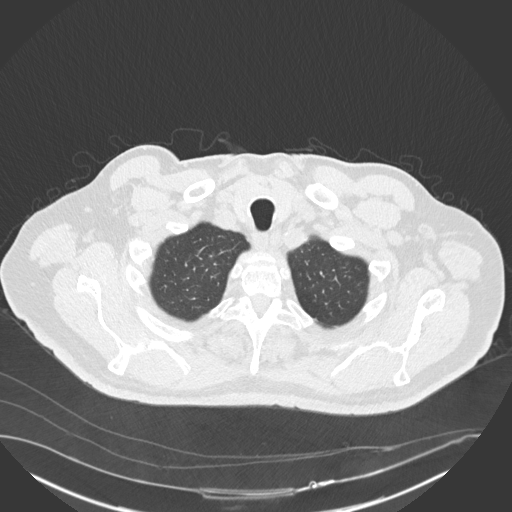
[im 141/153  lung]
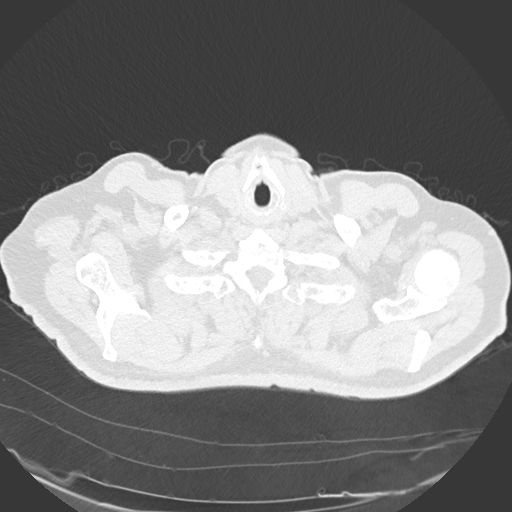

[15 of 34 positions shown; findings below may reference images not displayed]

FINDINGS: Cardiovascular: Scattered aortic atherosclerosis. Normal heart size.
Scattered left and right coronary artery calcifications. No
pericardial effusion.

Mediastinum/Nodes: No enlarged mediastinal, hilar, or axillary lymph
nodes. Thyroid gland, trachea, and esophagus demonstrate no
significant findings.

Lungs/Pleura: Mild, bandlike scarring of the bilateral lung bases.
Multiple small pulmonary nodules are unchanged, largest again a
cm nodule of the lateral segment right middle lobe (series 2, image
29). Additional nodules include a 0.6 cm nodule of the peripheral
right lower lobe (series 2, image 88). No new nodules. No pleural
effusion or pneumothorax.

Upper Abdomen: No acute abnormality.

Musculoskeletal: Widespread, heterogeneously mottled, lytic
appearance of the included osseous structures, with multiple new
vertebral body wedging endplate deformities throughout the included
lower cervical, thoracic, and upper lumbar spine, new wedge or
endplate deformities involving the C7, T3, T7, T10 through T12, and
L1 vertebral bodies. Interval worsening of a previously seen wedge
deformity of T9.
IMPRESSION: 1. Multiple small pulmonary nodules are unchanged, largest again a
1.0 cm nodule of the lateral segment right middle lobe. These are
almost certainly incidental and benign sequelae of prior infection
or inflammation, especially given that pulmonary involvement is very
unusual in multiple myeloma. Continued attention on follow-up if
indicated by oncologic imaging protocol.
2. Widespread, heterogeneously mottled, lytic appearance of the
included osseous structures, in keeping with multiple myeloma.
3. Multiple vertebral body wedge and endplate deformities throughout
the included lower cervical, thoracic, and upper lumbar spine, new
wedge or endplate deformities involving the C7, T3, T7, T10 through
T12, and L1 vertebral bodies. Interval worsening of a previously
seen wedge deformity of T9.
4. Coronary artery disease.

Aortic Atherosclerosis ([id number]-UP9.9).

## 2021-12-23 ENCOUNTER — Other Ambulatory Visit: Payer: Self-pay

## 2021-12-23 DIAGNOSIS — C9000 Multiple myeloma not having achieved remission: Secondary | ICD-10-CM

## 2021-12-23 MED ORDER — LENALIDOMIDE 10 MG PO CAPS: 10.0000 mg | ORAL_CAPSULE | Freq: Every day | ORAL | 0 refills | Status: DC

## 2021-12-29 ENCOUNTER — Other Ambulatory Visit: Payer: Self-pay

## 2021-12-29 DIAGNOSIS — C9000 Multiple myeloma not having achieved remission: Secondary | ICD-10-CM

## 2021-12-30 ENCOUNTER — Encounter: Payer: Self-pay | Admitting: Oncology

## 2021-12-30 ENCOUNTER — Inpatient Hospital Stay (HOSPITAL_BASED_OUTPATIENT_CLINIC_OR_DEPARTMENT_OTHER): Payer: Medicare Other | Admitting: Oncology

## 2021-12-30 ENCOUNTER — Inpatient Hospital Stay: Payer: Medicare Other

## 2021-12-30 ENCOUNTER — Inpatient Hospital Stay: Payer: Medicare Other | Attending: Oncology

## 2021-12-30 DIAGNOSIS — G620 Drug-induced polyneuropathy: Secondary | ICD-10-CM

## 2021-12-30 DIAGNOSIS — D702 Other drug-induced agranulocytosis: Secondary | ICD-10-CM

## 2021-12-30 DIAGNOSIS — E876 Hypokalemia: Secondary | ICD-10-CM | POA: Diagnosis not present

## 2021-12-30 DIAGNOSIS — I824Y1 Acute embolism and thrombosis of unspecified deep veins of right proximal lower extremity: Secondary | ICD-10-CM

## 2021-12-30 DIAGNOSIS — T451X5A Adverse effect of antineoplastic and immunosuppressive drugs, initial encounter: Secondary | ICD-10-CM

## 2021-12-30 DIAGNOSIS — C9000 Multiple myeloma not having achieved remission: Secondary | ICD-10-CM

## 2021-12-30 DIAGNOSIS — Z9481 Bone marrow transplant status: Secondary | ICD-10-CM

## 2021-12-30 DIAGNOSIS — Z5111 Encounter for antineoplastic chemotherapy: Secondary | ICD-10-CM

## 2021-12-30 LAB — CBC WITH DIFFERENTIAL/PLATELET
Abs Immature Granulocytes: 0.01 10*3/uL (ref 0.00–0.07)
Basophils Absolute: 0 10*3/uL (ref 0.0–0.1)
Basophils Relative: 1 %
Eosinophils Absolute: 0.2 10*3/uL (ref 0.0–0.5)
Eosinophils Relative: 5 %
HCT: 43.5 % (ref 39.0–52.0)
Hemoglobin: 14.2 g/dL (ref 13.0–17.0)
Immature Granulocytes: 0 %
Lymphocytes Relative: 43 %
Lymphs Abs: 1.4 10*3/uL (ref 0.7–4.0)
MCH: 28.4 pg (ref 26.0–34.0)
MCHC: 32.6 g/dL (ref 30.0–36.0)
MCV: 87 fL (ref 80.0–100.0)
Monocytes Absolute: 0.3 10*3/uL (ref 0.1–1.0)
Monocytes Relative: 10 %
Neutro Abs: 1.3 10*3/uL — ABNORMAL LOW (ref 1.7–7.7)
Neutrophils Relative %: 41 %
Platelets: 164 10*3/uL (ref 150–400)
RBC: 5 MIL/uL (ref 4.22–5.81)
RDW: 18.8 % — ABNORMAL HIGH (ref 11.5–15.5)
WBC: 3.2 10*3/uL — ABNORMAL LOW (ref 4.0–10.5)
nRBC: 0 % (ref 0.0–0.2)

## 2021-12-30 LAB — COMPREHENSIVE METABOLIC PANEL
ALT: 12 U/L (ref 0–44)
AST: 15 U/L (ref 15–41)
Albumin: 3.7 g/dL (ref 3.5–5.0)
Alkaline Phosphatase: 54 U/L (ref 38–126)
Anion gap: 5 (ref 5–15)
BUN: 10 mg/dL (ref 8–23)
CO2: 26 mmol/L (ref 22–32)
Calcium: 8.6 mg/dL — ABNORMAL LOW (ref 8.9–10.3)
Chloride: 107 mmol/L (ref 98–111)
Creatinine, Ser: 1 mg/dL (ref 0.61–1.24)
GFR, Estimated: >60 mL/min (ref >60)
Glucose, Bld: 84 mg/dL (ref 70–99)
Potassium: 3.1 mmol/L — ABNORMAL LOW (ref 3.5–5.1)
Sodium: 138 mmol/L (ref 135–145)
Total Bilirubin: 1.5 mg/dL — ABNORMAL HIGH (ref 0.3–1.2)
Total Protein: 6.7 g/dL (ref 6.5–8.1)

## 2021-12-30 MED ORDER — ZOLEDRONIC ACID 4 MG/100ML IV SOLN
4.0000 mg | Freq: Once | INTRAVENOUS | Status: AC
  Administered 2021-12-30: 4 mg via INTRAVENOUS
  Filled 2021-12-30: qty 100

## 2021-12-30 MED ORDER — POTASSIUM CHLORIDE CRYS ER 20 MEQ PO TBCR: 20.0000 meq | EXTENDED_RELEASE_TABLET | Freq: Two times a day (BID) | ORAL | 0 refills | Status: DC

## 2021-12-30 MED ORDER — SODIUM CHLORIDE 0.9 % IV SOLN
INTRAVENOUS | Status: DC
  Filled 2021-12-30: qty 250

## 2021-12-30 NOTE — Assessment & Plan Note (Addendum)
Continue anticoagulation with Eliquis '5mg'$  BID  repeat US

## 2021-12-30 NOTE — Assessment & Plan Note (Addendum)
#  IgG multiple myeloma, stage II, del 1 p, high risk.  Declined bone marrow transplant evaluation. 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 Labs are reviewed and discussed with patient. Recommend Revlimid 10 mg p.o. 3 weeks on 1 week off- start when he gets supply Resume Zometa monthly.  Recommend patient to continue calcium and vitamin D supplementation

## 2021-12-30 NOTE — Assessment & Plan Note (Signed)
Patient follows up with oncology for post marrow transplant vaccination. Continue acyclovir 400 mg twice daily for 1 year post transplant -he gets Rx from [facility] 

## 2021-12-30 NOTE — Assessment & Plan Note (Signed)
Treatment as planned. 

## 2021-12-30 NOTE — Assessment & Plan Note (Signed)
ANC 1.3.  Continue monitor.  Due to chemotherapy.

## 2021-12-30 NOTE — Assessment & Plan Note (Signed)
Grade 1, stable symptoms.  Observation 

## 2021-12-30 NOTE — Assessment & Plan Note (Signed)
Recommend potassium chloride 20 mEq daily.  Prescription sent

## 2021-12-30 NOTE — Progress Notes (Signed)
Hematology/Oncology Progress note [phone] [phone]      Patient Care Team: Sofie Hartigan, MD as PCP - General (Family Medicine) Noreene Filbert, MD as Radiation Oncologist (Radiation Oncology)   ASSESSMENT & PLAN:   Cancer Staging  Multiple myeloma not having achieved remission [facility]) Staging form: Plasma Cell Myeloma and Plasma Cell Disorders, AJCC 8th Edition - Clinical stage from 02/04/2020: Beta-2-microglobulin (mg/L): 2.1, Albumin (g/dL): 2.7, ISS: Stage II, High-risk cytogenetics: Absent, LDH: Unknown - Signed by Earlie Server, MD on 05/06/2020   Multiple myeloma not having achieved remission ([place]) #IgG multiple myeloma, stage II, del 1 p, high risk.  Declined bone marrow transplant evaluation. 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 Labs are reviewed and discussed with patient. Recommend Revlimid 10 mg p.o. 3 weeks on 1 week off- start when he gets supply Resume Zometa monthly.  Recommend patient to continue calcium and vitamin D supplementation   Encounter for antineoplastic chemotherapy Treatment as planned.  Chemotherapy-induced neuropathy (HCC) Grade 1, stable symptoms.  Observation  Acute deep vein thrombosis (DVT) of lower extremity (HCC) Continue anticoagulation with Eliquis 68m BID  repeat [place]  History of bone marrow transplant ([facility] Patient follows up with oncology for post marrow transplant vaccination. Continue acyclovir 400 mg twice daily for 1 year post transplant -he gets Rx from [facility]  Drug-induced neutropenia ([place] ANC 1.3.  Continue monitor.  Due to chemotherapy.  Hypokalemia Recommend potassium chloride 20 mEq daily.  Prescription sent Orders Placed This Encounter   Procedures   [place]ous Img Lower Unilateral Right    Standing Status:   Future    Standing Expiration Date:   12/30/2022    Order Specific Question:   Reason for Exam (SYMPTOM  OR DIAGNOSIS REQUIRED)    Answer:   history of DVT    Order Specific Question:   Preferred imaging location?    Answer:   [facility]   CBC with Differential/Platelet    Standing Status:   Future    Standing Expiration Date:   12/31/2022   Comprehensive metabolic panel    Standing Status:   Future    Standing Expiration Date:   12/30/2022   Kappa/lambda light chains    Standing Status:   Future    Standing Expiration Date:   12/30/2022   Multiple Myeloma Panel (SPEP&IFE w/QIG)    Standing Status:   Future    Standing Expiration Date:   12/30/2022    Lab MD + zometa 4 weeks   All questions were answered. The patient knows to call the clinic with any problems, questions or concerns.  ZEarlie Server MD, PhD [facility] Hematology Oncology 12/30/2021       CHIEF COMPLAINTS/REASON FOR VISIT:  Follow-up for multiple myeloma HISTORY OF PRESENTING ILLNESS:   Tommy Smith is a  66y.o.  male presents for follow-up of management of multiple myeloma Oncology history summary listed as below  Oncology History  Multiple [place] not having achieved remission ([place]  01/29/2020 Imaging   CT showed multiple bilateral pulmonary nodules measuring up to 10 mm in the right middle lobe, nodules are indeterminate, infectious/inflammatory etiology versus metastatic disease. Multiple osseous lytic lesions, largest lesion involving the left pubic bone concerning for metastatic disease.Compression fractures at T9, age indeterminate.  Appearance focal area of thickening at the gastroesophageal junction.  Further evaluation with upper GI study or direct visualization with ndoscopy is recommended No bowel obstruction.Aortic atherosclerosis.   02/04/2020 Cancer Staging   Staging form: Plasma Cell Myeloma and Plasma Cell  Disorders, AJCC 8th Edition - Clinical stage from 02/04/2020: Beta-2-microglobulin (mg/L): 2.1, Albumin (g/dL): 2.7, ISS: Stage II, High-risk cytogenetics: Absent, LDH: Unknown - Signed by Earlie Server, MD on 05/06/2020   02/21/2020 Initial Diagnosis   Multiple myeloma  Baseline M protein 4.1, kappa light chain level 333.8 Beta-2 microglobulin 2.1, albumin 2.7.  -02/12/2020, bone marrow biopsy showed hypercellular for age, 80% plasma cell which are complex restricted by light chain in situ heparinization.  Background hematopoiesis is present but reduced. Cytogenetics is normal, Myeloma FISH panel-deletion 1P,Standard risk.   02/27/2020 - 05/06/2021 Chemotherapy   VRd     09/01/2020 Bone Marrow Biopsy   Bone marrow results were reviewed.  1% plasma cell.  Normal cytogenetics.  Myeloma FISH negative for 1p del.   04/21/2021 Bone Marrow Biopsy   Bone marrow after 14 cycles of VRD -[January 2023 ]showed plasma cells 2%.-   06/25/2021 Bone Marrow Transplant   Patient underwent autologous bone marrow transplant [facility]   09/2021 -  Chemotherapy   Revlimid 10 mg 3 weeks on 1 week off    #Focal area of thickening at the GE junction. 10/23/2020 EGD is normal.   #Lung nodules, measures up to 10 mm on the right middle lobe.  Indeterminate.  Attention on follow-up.  Recommend repeat CT chest and he declined  10/28/2011, unilateral right lower extremity ultrasound showed partially occlusive right deep vein thrombosis extending from the calf vein into the, femoral vein. Patient was recommended to start on Eliquis for anticoagulation.  He tolerates well.  Right thigh pain has improved. INTERVAL HISTORY Tommy Smith. is a 66 y.o. male who has above history reviewed by me today presents for follow up visit for multiple myeloma Patient is currently on Revlimid 10 mg 3 weeks on 1 week off for maintenance..   Leg pain has resolved.  Current everyday smoker, 10 cigarettes daily.  Patient has upcoming  appointment with [facility] pulmonary transplant team, repeat bone marrow biopsy.  No new complaints. Review of Systems  Constitutional:  Negative for appetite change, chills, fatigue, fever and unexpected weight change.  HENT:   Negative for hearing loss and voice change.   Eyes:  Negative for eye problems and icterus.  Respiratory:  Negative for chest tightness, cough and shortness of breath.   Cardiovascular:  Negative for chest pain and leg swelling.  Gastrointestinal:  Negative for abdominal distention, abdominal pain and constipation.  Endocrine: Negative for hot flashes.  Genitourinary:  Negative for difficulty urinating, dysuria and frequency.   Musculoskeletal:  Negative for arthralgias, back pain and neck pain.  Skin:  Negative for itching and rash.  Neurological:  Positive for numbness. Negative for light-headedness.  Hematological:  Negative for adenopathy. Does not bruise/bleed easily.  Psychiatric/Behavioral:  Negative for confusion.      MEDICAL HISTORY:  Past Medical History:  Diagnosis Date   Cancer ([place])    Depression    Hypercalcemia 03/02/2020   Hyperlipemia    Hypertension    Hypogonadism in male    Multiple myeloma [facility])     SURGICAL HISTORY: Past Surgical History:  Procedure Laterality Date   BONE MARROW BIOPSY     COLONOSCOPY  07/14/2008   ESOPHAGOGASTRODUODENOSCOPY N/A 10/22/2020   Procedure: ESOPHAGOGASTRODUODENOSCOPY (EGD);  Surgeon: Jonathon Bellows, MD;  Location: [facility] ENDOSCOPY;  Service: Gastroenterology;  Laterality: N/A;    SOCIAL HISTORY: Social History   Socioeconomic History   Marital status: Married    Spouse name: [name]   Number of children: 3   Years of education: Not on file   Highest education level: Not on file  Occupational History   Not on file  Tobacco Use   Smoking status: Every Day    Packs/day: 0.50    Years: 30.00    Total pack years: 15.00    Types: Cigarettes   Smokeless tobacco: Never  Substance and Sexual Activity    Alcohol use: Yes    Comment: occcassional   Drug use: No   Sexual activity: Not on file  Other Topics Concern   Not on file  Social History Narrative   Not on file   Social Determinants of Health   Financial Resource Strain: Not on file  Food Insecurity: Not on file  Transportation Needs: Not on file  Physical Activity: Not on file  Stress: Not on file  Social Connections: Not on file  Intimate Partner Violence: Not on file    FAMILY HISTORY: Family History  Problem Relation Age of Onset   Skin cancer Mother    Prostate cancer Father     ALLERGIES:  has No Known Allergies.  MEDICATIONS:  Current Outpatient Medications  Medication Sig Dispense Refill   acyclovir (ZOVIRAX) 400 MG tablet TAKE 1 TABLET BY MOUTH TWICE A DAY 180 tablet 1   apixaban (ELIQUIS) 5 MG TABS tablet Take 1 tablet (5 mg total) by mouth 2 (two) times daily. 60 tablet 2   buPROPion (WELLBUTRIN XL) 300 MG 24 hr tablet Take 300 mg by mouth daily.     calcium carbonate (OS-CAL) 600 MG tablet Take 3 tablets (1,800 mg total) by mouth daily. 30 tablet 0   cholecalciferol (VITAMIN D3) 25 MCG (1000 UNIT) tablet Take 2,000 Units by mouth daily.     lenalidomide (REVLIMID) 10 MG capsule Take 1 capsule (10 mg total) by mouth daily. Take for 21 days, then hold for 7 days. Repeat every 28 days. 21 capsule 0   potassium chloride SA (KLOR-CON M) 20 MEQ tablet Take 1 tablet (20 mEq total) by mouth 2 (two) times daily. 30 tablet 0   sertraline (ZOLOFT) 25 MG tablet Take 1 tablet by mouth daily.     loperamide (IMODIUM) 2 MG capsule Take by mouth. (Patient not taking: Reported on 09/29/2021)     No current facility-administered medications for this visit.   Facility-Administered Medications Ordered in Other Visits  Medication Dose Route Frequency Provider Last Rate Last Admin   0.9 %  sodium chloride infusion   Intravenous Continuous Earlie Server, MD   Stopped at 12/30/21 1144     PHYSICAL EXAMINATION: ECOG PERFORMANCE  STATUS: 1 - Symptomatic but completely ambulatory Vitals:   12/30/21 1029  BP: (!) 135/96  Pulse: 66  Resp: 20  Temp: 97.8 F (36.6 C)  SpO2: 100%   Filed Weights   12/30/21 1029  Weight: 215 lb 12.8 oz (97.9 kg)    Physical Exam Constitutional:      General: He is not in acute distress.    Appearance: He is not diaphoretic.  HENT:     Head: Normocephalic and atraumatic.     Nose: Nose normal.     Mouth/Throat:     Pharynx: No oropharyngeal exudate.  Eyes:     General: No scleral icterus.  Pupils: Pupils are equal, round, and reactive to light.  Cardiovascular:     Rate and Rhythm: Normal rate and regular rhythm.     Heart sounds: No murmur heard. Pulmonary:     Effort: Pulmonary effort is normal. No respiratory distress.     Breath sounds: No rales.  Chest:     Chest wall: No tenderness.  Abdominal:     General: There is no distension.     Palpations: Abdomen is soft.     Tenderness: There is no abdominal tenderness.  Musculoskeletal:        General: Normal range of motion.     Cervical back: Normal range of motion and neck supple.  Skin:    General: Skin is warm and dry.     Findings: No erythema.  Neurological:     Mental Status: He is alert and oriented to person, place, and time.     Cranial Nerves: No cranial nerve deficit.     Motor: No abnormal muscle tone.     Coordination: Coordination normal.  Psychiatric:        Mood and Affect: Affect normal.       LABORATORY DATA:  I have reviewed the data as listed Lab Results  Component Value Date   WBC 3.2 (L) 12/30/2021   HGB 14.2 12/30/2021   HCT 43.5 12/30/2021   MCV 87.0 12/30/2021   PLT 164 12/30/2021   Recent Labs    01/26/21 1345 02/02/21 1419 11/01/21 1431 12/02/21 1023 12/30/21 1006  NA 139   < > 138 137 138  K 3.7   < > 3.5 4.0 3.1*  CL 103   < > 108 108 107  CO2 31   < > 26 26 26   GLUCOSE 88   < > 94 86 84  BUN 13   < > 9 12 10   CREATININE 0.83   < > 0.91 0.96 1.00  CALCIUM  8.7*   < > 8.9 8.6* 8.6*  GFRNONAA >60   < > >60 >60 >60  PROT 6.2*   < > 6.8 6.7 6.7  ALBUMIN 3.4*   < > 3.7 3.9 3.7  AST 11*   < > 13* 17 15  ALT 19   < > 14 18 12   ALKPHOS 51   < > 64 54 54  BILITOT 1.4*   < > 0.6 1.3* 1.5*  BILIDIR 0.3*  --   --   --   --    < > = values in this interval not displayed.    Iron/TIBC/Ferritin/ %Sat No results found for: "IRON", "TIBC", "FERRITIN", "IRONPCTSAT"    RADIOGRAPHIC STUDIES: I have personally reviewed the radiological images as listed and agreed with the findings in the report. US Venous Img Lower Unilateral Right  Result Date: 10/26/2021 CLINICAL DATA:  Right leg pain and swelling. EXAM: Right LOWER EXTREMITY VENOUS DOPPLER ULTRASOUND TECHNIQUE: Gray-scale sonography with compression, as well as color and duplex ultrasound, were performed to evaluate the deep venous system(s) from the level of the common femoral vein through the popliteal and proximal calf veins. COMPARISON:  None Available. FINDINGS: VENOUS Partially occlusive thrombus in the right common femoral, superficial femoral, and and posterior tibial vein. Peroneal vein is not identified. Limited views of the contralateral common femoral vein demonstrates partially occlusive thrombus in the common femoral vein. OTHER None. Limitations: none IMPRESSION: Partially occlusive right deep vein thrombus extending from the calf veins into the common femoral vein with limited examination of  the left extremity demonstrating partially occlusive thrombus in the common femoral vein. These results will be called to the ordering clinician or representative by the Radiology Department at the imaging location. Electronically Signed   By: Dahlia Bailiff M.D.   On: 10/26/2021 17:32

## 2021-12-31 LAB — KAPPA/LAMBDA LIGHT CHAINS
Kappa free light chain: 11.9 mg/L (ref 3.3–19.4)
Kappa, lambda light chain ratio: 1.24 (ref 0.26–1.65)
Lambda free light chains: 9.6 mg/L (ref 5.7–26.3)

## 2022-01-05 LAB — MULTIPLE MYELOMA PANEL, SERUM
Albumin SerPl Elph-Mcnc: 3.5 g/dL (ref 2.9–4.4)
Albumin/Glob SerPl: 1.6 (ref 0.7–1.7)
Alpha 1: 0.2 g/dL (ref 0.0–0.4)
Alpha2 Glob SerPl Elph-Mcnc: 0.7 g/dL (ref 0.4–1.0)
B-Globulin SerPl Elph-Mcnc: 0.9 g/dL (ref 0.7–1.3)
Gamma Glob SerPl Elph-Mcnc: 0.6 g/dL (ref 0.4–1.8)
Globulin, Total: 2.3 g/dL (ref 2.2–3.9)
IgA: 172 mg/dL (ref 61–437)
IgG (Immunoglobin G), Serum: 623 mg/dL (ref 603–1613)
IgM (Immunoglobulin M), Srm: 13 mg/dL — ABNORMAL LOW (ref 20–172)
Total Protein ELP: 5.8 g/dL — ABNORMAL LOW (ref 6.0–8.5)

## 2022-01-13 ENCOUNTER — Ambulatory Visit
Admission: RE | Admit: 2022-01-13 | Discharge: 2022-01-13 | Disposition: A | Discharge status: Home / Self Care | Payer: Medicare Other | Source: Ambulatory Visit | Attending: Oncology | Admitting: Oncology

## 2022-01-13 ENCOUNTER — Other Ambulatory Visit: Payer: Self-pay | Admitting: Oncology

## 2022-01-13 DIAGNOSIS — I824Y1 Acute embolism and thrombosis of unspecified deep veins of right proximal lower extremity: Secondary | ICD-10-CM

## 2022-01-17 ENCOUNTER — Telehealth: Payer: Self-pay

## 2022-01-17 DIAGNOSIS — I824Y1 Acute embolism and thrombosis of unspecified deep veins of right proximal lower extremity: Secondary | ICD-10-CM

## 2022-01-17 NOTE — Telephone Encounter (Signed)
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 I recommend him to see vascular surgeon.  He does not have follow up appt scheduled. Please schedule 4 weeks lab MD Zometa same labs.

## 2022-01-17 NOTE — Telephone Encounter (Signed)
Error

## 2022-01-17 NOTE — Telephone Encounter (Signed)
Pt informed and verbalized understanding. Referral to vascular placed in Epic.   Please schedule pt for Lab/ MD/ Zometa in 4 weeks and inform pt of appt.

## 2022-01-25 ENCOUNTER — Other Ambulatory Visit: Payer: Self-pay | Admitting: *Deleted

## 2022-01-25 DIAGNOSIS — C9000 Multiple myeloma not having achieved remission: Secondary | ICD-10-CM

## 2022-01-25 MED ORDER — LENALIDOMIDE 10 MG PO CAPS: 10.0000 mg | ORAL_CAPSULE | Freq: Every day | ORAL | 0 refills | Status: DC

## 2022-01-26 ENCOUNTER — Encounter: Payer: Self-pay | Admitting: Oncology

## 2022-01-27 ENCOUNTER — Inpatient Hospital Stay: Payer: Medicare Other

## 2022-01-27 ENCOUNTER — Encounter: Payer: Self-pay | Admitting: Oncology

## 2022-01-27 ENCOUNTER — Inpatient Hospital Stay (HOSPITAL_BASED_OUTPATIENT_CLINIC_OR_DEPARTMENT_OTHER): Payer: Medicare Other | Admitting: Oncology

## 2022-01-27 ENCOUNTER — Inpatient Hospital Stay: Payer: Medicare Other | Attending: Oncology

## 2022-01-27 VITALS — BP 135/93 | HR 66 | Temp 96.6°F | Resp 18 | Wt 215.9 lb

## 2022-01-27 DIAGNOSIS — Z7961 Long term (current) use of immunomodulator: Secondary | ICD-10-CM | POA: Diagnosis not present

## 2022-01-27 DIAGNOSIS — I824Y1 Acute embolism and thrombosis of unspecified deep veins of right proximal lower extremity: Secondary | ICD-10-CM | POA: Diagnosis not present

## 2022-01-27 DIAGNOSIS — D702 Other drug-induced agranulocytosis: Secondary | ICD-10-CM

## 2022-01-27 DIAGNOSIS — G620 Drug-induced polyneuropathy: Secondary | ICD-10-CM | POA: Insufficient documentation

## 2022-01-27 DIAGNOSIS — F17210 Nicotine dependence, cigarettes, uncomplicated: Secondary | ICD-10-CM | POA: Insufficient documentation

## 2022-01-27 DIAGNOSIS — Z9481 Bone marrow transplant status: Secondary | ICD-10-CM | POA: Diagnosis not present

## 2022-01-27 DIAGNOSIS — C9000 Multiple myeloma not having achieved remission: Secondary | ICD-10-CM | POA: Diagnosis present

## 2022-01-27 DIAGNOSIS — Z5111 Encounter for antineoplastic chemotherapy: Secondary | ICD-10-CM

## 2022-01-27 DIAGNOSIS — Z7901 Long term (current) use of anticoagulants: Secondary | ICD-10-CM | POA: Insufficient documentation

## 2022-01-27 DIAGNOSIS — E876 Hypokalemia: Secondary | ICD-10-CM | POA: Insufficient documentation

## 2022-01-27 DIAGNOSIS — T451X5A Adverse effect of antineoplastic and immunosuppressive drugs, initial encounter: Secondary | ICD-10-CM | POA: Diagnosis not present

## 2022-01-27 DIAGNOSIS — R918 Other nonspecific abnormal finding of lung field: Secondary | ICD-10-CM | POA: Insufficient documentation

## 2022-01-27 DIAGNOSIS — Z86718 Personal history of other venous thrombosis and embolism: Secondary | ICD-10-CM | POA: Diagnosis not present

## 2022-01-27 DIAGNOSIS — I10 Essential (primary) hypertension: Secondary | ICD-10-CM | POA: Insufficient documentation

## 2022-01-27 LAB — CBC WITH DIFFERENTIAL/PLATELET
Abs Immature Granulocytes: 0 10*3/uL (ref 0.00–0.07)
Basophils Absolute: 0 10*3/uL (ref 0.0–0.1)
Basophils Relative: 1 %
Eosinophils Absolute: 0.2 10*3/uL (ref 0.0–0.5)
Eosinophils Relative: 6 %
HCT: 43.9 % (ref 39.0–52.0)
Hemoglobin: 14.1 g/dL (ref 13.0–17.0)
Immature Granulocytes: 0 %
Lymphocytes Relative: 46 %
Lymphs Abs: 1.5 10*3/uL (ref 0.7–4.0)
MCH: 29.1 pg (ref 26.0–34.0)
MCHC: 32.1 g/dL (ref 30.0–36.0)
MCV: 90.7 fL (ref 80.0–100.0)
Monocytes Absolute: 0.4 10*3/uL (ref 0.1–1.0)
Monocytes Relative: 12 %
Neutro Abs: 1.1 10*3/uL — ABNORMAL LOW (ref 1.7–7.7)
Neutrophils Relative %: 35 %
Platelets: 142 10*3/uL — ABNORMAL LOW (ref 150–400)
RBC: 4.84 MIL/uL (ref 4.22–5.81)
RDW: 18.7 % — ABNORMAL HIGH (ref 11.5–15.5)
WBC: 3.2 10*3/uL — ABNORMAL LOW (ref 4.0–10.5)
nRBC: 0 % (ref 0.0–0.2)

## 2022-01-27 LAB — COMPREHENSIVE METABOLIC PANEL
ALT: 19 U/L (ref 0–44)
AST: 18 U/L (ref 15–41)
Albumin: 3.9 g/dL (ref 3.5–5.0)
Alkaline Phosphatase: 52 U/L (ref 38–126)
Anion gap: 8 (ref 5–15)
BUN: 12 mg/dL (ref 8–23)
CO2: 24 mmol/L (ref 22–32)
Calcium: 8.9 mg/dL (ref 8.9–10.3)
Chloride: 105 mmol/L (ref 98–111)
Creatinine, Ser: 0.97 mg/dL (ref 0.61–1.24)
GFR, Estimated: >60 mL/min (ref >60)
Glucose, Bld: 87 mg/dL (ref 70–99)
Potassium: 4 mmol/L (ref 3.5–5.1)
Sodium: 137 mmol/L (ref 135–145)
Total Bilirubin: 1 mg/dL (ref 0.3–1.2)
Total Protein: 7 g/dL (ref 6.5–8.1)

## 2022-01-27 MED ORDER — SODIUM CHLORIDE 0.9 % IV SOLN
INTRAVENOUS | Status: DC | PRN
  Filled 2022-01-27: qty 250

## 2022-01-27 MED ORDER — ZOLEDRONIC ACID 4 MG/100ML IV SOLN
4.0000 mg | Freq: Once | INTRAVENOUS | Status: AC
  Administered 2022-01-27: 4 mg via INTRAVENOUS
  Filled 2022-01-27: qty 100

## 2022-01-27 MED ORDER — POTASSIUM CHLORIDE CRYS ER 20 MEQ PO TBCR: 20.0000 meq | EXTENDED_RELEASE_TABLET | Freq: Every day | ORAL | 0 refills | Status: DC

## 2022-01-27 NOTE — Progress Notes (Signed)
Hematology/Oncology Progress note [phone] [phone]      Patient Care Team: Sofie Hartigan, MD as PCP - General (Family Medicine) Noreene Filbert, MD as Radiation Oncologist (Radiation Oncology)   ASSESSMENT & PLAN:   Cancer Staging  Multiple myeloma not having achieved remission [facility]) Staging form: Plasma Cell Myeloma and Plasma Cell Disorders, AJCC 8th Edition - Clinical stage from 02/04/2020: Beta-2-microglobulin (mg/L): 2.1, Albumin (g/dL): 2.7, ISS: Stage II, High-risk cytogenetics: Absent, LDH: Unknown - Signed by Earlie Server, MD on 05/06/2020   Multiple myeloma not having achieved remission ([place]) #IgG multiple myeloma, stage II, del 1 p, high risk.  Declined bone marrow transplant evaluation. 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 Labs are reviewed and discussed with patient. Recommend Revlimid 10 mg p.o. 3 weeks on 1 week off- start when he gets supply Resume Zometa monthly.  Recommend patient to continue calcium and vitamin D supplementation   Encounter for antineoplastic chemotherapy Treatment as planned.  History of bone marrow transplant [facility]) Patient follows up with oncology for post marrow transplant vaccination. Continue acyclovir 400 mg twice daily for 1 year post transplant -he gets Rx from [facility]  Hypokalemia Recommend potassium chloride 20 mEq daily.    Drug-induced neutropenia (HCC) ANC 1.1.  Continue monitor.  Due to chemotherapy.  Chemotherapy-induced neuropathy (HCC) Grade 1, stable symptoms.  Observation  Chronic deep vein thrombosis (DVT) (HCC) Continue anticoagulation with Eliquis 6m BID  repeat [place]  Orders Placed This Encounter   Procedures   CBC with Differential/Platelet    Standing Status:   Future    Standing Expiration Date:   01/28/2023   Comprehensive metabolic panel    Standing Status:   Future    Standing Expiration Date:   01/27/2023   Kappa/lambda light chains    Standing Status:   Future    Standing Expiration Date:   01/27/2023   Multiple Myeloma Panel (SPEP&IFE w/QIG)    Standing Status:   Future    Standing Expiration Date:   01/27/2023    Lab MD + Zometa 4 weeks   All questions were answered. The patient knows to call the clinic with any problems, questions or concerns.  ZEarlie Server MD, PhD [facility] Oncology 01/27/2022       CHIEF COMPLAINTS/REASON FOR VISIT:  Follow-up for multiple myeloma HISTORY OF PRESENTING ILLNESS:   Tommy Smith is a  66y.o.  male presents for follow-up of management of multiple myeloma Oncology history summary listed as below  Oncology History  Multiple myeloma not having achieved remission ([facility]  01/29/2020 Imaging   CT showed multiple bilateral pulmonary nodules measuring up to 10 mm in the right middle lobe, nodules are indeterminate, infectious/inflammatory etiology versus metastatic disease. Multiple osseous lytic lesions, largest lesion involving the left pubic bone concerning for metastatic disease.Compression fractures at T9, age indeterminate. Appearance focal area of thickening at the gastroesophageal junction.  Further evaluation with upper GI study or direct visualization with ndoscopy is recommended No bowel obstruction.Aortic atherosclerosis.   02/04/2020 Cancer Staging   Staging form: Plasma Cell Myeloma and Plasma Cell Disorders, AJCC 8th Edition - Clinical stage from 02/04/2020: Beta-2-microglobulin (mg/L): 2.1, Albumin (g/dL): 2.7, ISS: Stage II, High-risk cytogenetics: Absent, LDH: Unknown - Signed  by Earlie Server, MD on 05/06/2020   02/21/2020 Initial Diagnosis   Multiple myeloma  Baseline M protein 4.1, kappa light  chain level 333.8 Beta-2 microglobulin 2.1, albumin 2.7.  -02/12/2020, bone marrow biopsy showed hypercellular for age, 80% plasma cell which are complex restricted by light chain in situ heparinization.  Background hematopoiesis is present but reduced. Cytogenetics is normal, Myeloma FISH panel-deletion 1P,Standard risk.   02/27/2020 - 05/06/2021 Chemotherapy   VRd     09/01/2020 Bone Marrow Biopsy   Bone marrow results were reviewed.  1% plasma cell.  Normal cytogenetics.  Myeloma FISH negative for 1p del.   04/21/2021 Bone Marrow Biopsy   Bone marrow after 14 cycles of VRD -[January 2023 ]showed plasma cells 2%.-   06/25/2021 Bone Marrow Transplant   Patient underwent autologous bone marrow transplant at [facility]   09/2021 -  Chemotherapy   Revlimid 10 mg 3 weeks on 1 week off   10/26/2021 Imaging     10/26/2021 Imaging   Bilateral lower extremity venous US Partially occlusive right deep vein thrombus extending from the calf veins into the common femoral vein with limited examination of the left extremity demonstrating partially occlusive thrombus in the common femoral vein.     01/13/2022 Imaging   Bilateral lower extremity venous US 1. 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 2. Extensive predominantly occlusive/near occlusive left lower extremity DVT extending from the left common femoral vein through the imaged tibial veins, age-indeterminate in the absence of prior examinations though echogenic thrombus within the left femoral vein suggests a chronic etiology.     01/20/2022 Bone Marrow Biopsy   -Bone marrow biopsy at [facility] showed Cellular bone marrow aspirate with trilineage hematopoiesis and no morphologic evidence of plasma  cell neoplasm.  -Negative minimal residual disease.    #Focal area of thickening at the GE junction. 10/23/2020 EGD is normal.   #Lung nodules, measures up to 10 mm on the right middle lobe.  Indeterminate.  Attention on follow-up.  Recommend repeat CT chest and he declined  10/28/2011, unilateral right lower extremity ultrasound showed partially occlusive right deep vein thrombosis extending from the calf vein into the, femoral vein. Patient was recommended to start on Eliquis for anticoagulation.  He tolerates well.  Right thigh pain has improved.  INTERVAL HISTORY Tommy Smith. is a 66 y.o. male who has above history reviewed by me today presents for follow up visit for multiple myeloma Patient is currently on Revlimid 10 mg 3 weeks on 1 week off for maintenance..   Current everyday smoker, 10 cigarettes daily.   Had bone marrow biopsy at [facility] during the interval Patient denies fever chills.  Review of Systems  Constitutional:  Negative for appetite change, chills, fatigue, fever and unexpected weight change.  HENT:   Negative for hearing loss and voice change.   Eyes:  Negative for eye problems and icterus.  Respiratory:  Negative for chest tightness, cough and shortness of breath.   Cardiovascular:  Negative for chest pain and leg swelling.  Gastrointestinal:  Negative for abdominal distention, abdominal pain and constipation.  Endocrine: Negative for hot flashes.  Genitourinary:  Negative for difficulty urinating, dysuria and frequency.   Musculoskeletal:  Negative for arthralgias, back pain and neck pain.  Skin:  Negative for itching and rash.  Neurological:  Positive for numbness. Negative for light-headedness.  Hematological:  Negative for adenopathy. Does not bruise/bleed easily.  Psychiatric/Behavioral:  Negative for confusion.      MEDICAL HISTORY:  Past Medical History:  Diagnosis Date   Cancer ([place])    Depression    Hypercalcemia 03/02/2020   Hyperlipemia     Hypertension    Hypogonadism in male    Multiple myeloma [facility])     SURGICAL HISTORY: Past Surgical History:  Procedure Laterality Date   BONE MARROW BIOPSY     COLONOSCOPY  07/14/2008   ESOPHAGOGASTRODUODENOSCOPY N/A 10/22/2020   Procedure: ESOPHAGOGASTRODUODENOSCOPY (EGD);  Surgeon: Jonathon Bellows, MD;  Location: [facility] ENDOSCOPY;  Service: Gastroenterology;  Laterality: N/A;    SOCIAL HISTORY: Social History   Socioeconomic History   Marital status: Married    Spouse name: [name]   Number of children: 3   Years of education: Not on file   Highest education level: Not on file  Occupational History   Not on file  Tobacco Use   Smoking status: Every Day    Packs/day: 0.50    Years: 30.00    Total pack years: 15.00    Types: Cigarettes   Smokeless tobacco: Never  Substance and Sexual Activity   Alcohol use: Yes    Comment: occcassional   Drug use: No   Sexual activity: Not on file  Other Topics Concern   Not on file  Social History Narrative   Not on file   Social Determinants of Health   Financial Resource Strain: Not on file  Food Insecurity: Not on file  Transportation Needs: Not on file  Physical Activity: Not on file  Stress: Not on file  Social Connections: Not on file  Intimate Partner Violence: Not on file    FAMILY HISTORY: Family History  Problem Relation Age of Onset   Skin cancer Mother    Prostate cancer Father     ALLERGIES:  has No Known Allergies.  MEDICATIONS:  Current Outpatient Medications  Medication Sig Dispense Refill   acyclovir (ZOVIRAX) 400 MG tablet TAKE 1 TABLET BY MOUTH TWICE A DAY 180 tablet 1   apixaban (ELIQUIS) 5 MG TABS tablet Take 1 tablet (5 mg total) by mouth 2 (two) times daily. 60 tablet 2   buPROPion (WELLBUTRIN XL) 300 MG 24 hr tablet Take 300 mg by mouth daily.     calcium carbonate (OS-CAL) 600 MG tablet Take 3 tablets (1,800 mg total) by mouth daily. 30 tablet 0   cholecalciferol (VITAMIN D3) 25 MCG (1000 UNIT)  tablet Take 2,000 Units by mouth daily.     lenalidomide (REVLIMID) 10 MG capsule Take 1 capsule (10 mg total) by mouth daily. Take for 21 days, then hold for 7 days. Repeat every 28 days. 21 capsule 0   sertraline (ZOLOFT) 25 MG tablet Take 1 tablet by mouth daily.     loperamide (IMODIUM) 2 MG capsule Take by mouth. (Patient not taking: Reported on 09/29/2021)     potassium chloride SA (KLOR-CON M) 20 MEQ tablet Take 1 tablet (20 mEq total) by mouth daily. 30 tablet 0   No current facility-administered medications for this visit.     PHYSICAL EXAMINATION: ECOG PERFORMANCE STATUS: 1 - Symptomatic but completely ambulatory Vitals:   01/27/22 1152  BP: (!) 135/93  Pulse: 66  Resp: 18  Temp: (!) 96.6 F (35.9 C)   Filed Weights   01/27/22 1152  Weight: 215 lb 14.4 oz (97.9 kg)    Physical  Exam Constitutional:      General: He is not in acute distress.    Appearance: He is not diaphoretic.  HENT:     Head: Normocephalic and atraumatic.     Nose: Nose normal.     Mouth/Throat:     Pharynx: No oropharyngeal exudate.  Eyes:     General: No scleral icterus.    Pupils: Pupils are equal, round, and reactive to light.  Cardiovascular:     Rate and Rhythm: Normal rate and regular rhythm.     Heart sounds: No murmur heard. Pulmonary:     Effort: Pulmonary effort is normal. No respiratory distress.     Breath sounds: No rales.  Chest:     Chest wall: No tenderness.  Abdominal:     General: There is no distension.     Palpations: Abdomen is soft.     Tenderness: There is no abdominal tenderness.  Musculoskeletal:        General: Normal range of motion.     Cervical back: Normal range of motion and neck supple.  Skin:    General: Skin is warm and dry.     Findings: No erythema.  Neurological:     Mental Status: He is alert and oriented to person, place, and time.     Cranial Nerves: No cranial nerve deficit.     Motor: No abnormal muscle tone.     Coordination: Coordination  normal.  Psychiatric:        Mood and Affect: Affect normal.       LABORATORY DATA:  I have reviewed the data as listed    Latest Ref Rng & Units 01/27/2022   11:34 AM 12/30/2021   10:06 AM 12/02/2021   10:23 AM  CBC  WBC 4.0 - 10.5 K/uL 3.2  3.2  3.5   Hemoglobin 13.0 - 17.0 g/dL 14.1  14.2  15.1   Hematocrit 39.0 - 52.0 % 43.9  43.5  46.4   Platelets 150 - 400 K/uL 142  164  159       Latest Ref Rng & Units 01/27/2022   11:34 AM 12/30/2021   10:06 AM 12/02/2021   10:23 AM  CMP  Glucose 70 - 99 mg/dL 87  84  86   BUN 8 - 23 mg/dL _0 Creatinine 0.61 - 1.24 mg/dL 0.97  1.00  0.96   Sodium 135 - 145 mmol/L 137  138  137   Potassium 3.5 - 5.1 mmol/L 4.0  3.1  4.0   Chloride 98 - 111 mmol/L 105  107  108   CO2 22 - 32 mmol/L _1 Calcium 8.9 - 10.3 mg/dL 8.9  8.6  8.6   Total Protein 6.5 - 8.1 g/dL 7.0  6.7  6.7   Total Bilirubin 0.3 - 1.2 mg/dL 1.0  1.5  1.3   Alkaline Phos 38 - 126 U/L 52  54  54   AST 15 - 41 U/L _2 ALT 0 - 44 U/L _3 RADIOGRAPHIC STUDIES: I have personally reviewed the radiological images as listed and agreed with the findings in the report. US Venous Img Lower Bilateral (DVT)  Result Date: 01/13/2022 CLINICAL DATA:  History of lower extremity DVT, currently on anticoagulation. Evaluate for acute or chronic DVT. EXAM: BILATERAL LOWER EXTREMITY VENOUS DOPPLER ULTRASOUND TECHNIQUE: Gray-scale sonography with graded compression, as well  as color Doppler and duplex ultrasound were performed to evaluate the lower extremity deep venous systems from the level of the common femoral vein and including the common femoral, femoral, profunda femoral, popliteal and calf veins including the posterior tibial, peroneal and gastrocnemius veins when visible. The superficial great saphenous vein was also interrogated. Spectral Doppler was utilized to evaluate flow at rest and with distal augmentation maneuvers in the common femoral,  femoral and popliteal veins. COMPARISON:  Right lower extremity venous Doppler ultrasound (positive for DVT extending from the right common femoral vein through the right tibial veins as well as DVT involving the left common femoral vein). FINDINGS: RIGHT LOWER EXTREMITY There is hypoechoic near occlusive DVT involving the right common femoral vein (image 7 and 8). The saphenofemoral junction appears patent (image 13), improved compared to the 10/2021 examination. There is hypoechoic near occlusive thrombus involving the imaged portions of the right deep femoral vein (image 16), similar to the 10/2021 examination. There is hypoechoic occlusive thrombus involving the proximal (image 20), mid (image 24) and distal (image 28) aspects of the right femoral vein, similar to the 10/2021 examination. There is mixed echogenic occlusive thrombus involving the right popliteal vein (images 31 and 32), unchanged to slightly worsened compared to the 10/2021 examination, extending to involve both paired divisions of the right posterior tibial vein (image 40) as well as both paired divisions of the right peroneal vein (image 43). Other Findings:  None. LEFT LOWER EXTREMITY There is mixed echogenic near occlusive thrombus involving the distal aspect of the left common femoral vein (image 5), extending to involve the saphenofemoral junction (image 8), similar to the 10/2021 examination. The left deep femoral vein appears patent (image 11). There is echogenic chronic occlusive thrombus involving the proximal (image 15), mid (image 19) and distal (image 23) aspects of the left femoral vein. There is mixed echogenic occlusive thrombus involving the left popliteal vein (image 28 and 32), extending to involve both paired divisions of the left posterior tibial vein (images 35 and 36) as well as the left peroneal vein (image 39). Other Findings:  None. IMPRESSION: 1. Extensive chronic predominantly occlusive/near occlusive right lower  extremity DVT extending from the right common femoral vein through the imaged tibial veins, similar to slightly worsened compared to the 10/2021 examination with suspected worsening of occlusive thrombus within the right popliteal vein. 2. Extensive predominantly occlusive/near occlusive left lower extremity DVT extending from the left common femoral vein through the imaged tibial veins, age-indeterminate in the absence of prior examinations though echogenic thrombus within the left femoral vein suggests a chronic etiology. Electronically Signed   By: Sandi Mariscal M.D.   On: 01/13/2022 11:53

## 2022-01-27 NOTE — Assessment & Plan Note (Addendum)
#  IgG multiple myeloma, stage II, del 1 p, high risk.  Declined bone marrow transplant evaluation. 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 Labs are reviewed and discussed with patient. Recommend Revlimid 10 mg p.o. 3 weeks on 1 week off- start when he gets supply Resume Zometa monthly.  Recommend patient to continue calcium and vitamin D supplementation

## 2022-01-27 NOTE — Assessment & Plan Note (Signed)
Patient follows up with oncology for post marrow transplant vaccination. Continue acyclovir 400 mg twice daily for 1 year post transplant -he gets Rx from [facility] 

## 2022-01-27 NOTE — Assessment & Plan Note (Signed)
Treatment as planned. 

## 2022-01-27 NOTE — Progress Notes (Signed)
Pt here for follow up. No new concerns voiced.   

## 2022-01-27 NOTE — Assessment & Plan Note (Signed)
Recommend potassium chloride 20 mEq daily.   

## 2022-01-27 NOTE — Assessment & Plan Note (Signed)
ANC 1.1.  Continue monitor.  Due to chemotherapy.

## 2022-01-27 NOTE — Assessment & Plan Note (Signed)
Grade 1, stable symptoms.  Observation 

## 2022-01-27 NOTE — Assessment & Plan Note (Signed)
Continue anticoagulation with Eliquis '5mg'$  BID  repeat US

## 2022-01-28 LAB — KAPPA/LAMBDA LIGHT CHAINS
Kappa free light chain: 12.4 mg/L (ref 3.3–19.4)
Kappa, lambda light chain ratio: 1.88 — ABNORMAL HIGH (ref 0.26–1.65)
Lambda free light chains: 6.6 mg/L (ref 5.7–26.3)

## 2022-01-31 LAB — MULTIPLE MYELOMA PANEL, SERUM
Albumin SerPl Elph-Mcnc: 3.5 g/dL (ref 2.9–4.4)
Albumin/Glob SerPl: 1.5 (ref 0.7–1.7)
Alpha 1: 0.2 g/dL (ref 0.0–0.4)
Alpha2 Glob SerPl Elph-Mcnc: 0.7 g/dL (ref 0.4–1.0)
B-Globulin SerPl Elph-Mcnc: 0.9 g/dL (ref 0.7–1.3)
Gamma Glob SerPl Elph-Mcnc: 0.5 g/dL (ref 0.4–1.8)
Globulin, Total: 2.4 g/dL (ref 2.2–3.9)
IgA: 180 mg/dL (ref 61–437)
IgG (Immunoglobin G), Serum: 611 mg/dL (ref 603–1613)
IgM (Immunoglobulin M), Srm: 12 mg/dL — ABNORMAL LOW (ref 20–172)
Total Protein ELP: 5.9 g/dL — ABNORMAL LOW (ref 6.0–8.5)

## 2022-02-13 IMAGING — CT CT BIOPSY AND ASPIRATION BONE MARROW
1 of 3 series · 9 of 14 positions shown, 12 images · non-contrast
Comparison: none

INDICATION: multiple mylenoma

[Series 2: i-spiral 5.0 br38 · axial · 0.86mm/px · z∈[-176,-54]mm · 9 of 45 slices shown, 12 images]
[im 5/45  soft-tissue]
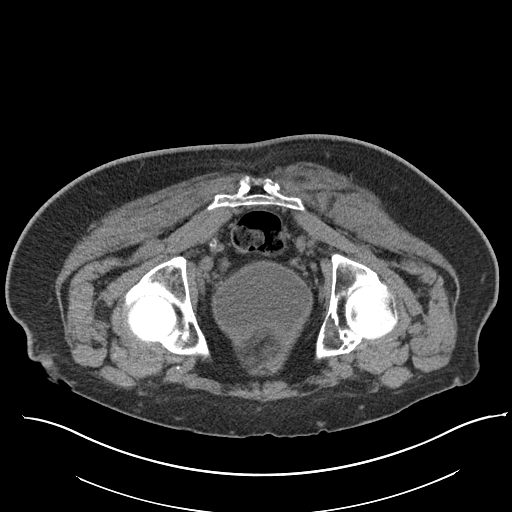
[im 5/45  bone]
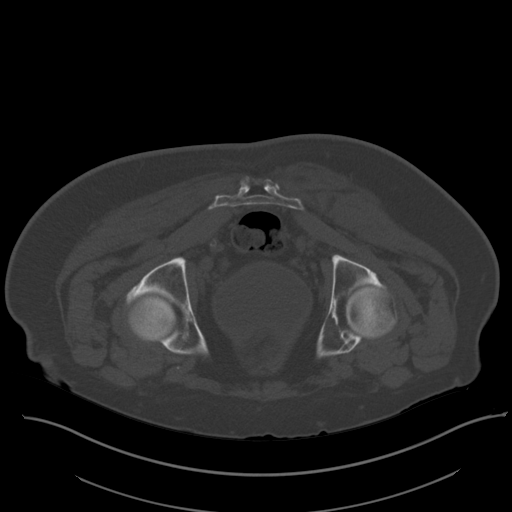
[im 9/45  bone]
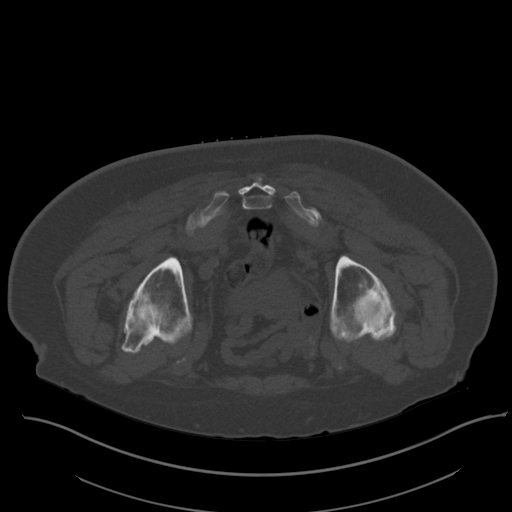
[im 14/45  bone]
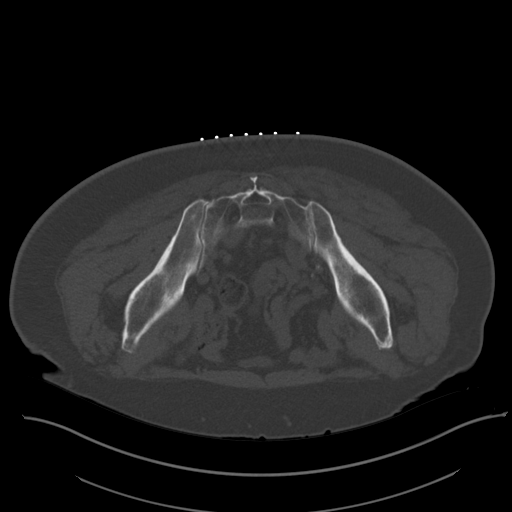
[im 18/45  bone]
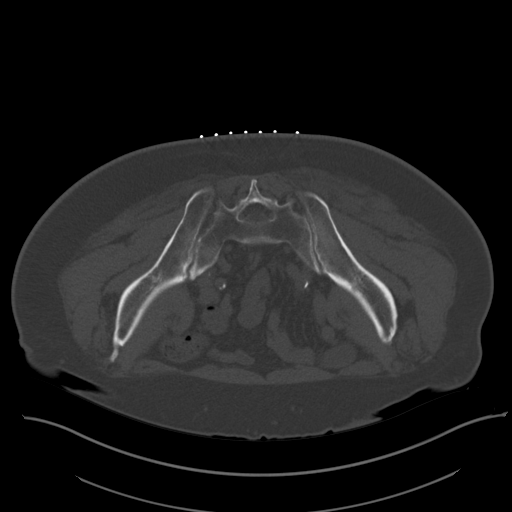
[im 23/45  soft-tissue]
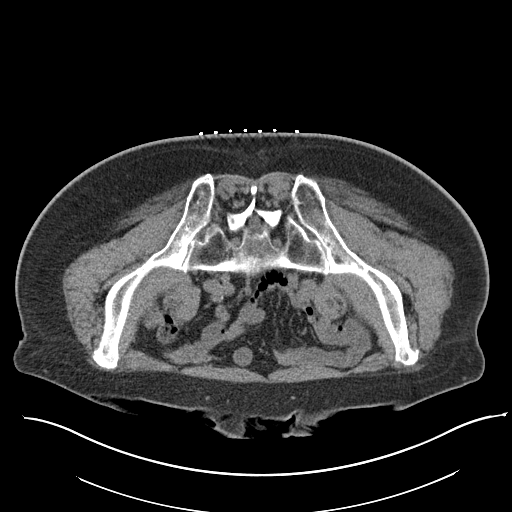
[im 23/45  bone]
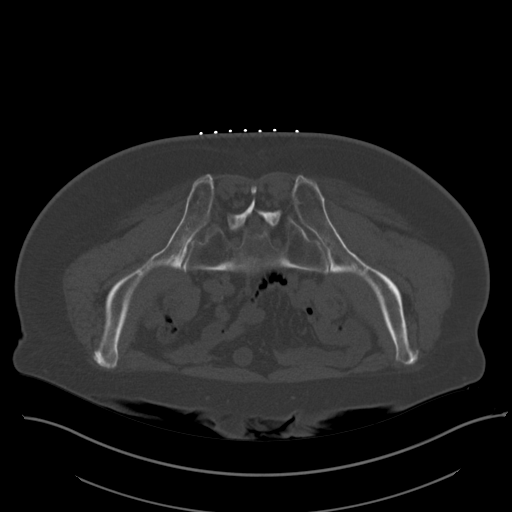
[im 27/45  bone]
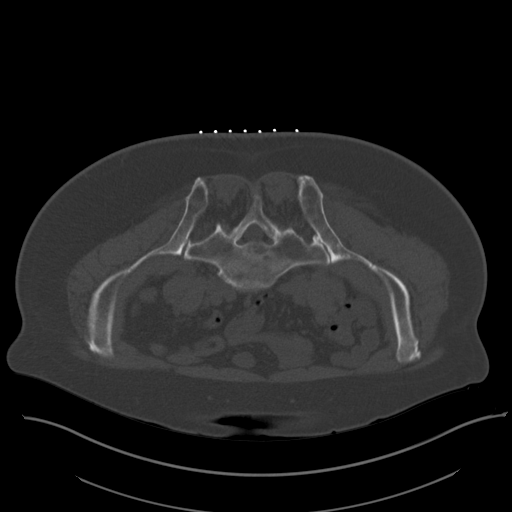
[im 31/45  bone]
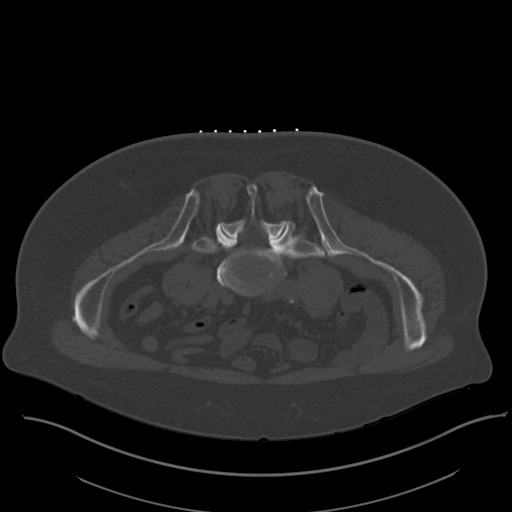
[im 36/45  bone]
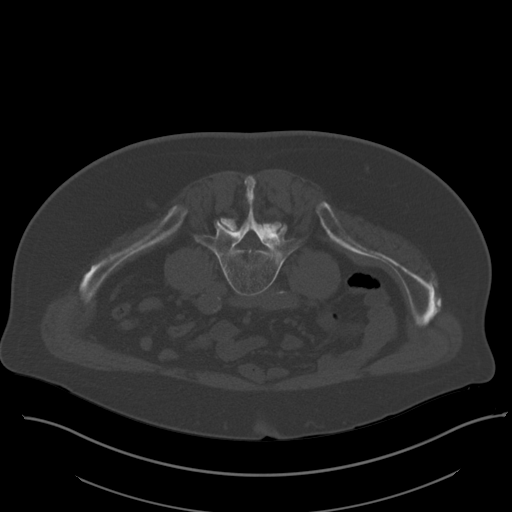
[im 40/45  soft-tissue]
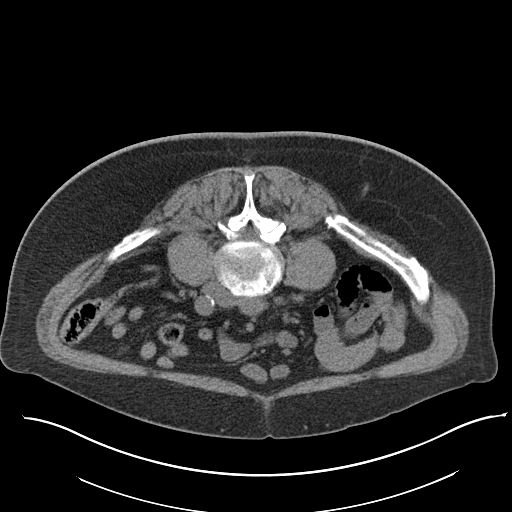
[im 40/45  bone]
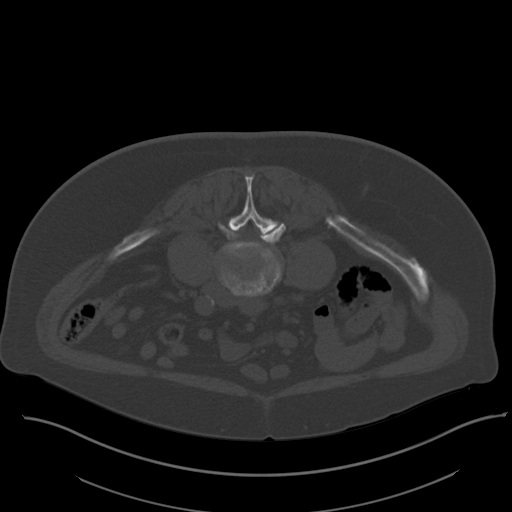

[9 of 14 positions shown; findings below may reference images not displayed]

EXAM:
CT BONE MARROW BIOPSY AND ASPIRATION

MEDICATIONS:
None.

ANESTHESIA/SEDATION:
Moderate (conscious) sedation was employed during this procedure. A
total of Versed 2 mg and Fentanyl 100 mcg was administered
intravenously.

Moderate Sedation Time: 13 minutes. The patient's level of
consciousness and vital signs were monitored continuously by
radiology nursing throughout the procedure under my direct
supervision.

FLUOROSCOPY TIME:  N/a

COMPLICATIONS:
None immediate.

PROCEDURE:
Informed written consent was obtained from the patient after a
thorough discussion of the procedural risks, benefits and
alternatives. All questions were addressed. Maximal Sterile Barrier
Technique was utilized including caps, mask, sterile gowns, sterile
gloves, sterile drape, hand hygiene and skin antiseptic. A timeout
was performed prior to the initiation of the procedure.

The patient was placed prone on the CT exam table. Limited CT of the
pelvis was performed for planning purposes. Skin entry site was
marked, and the overlying skin was prepped and draped in the
standard sterile fashion. Local analgesia was obtained with 1%
lidocaine. Using CT guidance, an 11 gauge needle was advanced just
deep to the cortex of the right posterior ilium. Subsequently, bone
marrow aspiration and core biopsy were performed. A second core
biopsy was performed at the request of the pathology department.
Specimens were submitted to lab/pathology for handling. Hemostasis
was achieved with manual pressure, and a clean dressing was placed.
The patient tolerated the procedure well without immediate
complication.
IMPRESSION: Successful CT-guided bone marrow aspiration and core biopsy of the
right posterior ilium.

## 2022-02-14 ENCOUNTER — Other Ambulatory Visit: Payer: Medicare Other

## 2022-02-14 ENCOUNTER — Ambulatory Visit: Payer: Medicare Other

## 2022-02-14 ENCOUNTER — Ambulatory Visit: Payer: Medicare Other | Admitting: Oncology

## 2022-02-18 ENCOUNTER — Other Ambulatory Visit: Payer: Self-pay

## 2022-02-18 ENCOUNTER — Encounter: Payer: Self-pay | Admitting: Oncology

## 2022-02-18 DIAGNOSIS — C9000 Multiple myeloma not having achieved remission: Secondary | ICD-10-CM

## 2022-02-18 MED ORDER — LENALIDOMIDE 10 MG PO CAPS: 10.0000 mg | ORAL_CAPSULE | Freq: Every day | ORAL | 0 refills | Status: DC

## 2022-02-25 ENCOUNTER — Inpatient Hospital Stay: Payer: Medicare Other

## 2022-02-25 ENCOUNTER — Inpatient Hospital Stay: Payer: Medicare Other | Attending: Oncology

## 2022-02-25 ENCOUNTER — Encounter: Payer: Self-pay | Admitting: Oncology

## 2022-02-25 ENCOUNTER — Inpatient Hospital Stay (HOSPITAL_BASED_OUTPATIENT_CLINIC_OR_DEPARTMENT_OTHER): Payer: Medicare Other | Admitting: Oncology

## 2022-02-25 VITALS — BP 129/94 | HR 63 | Temp 97.8°F | Resp 18 | Wt 220.0 lb

## 2022-02-25 DIAGNOSIS — G620 Drug-induced polyneuropathy: Secondary | ICD-10-CM | POA: Diagnosis not present

## 2022-02-25 DIAGNOSIS — E785 Hyperlipidemia, unspecified: Secondary | ICD-10-CM | POA: Insufficient documentation

## 2022-02-25 DIAGNOSIS — Z5111 Encounter for antineoplastic chemotherapy: Secondary | ICD-10-CM | POA: Diagnosis not present

## 2022-02-25 DIAGNOSIS — E876 Hypokalemia: Secondary | ICD-10-CM | POA: Insufficient documentation

## 2022-02-25 DIAGNOSIS — C9000 Multiple myeloma not having achieved remission: Secondary | ICD-10-CM | POA: Insufficient documentation

## 2022-02-25 DIAGNOSIS — I82513 Chronic embolism and thrombosis of femoral vein, bilateral: Secondary | ICD-10-CM | POA: Diagnosis not present

## 2022-02-25 DIAGNOSIS — M79651 Pain in right thigh: Secondary | ICD-10-CM | POA: Diagnosis not present

## 2022-02-25 DIAGNOSIS — Z79899 Other long term (current) drug therapy: Secondary | ICD-10-CM | POA: Insufficient documentation

## 2022-02-25 DIAGNOSIS — I10 Essential (primary) hypertension: Secondary | ICD-10-CM | POA: Insufficient documentation

## 2022-02-25 DIAGNOSIS — T451X5A Adverse effect of antineoplastic and immunosuppressive drugs, initial encounter: Secondary | ICD-10-CM | POA: Diagnosis not present

## 2022-02-25 DIAGNOSIS — Z23 Encounter for immunization: Secondary | ICD-10-CM | POA: Diagnosis not present

## 2022-02-25 DIAGNOSIS — Z7961 Long term (current) use of immunomodulator: Secondary | ICD-10-CM | POA: Diagnosis not present

## 2022-02-25 DIAGNOSIS — F17210 Nicotine dependence, cigarettes, uncomplicated: Secondary | ICD-10-CM | POA: Diagnosis not present

## 2022-02-25 DIAGNOSIS — Z9481 Bone marrow transplant status: Secondary | ICD-10-CM

## 2022-02-25 DIAGNOSIS — Z7901 Long term (current) use of anticoagulants: Secondary | ICD-10-CM | POA: Diagnosis not present

## 2022-02-25 DIAGNOSIS — I825Y1 Chronic embolism and thrombosis of unspecified deep veins of right proximal lower extremity: Secondary | ICD-10-CM

## 2022-02-25 LAB — COMPREHENSIVE METABOLIC PANEL
ALT: 21 U/L (ref 0–44)
AST: 18 U/L (ref 15–41)
Albumin: 4 g/dL (ref 3.5–5.0)
Alkaline Phosphatase: 49 U/L (ref 38–126)
Anion gap: 6 (ref 5–15)
BUN: 9 mg/dL (ref 8–23)
CO2: 25 mmol/L (ref 22–32)
Calcium: 9.2 mg/dL (ref 8.9–10.3)
Chloride: 104 mmol/L (ref 98–111)
Creatinine, Ser: 1.04 mg/dL (ref 0.61–1.24)
GFR, Estimated: >60 mL/min (ref >60)
Glucose, Bld: 93 mg/dL (ref 70–99)
Potassium: 4.1 mmol/L (ref 3.5–5.1)
Sodium: 135 mmol/L (ref 135–145)
Total Bilirubin: 0.9 mg/dL (ref 0.3–1.2)
Total Protein: 6.8 g/dL (ref 6.5–8.1)

## 2022-02-25 LAB — CBC WITH DIFFERENTIAL/PLATELET
Abs Immature Granulocytes: 0.01 10*3/uL (ref 0.00–0.07)
Basophils Absolute: 0 10*3/uL (ref 0.0–0.1)
Basophils Relative: 1 %
Eosinophils Absolute: 0.2 10*3/uL (ref 0.0–0.5)
Eosinophils Relative: 6 %
HCT: 44.7 % (ref 39.0–52.0)
Hemoglobin: 14.5 g/dL (ref 13.0–17.0)
Immature Granulocytes: 0 %
Lymphocytes Relative: 48 %
Lymphs Abs: 1.5 10*3/uL (ref 0.7–4.0)
MCH: 29.5 pg (ref 26.0–34.0)
MCHC: 32.4 g/dL (ref 30.0–36.0)
MCV: 90.9 fL (ref 80.0–100.0)
Monocytes Absolute: 0.3 10*3/uL (ref 0.1–1.0)
Monocytes Relative: 8 %
Neutro Abs: 1.1 10*3/uL — ABNORMAL LOW (ref 1.7–7.7)
Neutrophils Relative %: 37 %
Platelets: 138 10*3/uL — ABNORMAL LOW (ref 150–400)
RBC: 4.92 MIL/uL (ref 4.22–5.81)
RDW: 18.2 % — ABNORMAL HIGH (ref 11.5–15.5)
WBC: 3.1 10*3/uL — ABNORMAL LOW (ref 4.0–10.5)
nRBC: 0 % (ref 0.0–0.2)

## 2022-02-25 MED ORDER — INFLUENZA VAC A&B SA ADJ QUAD 0.5 ML IM PRSY
0.5000 mL | PREFILLED_SYRINGE | Freq: Once | INTRAMUSCULAR | Status: AC
  Administered 2022-02-25: 0.5 mL via INTRAMUSCULAR
  Filled 2022-02-25: qty 0.5

## 2022-02-25 MED ORDER — APIXABAN 5 MG PO TABS: 5.0000 mg | ORAL_TABLET | Freq: Two times a day (BID) | ORAL | 2 refills | Status: DC

## 2022-02-25 NOTE — Assessment & Plan Note (Signed)
Recommend potassium chloride 20 mEq daily.

## 2022-02-25 NOTE — Assessment & Plan Note (Signed)
Continue anticoagulation with Eliquis '5mg'$  BID

## 2022-02-25 NOTE — Progress Notes (Signed)
Pt here for follow up. No new concerns voiced.   

## 2022-02-25 NOTE — Progress Notes (Signed)
Hematology/Oncology Progress note [phone] [phone]      Patient Care Team: Tommy Hartigan, MD as PCP - General (Family Medicine) Tommy Filbert, MD as Radiation Oncologist (Radiation Oncology)   ASSESSMENT & PLAN:   Cancer Staging  Multiple myeloma not having achieved remission [facility]) Staging form: Plasma Cell Myeloma and Plasma Cell Disorders, AJCC 8th Edition - Clinical stage from 02/04/2020: Beta-2-microglobulin (mg/L): 2.1, Albumin (g/dL): 2.7, ISS: Stage II, High-risk cytogenetics: Absent, LDH: Unknown - Signed by Tommy Server, MD on 05/06/2020   Multiple myeloma not having achieved remission ([place]) #IgG multiple myeloma, stage II, del 1 p, high risk.  Declined bone marrow transplant evaluation. 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 Labs are reviewed and discussed with patient. Recommend Revlimid 10 mg p.o. 3 weeks on 1 week off- start when he gets supply continue Zometa increase interval to every 3 months..  Recommend patient to continue calcium and vitamin D supplementation   Encounter for antineoplastic chemotherapy Treatment as planned.  Hypokalemia Recommend potassium chloride 20 mEq daily.    History of bone marrow transplant [facility]) Patient follows up with oncology for post marrow transplant vaccination. Continue acyclovir 400 mg twice daily for 1 year post transplant -he gets Rx from [facility]  Chemotherapy-induced neuropathy (HCC) Grade 1, stable symptoms.  Observation  Chronic deep vein thrombosis (DVT) (HCC) Continue anticoagulation with Eliquis 13m BID  Influenza vaccination today Orders Placed This Encounter  Procedures   CBC with  Differential/Platelet    Standing Status:   Future    Standing Expiration Date:   02/25/2023   Comprehensive metabolic panel    Standing Status:   Future    Standing Expiration Date:   02/25/2023   Kappa/lambda light chains    Standing Status:   Future    Standing Expiration Date:   02/25/2023   Multiple Myeloma Panel (SPEP&IFE w/QIG)    Standing Status:   Future    Standing Expiration Date:   02/25/2023    Lab MD  4 weeks   All questions were answered. The patient knows to call the clinic with any problems, questions or concerns.  ZEarlie Server MD, PhD [facility] Hematology Oncology 02/25/2022       CHIEF COMPLAINTS/REASON FOR VISIT:  Follow-up for multiple myeloma HISTORY OF PRESENTING ILLNESS:   Tommy Smith is a  66y.o.  male presents for follow-up of management of multiple myeloma Oncology history summary listed as below  Oncology History  [place] achieved [place]  01/29/2020 Imaging   CT showed multiple bilateral pulmonary nodules measuring up to 10 mm in the right middle lobe, nodules are indeterminate, infectious/inflammatory etiology versus metastatic disease. Multiple osseous lytic lesions, largest lesion involving the left pubic bone concerning for metastatic disease.Compression fractures at T9, age indeterminate. Appearance focal area of thickening at the gastroesophageal junction.  Further evaluation with upper GI study or direct visualization with ndoscopy is recommended No bowel obstruction.Aortic atherosclerosis.   02/04/2020 Cancer Staging   Staging form: Plasma Cell Myeloma and Plasma Cell Disorders, AJCC 8th Edition - Clinical stage from 02/04/2020: Beta-2-microglobulin (mg/L): 2.1, Albumin (g/dL): 2.7, ISS: Stage II, High-risk cytogenetics: Absent, LDH: Unknown - Signed by YEarlie Server MD on 05/06/2020   02/21/2020 Initial  Diagnosis   Multiple myeloma  Baseline M protein 4.1, kappa light chain level 333.8 Beta-2  microglobulin 2.1, albumin 2.7.  -02/12/2020, bone marrow biopsy showed hypercellular for age, 80% plasma cell which are complex restricted by light chain in situ heparinization.  Background hematopoiesis is present but reduced. Cytogenetics is normal, Myeloma FISH panel-deletion 1P,Standard risk.   02/27/2020 - 05/06/2021 Chemotherapy   VRd     09/01/2020 Bone Marrow Biopsy   Bone marrow results were reviewed.  1% plasma cell.  Normal cytogenetics.  Myeloma FISH negative for 1p del.   04/21/2021 Bone Marrow Biopsy   Bone marrow after 14 cycles of VRD -[January 2023 ]showed plasma cells 2%.-   06/25/2021 Bone Marrow Transplant   Patient underwent autologous bone marrow [facility] at [facility]   09/2021 -  Chemotherapy   Revlimid 10 mg 3 weeks on 1 week off   10/26/2021 Imaging     10/26/2021 Imaging   Bilateral lower extremity venous US Partially occlusive right deep vein thrombus extending from the calf veins into the common femoral vein with limited examination of the left extremity demonstrating partially occlusive thrombus in the common femoral vein.     01/13/2022 Imaging   Bilateral lower extremity venous US 1. 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 2. Extensive predominantly occlusive/near occlusive left lower extremity DVT extending from the left common femoral vein through the imaged tibial veins, age-indeterminate in the absence of prior examinations though echogenic thrombus within the left femoral vein suggests a chronic etiology.     01/20/2022 Bone Marrow Biopsy   -Bone marrow biopsy at [facility] showed Cellular bone marrow aspirate with trilineage hematopoiesis and no morphologic evidence of plasma cell neoplasm.   -Negative minimal residual disease.    #Focal area of thickening at the GE junction. 10/23/2020 EGD is normal.   #Lung nodules, measures up to 10 mm on the right middle lobe.  Indeterminate.  Attention on follow-up.  Recommend repeat CT chest and he declined  10/28/2011, unilateral right lower extremity ultrasound showed partially occlusive right deep vein thrombosis extending from the calf vein into the, femoral vein. Patient was recommended to start on Eliquis for anticoagulation.  He tolerates well.  Right thigh pain has improved.  INTERVAL HISTORY Tommy Smith. is a 66 y.o. male who has above history reviewed by me today presents for follow up visit for multiple myeloma Patient is currently on Revlimid 10 mg 3 weeks on 1 week off for maintenance..   Current everyday smoker, 10 cigarettes daily.   He follows  up with [facility] transplant team and was recently seen by Dr.Gaspereto.  Patient denies fever chills.  Review of Systems  Constitutional:  Negative for appetite change, chills, fatigue, fever and unexpected weight change.  HENT:   Negative for hearing loss and voice change.   Eyes:  Negative for eye problems and icterus.  Respiratory:  Negative for chest tightness, cough and shortness of breath.   Cardiovascular:  Negative for chest pain and leg swelling.  Gastrointestinal:  Negative for abdominal distention, abdominal pain and constipation.  Endocrine: Negative for hot flashes.  Genitourinary:  Negative for difficulty urinating, dysuria and frequency.   Musculoskeletal:  Negative for arthralgias, back pain and neck pain.  Skin:  Negative for itching and rash.  Neurological:  Positive  for numbness. Negative for light-headedness.  Hematological:  Negative for adenopathy. Does not bruise/bleed easily.  Psychiatric/Behavioral:  Negative for confusion.      MEDICAL HISTORY:  Past Medical History:  Diagnosis Date   Cancer ([place])    Depression    Hypercalcemia 03/02/2020    Hyperlipemia    Hypertension    Hypogonadism in male    Multiple myeloma [facility])     SURGICAL HISTORY: Past Surgical History:  Procedure Laterality Date   BONE MARROW BIOPSY     COLONOSCOPY  07/14/2008   ESOPHAGOGASTRODUODENOSCOPY N/A 10/22/2020   Procedure: ESOPHAGOGASTRODUODENOSCOPY (EGD);  Surgeon: Jonathon Bellows, MD;  Location: [facility] ENDOSCOPY;  Service: Gastroenterology;  Laterality: N/A;    SOCIAL HISTORY: Social History   Socioeconomic History   Marital status: Married    Spouse name: [name]   Number of children: 3   Years of education: Not on file   Highest education level: Not on file  Occupational History   Not on file  Tobacco Use   Smoking status: Every Day    Packs/day: 0.50    Years: 30.00    Total pack years: 15.00    Types: Cigarettes   Smokeless tobacco: Never  Substance and Sexual Activity   Alcohol use: Yes    Comment: occcassional   Drug use: No   Sexual activity: Not on file  Other Topics Concern   Not on file  Social History Narrative   Not on file   Social Determinants of Health   Financial Resource Strain: Not on file  Food Insecurity: Not on file  Transportation Needs: Not on file  Physical Activity: Not on file  Stress: Not on file  Social Connections: Not on file  Intimate Partner Violence: Not on file    FAMILY HISTORY: Family History  Problem Relation Age of Onset   Skin cancer Mother    Prostate cancer Father     ALLERGIES:  has No Known Allergies.  MEDICATIONS:  Current Outpatient Medications  Medication Sig Dispense Refill   acyclovir (ZOVIRAX) 400 MG tablet TAKE 1 TABLET BY MOUTH TWICE A DAY 180 tablet 1   apixaban (ELIQUIS) 5 MG TABS tablet Take 1 tablet (5 mg total) by mouth 2 (two) times daily. 60 tablet 2   buPROPion (WELLBUTRIN XL) 300 MG 24 hr tablet Take 300 mg by mouth daily.     calcium carbonate (OS-CAL) 600 MG tablet Take 3 tablets (1,800 mg total) by mouth daily. 30 tablet 0   cholecalciferol (VITAMIN D3) 25  MCG (1000 UNIT) tablet Take 2,000 Units by mouth daily.     lenalidomide (REVLIMID) 10 MG capsule Take 1 capsule (10 mg total) by mouth daily. Take for 21 days, then hold for 7 days. Repeat every 28 days. 21 capsule 0   potassium chloride SA (KLOR-CON M) 20 MEQ tablet Take 1 tablet (20 mEq total) by mouth daily. 30 tablet 0   sertraline (ZOLOFT) 25 MG tablet Take 1 tablet by mouth daily.     loperamide (IMODIUM) 2 MG capsule Take by mouth. (Patient not taking: Reported on 09/29/2021)     No current facility-administered medications for this visit.     PHYSICAL EXAMINATION: ECOG PERFORMANCE STATUS: 1 - Symptomatic but completely ambulatory Vitals:   02/25/22 1103  BP: (!) 129/94  Pulse: 63  Resp: 18  Temp: 97.8 F (36.6 C)   Filed Weights   02/25/22 1103  Weight: 220 lb (99.8 kg)    Physical Exam Constitutional:  General: He is not in acute distress.    Appearance: He is not diaphoretic.  HENT:     Head: Normocephalic and atraumatic.     Nose: Nose normal.     Mouth/Throat:     Pharynx: No oropharyngeal exudate.  Eyes:     General: No scleral icterus.    Pupils: Pupils are equal, round, and reactive to light.  Cardiovascular:     Rate and Rhythm: Normal rate and regular rhythm.     Heart sounds: No murmur heard. Pulmonary:     Effort: Pulmonary effort is normal. No respiratory distress.     Breath sounds: No rales.  Chest:     Chest wall: No tenderness.  Abdominal:     General: There is no distension.     Palpations: Abdomen is soft.     Tenderness: There is no abdominal tenderness.  Musculoskeletal:        General: Normal range of motion.     Cervical back: Normal range of motion and neck supple.  Skin:    General: Skin is warm and dry.     Findings: No erythema.  Neurological:     Mental Status: He is alert and oriented to person, place, and time.     Cranial Nerves: No cranial nerve deficit.     Motor: No abnormal muscle tone.     Coordination:  Coordination normal.  Psychiatric:        Mood and Affect: Affect normal.       LABORATORY DATA:  I have reviewed the data as listed    Latest Ref Rng & Units 02/25/2022   10:49 AM 01/27/2022   11:34 AM 12/30/2021   10:06 AM  CBC  WBC 4.0 - 10.5 K/uL 3.1  3.2  3.2   Hemoglobin 13.0 - 17.0 g/dL 14.5  14.1  14.2   Hematocrit 39.0 - 52.0 % 44.7  43.9  43.5   Platelets 150 - 400 K/uL 138  142  164       Latest Ref Rng & Units 02/25/2022   10:49 AM 01/27/2022   11:34 AM 12/30/2021   10:06 AM  CMP  Glucose 70 - 99 mg/dL 93  87  84   BUN 8 - 23 mg/dL _0 Creatinine 0.61 - 1.24 mg/dL 1.04  0.97  1.00   Sodium 135 - 145 mmol/L 135  137  138   Potassium 3.5 - 5.1 mmol/L 4.1  4.0  3.1   Chloride 98 - 111 mmol/L 104  105  107   CO2 22 - 32 mmol/L _1 Calcium 8.9 - 10.3 mg/dL 9.2  8.9  8.6   Total Protein 6.5 - 8.1 g/dL 6.8  7.0  6.7   Total Bilirubin 0.3 - 1.2 mg/dL 0.9  1.0  1.5   Alkaline Phos 38 - 126 U/L 49  52  54   AST 15 - 41 U/L _2 ALT 0 - 44 U/L _3 RADIOGRAPHIC STUDIES: I have personally reviewed the radiological images as listed and agreed with the findings in the report. US Venous Img Lower Bilateral (DVT)  Result Date: 01/13/2022 CLINICAL DATA:  History of lower extremity DVT, currently on anticoagulation. Evaluate for acute or chronic DVT. EXAM: BILATERAL LOWER EXTREMITY VENOUS DOPPLER ULTRASOUND TECHNIQUE: Gray-scale sonography with graded compression, as well as color Doppler and duplex ultrasound were  performed to evaluate the lower extremity deep venous systems from the level of the common femoral vein and including the common femoral, femoral, profunda femoral, popliteal and calf veins including the posterior tibial, peroneal and gastrocnemius veins when visible. The superficial great saphenous vein was also interrogated. Spectral Doppler was utilized to evaluate flow at rest and with distal augmentation maneuvers in the common  femoral, femoral and popliteal veins. COMPARISON:  Right lower extremity venous Doppler ultrasound (positive for DVT extending from the right common femoral vein through the right tibial veins as well as DVT involving the left common femoral vein). FINDINGS: RIGHT LOWER EXTREMITY There is hypoechoic near occlusive DVT involving the right common femoral vein (image 7 and 8). The saphenofemoral junction appears patent (image 13), improved compared to the 10/2021 examination. There is hypoechoic near occlusive thrombus involving the imaged portions of the right deep femoral vein (image 16), similar to the 10/2021 examination. There is hypoechoic occlusive thrombus involving the proximal (image 20), mid (image 24) and distal (image 28) aspects of the right femoral vein, similar to the 10/2021 examination. There is mixed echogenic occlusive thrombus involving the right popliteal vein (images 31 and 32), unchanged to slightly worsened compared to the 10/2021 examination, extending to involve both paired divisions of the right posterior tibial vein (image 40) as well as both paired divisions of the right peroneal vein (image 43). Other Findings:  None. LEFT LOWER EXTREMITY There is mixed echogenic near occlusive thrombus involving the distal aspect of the left common femoral vein (image 5), extending to involve the saphenofemoral junction (image 8), similar to the 10/2021 examination. The left deep femoral vein appears patent (image 11). There is echogenic chronic occlusive thrombus involving the proximal (image 15), mid (image 19) and distal (image 23) aspects of the left femoral vein. There is mixed echogenic occlusive thrombus involving the left popliteal vein (image 28 and 32), extending to involve both paired divisions of the left posterior tibial vein (images 35 and 36) as well as the left peroneal vein (image 39). Other Findings:  None. IMPRESSION: 1. Extensive chronic predominantly occlusive/near occlusive right  lower extremity DVT extending from the right common femoral vein through the imaged tibial veins, similar to slightly worsened compared to the 10/2021 examination with suspected worsening of occlusive thrombus within the right popliteal vein. 2. Extensive predominantly occlusive/near occlusive left lower extremity DVT extending from the left common femoral vein through the imaged tibial veins, age-indeterminate in the absence of prior examinations though echogenic thrombus within the left femoral vein suggests a chronic etiology. Electronically Signed   By: Sandi Mariscal M.D.   On: 01/13/2022 11:53

## 2022-02-25 NOTE — Assessment & Plan Note (Signed)
Treatment as planned. 

## 2022-02-25 NOTE — Assessment & Plan Note (Signed)
Patient follows up with oncology for post marrow transplant vaccination. Continue acyclovir 400 mg twice daily for 1 year post transplant -he gets Rx from [facility] 

## 2022-02-25 NOTE — Assessment & Plan Note (Signed)
Grade 1, stable symptoms.  Observation 

## 2022-02-25 NOTE — Assessment & Plan Note (Addendum)
#  IgG multiple myeloma, stage II, del 1 p, high risk.  Declined bone marrow transplant evaluation. 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 Labs are reviewed and discussed with patient. Recommend Revlimid 10 mg p.o. 3 weeks on 1 week off- start when he gets supply continue Zometa increase interval to every 3 months..  Recommend patient to continue calcium and vitamin D supplementation

## 2022-02-28 LAB — KAPPA/LAMBDA LIGHT CHAINS
Kappa free light chain: 13.8 mg/L (ref 3.3–19.4)
Kappa, lambda light chain ratio: 1.55 (ref 0.26–1.65)
Lambda free light chains: 8.9 mg/L (ref 5.7–26.3)

## 2022-02-28 LAB — MULTIPLE MYELOMA PANEL, SERUM
Albumin SerPl Elph-Mcnc: 3.8 g/dL (ref 2.9–4.4)
Albumin/Glob SerPl: 1.8 — ABNORMAL HIGH (ref 0.7–1.7)
Alpha 1: 0.2 g/dL (ref 0.0–0.4)
Alpha2 Glob SerPl Elph-Mcnc: 0.6 g/dL (ref 0.4–1.0)
B-Globulin SerPl Elph-Mcnc: 0.9 g/dL (ref 0.7–1.3)
Gamma Glob SerPl Elph-Mcnc: 0.5 g/dL (ref 0.4–1.8)
Globulin, Total: 2.2 g/dL (ref 2.2–3.9)
IgA: 199 mg/dL (ref 61–437)
IgG (Immunoglobin G), Serum: 613 mg/dL (ref 603–1613)
IgM (Immunoglobulin M), Srm: 10 mg/dL — ABNORMAL LOW (ref 20–172)
Total Protein ELP: 6 g/dL (ref 6.0–8.5)

## 2022-03-07 ENCOUNTER — Ambulatory Visit (INDEPENDENT_AMBULATORY_CARE_PROVIDER_SITE_OTHER): Payer: Medicare Other | Admitting: Nurse Practitioner

## 2022-03-07 ENCOUNTER — Encounter (INDEPENDENT_AMBULATORY_CARE_PROVIDER_SITE_OTHER): Payer: Self-pay | Admitting: Nurse Practitioner

## 2022-03-07 VITALS — BP 114/75 | HR 69 | Resp 16 | Wt 225.6 lb

## 2022-03-07 DIAGNOSIS — I10 Essential (primary) hypertension: Secondary | ICD-10-CM

## 2022-03-07 DIAGNOSIS — I825Y1 Chronic embolism and thrombosis of unspecified deep veins of right proximal lower extremity: Secondary | ICD-10-CM

## 2022-03-07 DIAGNOSIS — F17200 Nicotine dependence, unspecified, uncomplicated: Secondary | ICD-10-CM | POA: Diagnosis not present

## 2022-03-14 ENCOUNTER — Encounter (INDEPENDENT_AMBULATORY_CARE_PROVIDER_SITE_OTHER): Payer: Self-pay | Admitting: Nurse Practitioner

## 2022-03-14 NOTE — Progress Notes (Signed)
Subjective:    Patient ID: Tommy Smith., male    DOB: 06-16-1955, 66 y.o.   MRN: [id number] Chief Complaint  Patient presents with   New Patient (Initial Visit)    Ref Tasia Catchings consult chronic bil dvt    Tommy Smith is a 66 year old male who is referred by Dr. Tasia Catchings for chronic deep vein thrombosis.  The patient was initially diagnosed with a right lower extremity DVT in July.  At that time it was partially occlusive that extended from the calf to the common femoral veins.  The patient had a repeat duplex which showed concern for possible worsening thrombus in the right lower extremity with evidence of thrombus in the left lower extremity as well.  This is the first ultrasound of the left lower extremity but it notes that this is likely a chronic thrombus based on the echogenicity.  The patient currently denies any pain or swelling of his lower extremities.  He is currently on Eliquis he denies any significant issues with Eliquis.    Review of Systems  Cardiovascular:  Negative for leg swelling.  All other systems reviewed and are negative.      Objective:   Physical Exam Vitals reviewed.  HENT:     Head: Normocephalic.  Cardiovascular:     Rate and Rhythm: Normal rate.     Pulses: Normal pulses.  Pulmonary:     Effort: Pulmonary effort is normal.  Musculoskeletal:     Right lower leg: No edema.     Left lower leg: No edema.  Skin:    General: Skin is warm and dry.  Neurological:     Mental Status: He is alert and oriented to person, place, and time.  Psychiatric:        Mood and Affect: Mood normal.        Behavior: Behavior normal.        Thought Content: Thought content normal.        Judgment: Judgment normal.     BP 114/75 (BP Location: Left Arm)   Pulse 69   Resp 16   Wt 225 lb 9.6 oz (102.3 kg)   BMI 28.20 kg/m   Past Medical History:  Diagnosis Date   Cancer ([place])    Depression    Hypercalcemia 03/02/2020   Hyperlipemia    Hypertension     Hypogonadism in male    Multiple myeloma ([place])     Social History   Socioeconomic History   Marital status: Married    Spouse name: [name]   Number of children: 3   Years of education: Not on file   Highest education level: Not on file  Occupational History   Not on file  Tobacco Use   Smoking status: Every Day    Packs/day: 0.50    Years: 30.00    Total pack years: 15.00    Types: Cigarettes   Smokeless tobacco: Never  Substance and Sexual Activity   Alcohol use: Yes    Comment: occcassional   Drug use: No   Sexual activity: Not on file  Other Topics Concern   Not on file  Social History Narrative   Not on file   Social Determinants of Health   Financial Resource Strain: Not on file  Food Insecurity: Not on file  Transportation Needs: Not on file  Physical Activity: Not on file  Stress: Not on file  Social Connections: Not on file  Intimate Partner Violence: Not on file    Past  Surgical History:  Procedure Laterality Date   BONE MARROW BIOPSY     COLONOSCOPY  07/14/2008   ESOPHAGOGASTRODUODENOSCOPY N/A 10/22/2020   Procedure: ESOPHAGOGASTRODUODENOSCOPY (EGD);  Surgeon: Jonathon Bellows, MD;  Location: [facility] ENDOSCOPY;  Service: Gastroenterology;  Laterality: N/A;    Family History  Problem Relation Age of Onset   Skin cancer Mother    Prostate cancer Father     No Known Allergies     Latest Ref Rng & Units 02/25/2022   10:49 AM 01/27/2022   11:34 AM 12/30/2021   10:06 AM  CBC  WBC 4.0 - 10.5 K/uL 3.1  3.2  3.2   Hemoglobin 13.0 - 17.0 g/dL 14.5  14.1  14.2   Hematocrit 39.0 - 52.0 % 44.7  43.9  43.5   Platelets 150 - 400 K/uL 138  142  164       CMP     Component Value Date/Time   NA 135 02/25/2022 1049   K 4.1 02/25/2022 1049   CL 104 02/25/2022 1049   CO2 25 02/25/2022 1049   GLUCOSE 93 02/25/2022 1049   BUN 9 02/25/2022 1049   CREATININE 1.04 02/25/2022 1049   CALCIUM 9.2 02/25/2022 1049   PROT 6.8 02/25/2022 1049   ALBUMIN 4.0  02/25/2022 1049   AST 18 02/25/2022 1049   ALT 21 02/25/2022 1049   ALKPHOS 49 02/25/2022 1049   BILITOT 0.9 02/25/2022 1049   GFRNONAA >60 02/25/2022 1049   GFRAA >60 09/06/2016 1027     No results found.     Assessment & Plan:   1. Chronic deep vein thrombosis (DVT) of proximal vein of right lower extremity (HCC) I had a discussion with the patient in regards to treatment deep vein thrombosis especially in the setting of acute versus chronic.  Typically the optimal timeframe for intervention in acute DVT is 2 weeks.  There are some instances 4 weeks post diagnosis that can be helpful however in this instance the patient's thrombus was already chronic at diagnosis and it has been well over 6 weeks since diagnosis.  Because the patient does not have any significant symptoms, intervention would not likely be greatly helpful.  In this instance continuation with anticoagulation is recommended.  We discussed postphlebitic symptoms such as lower extremity edema.  As such, the patient has been advised to utilize medical grade compression in cases of edema. 2. Benign essential hypertension Continue antihypertensive medications as already ordered, these medications have been reviewed and there are no changes at this time.  3. Current every day smoker Smoking cessation is encouraged as this does increase hypercoagulability.   Current Outpatient Medications on File Prior to Visit  Medication Sig Dispense Refill   acyclovir (ZOVIRAX) 400 MG tablet TAKE 1 TABLET BY MOUTH TWICE A DAY 180 tablet 1   apixaban (ELIQUIS) 5 MG TABS tablet Take 1 tablet (5 mg total) by mouth 2 (two) times daily. 60 tablet 2   buPROPion (WELLBUTRIN XL) 300 MG 24 hr tablet Take 300 mg by mouth daily.     calcium carbonate (OS-CAL) 600 MG tablet Take 3 tablets (1,800 mg total) by mouth daily. 30 tablet 0   cholecalciferol (VITAMIN D3) 25 MCG (1000 UNIT) tablet Take 2,000 Units by mouth daily.     lenalidomide (REVLIMID) 10  MG capsule Take 1 capsule (10 mg total) by mouth daily. Take for 21 days, then hold for 7 days. Repeat every 28 days. 21 capsule 0   potassium chloride SA (KLOR-CON M) 20  MEQ tablet Take 1 tablet (20 mEq total) by mouth daily. 30 tablet 0   sertraline (ZOLOFT) 25 MG tablet Take 1 tablet by mouth daily.     loperamide (IMODIUM) 2 MG capsule Take by mouth. (Patient not taking: Reported on 09/29/2021)     No current facility-administered medications on file prior to visit.    There are no Patient Instructions on file for this visit. No follow-ups on file.   Kris Hartmann, NP

## 2022-03-17 ENCOUNTER — Other Ambulatory Visit: Payer: Self-pay

## 2022-03-17 DIAGNOSIS — C9000 Multiple myeloma not having achieved remission: Secondary | ICD-10-CM

## 2022-03-17 MED ORDER — LENALIDOMIDE 10 MG PO CAPS: 10.0000 mg | ORAL_CAPSULE | Freq: Every day | ORAL | 0 refills | Status: DC

## 2022-03-23 ENCOUNTER — Other Ambulatory Visit: Payer: Medicare Other

## 2022-03-23 ENCOUNTER — Ambulatory Visit: Payer: Medicare Other | Admitting: Oncology

## 2022-03-29 ENCOUNTER — Encounter: Payer: Self-pay | Admitting: Oncology

## 2022-03-29 ENCOUNTER — Inpatient Hospital Stay: Payer: Medicare Other | Attending: Oncology

## 2022-03-29 ENCOUNTER — Inpatient Hospital Stay (HOSPITAL_BASED_OUTPATIENT_CLINIC_OR_DEPARTMENT_OTHER): Payer: Medicare Other | Admitting: Oncology

## 2022-03-29 VITALS — BP 124/88 | HR 77 | Temp 99.1°F | Resp 18 | Wt 224.8 lb

## 2022-03-29 DIAGNOSIS — D702 Other drug-induced agranulocytosis: Secondary | ICD-10-CM | POA: Diagnosis not present

## 2022-03-29 DIAGNOSIS — F17210 Nicotine dependence, cigarettes, uncomplicated: Secondary | ICD-10-CM | POA: Diagnosis not present

## 2022-03-29 DIAGNOSIS — Z9481 Bone marrow transplant status: Secondary | ICD-10-CM | POA: Diagnosis not present

## 2022-03-29 DIAGNOSIS — I10 Essential (primary) hypertension: Secondary | ICD-10-CM | POA: Insufficient documentation

## 2022-03-29 DIAGNOSIS — C9000 Multiple myeloma not having achieved remission: Secondary | ICD-10-CM

## 2022-03-29 DIAGNOSIS — G620 Drug-induced polyneuropathy: Secondary | ICD-10-CM

## 2022-03-29 DIAGNOSIS — Z5111 Encounter for antineoplastic chemotherapy: Secondary | ICD-10-CM

## 2022-03-29 DIAGNOSIS — E876 Hypokalemia: Secondary | ICD-10-CM | POA: Diagnosis not present

## 2022-03-29 DIAGNOSIS — I825Y1 Chronic embolism and thrombosis of unspecified deep veins of right proximal lower extremity: Secondary | ICD-10-CM

## 2022-03-29 DIAGNOSIS — T451X5A Adverse effect of antineoplastic and immunosuppressive drugs, initial encounter: Secondary | ICD-10-CM

## 2022-03-29 DIAGNOSIS — Z7901 Long term (current) use of anticoagulants: Secondary | ICD-10-CM | POA: Insufficient documentation

## 2022-03-29 DIAGNOSIS — Z808 Family history of malignant neoplasm of other organs or systems: Secondary | ICD-10-CM | POA: Insufficient documentation

## 2022-03-29 DIAGNOSIS — I82513 Chronic embolism and thrombosis of femoral vein, bilateral: Secondary | ICD-10-CM | POA: Diagnosis not present

## 2022-03-29 DIAGNOSIS — Z8042 Family history of malignant neoplasm of prostate: Secondary | ICD-10-CM | POA: Diagnosis not present

## 2022-03-29 LAB — CBC WITH DIFFERENTIAL/PLATELET
Abs Immature Granulocytes: 0.01 10*3/uL (ref 0.00–0.07)
Basophils Absolute: 0 10*3/uL (ref 0.0–0.1)
Basophils Relative: 1 %
Eosinophils Absolute: 0.3 10*3/uL (ref 0.0–0.5)
Eosinophils Relative: 10 %
HCT: 42.7 % (ref 39.0–52.0)
Hemoglobin: 14 g/dL (ref 13.0–17.0)
Immature Granulocytes: 0 %
Lymphocytes Relative: 44 %
Lymphs Abs: 1.2 10*3/uL (ref 0.7–4.0)
MCH: 30 pg (ref 26.0–34.0)
MCHC: 32.8 g/dL (ref 30.0–36.0)
MCV: 91.6 fL (ref 80.0–100.0)
Monocytes Absolute: 0.3 10*3/uL (ref 0.1–1.0)
Monocytes Relative: 11 %
Neutro Abs: 0.9 10*3/uL — ABNORMAL LOW (ref 1.7–7.7)
Neutrophils Relative %: 34 %
Platelets: 149 10*3/uL — ABNORMAL LOW (ref 150–400)
RBC: 4.66 MIL/uL (ref 4.22–5.81)
RDW: 18.5 % — ABNORMAL HIGH (ref 11.5–15.5)
WBC: 2.7 10*3/uL — ABNORMAL LOW (ref 4.0–10.5)
nRBC: 0 % (ref 0.0–0.2)

## 2022-03-29 LAB — COMPREHENSIVE METABOLIC PANEL
ALT: 20 U/L (ref 0–44)
AST: 18 U/L (ref 15–41)
Albumin: 4.2 g/dL (ref 3.5–5.0)
Alkaline Phosphatase: 47 U/L (ref 38–126)
Anion gap: 7 (ref 5–15)
BUN: 16 mg/dL (ref 8–23)
CO2: 26 mmol/L (ref 22–32)
Calcium: 9.2 mg/dL (ref 8.9–10.3)
Chloride: 106 mmol/L (ref 98–111)
Creatinine, Ser: 1.13 mg/dL (ref 0.61–1.24)
GFR, Estimated: >60 mL/min (ref >60)
Glucose, Bld: 81 mg/dL (ref 70–99)
Potassium: 3.7 mmol/L (ref 3.5–5.1)
Sodium: 139 mmol/L (ref 135–145)
Total Bilirubin: 0.7 mg/dL (ref 0.3–1.2)
Total Protein: 7 g/dL (ref 6.5–8.1)

## 2022-03-29 NOTE — Assessment & Plan Note (Addendum)
#  IgG multiple myeloma, stage II, del 1 p, high risk.  Declined bone marrow transplant evaluation. 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 Labs are reviewed and discussed with patient. Recommend Revlimid 10 mg p.o. 3 weeks on 1 week off- start when he gets supply continue Zometa increase interval to every 3 months..  Recommend patient to continue calcium and vitamin D supplementation

## 2022-03-29 NOTE — Assessment & Plan Note (Signed)
Grade 1, stable symptoms.  Observation 

## 2022-03-29 NOTE — Progress Notes (Unsigned)
Pt here for follow up. No new concerns voiced.   

## 2022-03-30 ENCOUNTER — Encounter: Payer: Self-pay | Admitting: Oncology

## 2022-03-30 LAB — KAPPA/LAMBDA LIGHT CHAINS
Kappa free light chain: 14.6 mg/L (ref 3.3–19.4)
Kappa, lambda light chain ratio: 1.47 (ref 0.26–1.65)
Lambda free light chains: 9.9 mg/L (ref 5.7–26.3)

## 2022-03-30 NOTE — Assessment & Plan Note (Addendum)
Continue anticoagulation with Eliquis 5mg BID Vascular surgeon recommends no intervention due to lack of significant symptoms.   

## 2022-03-30 NOTE — Assessment & Plan Note (Signed)
Treatment as planned. 

## 2022-03-30 NOTE — Assessment & Plan Note (Signed)
Improved. Will hold off potassium supplementation. Monitor counts

## 2022-03-30 NOTE — Assessment & Plan Note (Signed)
ANC 0.9.  Continue monitor.   Hold chemo

## 2022-03-30 NOTE — Assessment & Plan Note (Deleted)
Recommend potassium chloride 20 mEq daily.

## 2022-03-30 NOTE — Assessment & Plan Note (Signed)
Patient follows up with oncology for post marrow transplant vaccination. Continue acyclovir 400 mg twice daily for 1 year post transplant -he gets Rx from [facility] 

## 2022-03-30 NOTE — Progress Notes (Signed)
Hematology/Oncology Progress note [phone] [phone]      Patient Care Team: Tommy Hartigan, MD as PCP - General (Family Medicine) Tommy Filbert, MD as Radiation Oncologist (Radiation Oncology)   ASSESSMENT & PLAN:   Cancer Staging  Multiple myeloma not having achieved remission [facility]) Staging form: Plasma Cell Myeloma and Plasma Cell Disorders, AJCC 8th Edition - Clinical stage from 02/04/2020: Beta-2-microglobulin (mg/L): 2.1, Albumin (g/dL): 2.7, ISS: Stage II, High-risk cytogenetics: Absent, LDH: Unknown - Signed by Tommy Server, MD on 05/06/2020   Multiple myeloma not having achieved remission ([place]) #IgG multiple myeloma, stage II, del 1 p, high risk.  Declined bone marrow transplant evaluation. 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 Labs are reviewed and discussed with patient. He has started  Revlimid on 03/25/22. ANC is low 0.9 today.  I ask patient to hold off, repeat cbc in 1 week, if improved, can resume.      Chemotherapy-induced neuropathy (HCC) Grade 1, stable symptoms.  Observation  Chronic deep vein thrombosis (DVT) (HCC) Continue anticoagulation with Eliquis 47m BID Vascular surgeon recommends no intervention due to lack of significant symptoms.    Drug-induced neutropenia (HCC) ANC 0.9.  Continue monitor.   Hold chemo  Encounter for antineoplastic chemotherapy Treatment as planned.  History of bone marrow transplant ([facility] Patient follows up with oncology for post marrow transplant vaccination. Continue acyclovir 400 mg twice daily for 1 year post transplant -he gets Rx from [facility]  Hypokalemia Improved. Will hold off potassium  supplementation. Monitor counts  Influenza vaccination today Orders Placed This Encounter  Procedures   CBC with Differential/Platelet    Standing Status:   Future    Standing Expiration Date:   03/30/2023    Lab MD  4 weeks   All questions were answered. The patient knows to call the clinic with any problems, questions or concerns.  ZEarlie Server MD, PhD [facility] Oncology 03/29/2022       CHIEF COMPLAINTS/REASON FOR VISIT:  Follow-up for multiple myeloma HISTORY OF PRESENTING ILLNESS:   Tommy Smith is a  66y.o.  male presents for follow-up of management of multiple myeloma Oncology history summary listed as below  Oncology History  [place]  01/29/2020 Imaging   CT showed multiple bilateral pulmonary nodules measuring up to 10 mm in the right middle lobe, nodules are indeterminate, infectious/inflammatory etiology versus metastatic disease. Multiple osseous lytic lesions, largest lesion involving the left pubic bone concerning for metastatic disease.Compression fractures at T9, age indeterminate. Appearance focal area of thickening at the gastroesophageal junction.  Further evaluation with upper GI study or direct visualization with ndoscopy is recommended No bowel obstruction.Aortic atherosclerosis.   02/04/2020 Cancer Staging   Staging form: Plasma Cell Myeloma and Plasma Cell Disorders, AJCC 8th Edition - Clinical stage from 02/04/2020: Beta-2-microglobulin (mg/L): 2.1, Albumin (g/dL): 2.7, ISS: Stage II, High-risk cytogenetics: Absent, LDH: Unknown - Signed by YEarlie Server MD on 05/06/2020   02/21/2020 Initial Diagnosis   Multiple myeloma  Baseline M protein 4.1, kappa light chain level 333.8 Beta-2 microglobulin 2.1, albumin 2.7.  -02/12/2020, bone marrow biopsy showed hypercellular for age, 80% plasma cell which are complex restricted by light chain in situ heparinization.  Background hematopoiesis  is present  but reduced. Cytogenetics is normal, Myeloma FISH panel-deletion 1P,Standard risk.   02/27/2020 - 05/06/2021 Chemotherapy   VRd     09/01/2020 Bone Marrow Biopsy   Bone marrow results were reviewed.  1% plasma cell.  Normal cytogenetics.  Myeloma FISH negative for 1p del.   04/21/2021 Bone Marrow Biopsy   Bone marrow after 14 cycles of VRD -[January 2023 ]showed plasma cells 2%.-   06/25/2021 Bone Marrow Transplant   Patient underwent autologous bone marrow transplant at [facility]   09/2021 -  Chemotherapy   Revlimid 10 mg 3 weeks on 1 week off   10/26/2021 Imaging     10/26/2021 Imaging   Bilateral lower extremity venous US Partially occlusive right deep vein thrombus extending from the calf veins into the common femoral vein with limited examination of the left extremity demonstrating partially occlusive thrombus in the common femoral vein.     01/13/2022 Imaging   Bilateral lower extremity venous US 1. 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 2. Extensive predominantly occlusive/near occlusive left lower extremity DVT extending from the left common femoral vein through the imaged tibial veins, age-indeterminate in the absence of prior examinations though echogenic thrombus within the left femoral vein suggests a chronic etiology.     01/20/2022 Bone Marrow Biopsy   -Bone marrow biopsy at [facility] showed Cellular bone marrow aspirate with trilineage hematopoiesis and no morphologic evidence of plasma cell neoplasm.  -Negative minimal residual disease.    #Focal area of thickening at the GE junction. 10/23/2020 EGD is normal.   #Lung nodules, measures up to 10 mm on the right middle lobe.  Indeterminate.  Attention on follow-up.   Recommend repeat CT chest and he declined  10/28/2011, unilateral right lower extremity ultrasound showed partially occlusive right deep vein thrombosis extending from the calf vein into the, femoral vein. Patient was recommended to start on Eliquis for anticoagulation.  He tolerates well.  Right thigh pain has improved.  INTERVAL HISTORY Tommy Smith. is a 66 y.o. male who has above history reviewed by me today presents for follow up visit for multiple myeloma Patient is currently on Revlimid 10 mg 3 weeks on 1 week off for maintenance.Marland Kitchen   His appt was cancelled last week and moved to today. He started current cycle of Revlimid.  Current everyday smoker, 10 cigarettes daily.   He follows  up with [facility] transplant team.  Patient denies fever chills.  Review of Systems  Constitutional:  Negative for appetite change, chills, fatigue, fever and unexpected weight change.  HENT:   Negative for hearing loss and voice change.   Eyes:  Negative for eye problems and icterus.  Respiratory:  Negative for chest tightness, cough and shortness of breath.   Cardiovascular:  Negative for chest pain and leg swelling.  Gastrointestinal:  Negative for abdominal distention, abdominal pain and constipation.  Endocrine: Negative for hot flashes.  Genitourinary:  Negative for difficulty urinating, dysuria and frequency.   Musculoskeletal:  Negative for arthralgias, back pain and neck pain.  Skin:  Negative for itching and rash.  Neurological:  Positive for numbness. Negative for light-headedness.  Hematological:  Negative for adenopathy. Does not bruise/bleed easily.  Psychiatric/Behavioral:  Negative for confusion.      MEDICAL HISTORY:  Past Medical History:  Diagnosis  Date   Cancer [facility])    Depression    Hypercalcemia 03/02/2020   Hyperlipemia    Hypertension    Hypogonadism in male    Multiple myeloma [facility])     SURGICAL HISTORY: Past Surgical History:  Procedure Laterality Date   BONE  MARROW BIOPSY     COLONOSCOPY  07/14/2008   ESOPHAGOGASTRODUODENOSCOPY N/A 10/22/2020   Procedure: ESOPHAGOGASTRODUODENOSCOPY (EGD);  Surgeon: Jonathon Bellows, MD;  Location: [facility] ENDOSCOPY;  Service: Gastroenterology;  Laterality: N/A;    SOCIAL HISTORY: Social History   Socioeconomic History   Marital status: Married    Spouse name: [name]   Number of children: 3   Years of education: Not on file   Highest education level: Not on file  Occupational History   Not on file  Tobacco Use   Smoking status: Every Day    Packs/day: 0.50    Years: 30.00    Total pack years: 15.00    Types: Cigarettes   Smokeless tobacco: Never  Substance and Sexual Activity   Alcohol use: Yes    Comment: occcassional   Drug use: No   Sexual activity: Not on file  Other Topics Concern   Not on file  Social History Narrative   Not on file   Social Determinants of Health   Financial Resource Strain: Not on file  Food Insecurity: Not on file  Transportation Needs: Not on file  Physical Activity: Not on file  Stress: Not on file  Social Connections: Not on file  Intimate Partner Violence: Not on file    FAMILY HISTORY: Family History  Problem Relation Age of Onset   Skin cancer Mother    Prostate cancer Father     ALLERGIES:  has No Known Allergies.  MEDICATIONS:  Current Outpatient Medications  Medication Sig Dispense Refill   acyclovir (ZOVIRAX) 400 MG tablet TAKE 1 TABLET BY MOUTH TWICE A DAY 180 tablet 1   apixaban (ELIQUIS) 5 MG TABS tablet Take 1 tablet (5 mg total) by mouth 2 (two) times daily. 60 tablet 2   buPROPion (WELLBUTRIN XL) 300 MG 24 hr tablet Take 300 mg by mouth daily.     calcium carbonate (OS-CAL) 600 MG tablet Take 3 tablets (1,800 mg total) by mouth daily. 30 tablet 0   cholecalciferol (VITAMIN D3) 25 MCG (1000 UNIT) tablet Take 2,000 Units by mouth daily.     lenalidomide (REVLIMID) 10 MG capsule Take 1 capsule (10 mg total) by mouth daily. Take for 21 days, then  hold for 7 days. Repeat every 28 days. 21 capsule 0   sertraline (ZOLOFT) 25 MG tablet Take 1 tablet by mouth daily.     loperamide (IMODIUM) 2 MG capsule Take by mouth. (Patient not taking: Reported on 09/29/2021)     No current facility-administered medications for this visit.     PHYSICAL EXAMINATION: ECOG PERFORMANCE STATUS: 1 - Symptomatic but completely ambulatory Vitals:   03/29/22 1321  BP: 124/88  Pulse: 77  Resp: 18  Temp: 99.1 F (37.3 C)   Filed Weights   03/29/22 1321  Weight: 224 lb 12.8 oz (102 kg)    Physical Exam Constitutional:      General: He is not in acute distress.    Appearance: He is not diaphoretic.  HENT:     Head: Normocephalic and atraumatic.     Nose: Nose normal.     Mouth/Throat:     Pharynx: No oropharyngeal exudate.  Eyes:     General: No scleral  icterus.    Pupils: Pupils are equal, round, and reactive to light.  Cardiovascular:     Rate and Rhythm: Normal rate and regular rhythm.     Heart sounds: No murmur heard. Pulmonary:     Effort: Pulmonary effort is normal. No respiratory distress.     Breath sounds: No rales.  Chest:     Chest wall: No tenderness.  Abdominal:     General: There is no distension.     Palpations: Abdomen is soft.     Tenderness: There is no abdominal tenderness.  Musculoskeletal:        General: Normal range of motion.     Cervical back: Normal range of motion and neck supple.  Skin:    General: Skin is warm and dry.     Findings: No erythema.  Neurological:     Mental Status: He is alert and oriented to person, place, and time.     Cranial Nerves: No cranial nerve deficit.     Motor: No abnormal muscle tone.     Coordination: Coordination normal.  Psychiatric:        Mood and Affect: Affect normal.       LABORATORY DATA:  I have reviewed the data as listed    Latest Ref Rng & Units 03/29/2022   12:39 PM 02/25/2022   10:49 AM 01/27/2022   11:34 AM  CBC  WBC 4.0 - 10.5 K/uL 2.7  3.1  3.2    Hemoglobin 13.0 - 17.0 g/dL 14.0  14.5  14.1   Hematocrit 39.0 - 52.0 % 42.7  44.7  43.9   Platelets 150 - 400 K/uL 149  138  142       Latest Ref Rng & Units 03/29/2022   12:39 PM 02/25/2022   10:49 AM 01/27/2022   11:34 AM  CMP  Glucose 70 - 99 mg/dL 81  93  87   BUN 8 - 23 mg/dL _0 Creatinine 0.61 - 1.24 mg/dL 1.13  1.04  0.97   Sodium 135 - 145 mmol/L 139  135  137   Potassium 3.5 - 5.1 mmol/L 3.7  4.1  4.0   Chloride 98 - 111 mmol/L 106  104  105   CO2 22 - 32 mmol/L _1 Calcium 8.9 - 10.3 mg/dL 9.2  9.2  8.9   Total Protein 6.5 - 8.1 g/dL 7.0  6.8  7.0   Total Bilirubin 0.3 - 1.2 mg/dL 0.7  0.9  1.0   Alkaline Phos 38 - 126 U/L 47  49  52   AST 15 - 41 U/L _2 ALT 0 - 44 U/L _3 RADIOGRAPHIC STUDIES: I have personally reviewed the radiological images as listed and agreed with the findings in the report. US Venous Img Lower Bilateral (DVT)  Result Date: 01/13/2022 CLINICAL DATA:  History of lower extremity DVT, currently on anticoagulation. Evaluate for acute or chronic DVT. EXAM: BILATERAL LOWER EXTREMITY VENOUS DOPPLER ULTRASOUND TECHNIQUE: Gray-scale sonography with graded compression, as well as color Doppler and duplex ultrasound were performed to evaluate the lower extremity deep venous systems from the level of the common femoral vein and including the common femoral, femoral, profunda femoral, popliteal and calf veins including the posterior tibial, peroneal and gastrocnemius veins when visible. The superficial great saphenous vein was also interrogated. Spectral Doppler was utilized to evaluate flow  at rest and with distal augmentation maneuvers in the common femoral, femoral and popliteal veins. COMPARISON:  Right lower extremity venous Doppler ultrasound (positive for DVT extending from the right common femoral vein through the right tibial veins as well as DVT involving the left common femoral vein). FINDINGS: RIGHT LOWER EXTREMITY  There is hypoechoic near occlusive DVT involving the right common femoral vein (image 7 and 8). The saphenofemoral junction appears patent (image 13), improved compared to the 10/2021 examination. There is hypoechoic near occlusive thrombus involving the imaged portions of the right deep femoral vein (image 16), similar to the 10/2021 examination. There is hypoechoic occlusive thrombus involving the proximal (image 20), mid (image 24) and distal (image 28) aspects of the right femoral vein, similar to the 10/2021 examination. There is mixed echogenic occlusive thrombus involving the right popliteal vein (images 31 and 32), unchanged to slightly worsened compared to the 10/2021 examination, extending to involve both paired divisions of the right posterior tibial vein (image 40) as well as both paired divisions of the right peroneal vein (image 43). Other Findings:  None. LEFT LOWER EXTREMITY There is mixed echogenic near occlusive thrombus involving the distal aspect of the left common femoral vein (image 5), extending to involve the saphenofemoral junction (image 8), similar to the 10/2021 examination. The left deep femoral vein appears patent (image 11). There is echogenic chronic occlusive thrombus involving the proximal (image 15), mid (image 19) and distal (image 23) aspects of the left femoral vein. There is mixed echogenic occlusive thrombus involving the left popliteal vein (image 28 and 32), extending to involve both paired divisions of the left posterior tibial vein (images 35 and 36) as well as the left peroneal vein (image 39). Other Findings:  None. IMPRESSION: 1. 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 2. Extensive predominantly  occlusive/near occlusive left lower extremity DVT extending from the left common femoral vein through the imaged tibial veins, age-indeterminate in the absence of prior examinations though echogenic thrombus within the left femoral vein suggests a chronic etiology. Electronically Signed   By: Sandi Mariscal M.D.   On: 01/13/2022 11:53

## 2022-04-05 ENCOUNTER — Other Ambulatory Visit: Payer: Medicare Other

## 2022-04-05 ENCOUNTER — Inpatient Hospital Stay: Payer: Medicare Other

## 2022-04-05 ENCOUNTER — Other Ambulatory Visit: Payer: Self-pay | Admitting: Oncology

## 2022-04-05 DIAGNOSIS — C9000 Multiple myeloma not having achieved remission: Secondary | ICD-10-CM | POA: Diagnosis not present

## 2022-04-05 LAB — MULTIPLE MYELOMA PANEL, SERUM
Albumin SerPl Elph-Mcnc: 3.7 g/dL (ref 2.9–4.4)
Albumin/Glob SerPl: 1.5 (ref 0.7–1.7)
Alpha 1: 0.2 g/dL (ref 0.0–0.4)
Alpha2 Glob SerPl Elph-Mcnc: 0.7 g/dL (ref 0.4–1.0)
B-Globulin SerPl Elph-Mcnc: 1 g/dL (ref 0.7–1.3)
Gamma Glob SerPl Elph-Mcnc: 0.6 g/dL (ref 0.4–1.8)
Globulin, Total: 2.6 g/dL (ref 2.2–3.9)
IgA: 163 mg/dL (ref 61–437)
IgG (Immunoglobin G), Serum: 570 mg/dL — ABNORMAL LOW (ref 603–1613)
IgM (Immunoglobulin M), Srm: 10 mg/dL — ABNORMAL LOW (ref 20–172)
Total Protein ELP: 6.3 g/dL (ref 6.0–8.5)

## 2022-04-05 LAB — CBC WITH DIFFERENTIAL/PLATELET
Abs Immature Granulocytes: 0.02 10*3/uL (ref 0.00–0.07)
Basophils Absolute: 0 10*3/uL (ref 0.0–0.1)
Basophils Relative: 0 %
Eosinophils Absolute: 0.2 10*3/uL (ref 0.0–0.5)
Eosinophils Relative: 5 %
HCT: 40.8 % (ref 39.0–52.0)
Hemoglobin: 13.6 g/dL (ref 13.0–17.0)
Immature Granulocytes: 1 %
Lymphocytes Relative: 43 %
Lymphs Abs: 1.4 10*3/uL (ref 0.7–4.0)
MCH: 30.6 pg (ref 26.0–34.0)
MCHC: 33.3 g/dL (ref 30.0–36.0)
MCV: 91.9 fL (ref 80.0–100.0)
Monocytes Absolute: 0.3 10*3/uL (ref 0.1–1.0)
Monocytes Relative: 8 %
Neutro Abs: 1.4 10*3/uL — ABNORMAL LOW (ref 1.7–7.7)
Neutrophils Relative %: 43 %
Platelets: 134 10*3/uL — ABNORMAL LOW (ref 150–400)
RBC: 4.44 MIL/uL (ref 4.22–5.81)
RDW: 17.8 % — ABNORMAL HIGH (ref 11.5–15.5)
WBC: 3.2 10*3/uL — ABNORMAL LOW (ref 4.0–10.5)
nRBC: 0 % (ref 0.0–0.2)

## 2022-04-06 ENCOUNTER — Telehealth: Payer: Self-pay

## 2022-04-06 DIAGNOSIS — C9000 Multiple myeloma not having achieved remission: Secondary | ICD-10-CM

## 2022-04-06 MED ORDER — LENALIDOMIDE 10 MG PO CAPS: 10.0000 mg | ORAL_CAPSULE | Freq: Every day | ORAL | 0 refills | Status: DC

## 2022-04-06 NOTE — Telephone Encounter (Signed)
Unable to reach pt by phone, but detailed message left. Mychart message also left to pt.   Please schedule and notify pt of appt:   Lab/MD/ Zometa in 4 weeks

## 2022-04-06 NOTE — Telephone Encounter (Signed)
-----   Message from Earlie Server, MD sent at 04/05/2022 10:57 PM EST ----- Advise him to resume Revlimid  Follow up in 4 weeks, lab MD, please refill next cycle of revlimid.

## 2022-04-06 NOTE — Telephone Encounter (Signed)
Thanks. I see he is scheduled for Dara, He will need zometa not Dara.

## 2022-05-04 ENCOUNTER — Inpatient Hospital Stay: Payer: Medicare Other

## 2022-05-04 ENCOUNTER — Inpatient Hospital Stay: Payer: Medicare Other | Attending: Oncology

## 2022-05-04 ENCOUNTER — Inpatient Hospital Stay: Payer: Medicare Other | Admitting: Oncology

## 2022-05-04 ENCOUNTER — Encounter: Payer: Self-pay | Admitting: Oncology

## 2022-05-04 VITALS — BP 142/91 | HR 71 | Temp 97.9°F | Resp 18 | Wt 225.0 lb

## 2022-05-04 DIAGNOSIS — Z7961 Long term (current) use of immunomodulator: Secondary | ICD-10-CM | POA: Insufficient documentation

## 2022-05-04 DIAGNOSIS — I10 Essential (primary) hypertension: Secondary | ICD-10-CM | POA: Diagnosis not present

## 2022-05-04 DIAGNOSIS — Z9481 Bone marrow transplant status: Secondary | ICD-10-CM | POA: Insufficient documentation

## 2022-05-04 DIAGNOSIS — C9000 Multiple myeloma not having achieved remission: Secondary | ICD-10-CM

## 2022-05-04 DIAGNOSIS — T451X5A Adverse effect of antineoplastic and immunosuppressive drugs, initial encounter: Secondary | ICD-10-CM

## 2022-05-04 DIAGNOSIS — Z7901 Long term (current) use of anticoagulants: Secondary | ICD-10-CM | POA: Diagnosis not present

## 2022-05-04 DIAGNOSIS — I825Y1 Chronic embolism and thrombosis of unspecified deep veins of right proximal lower extremity: Secondary | ICD-10-CM | POA: Diagnosis not present

## 2022-05-04 DIAGNOSIS — D702 Other drug-induced agranulocytosis: Secondary | ICD-10-CM | POA: Diagnosis not present

## 2022-05-04 DIAGNOSIS — Z79624 Long term (current) use of inhibitors of nucleotide synthesis: Secondary | ICD-10-CM | POA: Insufficient documentation

## 2022-05-04 DIAGNOSIS — Z79899 Other long term (current) drug therapy: Secondary | ICD-10-CM | POA: Insufficient documentation

## 2022-05-04 DIAGNOSIS — F17210 Nicotine dependence, cigarettes, uncomplicated: Secondary | ICD-10-CM | POA: Insufficient documentation

## 2022-05-04 DIAGNOSIS — Z86718 Personal history of other venous thrombosis and embolism: Secondary | ICD-10-CM | POA: Diagnosis not present

## 2022-05-04 DIAGNOSIS — G620 Drug-induced polyneuropathy: Secondary | ICD-10-CM

## 2022-05-04 DIAGNOSIS — Z5111 Encounter for antineoplastic chemotherapy: Secondary | ICD-10-CM

## 2022-05-04 LAB — COMPREHENSIVE METABOLIC PANEL
ALT: 16 U/L (ref 0–44)
AST: 16 U/L (ref 15–41)
Albumin: 4.1 g/dL (ref 3.5–5.0)
Alkaline Phosphatase: 52 U/L (ref 38–126)
Anion gap: 9 (ref 5–15)
BUN: 17 mg/dL (ref 8–23)
CO2: 24 mmol/L (ref 22–32)
Calcium: 9.3 mg/dL (ref 8.9–10.3)
Chloride: 105 mmol/L (ref 98–111)
Creatinine, Ser: 1.1 mg/dL (ref 0.61–1.24)
GFR, Estimated: >60 mL/min (ref >60)
Glucose, Bld: 89 mg/dL (ref 70–99)
Potassium: 3.9 mmol/L (ref 3.5–5.1)
Sodium: 138 mmol/L (ref 135–145)
Total Bilirubin: 1.1 mg/dL (ref 0.3–1.2)
Total Protein: 7 g/dL (ref 6.5–8.1)

## 2022-05-04 LAB — CBC WITH DIFFERENTIAL/PLATELET
Abs Immature Granulocytes: 0.01 10*3/uL (ref 0.00–0.07)
Basophils Absolute: 0 10*3/uL (ref 0.0–0.1)
Basophils Relative: 0 %
Eosinophils Absolute: 0.1 10*3/uL (ref 0.0–0.5)
Eosinophils Relative: 3 %
HCT: 43.6 % (ref 39.0–52.0)
Hemoglobin: 13.7 g/dL (ref 13.0–17.0)
Immature Granulocytes: 0 %
Lymphocytes Relative: 46 %
Lymphs Abs: 1.6 10*3/uL (ref 0.7–4.0)
MCH: 30.4 pg (ref 26.0–34.0)
MCHC: 31.4 g/dL (ref 30.0–36.0)
MCV: 96.7 fL (ref 80.0–100.0)
Monocytes Absolute: 0.3 10*3/uL (ref 0.1–1.0)
Monocytes Relative: 7 %
Neutro Abs: 1.5 10*3/uL — ABNORMAL LOW (ref 1.7–7.7)
Neutrophils Relative %: 44 %
Platelets: 161 10*3/uL (ref 150–400)
RBC: 4.51 MIL/uL (ref 4.22–5.81)
RDW: 18.1 % — ABNORMAL HIGH (ref 11.5–15.5)
WBC: 3.5 10*3/uL — ABNORMAL LOW (ref 4.0–10.5)
nRBC: 0 % (ref 0.0–0.2)

## 2022-05-04 MED ORDER — SODIUM CHLORIDE 0.9 % IV SOLN
INTRAVENOUS | Status: DC
  Filled 2022-05-04: qty 250

## 2022-05-04 MED ORDER — ZOLEDRONIC ACID 4 MG/100ML IV SOLN
4.0000 mg | Freq: Once | INTRAVENOUS | Status: AC
  Administered 2022-05-04: 4 mg via INTRAVENOUS
  Filled 2022-05-04: qty 100

## 2022-05-04 MED ORDER — LENALIDOMIDE 10 MG PO CAPS: 10.0000 mg | ORAL_CAPSULE | Freq: Every day | ORAL | 0 refills | Status: DC

## 2022-05-04 NOTE — Assessment & Plan Note (Signed)
ANC is stable. monitor 

## 2022-05-04 NOTE — Assessment & Plan Note (Signed)
Grade 1, stable symptoms.  Observation 

## 2022-05-04 NOTE — Progress Notes (Signed)
Pt here for follow up. No new concerns voiced.   

## 2022-05-04 NOTE — Assessment & Plan Note (Signed)
Treatment as planned. 

## 2022-05-04 NOTE — Progress Notes (Signed)
Hematology/Oncology Progress note [phone] [phone]      Patient Care Team: Sofie Hartigan, MD as PCP - General (Family Medicine) Noreene Filbert, MD as Radiation Oncologist (Radiation Oncology)   ASSESSMENT & PLAN:   Cancer Staging  Multiple myeloma not having achieved remission [facility]) Staging form: Plasma Cell Myeloma and Plasma Cell Disorders, AJCC 8th Edition - Clinical stage from 02/04/2020: Beta-2-microglobulin (mg/L): 2.1, Albumin (g/dL): 2.7, ISS: Stage II, High-risk cytogenetics: Absent, LDH: Unknown - Signed by Earlie Server, MD on 05/06/2020   Multiple myeloma not having achieved remission ([place]) #IgG multiple myeloma, stage II, del 1 p, high risk.  Declined bone marrow transplant evaluation. 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 Labs are reviewed and discussed with patient. Proceed with this cycle of Revlimid '10mg'$  3 weeks on 1 week off.   Zometa Q4month   Chemotherapy-induced neuropathy (HCC) Grade 1, stable symptoms.  Observation  Chronic deep vein thrombosis (DVT) (HCC) Continue anticoagulation with Eliquis '5mg'$  BID Vascular surgeon recommends no intervention due to lack of significant symptoms.    Drug-induced neutropenia (HCC) ANC is stable. monitor  Encounter for antineoplastic chemotherapy Treatment as planned.  Influenza vaccination today Orders Placed This Encounter  Procedures   CBC with Differential/Platelet    Standing Status:   Future    Standing Expiration Date:   05/05/2023   Comprehensive metabolic panel    Standing Status:   Future    Standing Expiration Date:   05/04/2023   Multiple Myeloma Panel (SPEP&IFE w/QIG)    Standing  Status:   Future    Standing Expiration Date:   05/04/2023   Kappa/lambda light chains    Standing Status:   Future    Standing Expiration Date:   05/04/2023    Lab MD  4 weeks   All questions were answered. The patient knows to call the clinic with any problems, questions or concerns.  ZEarlie Server MD, PhD [facility] Hematology Oncology 05/04/2022       CHIEF COMPLAINTS/REASON FOR VISIT:  Follow-up for multiple myeloma HISTORY OF PRESENTING ILLNESS:   JRosendo Couser is a  67y.o.  male presents for follow-up of management of multiple myeloma Oncology history summary listed as below  Oncology History  [place] achieved remission [place]  01/29/2020 Imaging   CT showed multiple bilateral pulmonary nodules measuring up to 10 mm in the right middle lobe, nodules are indeterminate, infectious/inflammatory etiology versus metastatic disease. Multiple osseous lytic lesions, largest lesion involving the left pubic bone concerning for metastatic disease.Compression fractures at T9, age indeterminate. Appearance focal area of thickening at the gastroesophageal junction.  Further evaluation with upper GI study or direct visualization with ndoscopy is recommended No bowel obstruction.Aortic atherosclerosis.   02/04/2020 Cancer Staging   Staging form: Plasma Cell Myeloma and Plasma Cell Disorders, AJCC 8th Edition - Clinical stage from 02/04/2020: Beta-2-microglobulin (mg/L): 2.1, Albumin (g/dL): 2.7, ISS: Stage II, High-risk cytogenetics: Absent, LDH: Unknown - Signed by YEarlie Server MD on 05/06/2020   02/21/2020 Initial Diagnosis   Multiple myeloma  Baseline M protein 4.1, kappa light chain level 333.8 Beta-2 microglobulin 2.1, albumin 2.7.  -02/12/2020, bone marrow biopsy showed hypercellular for age, 80% plasma cell which are complex restricted by light chain  in situ heparinization.  Background hematopoiesis is present but reduced. Cytogenetics is normal, Myeloma FISH  panel-deletion 1P,Standard risk.   02/27/2020 - 05/06/2021 Chemotherapy   VRd     09/01/2020 Bone Marrow Biopsy   Bone marrow results were reviewed.  1% plasma cell.  Normal cytogenetics.  Myeloma FISH negative for 1p del.   04/21/2021 Bone Marrow Biopsy   Bone marrow after 14 cycles of VRD -[January 2023 ]showed plasma cells 2%.-   06/25/2021 Bone Marrow Transplant   Patient underwent [facility]   09/2021 -  Chemotherapy   Revlimid 10 mg 3 weeks on 1 week off   10/26/2021 Imaging     10/26/2021 Imaging   Bilateral lower extremity venous US Partially occlusive right deep vein thrombus extending from the calf veins into the common femoral vein with limited examination of the left extremity demonstrating partially occlusive thrombus in the common femoral vein.     01/13/2022 Imaging   Bilateral lower extremity venous US 1. 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 2. Extensive predominantly occlusive/near occlusive left lower extremity DVT extending from the left common femoral vein through the imaged tibial veins, age-indeterminate in the absence of prior examinations though echogenic thrombus within the left femoral vein suggests a chronic etiology.     01/20/2022 Bone Marrow Biopsy   -Bone marrow biopsy at [facility] showed Cellular bone marrow aspirate with trilineage hematopoiesis and no morphologic evidence of plasma cell neoplasm.  -Negative minimal residual disease.    #Focal area of thickening at the GE junction. 10/23/2020 EGD is normal.   #Lung nodules, measures up to 10 mm on the right middle lobe.  Indeterminate.  Attention on follow-up.  Recommend repeat CT chest and he declined  10/28/2011,  unilateral right lower extremity ultrasound showed partially occlusive right deep vein thrombosis extending from the calf vein into the, femoral vein. Patient was recommended to start on Eliquis for anticoagulation.  He tolerates well.  Right thigh pain has improved.  INTERVAL HISTORY [place]. is a 67 y.o. male who has above history reviewed by me today presents for follow up visit for multiple myeloma Patient is currently on Revlimid 10 mg 3 weeks on 1 week off for maintenance..   Current everyday smoker, 10 cigarettes daily.   He follows  up with [facility] transplant team.  Patient denies fever chills.  Review of Systems  Constitutional:  Negative for appetite change, chills, fatigue, fever and unexpected weight change.  HENT:   Negative for hearing loss and voice change.   Eyes:  Negative for eye problems and icterus.  Respiratory:  Negative for chest tightness, cough and shortness of breath.   Cardiovascular:  Negative for chest pain and leg swelling.  Gastrointestinal:  Negative for abdominal distention, abdominal pain and constipation.  Endocrine: Negative for hot flashes.  Genitourinary:  Negative for difficulty urinating, dysuria and frequency.   Musculoskeletal:  Negative for arthralgias, back pain and neck pain.  Skin:  Negative for itching and rash.  Neurological:  Positive for numbness. Negative for light-headedness.  Hematological:  Negative for adenopathy. Does not bruise/bleed easily.  Psychiatric/Behavioral:  Negative for confusion.      MEDICAL HISTORY:  Past Medical History:  Diagnosis Date   Cancer [facility])    Depression  Hypercalcemia 03/02/2020   Hyperlipemia    Hypertension    Hypogonadism in male    Multiple myeloma [facility])     SURGICAL HISTORY: Past Surgical History:  Procedure Laterality Date   BONE MARROW BIOPSY     COLONOSCOPY  07/14/2008   ESOPHAGOGASTRODUODENOSCOPY N/A 10/22/2020   Procedure: ESOPHAGOGASTRODUODENOSCOPY (EGD);  Surgeon: Jonathon Bellows, MD;  Location: [facility] ENDOSCOPY;  Service: Gastroenterology;  Laterality: N/A;    SOCIAL HISTORY: Social History   Socioeconomic History   Marital status: Married    Spouse name: [name]   Number of children: 3   Years of education: Not on file   Highest education level: Not on file  Occupational History   Not on file  Tobacco Use   Smoking status: Every Day    Packs/day: 0.50    Years: 30.00    Total pack years: 15.00    Types: Cigarettes   Smokeless tobacco: Never  Substance and Sexual Activity   Alcohol use: Yes    Comment: occcassional   Drug use: No   Sexual activity: Not on file  Other Topics Concern   Not on file  Social History Narrative   Not on file   Social Determinants of Health   Financial Resource Strain: Not on file  Food Insecurity: Not on file  Transportation Needs: Not on file  Physical Activity: Not on file  Stress: Not on file  Social Connections: Not on file  Intimate Partner Violence: Not on file    FAMILY HISTORY: Family History  Problem Relation Age of Onset   Skin cancer Mother    Prostate cancer Father     ALLERGIES:  has No Known Allergies.  MEDICATIONS:  Current Outpatient Medications  Medication Sig Dispense Refill   acyclovir (ZOVIRAX) 400 MG tablet TAKE 1 TABLET BY MOUTH TWICE A DAY 180 tablet 1   apixaban (ELIQUIS) 5 MG TABS tablet Take 1 tablet (5 mg total) by mouth 2 (two) times daily. 60 tablet 2   buPROPion (WELLBUTRIN XL) 300 MG 24 hr tablet Take 300 mg by mouth daily.     calcium carbonate (OS-CAL) 600 MG tablet Take 3 tablets (1,800 mg total) by mouth daily. 30 tablet 0   cholecalciferol (VITAMIN D3) 25 MCG (1000 UNIT) tablet Take 2,000 Units by mouth daily.     loperamide (IMODIUM) 2 MG capsule Take by mouth.     sertraline (ZOLOFT) 25 MG tablet Take 1 tablet by mouth daily.     lenalidomide (REVLIMID) 10 MG capsule Take 1 capsule (10 mg total) by mouth daily. Take for 21 days, then hold for 7 days. Repeat  every 28 days. 21 capsule 0   No current facility-administered medications for this visit.     PHYSICAL EXAMINATION: ECOG PERFORMANCE STATUS: 1 - Symptomatic but completely ambulatory Vitals:   05/04/22 1319  BP: (!) 142/91  Pulse: 71  Resp: 18  Temp: 97.9 F (36.6 C)   Filed Weights   05/04/22 1319  Weight: 225 lb (102.1 kg)    Physical Exam Constitutional:      General: He is not in acute distress.    Appearance: He is not diaphoretic.  HENT:     Head: Normocephalic and atraumatic.     Nose: Nose normal.     Mouth/Throat:     Pharynx: No oropharyngeal exudate.  Eyes:     General: No scleral icterus.    Pupils: Pupils are equal, round, and reactive to light.  Cardiovascular:  Rate and Rhythm: Normal rate and regular rhythm.     Heart sounds: No murmur heard. Pulmonary:     Effort: Pulmonary effort is normal. No respiratory distress.     Breath sounds: No rales.  Chest:     Chest wall: No tenderness.  Abdominal:     General: There is no distension.     Palpations: Abdomen is soft.     Tenderness: There is no abdominal tenderness.  Musculoskeletal:        General: Normal range of motion.     Cervical back: Normal range of motion and neck supple.  Skin:    General: Skin is warm and dry.     Findings: No erythema.  Neurological:     Mental Status: He is alert and oriented to person, place, and time.     Cranial Nerves: No cranial nerve deficit.     Motor: No abnormal muscle tone.     Coordination: Coordination normal.  Psychiatric:        Mood and Affect: Affect normal.       LABORATORY DATA:  I have reviewed the data as listed    Latest Ref Rng & Units 05/04/2022   12:50 PM 04/05/2022    7:57 AM 03/29/2022   12:39 PM  CBC  WBC 4.0 - 10.5 K/uL 3.5  3.2  2.7   Hemoglobin 13.0 - 17.0 g/dL 13.7  13.6  14.0   Hematocrit 39.0 - 52.0 % 43.6  40.8  42.7   Platelets 150 - 400 K/uL 161  134  149       Latest Ref Rng & Units 05/04/2022   12:50 PM  03/29/2022   12:39 PM 02/25/2022   10:49 AM  CMP  Glucose 70 - 99 mg/dL 89  81  93   BUN 8 - 23 mg/dL '17  16  9   '$ Creatinine 0.61 - 1.24 mg/dL 1.10  1.13  1.04   Sodium 135 - 145 mmol/L 138  139  135   Potassium 3.5 - 5.1 mmol/L 3.9  3.7  4.1   Chloride 98 - 111 mmol/L 105  106  104   CO2 22 - 32 mmol/L '24  26  25   '$ Calcium 8.9 - 10.3 mg/dL 9.3  9.2  9.2   Total Protein 6.5 - 8.1 g/dL 7.0  7.0  6.8   Total Bilirubin 0.3 - 1.2 mg/dL 1.1  0.7  0.9   Alkaline Phos 38 - 126 U/L 52  47  49   AST 15 - 41 U/L '16  18  18   '$ ALT 0 - 44 U/L '16  20  21     '$ RADIOGRAPHIC STUDIES: I have personally reviewed the radiological images as listed and agreed with the findings in the report. No results found.

## 2022-05-04 NOTE — Assessment & Plan Note (Signed)
Continue anticoagulation with Eliquis 5mg BID Vascular surgeon recommends no intervention due to lack of significant symptoms.   

## 2022-05-04 NOTE — Assessment & Plan Note (Addendum)
#  IgG multiple myeloma, stage II, del 1 p, high risk.  Declined bone marrow transplant evaluation. 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 Labs are reviewed and discussed with patient. Proceed with this cycle of Revlimid '10mg'$  3 weeks on 1 week off.   Zometa Q70month

## 2022-05-05 ENCOUNTER — Telehealth: Payer: Self-pay

## 2022-05-05 ENCOUNTER — Other Ambulatory Visit: Payer: Self-pay

## 2022-05-05 DIAGNOSIS — C9000 Multiple myeloma not having achieved remission: Secondary | ICD-10-CM

## 2022-05-05 LAB — KAPPA/LAMBDA LIGHT CHAINS
Kappa free light chain: 11 mg/L (ref 3.3–19.4)
Kappa, lambda light chain ratio: 1.64 (ref 0.26–1.65)
Lambda free light chains: 6.7 mg/L (ref 5.7–26.3)

## 2022-05-05 MED ORDER — LENALIDOMIDE 10 MG PO CAPS: 10.0000 mg | ORAL_CAPSULE | Freq: Every day | ORAL | 0 refills | Status: DC

## 2022-05-05 NOTE — Telephone Encounter (Signed)
Refill resubmitted with new REMS#.

## 2022-05-05 NOTE — Telephone Encounter (Signed)
The rems # on the revlimid rx sent yesterday was the rems # from December they are requesting a new rx with the updated rems #

## 2022-05-09 LAB — MULTIPLE MYELOMA PANEL, SERUM
Albumin SerPl Elph-Mcnc: 3.7 g/dL (ref 2.9–4.4)
Albumin/Glob SerPl: 1.7 (ref 0.7–1.7)
Alpha 1: 0.2 g/dL (ref 0.0–0.4)
Alpha2 Glob SerPl Elph-Mcnc: 0.6 g/dL (ref 0.4–1.0)
B-Globulin SerPl Elph-Mcnc: 0.9 g/dL (ref 0.7–1.3)
Gamma Glob SerPl Elph-Mcnc: 0.5 g/dL (ref 0.4–1.8)
Globulin, Total: 2.2 g/dL (ref 2.2–3.9)
IgA: 161 mg/dL (ref 61–437)
IgG (Immunoglobin G), Serum: 560 mg/dL — ABNORMAL LOW (ref 603–1613)
IgM (Immunoglobulin M), Srm: 8 mg/dL — ABNORMAL LOW (ref 20–172)
Total Protein ELP: 5.9 g/dL — ABNORMAL LOW (ref 6.0–8.5)

## 2022-06-01 ENCOUNTER — Encounter: Payer: Self-pay | Admitting: Oncology

## 2022-06-01 ENCOUNTER — Other Ambulatory Visit: Payer: Self-pay

## 2022-06-01 ENCOUNTER — Inpatient Hospital Stay: Payer: Medicare Other | Attending: Oncology

## 2022-06-01 ENCOUNTER — Inpatient Hospital Stay: Payer: Medicare Other | Admitting: Oncology

## 2022-06-01 VITALS — BP 134/90 | HR 88 | Temp 97.7°F | Resp 18 | Wt 225.2 lb

## 2022-06-01 DIAGNOSIS — I825Y1 Chronic embolism and thrombosis of unspecified deep veins of right proximal lower extremity: Secondary | ICD-10-CM

## 2022-06-01 DIAGNOSIS — I82513 Chronic embolism and thrombosis of femoral vein, bilateral: Secondary | ICD-10-CM | POA: Diagnosis not present

## 2022-06-01 DIAGNOSIS — T451X5A Adverse effect of antineoplastic and immunosuppressive drugs, initial encounter: Secondary | ICD-10-CM

## 2022-06-01 DIAGNOSIS — Z808 Family history of malignant neoplasm of other organs or systems: Secondary | ICD-10-CM | POA: Diagnosis not present

## 2022-06-01 DIAGNOSIS — D702 Other drug-induced agranulocytosis: Secondary | ICD-10-CM | POA: Insufficient documentation

## 2022-06-01 DIAGNOSIS — F17210 Nicotine dependence, cigarettes, uncomplicated: Secondary | ICD-10-CM | POA: Diagnosis not present

## 2022-06-01 DIAGNOSIS — G620 Drug-induced polyneuropathy: Secondary | ICD-10-CM | POA: Diagnosis not present

## 2022-06-01 DIAGNOSIS — R918 Other nonspecific abnormal finding of lung field: Secondary | ICD-10-CM | POA: Insufficient documentation

## 2022-06-01 DIAGNOSIS — Z9481 Bone marrow transplant status: Secondary | ICD-10-CM | POA: Insufficient documentation

## 2022-06-01 DIAGNOSIS — Z7901 Long term (current) use of anticoagulants: Secondary | ICD-10-CM | POA: Diagnosis not present

## 2022-06-01 DIAGNOSIS — C9000 Multiple myeloma not having achieved remission: Secondary | ICD-10-CM

## 2022-06-01 DIAGNOSIS — I10 Essential (primary) hypertension: Secondary | ICD-10-CM | POA: Insufficient documentation

## 2022-06-01 DIAGNOSIS — Z8042 Family history of malignant neoplasm of prostate: Secondary | ICD-10-CM | POA: Insufficient documentation

## 2022-06-01 DIAGNOSIS — Z5111 Encounter for antineoplastic chemotherapy: Secondary | ICD-10-CM

## 2022-06-01 LAB — COMPREHENSIVE METABOLIC PANEL
ALT: 15 U/L (ref 0–44)
AST: 16 U/L (ref 15–41)
Albumin: 4.1 g/dL (ref 3.5–5.0)
Alkaline Phosphatase: 57 U/L (ref 38–126)
Anion gap: 9 (ref 5–15)
BUN: 11 mg/dL (ref 8–23)
CO2: 25 mmol/L (ref 22–32)
Calcium: 9 mg/dL (ref 8.9–10.3)
Chloride: 104 mmol/L (ref 98–111)
Creatinine, Ser: 1.14 mg/dL (ref 0.61–1.24)
GFR, Estimated: >60 mL/min (ref >60)
Glucose, Bld: 76 mg/dL (ref 70–99)
Potassium: 3.4 mmol/L — ABNORMAL LOW (ref 3.5–5.1)
Sodium: 138 mmol/L (ref 135–145)
Total Bilirubin: 1 mg/dL (ref 0.3–1.2)
Total Protein: 7 g/dL (ref 6.5–8.1)

## 2022-06-01 LAB — CBC WITH DIFFERENTIAL/PLATELET
Abs Immature Granulocytes: 0.01 10*3/uL (ref 0.00–0.07)
Basophils Absolute: 0 10*3/uL (ref 0.0–0.1)
Basophils Relative: 1 %
Eosinophils Absolute: 0.1 10*3/uL (ref 0.0–0.5)
Eosinophils Relative: 4 %
HCT: 45.2 % (ref 39.0–52.0)
Hemoglobin: 14.7 g/dL (ref 13.0–17.0)
Immature Granulocytes: 0 %
Lymphocytes Relative: 48 %
Lymphs Abs: 1.7 10*3/uL (ref 0.7–4.0)
MCH: 30.7 pg (ref 26.0–34.0)
MCHC: 32.5 g/dL (ref 30.0–36.0)
MCV: 94.4 fL (ref 80.0–100.0)
Monocytes Absolute: 0.3 10*3/uL (ref 0.1–1.0)
Monocytes Relative: 9 %
Neutro Abs: 1.3 10*3/uL — ABNORMAL LOW (ref 1.7–7.7)
Neutrophils Relative %: 38 %
Platelets: 152 10*3/uL (ref 150–400)
RBC: 4.79 MIL/uL (ref 4.22–5.81)
RDW: 16.4 % — ABNORMAL HIGH (ref 11.5–15.5)
WBC: 3.5 10*3/uL — ABNORMAL LOW (ref 4.0–10.5)
nRBC: 0 % (ref 0.0–0.2)

## 2022-06-01 MED ORDER — LENALIDOMIDE 10 MG PO CAPS: 10.0000 mg | ORAL_CAPSULE | Freq: Every day | ORAL | 0 refills | Status: DC

## 2022-06-01 NOTE — Assessment & Plan Note (Signed)
Grade 1, stable symptoms.  Observation

## 2022-06-01 NOTE — Assessment & Plan Note (Signed)
#  IgG multiple myeloma, stage II, del 1 p, high risk.  Declined bone marrow transplant evaluation. 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 Labs are reviewed and discussed with patient. Proceed with this cycle of Revlimid 26m 3 weeks on 1 week off.   Zometa Q380month- April 2024

## 2022-06-01 NOTE — Assessment & Plan Note (Signed)
Continue anticoagulation with Eliquis 40m BID Vascular surgeon recommends no intervention due to lack of significant symptoms.

## 2022-06-01 NOTE — Assessment & Plan Note (Signed)
Patient follows up with oncology for post marrow transplant vaccination. Continue acyclovir 400 mg twice daily for 1 year post transplant -he gets Rx from [facility]

## 2022-06-01 NOTE — Assessment & Plan Note (Signed)
Treatment as planned.

## 2022-06-01 NOTE — Assessment & Plan Note (Signed)
ANC is stable. monitor

## 2022-06-01 NOTE — Progress Notes (Signed)
Hematology/Oncology Progress note Telephone:([phone] Fax:([phone]) [phone]        CHIEF COMPLAINTS/REASON FOR VISIT:  Follow-up for multiple myeloma  ASSESSMENT & PLAN:   Cancer Staging  Multiple myeloma not having achieved remission ([place]) Staging form: Plasma Cell Myeloma and Plasma Cell Disorders, AJCC 8th Edition - Clinical stage from 02/04/2020: Beta-2-microglobulin (mg/L): 2.1, Albumin (g/dL): 2.7, ISS: Stage II, High-risk cytogenetics: Absent, LDH: Unknown - Signed by Tommy Server, MD on 05/06/2020   Multiple myeloma not having achieved remission ([place]) #IgG multiple myeloma, stage II, del 1 p, high risk.  Declined bone marrow transplant evaluation. 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 Labs are reviewed and discussed with patient. Proceed with this cycle of Revlimid 2m 3 weeks on 1 week off.   Zometa Q370month- April 2024   Chemotherapy-induced neuropathy (HCC) Grade 1, stable symptoms.  Observation  Chronic deep vein thrombosis (DVT) (HCC) Continue anticoagulation with Eliquis 20m49mID Vascular surgeon recommends no intervention due to lack of significant symptoms.    Drug-induced neutropenia (HCC) ANC is stable. monitor  Encounter for antineoplastic chemotherapy Treatment as planned.  [facility] for post marrow transplant vaccination. Continue acyclovir 400 mg twice daily for 1 year post transplant [place]  Procedures   CBC with Differential ([facility])    Standing Status:   Future    Standing Expiration Date:   06/02/2023   Multiple Myeloma Panel  (SPEP&IFE w/QIG)    Standing Status:   Future    Standing Expiration Date:   06/01/2023   Kappa/lambda light chains    Standing Status:   Future    Standing Expiration Date:   06/01/2023   CMP ([facility])    Standing Status:   Future    Standing Expiration Date:   06/02/2023    Lab MD  4 weeks   All questions were answered. The patient knows to call the clinic with any problems, questions or concerns.  [name], PhD [facility] 06/01/2022     HISTORY OF PRESENTING ILLNESS:   Tommy Smith a  [age]o.  male presents for follow-up of management of multiple myeloma Oncology history summary listed as below  Oncology History  Multiple [place]oma not having achieved remission ([facility]2021 Imaging   CT showed multiple bilateral pulmonary nodules measuring up to 10 mm in the right middle lobe, nodules are indeterminate, infectious/inflammatory etiology versus metastatic disease. Multiple osseous lytic lesions, largest lesion involving the left pubic bone concerning for metastatic disease.Compression fractures at T9, age indeterminate. Appearance focal area of thickening at the gastroesophageal junction.  Further evaluation with upper GI study or direct visualization with ndoscopy is recommended No bowel obstruction.Aortic atherosclerosis.   02/04/2020 Cancer Staging   Staging form: Plasma Cell Myeloma and Plasma Cell Disorders, AJCC 8th Edition - Clinical stage from 02/04/2020: Beta-2-microglobulin (mg/L): 2.1, Albumin (g/dL): 2.7, ISS: Stage II, High-risk cytogenetics: Absent, LDH: Unknown - Signed by Tommy Smith,Tommy ServerD on 05/06/2020   02/21/2020 Initial Diagnosis   Multiple myeloma  Baseline M protein 4.1, kappa light chain level 333.8 Beta-2 microglobulin 2.1, albumin 2.7.  -02/12/2020, bone  marrow biopsy showed hypercellular for age, 80% plasma cell which are complex restricted by light chain in situ heparinization.  Background hematopoiesis is  present but reduced. Cytogenetics is normal, Myeloma FISH panel-deletion 1P,Standard risk.   02/27/2020 - 05/06/2021 Chemotherapy   VRd     09/01/2020 Bone Marrow Biopsy   Bone marrow results were reviewed.  1% plasma cell.  Normal cytogenetics.  Myeloma FISH negative for 1p del.   04/21/2021 Bone Marrow Biopsy   Bone marrow after 14 cycles of VRD -[January 2023 ]showed plasma cells 2%.-   06/25/2021 Bone Marrow Transplant   Patient underwent autologous bone marrow transplant at [facility]   09/2021 -  Chemotherapy   Revlimid 10 mg 3 weeks on 1 week off   10/26/2021 Imaging     10/26/2021 Imaging   Bilateral lower extremity venous US Partially occlusive right deep vein thrombus extending from the calf veins into the common femoral vein with limited examination of the left extremity demonstrating partially occlusive thrombus in the common femoral vein.     01/13/2022 Imaging   Bilateral lower extremity venous US 1. 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 2. Extensive predominantly occlusive/near occlusive left lower extremity DVT extending from the left common femoral vein through the imaged tibial veins, age-indeterminate in the absence of prior examinations though echogenic thrombus within the left femoral vein suggests a chronic etiology.     01/20/2022 Bone Marrow Biopsy   -B[facility] biopsy at [facility] showed Cellular bone marrow aspirate with trilineage hematopoiesis and no morphologic evidence of plasma cell neoplasm.  -Negative minimal residual disease.    #Focal area of thickening at the GE junction. 10/23/2020 EGD is normal.   #Lung nodules, measures up to 10 mm on the right middle lobe.  Indeterminate.  Attention on  follow-up.  Recommend repeat CT chest and he declined  10/28/2011, unilateral right lower extremity ultrasound showed partially occlusive right deep vein thrombosis extending from the calf vein into the, femoral vein. Patient was recommended to start on Eliquis for anticoagulation.  He tolerates well.  Right thigh pain has improved.  INTERVAL HISTORY Keifer Hauter. is a 67 y.o. male who has above history reviewed by me today presents for follow up visit for multiple myeloma Patient is currently on Revlimid 10 mg 3 weeks on 1 week off for maintenance.. tolerates well. No new complaints.  Current everyday smoker, 10 cigarettes daily.   He follows  up with [facility] transplant team.  Patient denies fever chills.  Review of Systems  Constitutional:  Negative for appetite change, chills, fatigue, fever and unexpected weight change.  HENT:   Negative for hearing loss and voice change.   Eyes:  Negative for eye problems and icterus.  Respiratory:  Negative for chest tightness, cough and shortness of breath.   Cardiovascular:  Negative for chest pain and leg swelling.  Gastrointestinal:  Negative for abdominal distention, abdominal pain and constipation.  Endocrine: Negative for hot flashes.  Genitourinary:  Negative for difficulty urinating, dysuria and frequency.   Musculoskeletal:  Negative for arthralgias, back pain and neck pain.  Skin:  Negative for itching and rash.  Neurological:  Positive for numbness. Negative for light-headedness.  Hematological:  Negative for adenopathy. Does not bruise/bleed easily.  Psychiatric/Behavioral:  Negative for confusion.  MEDICAL HISTORY:  Past Medical History:  Diagnosis Date   Cancer ([place])    Depression    Hypercalcemia 03/02/2020   Hyperlipemia    Hypertension    Hypogonadism in male    Multiple myeloma [facility])     SURGICAL HISTORY: Past Surgical History:  Procedure Laterality Date   BONE MARROW BIOPSY     COLONOSCOPY  07/14/2008    ESOPHAGOGASTRODUODENOSCOPY N/A 10/22/2020   Procedure: ESOPHAGOGASTRODUODENOSCOPY (EGD);  Surgeon: Jonathon Bellows, MD;  Location: [facility] ENDOSCOPY;  Service: Gastroenterology;  Laterality: N/A;    SOCIAL HISTORY: Social History   Socioeconomic History   Marital status: Married    Spouse name: [name]   Number of children: 3   Years of education: Not on file   Highest education level: Not on file  Occupational History   Not on file  Tobacco Use   Smoking status: Every Day    Packs/day: 0.50    Years: 30.00    Total pack years: 15.00    Types: Cigarettes   Smokeless tobacco: Never  Substance and Sexual Activity   Alcohol use: Yes    Comment: occcassional   Drug use: No   Sexual activity: Not on file  Other Topics Concern   Not on file  Social History Narrative   Not on file   Social Determinants of Health   Financial Resource Strain: Not on file  Food Insecurity: Not on file  Transportation Needs: Not on file  Physical Activity: Not on file  Stress: Not on file  Social Connections: Not on file  Intimate Partner Violence: Not on file    FAMILY HISTORY: Family History  Problem Relation Age of Onset   Skin cancer Mother    Prostate cancer Father     ALLERGIES:  has No Known Allergies.  MEDICATIONS:  Current Outpatient Medications  Medication Sig Dispense Refill   acyclovir (ZOVIRAX) 400 MG tablet TAKE 1 TABLET BY MOUTH TWICE A DAY 180 tablet 1   apixaban (ELIQUIS) 5 MG TABS tablet Take 1 tablet (5 mg total) by mouth 2 (two) times daily. 60 tablet 2   buPROPion (WELLBUTRIN XL) 300 MG 24 hr tablet Take 300 mg by mouth daily.     calcium carbonate (OS-CAL) 600 MG tablet Take 3 tablets (1,800 mg total) by mouth daily. 30 tablet 0   cholecalciferol (VITAMIN D3) 25 MCG (1000 UNIT) tablet Take 2,000 Units by mouth daily.     loperamide (IMODIUM) 2 MG capsule Take by mouth.     sertraline (ZOLOFT) 25 MG tablet Take 1 tablet by mouth daily.     lenalidomide (REVLIMID) 10 MG  capsule Take 1 capsule (10 mg total) by mouth daily. Take for 21 days, then hold for 7 days. Repeat every 28 days. 21 capsule 0   No current facility-administered medications for this visit.     PHYSICAL EXAMINATION: ECOG PERFORMANCE STATUS: 1 - Symptomatic but completely ambulatory Vitals:   06/01/22 1346  BP: (!) 134/90  Pulse: 88  Resp: 18  Temp: 97.7 F (36.5 C)   Filed Weights   06/01/22 1346  Weight: 225 lb 3.2 oz (102.2 kg)    Physical Exam Constitutional:      General: He is not in acute distress.    Appearance: He is not diaphoretic.  HENT:     Head: Normocephalic and atraumatic.     Nose: Nose normal.     Mouth/Throat:     Pharynx: No oropharyngeal exudate.  Eyes:  General: No scleral icterus.    Pupils: Pupils are equal, round, and reactive to light.  Cardiovascular:     Rate and Rhythm: Normal rate.     Heart sounds: Murmur heard.  Pulmonary:     Effort: Pulmonary effort is normal. No respiratory distress.     Breath sounds: No rales.  Chest:     Chest wall: No tenderness.  Abdominal:     General: There is no distension.     Palpations: Abdomen is soft.     Tenderness: There is no abdominal tenderness.  Musculoskeletal:        General: Normal range of motion.     Cervical back: Normal range of motion.  Skin:    General: Skin is warm and dry.     Findings: No erythema.  Neurological:     Mental Status: He is alert and oriented to person, place, and time. Mental status is at baseline.     Cranial Nerves: No cranial nerve deficit.     Motor: No abnormal muscle tone.     Coordination: Coordination normal.  Psychiatric:        Mood and Affect: Mood and affect normal.       LABORATORY DATA:  I have reviewed the data as listed    Latest Ref Rng & Units 06/01/2022    1:34 PM 05/04/2022   12:50 PM 04/05/2022    7:57 AM  CBC  WBC 4.0 - 10.5 K/uL 3.5  3.5  3.2   Hemoglobin 13.0 - 17.0 g/dL 14.7  13.7  13.6   Hematocrit 39.0 - 52.0 % 45.2   43.6  40.8   Platelets 150 - 400 K/uL 152  161  134       Latest Ref Rng & Units 06/01/2022    1:34 PM 05/04/2022   12:50 PM 03/29/2022   12:39 PM  CMP  Glucose 70 - 99 mg/dL 76  89  81   BUN 8 - 23 mg/dL 11  17  16   $ Creatinine 0.61 - 1.24 mg/dL 1.14  1.10  1.13   Sodium 135 - 145 mmol/L 138  138  139   Potassium 3.5 - 5.1 mmol/L 3.4  3.9  3.7   Chloride 98 - 111 mmol/L 104  105  106   CO2 22 - 32 mmol/L 25  24  26   $ Calcium 8.9 - 10.3 mg/dL 9.0  9.3  9.2   Total Protein 6.5 - 8.1 g/dL 7.0  7.0  7.0   Total Bilirubin 0.3 - 1.2 mg/dL 1.0  1.1  0.7   Alkaline Phos 38 - 126 U/L 57  52  47   AST 15 - 41 U/L 16  16  18   $ ALT 0 - 44 U/L 15  16  20     $ RADIOGRAPHIC STUDIES: I have personally reviewed the radiological images as listed and agreed with the findings in the report. No results found.

## 2022-06-02 LAB — KAPPA/LAMBDA LIGHT CHAINS
Kappa free light chain: 13.8 mg/L (ref 3.3–19.4)
Kappa, lambda light chain ratio: 1.33 (ref 0.26–1.65)
Lambda free light chains: 10.4 mg/L (ref 5.7–26.3)

## 2022-06-06 LAB — MULTIPLE MYELOMA PANEL, SERUM
Albumin SerPl Elph-Mcnc: 4.1 g/dL (ref 2.9–4.4)
Albumin/Glob SerPl: 1.8 — ABNORMAL HIGH (ref 0.7–1.7)
Alpha 1: 0.2 g/dL (ref 0.0–0.4)
Alpha2 Glob SerPl Elph-Mcnc: 0.7 g/dL (ref 0.4–1.0)
B-Globulin SerPl Elph-Mcnc: 0.8 g/dL (ref 0.7–1.3)
Gamma Glob SerPl Elph-Mcnc: 0.5 g/dL (ref 0.4–1.8)
Globulin, Total: 2.3 g/dL (ref 2.2–3.9)
IgA: 212 mg/dL (ref 61–437)
IgG (Immunoglobin G), Serum: 637 mg/dL (ref 603–1613)
IgM (Immunoglobulin M), Srm: 9 mg/dL — ABNORMAL LOW (ref 20–172)
Total Protein ELP: 6.4 g/dL (ref 6.0–8.5)

## 2022-06-29 ENCOUNTER — Inpatient Hospital Stay: Payer: Medicare Other | Attending: Oncology

## 2022-06-29 ENCOUNTER — Inpatient Hospital Stay: Payer: Medicare Other | Admitting: Oncology

## 2022-06-29 ENCOUNTER — Encounter: Payer: Self-pay | Admitting: Oncology

## 2022-06-29 VITALS — BP 116/78 | HR 72 | Temp 98.3°F | Resp 15 | Wt 227.8 lb

## 2022-06-29 DIAGNOSIS — Z8042 Family history of malignant neoplasm of prostate: Secondary | ICD-10-CM | POA: Insufficient documentation

## 2022-06-29 DIAGNOSIS — I825Y1 Chronic embolism and thrombosis of unspecified deep veins of right proximal lower extremity: Secondary | ICD-10-CM

## 2022-06-29 DIAGNOSIS — Z9481 Bone marrow transplant status: Secondary | ICD-10-CM

## 2022-06-29 DIAGNOSIS — G620 Drug-induced polyneuropathy: Secondary | ICD-10-CM | POA: Insufficient documentation

## 2022-06-29 DIAGNOSIS — Z5111 Encounter for antineoplastic chemotherapy: Secondary | ICD-10-CM

## 2022-06-29 DIAGNOSIS — T451X5A Adverse effect of antineoplastic and immunosuppressive drugs, initial encounter: Secondary | ICD-10-CM

## 2022-06-29 DIAGNOSIS — Z7901 Long term (current) use of anticoagulants: Secondary | ICD-10-CM | POA: Diagnosis not present

## 2022-06-29 DIAGNOSIS — C9000 Multiple myeloma not having achieved remission: Secondary | ICD-10-CM

## 2022-06-29 DIAGNOSIS — D702 Other drug-induced agranulocytosis: Secondary | ICD-10-CM | POA: Insufficient documentation

## 2022-06-29 DIAGNOSIS — E876 Hypokalemia: Secondary | ICD-10-CM | POA: Insufficient documentation

## 2022-06-29 DIAGNOSIS — I82513 Chronic embolism and thrombosis of femoral vein, bilateral: Secondary | ICD-10-CM | POA: Insufficient documentation

## 2022-06-29 DIAGNOSIS — Z808 Family history of malignant neoplasm of other organs or systems: Secondary | ICD-10-CM | POA: Insufficient documentation

## 2022-06-29 DIAGNOSIS — I10 Essential (primary) hypertension: Secondary | ICD-10-CM | POA: Diagnosis not present

## 2022-06-29 DIAGNOSIS — F17210 Nicotine dependence, cigarettes, uncomplicated: Secondary | ICD-10-CM | POA: Diagnosis not present

## 2022-06-29 DIAGNOSIS — Z79899 Other long term (current) drug therapy: Secondary | ICD-10-CM | POA: Insufficient documentation

## 2022-06-29 LAB — CBC WITH DIFFERENTIAL (CANCER CENTER ONLY)
Abs Immature Granulocytes: 0 10*3/uL (ref 0.00–0.07)
Basophils Absolute: 0 10*3/uL (ref 0.0–0.1)
Basophils Relative: 1 %
Eosinophils Absolute: 0.2 10*3/uL (ref 0.0–0.5)
Eosinophils Relative: 6 %
HCT: 41.2 % (ref 39.0–52.0)
Hemoglobin: 13.5 g/dL (ref 13.0–17.0)
Immature Granulocytes: 0 %
Lymphocytes Relative: 40 %
Lymphs Abs: 1.3 10*3/uL (ref 0.7–4.0)
MCH: 30.9 pg (ref 26.0–34.0)
MCHC: 32.8 g/dL (ref 30.0–36.0)
MCV: 94.3 fL (ref 80.0–100.0)
Monocytes Absolute: 0.3 10*3/uL (ref 0.1–1.0)
Monocytes Relative: 10 %
Neutro Abs: 1.3 10*3/uL — ABNORMAL LOW (ref 1.7–7.7)
Neutrophils Relative %: 43 %
Platelet Count: 140 10*3/uL — ABNORMAL LOW (ref 150–400)
RBC: 4.37 MIL/uL (ref 4.22–5.81)
RDW: 16.2 % — ABNORMAL HIGH (ref 11.5–15.5)
WBC Count: 3.1 10*3/uL — ABNORMAL LOW (ref 4.0–10.5)
nRBC: 0 % (ref 0.0–0.2)

## 2022-06-29 LAB — CMP (CANCER CENTER ONLY)
ALT: 17 U/L (ref 0–44)
AST: 19 U/L (ref 15–41)
Albumin: 4 g/dL (ref 3.5–5.0)
Alkaline Phosphatase: 51 U/L (ref 38–126)
Anion gap: 7 (ref 5–15)
BUN: 16 mg/dL (ref 8–23)
CO2: 26 mmol/L (ref 22–32)
Calcium: 8.8 mg/dL — ABNORMAL LOW (ref 8.9–10.3)
Chloride: 105 mmol/L (ref 98–111)
Creatinine: 1.23 mg/dL (ref 0.61–1.24)
GFR, Estimated: >60 mL/min (ref >60)
Glucose, Bld: 80 mg/dL (ref 70–99)
Potassium: 3.3 mmol/L — ABNORMAL LOW (ref 3.5–5.1)
Sodium: 138 mmol/L (ref 135–145)
Total Bilirubin: 1.1 mg/dL (ref 0.3–1.2)
Total Protein: 6.6 g/dL (ref 6.5–8.1)

## 2022-06-29 MED ORDER — CALCIUM 600 MG PO TABS: 1200.0000 mg | ORAL_TABLET | Freq: Every day | ORAL | 0 refills | Status: DC

## 2022-06-29 MED ORDER — APIXABAN 5 MG PO TABS: 5.0000 mg | ORAL_TABLET | Freq: Two times a day (BID) | ORAL | 2 refills | Status: DC

## 2022-06-29 MED ORDER — LENALIDOMIDE 10 MG PO CAPS: 10.0000 mg | ORAL_CAPSULE | Freq: Every day | ORAL | 0 refills | Status: DC

## 2022-06-29 MED ORDER — POTASSIUM CHLORIDE CRYS ER 10 MEQ PO TBCR: 10.0000 meq | EXTENDED_RELEASE_TABLET | Freq: Every day | ORAL | 0 refills | Status: DC

## 2022-06-29 NOTE — Assessment & Plan Note (Signed)
Continue anticoagulation with Eliquis 5mg  BID Vascular surgeon recommends no intervention due to lack of significant symptoms.  Patient declined repeat lower extremity ultrasound for follow-up of DVT

## 2022-06-29 NOTE — Assessment & Plan Note (Signed)
#  IgG multiple myeloma, stage II, del 1 p, high risk.  Declined bone marrow transplant evaluation. 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 Labs are reviewed and discussed with patient. Proceed with this cycle of Revlimid 10mg 3 weeks on 1 week off.   Zometa Q3months - April 2024  

## 2022-06-29 NOTE — Assessment & Plan Note (Signed)
Grade 1, stable symptoms.  Observation 

## 2022-06-29 NOTE — Assessment & Plan Note (Signed)
Patient follows up with oncology for post marrow transplant vaccination. Continue acyclovir 400 mg twice daily for 1 year post transplant -he gets Rx from [facility] 

## 2022-06-29 NOTE — Assessment & Plan Note (Signed)
Treatment as planned 

## 2022-06-29 NOTE — Assessment & Plan Note (Signed)
ANC is slightly decreased, overall stable. monitor

## 2022-06-29 NOTE — Progress Notes (Addendum)
Hematology/Oncology Progress note Telephone:([phone]) [phone] Fax:([phone]        CHIEF COMPLAINTS/REASON FOR VISIT:  Follow-up for multiple myeloma  ASSESSMENT & PLAN:   Cancer Staging  Multiple myeloma not having achieved remission (Tommy Smith) Staging form: Plasma Cell Myeloma and Plasma Cell Disorders, AJCC 8th Edition - Clinical stage from 02/04/2020: Beta-2-microglobulin (mg/L): 2.1, Albumin (g/dL): 2.7, ISS: Stage II, High-risk cytogenetics: Absent, LDH: Unknown - Signed by Earlie Server, MD on 05/06/2020   Multiple myeloma not having achieved remission (Tommy Smith) #IgG multiple myeloma, stage II, del 1 p, high risk.  Declined bone marrow transplant evaluation. 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 Labs are reviewed and discussed with patient. Proceed with this cycle of Revlimid 10mg  3 weeks on 1 week off.   Zometa Q10months - April 2024   Chemotherapy-induced neuropathy (HCC) Grade 1, stable symptoms.  Observation  Chronic deep vein thrombosis (DVT) (HCC) Continue anticoagulation with Eliquis 5mg  BID Vascular surgeon recommends no intervention due to lack of significant symptoms.  Patient declined repeat lower extremity ultrasound for follow-up of DVT   Drug-induced neutropenia (HCC) ANC is slightly decreased, overall stable. monitor  Encounter for antineoplastic chemotherapy Treatment as planned.  History of bone marrow transplant [facility]) Patient follows up with oncology for post marrow transplant vaccination. Continue acyclovir 400 mg twice daily for 1 year post transplant -he gets Rx from [facility]  Hypokalemia Recommend patient to resume potassium chloride 10 meq daily.  Patient will use  home supply.  Monitor counts   Orders Placed This Encounter  Procedures   CBC with Differential ([place] Only)    Standing Status:   Future    Standing Expiration Date:   06/29/2023   CMP ([place] only)    Standing Status:   Future    Standing Expiration Date:   06/29/2023   Multiple Myeloma Panel (SPEP&IFE w/QIG)    Standing Status:   Future    Standing Expiration Date:   06/29/2023   Kappa/lambda light chains    Standing Status:   Future    Standing Expiration Date:   06/29/2023   Ambulatory referral to Social Work    Referral Priority:   Routine    Referral Type:   Consultation    Referral Reason:   Specialty Services Required    Number of Visits Requested:   1    Lab MD  4 weeks   All questions were answered. The patient knows to call the clinic with any problems, questions or concerns.  Earlie Server, MD, PhD [facility] Hematology Oncology 06/29/2022     HISTORY OF PRESENTING ILLNESS:   Tommy Smith. is a  67 y.o.  male presents for follow-up of management of multiple myeloma Oncology history summary listed as below  Oncology History  Multiple myeloma not having achieved remission (Tommy Smith)  01/29/2020 Imaging   CT showed multiple bilateral pulmonary nodules measuring up to 10 mm in the right middle lobe, nodules are indeterminate, infectious/inflammatory etiology versus metastatic disease. Multiple osseous lytic lesions, largest lesion involving the left pubic bone concerning for metastatic disease.Compression fractures at T9, age indeterminate. Appearance focal area of thickening at the gastroesophageal junction.  Further evaluation with upper GI study or direct visualization with ndoscopy is recommended No  bowel obstruction.Aortic atherosclerosis.   02/04/2020 Cancer Staging   Staging form: Plasma Cell Myeloma and Plasma Cell Disorders, AJCC 8th Edition - Clinical stage from 02/04/2020: Beta-2-microglobulin (mg/L): 2.1, Albumin (g/dL): 2.7, ISS: Stage II,  High-risk cytogenetics: Absent, LDH: Unknown - Signed by Earlie Server, MD on 05/06/2020   02/21/2020 Initial Diagnosis   Multiple myeloma  Baseline M protein 4.1, kappa light chain level 333.8 Beta-2 microglobulin 2.1, albumin 2.7.  -02/12/2020, bone marrow biopsy showed hypercellular for age, 80% plasma cell which are complex restricted by light chain in situ heparinization.  Background hematopoiesis is present but reduced. Cytogenetics is normal, Myeloma FISH panel-deletion 1P,Standard risk.   02/27/2020 - 05/06/2021 Chemotherapy   VRd     09/01/2020 Bone Marrow Biopsy   Bone marrow results were reviewed.  1% plasma cell.  Normal cytogenetics.  Myeloma FISH negative for 1p del.   04/21/2021 Bone Marrow Biopsy   Bone marrow after 14 cycles of VRD -[January 2023 ]showed plasma cells 2%.-   06/25/2021 Bone Marrow Transplant   Patient underwent autologous bone marrow transplant at [facility]   09/2021 -  Chemotherapy   Revlimid 10 mg 3 weeks on 1 week off   10/26/2021 Imaging     10/26/2021 Imaging   Bilateral lower extremity venous US Partially occlusive right deep vein thrombus extending from the calf veins into the common femoral vein with limited examination of the left extremity demonstrating partially occlusive thrombus in the common femoral vein.     01/13/2022 Imaging   Bilateral lower extremity venous US 1. 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 2. Extensive predominantly occlusive/near occlusive left lower extremity DVT extending from the left common femoral vein through the imaged tibial veins, age-indeterminate in the absence of prior examinations though echogenic thrombus within the left femoral vein suggests a chronic  etiology.     01/20/2022 Bone Marrow Biopsy   -Bone marrow biopsy at [facility] showed Cellular bone marrow aspirate with trilineage hematopoiesis and no morphologic evidence of plasma cell neoplasm.  -Negative minimal residual disease.    #Focal area of thickening at the GE junction. 10/23/2020 EGD is normal.   #Lung nodules, measures up to 10 mm on the right middle lobe.  Indeterminate.  Attention on follow-up.  Recommend repeat CT chest and he declined  10/28/2011, unilateral right lower extremity ultrasound showed partially occlusive right deep vein thrombosis extending from the calf vein into the, femoral vein. Patient was recommended to start on Eliquis for anticoagulation.  He tolerates well.  Right thigh pain has improved.  INTERVAL HISTORY Tequan Seavers. is a 67 y.o. male who has above history reviewed by me today presents for follow up visit for multiple myeloma Patient is currently on Revlimid 10 mg 3 weeks on 1 week off for maintenance.. Current everyday smoker, 10 cigarettes daily.   He follows  up with [facility] transplant team.  Patient denies fever chills.  No new complaints.  Review of Systems  Constitutional:  Negative for appetite change, chills, fatigue, fever and unexpected weight change.  HENT:   Negative for hearing loss and voice change.   Eyes:  Negative for eye problems and icterus.  Respiratory:  Negative for chest tightness, cough and shortness of breath.   Cardiovascular:  Negative for chest pain and leg swelling.  Gastrointestinal:  Negative for abdominal distention, abdominal pain and constipation.  Endocrine: Negative for hot flashes.  Genitourinary:  Negative for difficulty urinating, dysuria and frequency.   Musculoskeletal:  Negative for arthralgias, back pain and neck pain.  Skin:  Negative for itching and rash.  Neurological:  Positive for numbness. Negative for light-headedness.  Hematological:  Negative for adenopathy. Does not bruise/bleed easily.   Psychiatric/Behavioral:  Negative for confusion.      MEDICAL HISTORY:  Past Medical History:  Diagnosis Date   Cancer ([place])    Depression    Hypercalcemia 03/02/2020   Hyperlipemia    Hypertension    Hypogonadism in male    Multiple myeloma [facility])     SURGICAL HISTORY: Past Surgical History:  Procedure Laterality Date   BONE MARROW BIOPSY     COLONOSCOPY  07/14/2008   ESOPHAGOGASTRODUODENOSCOPY N/A 10/22/2020   Procedure: ESOPHAGOGASTRODUODENOSCOPY (EGD);  Surgeon: Jonathon Bellows, MD;  Location: [facility] ENDOSCOPY;  Service: Gastroenterology;  Laterality: N/A;    SOCIAL HISTORY: Social History   Socioeconomic History   Marital status: Married    Spouse name: [name]   Number of children: 3   Years of education: Not on file   Highest education level: Not on file  Occupational History   Not on file  Tobacco Use   Smoking status: Every Day    Packs/day: 0.50    Years: 30.00    Additional pack years: 0.00    Total pack years: 15.00    Types: Cigarettes   Smokeless tobacco: Never  Substance and Sexual Activity   Alcohol use: Yes    Comment: occcassional   Drug use: No   Sexual activity: Not on file  Other Topics Concern   Not on file  Social History Narrative   Not on file   Social Determinants of Health   Financial Resource Strain: Not on file  Food Insecurity: Not on file  Transportation Needs: Not on file  Physical Activity: Not on file  Stress: Not on file  Social Connections: Not on file  Intimate Partner Violence: Not on file    FAMILY HISTORY: Family History  Problem Relation Age of Onset   Skin cancer Mother    Prostate cancer Father     ALLERGIES:  has No Known Allergies.  MEDICATIONS:  Current Outpatient Medications  Medication Sig Dispense Refill   acyclovir (ZOVIRAX) 400 MG tablet TAKE 1 TABLET BY MOUTH TWICE A DAY 180 tablet 1   buPROPion (WELLBUTRIN XL) 300 MG 24 hr tablet Take 300 mg by mouth daily.     cholecalciferol (VITAMIN D3)  25 MCG (1000 UNIT) tablet Take 2,000 Units by mouth daily.     loperamide (IMODIUM) 2 MG capsule Take by mouth.     potassium chloride (KLOR-CON M) 10 MEQ tablet Take 1 tablet (10 mEq total) by mouth daily. 30 tablet 0   sertraline (ZOLOFT) 25 MG tablet Take 1 tablet by mouth daily.     apixaban (ELIQUIS) 5 MG TABS tablet Take 1 tablet (5 mg total) by mouth 2 (two) times daily. 60 tablet 2   calcium carbonate (OS-CAL) 600 MG tablet Take 2 tablets (1,200 mg total) by mouth daily. 30 tablet 0   lenalidomide (REVLIMID) 10 MG capsule Take 1 capsule (10 mg total) by mouth daily. Take for 21 days, then hold for 7 days. Repeat every 28 days. 21 capsule 0   No current facility-administered medications for this visit.     PHYSICAL EXAMINATION: ECOG PERFORMANCE STATUS: 1 - Symptomatic  but completely ambulatory Vitals:   06/29/22 1404  BP: 116/78  Pulse: 72  Resp: 15  Temp: 98.3 F (36.8 C)  SpO2: 100%   Filed Weights   06/29/22 1404  Weight: 227 lb 12.8 oz (103.3 kg)    Physical Exam Constitutional:      General: He is not in acute distress.    Appearance: He is not diaphoretic.  HENT:     Head: Normocephalic and atraumatic.     Nose: Nose normal.     Mouth/Throat:     Pharynx: No oropharyngeal exudate.  Eyes:     General: No scleral icterus.    Pupils: Pupils are equal, round, and reactive to light.  Cardiovascular:     Rate and Rhythm: Normal rate.     Heart sounds: Murmur heard.  Pulmonary:     Effort: Pulmonary effort is normal. No respiratory distress.     Breath sounds: No rales.  Chest:     Chest wall: No tenderness.  Abdominal:     General: There is no distension.     Palpations: Abdomen is soft.     Tenderness: There is no abdominal tenderness.  Musculoskeletal:        General: Normal range of motion.     Cervical back: Normal range of motion.  Skin:    General: Skin is warm and dry.     Findings: No erythema.  Neurological:     Mental Status: He is alert and  oriented to person, place, and time. Mental status is at baseline.     Cranial Nerves: No cranial nerve deficit.     Motor: No abnormal muscle tone.     Coordination: Coordination normal.  Psychiatric:        Mood and Affect: Mood and affect normal.       LABORATORY DATA:  I have reviewed the data as listed    Latest Ref Rng & Units 06/29/2022    1:33 PM 06/01/2022    1:34 PM 05/04/2022   12:50 PM  CBC  WBC 4.0 - 10.5 K/uL 3.1  3.5  3.5   Hemoglobin 13.0 - 17.0 g/dL 13.5  14.7  13.7   Hematocrit 39.0 - 52.0 % 41.2  45.2  43.6   Platelets 150 - 400 K/uL 140  152  161       Latest Ref Rng & Units 06/29/2022    1:33 PM 06/01/2022    1:34 PM 05/04/2022   12:50 PM  CMP  Glucose 70 - 99 mg/dL 80  76  89   BUN 8 - 23 mg/dL 16  11  17    Creatinine 0.61 - 1.24 mg/dL 1.23  1.14  1.10   Sodium 135 - 145 mmol/L 138  138  138   Potassium 3.5 - 5.1 mmol/L 3.3  3.4  3.9   Chloride 98 - 111 mmol/L 105  104  105   CO2 22 - 32 mmol/L 26  25  24    Calcium 8.9 - 10.3 mg/dL 8.8  9.0  9.3   Total Protein 6.5 - 8.1 g/dL 6.6  7.0  7.0   Total Bilirubin 0.3 - 1.2 mg/dL 1.1  1.0  1.1   Alkaline Phos 38 - 126 U/L 51  57  52   AST 15 - 41 U/L 19  16  16    ALT 0 - 44 U/L 17  15  16      RADIOGRAPHIC STUDIES: I have personally reviewed the radiological images as  listed and agreed with the findings in the report. No results found.

## 2022-06-29 NOTE — Assessment & Plan Note (Signed)
Recommend patient to resume potassium chloride 10 meq daily.  Patient will use home supply.  Monitor counts

## 2022-06-30 LAB — KAPPA/LAMBDA LIGHT CHAINS
Kappa free light chain: 15.5 mg/L (ref 3.3–19.4)
Kappa, lambda light chain ratio: 1.6 (ref 0.26–1.65)
Lambda free light chains: 9.7 mg/L (ref 5.7–26.3)

## 2022-07-04 ENCOUNTER — Encounter: Payer: Self-pay | Admitting: Licensed Clinical Social Worker

## 2022-07-04 NOTE — Progress Notes (Signed)
[place] Work  Clinical Social Work was referred by medical provider for assessment of psychosocial needs.  Clinical Social Worker attempted to contact patient by phone  to offer support and assess for needs.  CSW was unable to leave message on patient's mobile phone, voicemail not set up.  CSW left a voicemail with contact information with request for return call.   FA   Adelene Amas, LCSW  Clinical Social Worker [facility]

## 2022-07-05 LAB — MULTIPLE MYELOMA PANEL, SERUM
Albumin SerPl Elph-Mcnc: 3.4 g/dL (ref 2.9–4.4)
Albumin/Glob SerPl: 1.5 (ref 0.7–1.7)
Alpha 1: 0.2 g/dL (ref 0.0–0.4)
Alpha2 Glob SerPl Elph-Mcnc: 0.7 g/dL (ref 0.4–1.0)
B-Globulin SerPl Elph-Mcnc: 0.9 g/dL (ref 0.7–1.3)
Gamma Glob SerPl Elph-Mcnc: 0.5 g/dL (ref 0.4–1.8)
Globulin, Total: 2.3 g/dL (ref 2.2–3.9)
IgA: 202 mg/dL (ref 61–437)
IgG (Immunoglobin G), Serum: 539 mg/dL — ABNORMAL LOW (ref 603–1613)
IgM (Immunoglobulin M), Srm: 7 mg/dL — ABNORMAL LOW (ref 20–172)
Total Protein ELP: 5.7 g/dL — ABNORMAL LOW (ref 6.0–8.5)

## 2022-07-14 ENCOUNTER — Encounter: Payer: Self-pay | Admitting: Oncology

## 2022-07-15 ENCOUNTER — Encounter: Payer: Self-pay | Admitting: Licensed Clinical Social Worker

## 2022-07-15 NOTE — Progress Notes (Signed)
[facility] Clinical Social Work  Clinical Social Work was referred by medical provider for assessment of psychosocial needs.  Clinical Social Worker attempted to contact patient by phone  to offer support and assess for needs.  CSW left voicemail with contact information and request for return call.   SA    Jennise Both, LCSW  Clinical Social Worker [facility]          

## 2022-07-19 ENCOUNTER — Other Ambulatory Visit: Payer: Self-pay

## 2022-07-19 DIAGNOSIS — C9000 Multiple myeloma not having achieved remission: Secondary | ICD-10-CM

## 2022-07-19 MED ORDER — LENALIDOMIDE 10 MG PO CAPS: 10.0000 mg | ORAL_CAPSULE | Freq: Every day | ORAL | 0 refills | Status: DC

## 2022-07-22 ENCOUNTER — Encounter: Payer: Self-pay | Admitting: Licensed Clinical Social Worker

## 2022-07-22 NOTE — Progress Notes (Signed)
[facility] Clinical Social Work  Clinical Social Work was referred by medical provider for assessment of psychosocial needs.  Clinical Social Worker attempted to contact patient by phone  to offer support and assess for needs.  CSW left voicemail with contact information and request for return call.    TA   Valor Quaintance, LCSW  Clinical Social Worker [facility]          

## 2022-07-26 ENCOUNTER — Encounter: Payer: Self-pay | Admitting: Licensed Clinical Social Worker

## 2022-07-26 NOTE — Progress Notes (Signed)
[facility] Clinical Social Work  Clinical Social Work was referred by medical provider for assessment of psychosocial needs.  Clinical Social Worker contacted patient by phone  to offer support and assess for needs.  CSW called and was unable leave voicemail.     Joseph Art, LCSW  Clinical Social Worker [facility]

## 2022-07-27 ENCOUNTER — Encounter: Payer: Self-pay | Admitting: Oncology

## 2022-07-27 ENCOUNTER — Inpatient Hospital Stay: Payer: Medicare Other | Attending: Oncology

## 2022-07-27 ENCOUNTER — Inpatient Hospital Stay: Payer: Medicare Other

## 2022-07-27 ENCOUNTER — Inpatient Hospital Stay: Payer: Medicare Other | Admitting: Oncology

## 2022-07-27 VITALS — BP 127/91 | HR 64 | Temp 96.6°F | Resp 18 | Wt 224.2 lb

## 2022-07-27 DIAGNOSIS — G620 Drug-induced polyneuropathy: Secondary | ICD-10-CM | POA: Diagnosis not present

## 2022-07-27 DIAGNOSIS — F17210 Nicotine dependence, cigarettes, uncomplicated: Secondary | ICD-10-CM | POA: Diagnosis not present

## 2022-07-27 DIAGNOSIS — D701 Agranulocytosis secondary to cancer chemotherapy: Secondary | ICD-10-CM | POA: Insufficient documentation

## 2022-07-27 DIAGNOSIS — Z7961 Long term (current) use of immunomodulator: Secondary | ICD-10-CM | POA: Diagnosis not present

## 2022-07-27 DIAGNOSIS — T451X5A Adverse effect of antineoplastic and immunosuppressive drugs, initial encounter: Secondary | ICD-10-CM | POA: Insufficient documentation

## 2022-07-27 DIAGNOSIS — C9000 Multiple myeloma not having achieved remission: Secondary | ICD-10-CM

## 2022-07-27 DIAGNOSIS — Z79624 Long term (current) use of inhibitors of nucleotide synthesis: Secondary | ICD-10-CM | POA: Diagnosis not present

## 2022-07-27 DIAGNOSIS — Z79899 Other long term (current) drug therapy: Secondary | ICD-10-CM | POA: Insufficient documentation

## 2022-07-27 DIAGNOSIS — Z9481 Bone marrow transplant status: Secondary | ICD-10-CM

## 2022-07-27 DIAGNOSIS — D702 Other drug-induced agranulocytosis: Secondary | ICD-10-CM

## 2022-07-27 DIAGNOSIS — Z86718 Personal history of other venous thrombosis and embolism: Secondary | ICD-10-CM | POA: Insufficient documentation

## 2022-07-27 DIAGNOSIS — E876 Hypokalemia: Secondary | ICD-10-CM | POA: Insufficient documentation

## 2022-07-27 DIAGNOSIS — Z5111 Encounter for antineoplastic chemotherapy: Secondary | ICD-10-CM

## 2022-07-27 DIAGNOSIS — I825Y1 Chronic embolism and thrombosis of unspecified deep veins of right proximal lower extremity: Secondary | ICD-10-CM

## 2022-07-27 DIAGNOSIS — Z7901 Long term (current) use of anticoagulants: Secondary | ICD-10-CM | POA: Insufficient documentation

## 2022-07-27 LAB — CBC WITH DIFFERENTIAL (CANCER CENTER ONLY)
Abs Immature Granulocytes: 0.01 10*3/uL (ref 0.00–0.07)
Basophils Absolute: 0 10*3/uL (ref 0.0–0.1)
Basophils Relative: 0 %
Eosinophils Absolute: 0.1 10*3/uL (ref 0.0–0.5)
Eosinophils Relative: 3 %
HCT: 40.3 % (ref 39.0–52.0)
Hemoglobin: 13 g/dL (ref 13.0–17.0)
Immature Granulocytes: 0 %
Lymphocytes Relative: 45 %
Lymphs Abs: 1.4 10*3/uL (ref 0.7–4.0)
MCH: 30.4 pg (ref 26.0–34.0)
MCHC: 32.3 g/dL (ref 30.0–36.0)
MCV: 94.2 fL (ref 80.0–100.0)
Monocytes Absolute: 0.3 10*3/uL (ref 0.1–1.0)
Monocytes Relative: 10 %
Neutro Abs: 1.3 10*3/uL — ABNORMAL LOW (ref 1.7–7.7)
Neutrophils Relative %: 42 %
Platelet Count: 172 10*3/uL (ref 150–400)
RBC: 4.28 MIL/uL (ref 4.22–5.81)
RDW: 16 % — ABNORMAL HIGH (ref 11.5–15.5)
WBC Count: 3.1 10*3/uL — ABNORMAL LOW (ref 4.0–10.5)
nRBC: 0 % (ref 0.0–0.2)

## 2022-07-27 LAB — CMP (CANCER CENTER ONLY)
ALT: 18 U/L (ref 0–44)
AST: 19 U/L (ref 15–41)
Albumin: 3.8 g/dL (ref 3.5–5.0)
Alkaline Phosphatase: 66 U/L (ref 38–126)
Anion gap: 8 (ref 5–15)
BUN: 12 mg/dL (ref 8–23)
CO2: 26 mmol/L (ref 22–32)
Calcium: 9 mg/dL (ref 8.9–10.3)
Chloride: 103 mmol/L (ref 98–111)
Creatinine: 1.11 mg/dL (ref 0.61–1.24)
GFR, Estimated: >60 mL/min (ref >60)
Glucose, Bld: 83 mg/dL (ref 70–99)
Potassium: 2.9 mmol/L — ABNORMAL LOW (ref 3.5–5.1)
Sodium: 137 mmol/L (ref 135–145)
Total Bilirubin: 0.9 mg/dL (ref 0.3–1.2)
Total Protein: 6.4 g/dL — ABNORMAL LOW (ref 6.5–8.1)

## 2022-07-27 MED ORDER — SODIUM CHLORIDE 0.9 % IV SOLN
INTRAVENOUS | Status: DC
  Filled 2022-07-27 (×2): qty 250

## 2022-07-27 MED ORDER — POTASSIUM CHLORIDE CRYS ER 20 MEQ PO TBCR: 20.0000 meq | EXTENDED_RELEASE_TABLET | Freq: Every day | ORAL | 1 refills | Status: DC

## 2022-07-27 MED ORDER — ZOLEDRONIC ACID 4 MG/100ML IV SOLN
4.0000 mg | Freq: Once | INTRAVENOUS | Status: AC
  Administered 2022-07-27: 4 mg via INTRAVENOUS
  Filled 2022-07-27: qty 100

## 2022-07-27 MED ORDER — LENALIDOMIDE 10 MG PO CAPS
10.0000 mg | ORAL_CAPSULE | Freq: Every day | ORAL | 0 refills | Status: DC
Start: 2022-07-27 — End: 2022-08-12

## 2022-07-27 NOTE — Assessment & Plan Note (Addendum)
#  IgG multiple myeloma, stage II, del 1 p, high risk.  Declined bone marrow transplant evaluation. 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 Labs are reviewed and discussed with patient. Proceed with this cycle of Revlimid 10mg  3 weeks on 1 week off.  Ok to stop Acyclovir since he has been 1 year after transplant.   Zometa Q58months -proceed today.  Next due July 2024

## 2022-07-27 NOTE — Assessment & Plan Note (Signed)
Grade 1, stable symptoms.  Observation 

## 2022-07-27 NOTE — Assessment & Plan Note (Addendum)
Patient follows up with oncology for post marrow transplant vaccination. Ok to stop acyclovir 400 mg twice daily

## 2022-07-27 NOTE — Assessment & Plan Note (Signed)
ANC is slightly decreased, overall stable. monitor 

## 2022-07-27 NOTE — Assessment & Plan Note (Signed)
Continue anticoagulation with Eliquis 5mg BID Vascular surgeon recommends no intervention due to lack of significant symptoms.  Patient declined repeat lower extremity ultrasound for follow-up of DVT  

## 2022-07-27 NOTE — Progress Notes (Signed)
Hematology/Oncology Progress note [phone] [phone]        CHIEF COMPLAINTS/REASON FOR VISIT:  Follow-up for multiple myeloma  ASSESSMENT & PLAN:   Cancer Staging  Multiple myeloma not having achieved remission Staging form: Plasma Cell Myeloma and Plasma Cell Disorders, AJCC 8th Edition - Clinical stage from 02/04/2020: Beta-2-microglobulin (mg/L): 2.1, Albumin (g/dL): 2.7, ISS: Stage II, High-risk cytogenetics: Absent, LDH: Unknown - Signed by Rickard Patience, MD on 05/06/2020   Multiple myeloma not having achieved remission #IgG multiple myeloma, stage II, del 1 p, high risk.  Declined bone marrow transplant evaluation. 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 Labs are reviewed and discussed with patient. Proceed with this cycle of Revlimid  3 weeks on 1 week off.  Ok to stop Acyclovir since he has been 1 year after transplant.   Zometa Q11months -proceed today.  Next due July 2024   Encounter for antineoplastic chemotherapy Treatment as planned.  History of bone marrow transplant [name]) Patient follows up with oncology for post marrow transplant vaccination. Ok to stop acyclovir 400 mg twice daily   Chemotherapy-induced neuropathy (HCC) Grade 1, stable symptoms.  Observation  Chronic deep vein thrombosis (DVT) (HCC) Continue anticoagulation with Eliquis  BID Vascular surgeon recommends no intervention due to lack of significant symptoms.  Patient declined repeat lower extremity ultrasound for follow-up of DVT   Drug-induced neutropenia (HCC) ANC is slightly decreased, overall stable. monitor  Hypokalemia Potassium 2.9, recommend patient to increase  potassium to daily.  Repeat BMP in 1 week.     Orders Placed This Encounter  Procedures   CMP (Cancer Center only)    Standing Status:   Future    Standing Expiration Date:   07/27/2023   CBC with Differential (Cancer Center Only)    Standing Status:   Future    Standing Expiration Date:   07/27/2023   Kappa/lambda light chains    Standing Status:   Future    Standing Expiration Date:   07/27/2023   Multiple Myeloma Panel (SPEP&IFE w/QIG)    Standing Status:   Future    Standing Expiration Date:   07/27/2023    Lab MD  4 weeks   All questions were answered. The patient knows to call the clinic with any problems, questions or concerns.  Rickard Patience, MD, PhD [name] Hematology Oncology 07/27/2022     HISTORY OF PRESENTING ILLNESS:   Tommy Smith. is a  67 y.o.  male presents for follow-up of management of multiple myeloma Oncology history summary listed as below  Oncology History  Multiple myeloma not having achieved remission  01/29/2020 Imaging   CT showed multiple bilateral pulmonary nodules measuring up to 10 mm in the right middle lobe, nodules are indeterminate, infectious/inflammatory etiology versus metastatic disease. Multiple osseous lytic lesions, largest lesion involving the left pubic bone concerning for metastatic disease.Compression fractures at T9, age indeterminate. Appearance focal area of thickening at the gastroesophageal junction.  Further evaluation with upper GI study or direct visualization with ndoscopy is recommended No bowel obstruction.Aortic atherosclerosis.   02/04/2020 Cancer Staging   Staging form: Plasma Cell Myeloma and Plasma Cell Disorders, AJCC 8th Edition - Clinical stage from 02/04/2020: Beta-2-microglobulin (mg/L): 2.1, Albumin (g/dL): 2.7, ISS: Stage II,  High-risk cytogenetics: Absent, LDH: Unknown - Signed by Rickard Patience, MD on 05/06/2020   02/21/2020 Initial Diagnosis   Multiple myeloma  Baseline M protein 4.1, kappa light  chain level 333.8 Beta-2 microglobulin 2.1, albumin 2.7.  -02/12/2020, bone marrow biopsy showed hypercellular for age, 80% plasma cell which are complex restricted by light chain in situ heparinization.  Background hematopoiesis is present but reduced. Cytogenetics is normal, Myeloma FISH panel-deletion 1P,Standard risk.   02/27/2020 - 05/06/2021 Chemotherapy   VRd     09/01/2020 Bone Marrow Biopsy   Bone marrow results were reviewed.  1% plasma cell.  Normal cytogenetics.  Myeloma FISH negative for 1p del.   04/21/2021 Bone Marrow Biopsy   Bone marrow after 14 cycles of VRD -[January 2023 ]showed plasma cells 2%.-   06/25/2021 Bone Marrow Transplant   Patient underwent autologous bone marrow transplant at [name]   09/2021 -  Chemotherapy   Revlimid 10 mg 3 weeks on 1 week off   10/26/2021 Imaging     10/26/2021 Imaging   Bilateral lower extremity venous US Partially occlusive right deep vein thrombus extending from the calf veins into the common femoral vein with limited examination of the left extremity demonstrating partially occlusive thrombus in the common femoral vein.     01/13/2022 Imaging   Bilateral lower extremity venous US 1. 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 2. Extensive predominantly occlusive/near occlusive left lower extremity DVT extending from the left common femoral vein through the imaged tibial veins, age-indeterminate in the absence of prior examinations though echogenic thrombus within the left femoral vein suggests a chronic etiology.     01/20/2022 Bone Marrow Biopsy   -Bone marrow biopsy at [name] showed Cellular bone marrow aspirate with trilineage hematopoiesis and no morphologic evidence of plasma  cell neoplasm.  -Negative minimal residual disease.    #Focal area of thickening at the GE junction. 10/23/2020 EGD is normal.   #Lung nodules, measures up to 10 mm on the right middle lobe.  Indeterminate.  Attention on follow-up.  Recommend repeat CT chest and he declined  10/28/2011, unilateral right lower extremity ultrasound showed partially occlusive right deep vein thrombosis extending from the calf vein into the, femoral vein. Patient was recommended to start on Eliquis for anticoagulation.  He tolerates well.  Right thigh pain has improved.  INTERVAL HISTORY Tommy Chap. is a 67 y.o. male who has above history reviewed by me today presents for follow up visit for multiple myeloma Patient is currently on Revlimid 10 mg 3 weeks on 1 week off for maintenance.. Current everyday smoker, 10 cigarettes daily.   Recommend patient to follow up with [facility] bone marrow transplant team Patient denies fever chills.  No new complaints.  Review of Systems  Constitutional:  Negative for appetite change, chills, fatigue, fever and unexpected weight change.  HENT:   Negative for hearing loss and voice change.   Eyes:  Negative for eye problems and icterus.  Respiratory:  Negative for chest tightness, cough and shortness of breath.   Cardiovascular:  Negative for chest pain and leg swelling.  Gastrointestinal:  Negative for abdominal distention, abdominal pain and constipation.  Endocrine: Negative for hot flashes.  Genitourinary:  Negative for difficulty urinating, dysuria and frequency.   Musculoskeletal:  Negative for arthralgias, back pain  and neck pain.  Skin:  Negative for itching and rash.  Neurological:  Positive for numbness. Negative for light-headedness.  Hematological:  Negative for adenopathy. Does not bruise/bleed easily.  Psychiatric/Behavioral:  Negative for confusion.      MEDICAL HISTORY:  Past Medical History:  Diagnosis Date   Cancer    Depression    Hypercalcemia  03/02/2020   Hyperlipemia    Hypertension    Hypogonadism in male    Multiple myeloma     SURGICAL HISTORY: Past Surgical History:  Procedure Laterality Date   BONE MARROW BIOPSY     COLONOSCOPY  07/14/2008   ESOPHAGOGASTRODUODENOSCOPY N/A 10/22/2020   Procedure: ESOPHAGOGASTRODUODENOSCOPY (EGD);  Surgeon: Wyline Mood, MD;  Location: [facility] ENDOSCOPY;  Service: Gastroenterology;  Laterality: N/A;    SOCIAL HISTORY: Social History   Socioeconomic History   Marital status: Married    Spouse name: Cyndia Bent   Number of children: 3   Years of education: Not on file   Highest education level: Not on file  Occupational History   Not on file  Tobacco Use   Smoking status: Every Day    Packs/day: 0.50    Years: 30.00    Additional pack years: 0.00    Total pack years: 15.00    Types: Cigarettes   Smokeless tobacco: Never  Substance and Sexual Activity   Alcohol use: Yes    Comment: occcassional   Drug use: No   Sexual activity: Not on file  Other Topics Concern   Not on file  Social History Narrative   Not on file   Social Determinants of Health   Financial Resource Strain: Not on file  Food Insecurity: Not on file  Transportation Needs: Not on file  Physical Activity: Not on file  Stress: Not on file  Social Connections: Not on file  Intimate Partner Violence: Not on file    FAMILY HISTORY: Family History  Problem Relation Age of Onset   Skin cancer Mother    Prostate cancer Father     ALLERGIES:  has No Known Allergies.  MEDICATIONS:  Current Outpatient Medications  Medication Sig Dispense Refill   acyclovir (ZOVIRAX) 400 MG tablet TAKE 1 TABLET BY MOUTH TWICE A DAY 180 tablet 1   apixaban (ELIQUIS) 5 MG TABS tablet Take 1 tablet (5 mg total) by mouth 2 (two) times daily. 60 tablet 2   buPROPion (WELLBUTRIN XL) 300 MG 24 hr tablet Take 300 mg by mouth daily.     calcium carbonate (OS-CAL) 600 MG tablet Take 2 tablets (1,200 mg total) by mouth daily. 30  tablet 0   cholecalciferol (VITAMIN D3) 25 MCG (1000 UNIT) tablet Take 2,000 Units by mouth daily.     loperamide (IMODIUM) 2 MG capsule Take by mouth.     potassium chloride (KLOR-CON M) 10 MEQ tablet Take 1 tablet (10 mEq total) by mouth daily. 30 tablet 0   sertraline (ZOLOFT) 25 MG tablet Take 1 tablet by mouth daily.     lenalidomide (REVLIMID) 10 MG capsule Take 1 capsule (10 mg total) by mouth daily. Take for 21 days, then hold for 7 days. Repeat every 28 days. 21 capsule 0   No current facility-administered medications for this visit.     PHYSICAL EXAMINATION: ECOG PERFORMANCE STATUS: 1 - Symptomatic but completely ambulatory Vitals:   07/27/22 1306  BP: (!) 127/91  Pulse: 64  Resp: 18  Temp: (!) 96.6 F (35.9 C)  SpO2: 98%   Filed Weights   07/27/22  1306  Weight: 224 lb 3.2 oz (101.7 kg)    Physical Exam Constitutional:      General: He is not in acute distress.    Appearance: He is not diaphoretic.  HENT:     Head: Normocephalic and atraumatic.     Nose: Nose normal.     Mouth/Throat:     Pharynx: No oropharyngeal exudate.  Eyes:     General: No scleral icterus.    Pupils: Pupils are equal, round, and reactive to light.  Cardiovascular:     Rate and Rhythm: Normal rate.  Pulmonary:     Effort: Pulmonary effort is normal. No respiratory distress.     Breath sounds: No wheezing.  Abdominal:     General: There is no distension.     Palpations: Abdomen is soft.     Tenderness: There is no abdominal tenderness.  Musculoskeletal:        General: Normal range of motion.     Cervical back: Normal range of motion.  Skin:    General: Skin is warm and dry.     Findings: No erythema.  Neurological:     Mental Status: He is alert and oriented to person, place, and time. Mental status is at baseline.     Cranial Nerves: No cranial nerve deficit.     Motor: No abnormal muscle tone.     Coordination: Coordination normal.  Psychiatric:        Mood and Affect: Mood  and affect normal.       LABORATORY DATA:  I have reviewed the data as listed    Latest Ref Rng & Units 07/27/2022   12:36 PM 06/29/2022    1:33 PM 06/01/2022    1:34 PM  CBC  WBC 4.0 - 10.5 K/uL 3.1  3.1  3.5   Hemoglobin 13.0 - 17.0 g/dL 16.1  09.6  04.5   Hematocrit 39.0 - 52.0 % 40.3  41.2  45.2   Platelets 150 - 400 K/uL 172  140  152       Latest Ref Rng & Units 07/27/2022   12:36 PM 06/29/2022    1:33 PM 06/01/2022    1:34 PM  CMP  Glucose 70 - 99 mg/dL 83  80  76   BUN 8 - 23 mg/dL 12  16  11    Creatinine 0.61 - 1.24 mg/dL 4.09  8.11  9.14   Sodium 135 - 145 mmol/L 137  138  138   Potassium 3.5 - 5.1 mmol/L 2.9  3.3  3.4   Chloride 98 - 111 mmol/L 103  105  104   CO2 22 - 32 mmol/L 26  26  25    Calcium 8.9 - 10.3 mg/dL 9.0  8.8  9.0   Total Protein 6.5 - 8.1 g/dL 6.4  6.6  7.0   Total Bilirubin 0.3 - 1.2 mg/dL 0.9  1.1  1.0   Alkaline Phos 38 - 126 U/L 66  51  57   AST 15 - 41 U/L 19  19  16    ALT 0 - 44 U/L 18  17  15      RADIOGRAPHIC STUDIES: I have personally reviewed the radiological images as listed and agreed with the findings in the report. No results found.

## 2022-07-27 NOTE — Assessment & Plan Note (Signed)
Potassium 2.9, recommend patient to increase potassium to daily.  Repeat BMP in 1 week.

## 2022-07-27 NOTE — Assessment & Plan Note (Signed)
Treatment as planned 

## 2022-07-27 NOTE — Patient Instructions (Signed)

## 2022-07-28 ENCOUNTER — Telehealth: Payer: Self-pay

## 2022-07-28 DIAGNOSIS — C9000 Multiple myeloma not having achieved remission: Secondary | ICD-10-CM

## 2022-07-28 LAB — KAPPA/LAMBDA LIGHT CHAINS
Kappa free light chain: 17.6 mg/L (ref 3.3–19.4)
Kappa, lambda light chain ratio: 1.31 (ref 0.26–1.65)
Lambda free light chains: 13.4 mg/L (ref 5.7–26.3)

## 2022-07-28 NOTE — Telephone Encounter (Signed)
-----   Message from Rickard Patience, MD sent at 07/27/2022  7:30 PM EDT ----- His K is low at 2.9, please ask if he has been taking KCL daily.  I have sent him a Rx of KCL , please ask him to take daily,  Please arrange him to repeat BMP early next week. Thanks.

## 2022-07-28 NOTE — Telephone Encounter (Signed)
Called pt, no answer. Detailed LVM informing him of lab result and the need for Lab next week.   Please schedule pt for lab early next week. Mychart message sent informing him that if date/time does not work to let us know.

## 2022-07-31 LAB — MULTIPLE MYELOMA PANEL, SERUM
Albumin SerPl Elph-Mcnc: 3.6 g/dL (ref 2.9–4.4)
Albumin/Glob SerPl: 1.5 (ref 0.7–1.7)
Alpha 1: 0.2 g/dL (ref 0.0–0.4)
Alpha2 Glob SerPl Elph-Mcnc: 0.7 g/dL (ref 0.4–1.0)
B-Globulin SerPl Elph-Mcnc: 1.1 g/dL (ref 0.7–1.3)
Gamma Glob SerPl Elph-Mcnc: 0.5 g/dL (ref 0.4–1.8)
Globulin, Total: 2.5 g/dL (ref 2.2–3.9)
IgA: 248 mg/dL (ref 61–437)
IgG (Immunoglobin G), Serum: 606 mg/dL (ref 603–1613)
IgM (Immunoglobulin M), Srm: 13 mg/dL — ABNORMAL LOW (ref 20–172)
Total Protein ELP: 6.1 g/dL (ref 6.0–8.5)

## 2022-08-02 ENCOUNTER — Inpatient Hospital Stay: Payer: Medicare Other

## 2022-08-04 ENCOUNTER — Inpatient Hospital Stay: Payer: Medicare Other

## 2022-08-04 DIAGNOSIS — C9000 Multiple myeloma not having achieved remission: Secondary | ICD-10-CM | POA: Diagnosis not present

## 2022-08-04 LAB — BASIC METABOLIC PANEL - CANCER CENTER ONLY
Anion gap: 5 (ref 5–15)
BUN: 9 mg/dL (ref 8–23)
CO2: 25 mmol/L (ref 22–32)
Calcium: 8.7 mg/dL — ABNORMAL LOW (ref 8.9–10.3)
Chloride: 105 mmol/L (ref 98–111)
Creatinine: 1.11 mg/dL (ref 0.61–1.24)
GFR, Estimated: >60 mL/min (ref >60)
Glucose, Bld: 86 mg/dL (ref 70–99)
Potassium: 4.1 mmol/L (ref 3.5–5.1)
Sodium: 135 mmol/L (ref 135–145)

## 2022-08-12 ENCOUNTER — Other Ambulatory Visit: Payer: Self-pay

## 2022-08-12 DIAGNOSIS — C9000 Multiple myeloma not having achieved remission: Secondary | ICD-10-CM

## 2022-08-12 MED ORDER — LENALIDOMIDE 10 MG PO CAPS
10.0000 mg | ORAL_CAPSULE | Freq: Every day | ORAL | 0 refills | Status: DC
Start: 2022-08-12 — End: 2022-09-16

## 2022-08-25 ENCOUNTER — Encounter: Payer: Self-pay | Admitting: Oncology

## 2022-08-25 ENCOUNTER — Inpatient Hospital Stay: Payer: Medicare Other | Admitting: Oncology

## 2022-08-25 ENCOUNTER — Inpatient Hospital Stay: Payer: Medicare Other | Attending: Oncology

## 2022-08-25 VITALS — BP 119/94 | HR 106 | Temp 96.8°F | Resp 18 | Wt 226.1 lb

## 2022-08-25 DIAGNOSIS — Z7901 Long term (current) use of anticoagulants: Secondary | ICD-10-CM | POA: Insufficient documentation

## 2022-08-25 DIAGNOSIS — Z808 Family history of malignant neoplasm of other organs or systems: Secondary | ICD-10-CM | POA: Insufficient documentation

## 2022-08-25 DIAGNOSIS — Z8042 Family history of malignant neoplasm of prostate: Secondary | ICD-10-CM | POA: Diagnosis not present

## 2022-08-25 DIAGNOSIS — G620 Drug-induced polyneuropathy: Secondary | ICD-10-CM

## 2022-08-25 DIAGNOSIS — C9000 Multiple myeloma not having achieved remission: Secondary | ICD-10-CM

## 2022-08-25 DIAGNOSIS — Z5111 Encounter for antineoplastic chemotherapy: Secondary | ICD-10-CM | POA: Diagnosis not present

## 2022-08-25 DIAGNOSIS — F17210 Nicotine dependence, cigarettes, uncomplicated: Secondary | ICD-10-CM | POA: Insufficient documentation

## 2022-08-25 DIAGNOSIS — Z9481 Bone marrow transplant status: Secondary | ICD-10-CM | POA: Diagnosis not present

## 2022-08-25 DIAGNOSIS — I82513 Chronic embolism and thrombosis of femoral vein, bilateral: Secondary | ICD-10-CM | POA: Diagnosis not present

## 2022-08-25 DIAGNOSIS — D702 Other drug-induced agranulocytosis: Secondary | ICD-10-CM

## 2022-08-25 DIAGNOSIS — T451X5A Adverse effect of antineoplastic and immunosuppressive drugs, initial encounter: Secondary | ICD-10-CM

## 2022-08-25 DIAGNOSIS — I825Y1 Chronic embolism and thrombosis of unspecified deep veins of right proximal lower extremity: Secondary | ICD-10-CM

## 2022-08-25 LAB — CMP (CANCER CENTER ONLY)
ALT: 15 U/L (ref 0–44)
AST: 15 U/L (ref 15–41)
Albumin: 4 g/dL (ref 3.5–5.0)
Alkaline Phosphatase: 54 U/L (ref 38–126)
Anion gap: 8 (ref 5–15)
BUN: 12 mg/dL (ref 8–23)
CO2: 25 mmol/L (ref 22–32)
Calcium: 9.1 mg/dL (ref 8.9–10.3)
Chloride: 106 mmol/L (ref 98–111)
Creatinine: 1.15 mg/dL (ref 0.61–1.24)
GFR, Estimated: >60 mL/min (ref >60)
Glucose, Bld: 66 mg/dL — ABNORMAL LOW (ref 70–99)
Potassium: 3.6 mmol/L (ref 3.5–5.1)
Sodium: 139 mmol/L (ref 135–145)
Total Bilirubin: 1 mg/dL (ref 0.3–1.2)
Total Protein: 6.9 g/dL (ref 6.5–8.1)

## 2022-08-25 LAB — CBC WITH DIFFERENTIAL (CANCER CENTER ONLY)
Abs Immature Granulocytes: 0.01 10*3/uL (ref 0.00–0.07)
Basophils Absolute: 0 10*3/uL (ref 0.0–0.1)
Basophils Relative: 1 %
Eosinophils Absolute: 0.2 10*3/uL (ref 0.0–0.5)
Eosinophils Relative: 6 %
HCT: 43.5 % (ref 39.0–52.0)
Hemoglobin: 14.2 g/dL (ref 13.0–17.0)
Immature Granulocytes: 0 %
Lymphocytes Relative: 45 %
Lymphs Abs: 1.3 10*3/uL (ref 0.7–4.0)
MCH: 31.1 pg (ref 26.0–34.0)
MCHC: 32.6 g/dL (ref 30.0–36.0)
MCV: 95.2 fL (ref 80.0–100.0)
Monocytes Absolute: 0.3 10*3/uL (ref 0.1–1.0)
Monocytes Relative: 11 %
Neutro Abs: 1.1 10*3/uL — ABNORMAL LOW (ref 1.7–7.7)
Neutrophils Relative %: 37 %
Platelet Count: 144 10*3/uL — ABNORMAL LOW (ref 150–400)
RBC: 4.57 MIL/uL (ref 4.22–5.81)
RDW: 17.7 % — ABNORMAL HIGH (ref 11.5–15.5)
WBC Count: 3 10*3/uL — ABNORMAL LOW (ref 4.0–10.5)
nRBC: 0 % (ref 0.0–0.2)

## 2022-08-25 NOTE — Assessment & Plan Note (Signed)
Continue anticoagulation with Eliquis 5mg BID Vascular surgeon recommends no intervention due to lack of significant symptoms.  Patient declined repeat lower extremity ultrasound for follow-up of DVT  

## 2022-08-25 NOTE — Assessment & Plan Note (Signed)
#  IgG multiple myeloma, stage II, del 1 p, high risk.  Declined bone marrow transplant evaluation. 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 Labs are reviewed and discussed with patient. Proceed with this cycle of Revlimid 10mg 3 weeks on 1 week off.  Ok to stop Acyclovir since he has been 1 year after transplant.   Zometa Q3months -proceed today.  Next due July 2024  

## 2022-08-25 NOTE — Assessment & Plan Note (Signed)
ANC is slightly decreased, overall stable. monitor 

## 2022-08-25 NOTE — Progress Notes (Signed)
Hematology/Oncology Progress note [phone] Fax:([phone]) [phone]        CHIEF COMPLAINTS/REASON FOR VISIT:  Follow-up for multiple myeloma  ASSESSMENT & PLAN:   Cancer Staging  Multiple myeloma not having achieved remission (HCC) Staging form: Plasma Cell Myeloma and Plasma Cell Disorders, AJCC 8th Edition - Clinical stage from 02/04/2020: Beta-2-microglobulin (mg/L): 2.1, Albumin (g/dL): 2.7, ISS: Stage II, High-risk cytogenetics: Absent, LDH: Unknown - Signed by Rickard Patience, MD on 05/06/2020   Multiple myeloma not having achieved remission (HCC) #IgG multiple myeloma, stage II, del 1 p, high risk.  Declined bone marrow transplant evaluation. 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 Labs are reviewed and discussed with patient. Proceed with this cycle of Revlimid 10mg  3 weeks on 1 week off.  Ok to stop Acyclovir since he has been 1 year after transplant.   Zometa Q61months -proceed today.  Next due July 2024   Encounter for antineoplastic chemotherapy Treatment as planned.  History of bone marrow transplant [facility]) Patient follows up with oncology for post marrow transplant vaccination. Ok to stop acyclovir 400 mg twice daily   Chemotherapy-induced neuropathy (HCC) Grade 1, stable symptoms.  Observation  Chronic deep vein thrombosis (DVT) (HCC) Continue anticoagulation with Eliquis 5mg  BID Vascular surgeon recommends no intervention due to lack of significant symptoms.  Patient declined repeat lower extremity ultrasound for follow-up of DVT   Drug-induced neutropenia (HCC) ANC is slightly decreased, overall stable. monitor    Orders Placed This Encounter  Procedures   CMP  (Cancer Center only)    Standing Status:   Future    Standing Expiration Date:   08/25/2023   CBC with Differential (Cancer Center Only)    Standing Status:   Future    Standing Expiration Date:   08/25/2023   Kappa/lambda light chains    Standing Status:   Future    Standing Expiration Date:   08/25/2023   Multiple Myeloma Panel (SPEP&IFE w/QIG)    Standing Status:   Future    Standing Expiration Date:   08/25/2023    Lab MD  4 weeks   All questions were answered. The patient knows to call the clinic with any problems, questions or concerns.  Rickard Patience, MD, PhD [facility] Hematology Oncology 08/25/2022     HISTORY OF PRESENTING ILLNESS:   Tommy Smith. is a  67 y.o.  male presents for follow-up of management of multiple myeloma Oncology history summary listed as below  Oncology History  Multiple myeloma not having achieved remission (HCC)  01/29/2020 Imaging   CT showed multiple bilateral pulmonary nodules measuring up to 10 mm in the right middle lobe, nodules are indeterminate, infectious/inflammatory etiology versus metastatic disease. Multiple osseous lytic lesions, largest lesion involving the left pubic bone concerning for metastatic disease.Compression fractures at T9, age indeterminate. Appearance focal area of thickening at the gastroesophageal junction.  Further evaluation with upper GI study or direct visualization with ndoscopy is recommended No bowel obstruction.Aortic atherosclerosis.   02/04/2020 Cancer Staging   Staging form: Plasma Cell Myeloma and Plasma Cell Disorders, AJCC 8th Edition - Clinical stage from 02/04/2020: Beta-2-microglobulin (mg/L): 2.1, Albumin (g/dL): 2.7, ISS: Stage II, High-risk cytogenetics: Absent, LDH: Unknown - Signed by Rickard Patience, MD on 05/06/2020   02/21/2020  Initial Diagnosis   Multiple myeloma  Baseline M protein 4.1, kappa light chain level 333.8 Beta-2 microglobulin 2.1, albumin 2.7.  -02/12/2020, bone marrow biopsy showed  hypercellular for age, 80% plasma cell which are complex restricted by light chain in situ heparinization.  Background hematopoiesis is present but reduced. Cytogenetics is normal, Myeloma FISH panel-deletion 1P,Standard risk.   02/27/2020 - 05/06/2021 Chemotherapy   VRd     09/01/2020 Bone Marrow Biopsy   Bone marrow results were reviewed.  1% plasma cell.  Normal cytogenetics.  Myeloma FISH negative for 1p del.   04/21/2021 Bone Marrow Biopsy   Bone marrow after 14 cycles of VRD -[January 2023 ]showed plasma cells 2%.-   06/25/2021 Bone Marrow Transplant   Patient underwent autologous bone marrow transplant at [facility]   09/2021 -  Chemotherapy   Revlimid 10 mg 3 weeks on 1 week off   10/26/2021 Imaging     10/26/2021 Imaging   Bilateral lower extremity venous US Partially occlusive right deep vein thrombus extending from the calf veins into the common femoral vein with limited examination of the left extremity demonstrating partially occlusive thrombus in the common femoral vein.     01/13/2022 Imaging   Bilateral lower extremity venous US 1. 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 2. Extensive predominantly occlusive/near occlusive left lower extremity DVT extending from the left common femoral vein through the imaged tibial veins, age-indeterminate in the absence of prior examinations though echogenic thrombus within the left femoral vein suggests a chronic etiology.     01/20/2022 Bone Marrow Biopsy   -Bone marrow biopsy at [facility] showed Cellular bone marrow aspirate with trilineage hematopoiesis and no morphologic evidence of plasma cell neoplasm.  -Negative minimal residual disease.    #Focal area of thickening at the GE  junction. 10/23/2020 EGD is normal.   #Lung nodules, measures up to 10 mm on the right middle lobe.  Indeterminate.  Attention on follow-up.  Recommend repeat CT chest and he declined  10/28/2011, unilateral right lower extremity ultrasound showed partially occlusive right deep vein thrombosis extending from the calf vein into the, femoral vein. Patient was recommended to start on Eliquis for anticoagulation.  He tolerates well.  Right thigh pain has improved.  INTERVAL HISTORY Khyler Brunswick. is a 67 y.o. male who has above history reviewed by me today presents for follow up visit for multiple myeloma Patient is currently on Revlimid 10 mg 3 weeks on 1 week off for maintenance.Tolerates well.  Current everyday smoker, 10 cigarettes daily.   Patient denies fever chills.  No new complaints.  Review of Systems  Constitutional:  Negative for appetite change, chills, fatigue, fever and unexpected weight change.  HENT:   Negative for hearing loss and voice change.   Eyes:  Negative for eye problems and icterus.  Respiratory:  Negative for chest tightness, cough and shortness of breath.   Cardiovascular:  Negative for chest pain and leg swelling.  Gastrointestinal:  Negative for abdominal distention, abdominal pain and constipation.  Endocrine: Negative for hot flashes.  Genitourinary:  Negative for difficulty urinating, dysuria and frequency.   Musculoskeletal:  Negative for arthralgias, back pain and neck pain.  Skin:  Negative for itching and rash.  Neurological:  Positive for numbness. Negative for light-headedness.  Hematological:  Negative for  adenopathy. Does not bruise/bleed easily.  Psychiatric/Behavioral:  Negative for confusion.      MEDICAL HISTORY:  Past Medical History:  Diagnosis Date   Cancer (HCC)    Depression    Hypercalcemia 03/02/2020   Hyperlipemia    Hypertension    Hypogonadism in male    Multiple myeloma [facility])     SURGICAL HISTORY: Past Surgical History:   Procedure Laterality Date   BONE MARROW BIOPSY     COLONOSCOPY  07/14/2008   ESOPHAGOGASTRODUODENOSCOPY N/A 10/22/2020   Procedure: ESOPHAGOGASTRODUODENOSCOPY (EGD);  Surgeon: Wyline Mood, MD;  Location: [facility] ENDOSCOPY;  Service: Gastroenterology;  Laterality: N/A;    SOCIAL HISTORY: Social History   Socioeconomic History   Marital status: Married    Spouse name: Cyndia Bent   Number of children: 3   Years of education: Not on file   Highest education level: Not on file  Occupational History   Not on file  Tobacco Use   Smoking status: Every Day    Packs/day: 0.50    Years: 30.00    Additional pack years: 0.00    Total pack years: 15.00    Types: Cigarettes   Smokeless tobacco: Never  Substance and Sexual Activity   Alcohol use: Yes    Comment: occcassional   Drug use: No   Sexual activity: Not on file  Other Topics Concern   Not on file  Social History Narrative   Not on file   Social Determinants of Health   Financial Resource Strain: Not on file  Food Insecurity: Not on file  Transportation Needs: Not on file  Physical Activity: Not on file  Stress: Not on file  Social Connections: Not on file  Intimate Partner Violence: Not on file    FAMILY HISTORY: Family History  Problem Relation Age of Onset   Skin cancer Mother    Prostate cancer Father     ALLERGIES:  has No Known Allergies.  MEDICATIONS:  Current Outpatient Medications  Medication Sig Dispense Refill   apixaban (ELIQUIS) 5 MG TABS tablet Take 1 tablet (5 mg total) by mouth 2 (two) times daily. 60 tablet 2   buPROPion (WELLBUTRIN XL) 300 MG 24 hr tablet Take 300 mg by mouth daily.     calcium carbonate (OS-CAL) 600 MG tablet Take 2 tablets (1,200 mg total) by mouth daily. 30 tablet 0   cholecalciferol (VITAMIN D3) 25 MCG (1000 UNIT) tablet Take 2,000 Units by mouth daily.     lenalidomide (REVLIMID) 10 MG capsule Take 1 capsule (10 mg total) by mouth daily. Take for 21 days, then hold for 7 days.  Repeat every 28 days. 21 capsule 0   loperamide (IMODIUM) 2 MG capsule Take by mouth.     potassium chloride SA (KLOR-CON M) 20 MEQ tablet Take 1 tablet (20 mEq total) by mouth daily. 30 tablet 1   sertraline (ZOLOFT) 25 MG tablet Take 1 tablet by mouth daily.     sertraline (ZOLOFT) 50 MG tablet Take 1 tablet by mouth daily.     No current facility-administered medications for this visit.     PHYSICAL EXAMINATION: ECOG PERFORMANCE STATUS: 1 - Symptomatic but completely ambulatory Vitals:   08/25/22 1347  BP: (!) 119/94  Pulse: (!) 106  Resp: 18  Temp: (!) 96.8 F (36 C)  SpO2: 98%   Filed Weights   08/25/22 1347  Weight: 226 lb 1.6 oz (102.6 kg)    Physical Exam Constitutional:      General: He is not  in acute distress.    Appearance: He is not diaphoretic.  HENT:     Head: Normocephalic and atraumatic.     Nose: Nose normal.     Mouth/Throat:     Pharynx: No oropharyngeal exudate.  Eyes:     General: No scleral icterus.    Pupils: Pupils are equal, round, and reactive to light.  Cardiovascular:     Rate and Rhythm: Normal rate.  Pulmonary:     Effort: Pulmonary effort is normal. No respiratory distress.     Breath sounds: No wheezing.  Abdominal:     General: There is no distension.     Palpations: Abdomen is soft.     Tenderness: There is no abdominal tenderness.  Musculoskeletal:        General: Normal range of motion.     Cervical back: Normal range of motion.  Skin:    General: Skin is warm and dry.     Findings: No erythema.  Neurological:     Mental Status: He is alert and oriented to person, place, and time. Mental status is at baseline.     Cranial Nerves: No cranial nerve deficit.     Motor: No abnormal muscle tone.     Coordination: Coordination normal.  Psychiatric:        Mood and Affect: Mood and affect normal.       LABORATORY DATA:  I have reviewed the data as listed    Latest Ref Rng & Units 08/25/2022    1:34 PM 07/27/2022   12:36  PM 06/29/2022    1:33 PM  CBC  WBC 4.0 - 10.5 K/uL 3.0  3.1  3.1   Hemoglobin 13.0 - 17.0 g/dL 45.4  09.8  11.9   Hematocrit 39.0 - 52.0 % 43.5  40.3  41.2   Platelets 150 - 400 K/uL 144  172  140       Latest Ref Rng & Units 08/25/2022    1:34 PM 08/04/2022    8:01 AM 07/27/2022   12:36 PM  CMP  Glucose 70 - 99 mg/dL 66  86  83   BUN 8 - 23 mg/dL 12  9  12    Creatinine 0.61 - 1.24 mg/dL 1.47  8.29  5.62   Sodium 135 - 145 mmol/L 139  135  137   Potassium 3.5 - 5.1 mmol/L 3.6  4.1  2.9   Chloride 98 - 111 mmol/L 106  105  103   CO2 22 - 32 mmol/L 25  25  26    Calcium 8.9 - 10.3 mg/dL 9.1  8.7  9.0   Total Protein 6.5 - 8.1 g/dL 6.9   6.4   Total Bilirubin 0.3 - 1.2 mg/dL 1.0   0.9   Alkaline Phos 38 - 126 U/L 54   66   AST 15 - 41 U/L 15   19   ALT 0 - 44 U/L 15   18     RADIOGRAPHIC STUDIES: I have personally reviewed the radiological images as listed and agreed with the findings in the report. No results found.

## 2022-08-25 NOTE — Assessment & Plan Note (Signed)
Patient follows up with oncology for post marrow transplant vaccination. Ok to stop acyclovir 400 mg twice daily  

## 2022-08-25 NOTE — Assessment & Plan Note (Signed)
Grade 1, stable symptoms.  Observation 

## 2022-08-25 NOTE — Assessment & Plan Note (Signed)
Treatment as planned 

## 2022-08-26 LAB — KAPPA/LAMBDA LIGHT CHAINS
Kappa free light chain: 15.6 mg/L (ref 3.3–19.4)
Kappa, lambda light chain ratio: 1.34 (ref 0.26–1.65)
Lambda free light chains: 11.6 mg/L (ref 5.7–26.3)

## 2022-09-02 LAB — MULTIPLE MYELOMA PANEL, SERUM
Albumin SerPl Elph-Mcnc: 3.8 g/dL (ref 2.9–4.4)
Albumin/Glob SerPl: 1.6 (ref 0.7–1.7)
Alpha 1: 0.2 g/dL (ref 0.0–0.4)
Alpha2 Glob SerPl Elph-Mcnc: 0.7 g/dL (ref 0.4–1.0)
B-Globulin SerPl Elph-Mcnc: 1.1 g/dL (ref 0.7–1.3)
Gamma Glob SerPl Elph-Mcnc: 0.5 g/dL (ref 0.4–1.8)
Globulin, Total: 2.5 g/dL (ref 2.2–3.9)
IgA: 279 mg/dL (ref 61–437)
IgG (Immunoglobin G), Serum: 685 mg/dL (ref 603–1613)
IgM (Immunoglobulin M), Srm: 8 mg/dL — ABNORMAL LOW (ref 20–172)
Total Protein ELP: 6.3 g/dL (ref 6.0–8.5)

## 2022-09-08 ENCOUNTER — Ambulatory Visit (INDEPENDENT_AMBULATORY_CARE_PROVIDER_SITE_OTHER): Payer: Medicare Other | Admitting: Vascular Surgery

## 2022-09-16 ENCOUNTER — Other Ambulatory Visit: Payer: Self-pay

## 2022-09-16 DIAGNOSIS — C9000 Multiple myeloma not having achieved remission: Secondary | ICD-10-CM

## 2022-09-16 MED ORDER — LENALIDOMIDE 10 MG PO CAPS
10.0000 mg | ORAL_CAPSULE | Freq: Every day | ORAL | 0 refills | Status: DC
Start: 2022-09-16 — End: 2022-10-26

## 2022-09-26 ENCOUNTER — Inpatient Hospital Stay: Payer: Medicare Other | Attending: Oncology

## 2022-09-26 ENCOUNTER — Inpatient Hospital Stay (HOSPITAL_BASED_OUTPATIENT_CLINIC_OR_DEPARTMENT_OTHER): Payer: Medicare Other | Admitting: Oncology

## 2022-09-26 ENCOUNTER — Encounter: Payer: Self-pay | Admitting: Oncology

## 2022-09-26 VITALS — BP 134/97 | HR 62 | Temp 97.0°F | Resp 18 | Wt 230.6 lb

## 2022-09-26 DIAGNOSIS — I825Y1 Chronic embolism and thrombosis of unspecified deep veins of right proximal lower extremity: Secondary | ICD-10-CM

## 2022-09-26 DIAGNOSIS — I700 Atherosclerosis of aorta: Secondary | ICD-10-CM | POA: Diagnosis not present

## 2022-09-26 DIAGNOSIS — Z79899 Other long term (current) drug therapy: Secondary | ICD-10-CM | POA: Diagnosis not present

## 2022-09-26 DIAGNOSIS — Z5111 Encounter for antineoplastic chemotherapy: Secondary | ICD-10-CM

## 2022-09-26 DIAGNOSIS — G620 Drug-induced polyneuropathy: Secondary | ICD-10-CM

## 2022-09-26 DIAGNOSIS — I82513 Chronic embolism and thrombosis of femoral vein, bilateral: Secondary | ICD-10-CM | POA: Insufficient documentation

## 2022-09-26 DIAGNOSIS — Z9221 Personal history of antineoplastic chemotherapy: Secondary | ICD-10-CM | POA: Diagnosis not present

## 2022-09-26 DIAGNOSIS — T451X5A Adverse effect of antineoplastic and immunosuppressive drugs, initial encounter: Secondary | ICD-10-CM

## 2022-09-26 DIAGNOSIS — Z9481 Bone marrow transplant status: Secondary | ICD-10-CM | POA: Insufficient documentation

## 2022-09-26 DIAGNOSIS — I10 Essential (primary) hypertension: Secondary | ICD-10-CM | POA: Insufficient documentation

## 2022-09-26 DIAGNOSIS — D702 Other drug-induced agranulocytosis: Secondary | ICD-10-CM | POA: Diagnosis not present

## 2022-09-26 DIAGNOSIS — R918 Other nonspecific abnormal finding of lung field: Secondary | ICD-10-CM | POA: Diagnosis not present

## 2022-09-26 DIAGNOSIS — C9000 Multiple myeloma not having achieved remission: Secondary | ICD-10-CM

## 2022-09-26 DIAGNOSIS — F17210 Nicotine dependence, cigarettes, uncomplicated: Secondary | ICD-10-CM | POA: Diagnosis not present

## 2022-09-26 DIAGNOSIS — Z7961 Long term (current) use of immunomodulator: Secondary | ICD-10-CM | POA: Diagnosis not present

## 2022-09-26 DIAGNOSIS — D701 Agranulocytosis secondary to cancer chemotherapy: Secondary | ICD-10-CM | POA: Insufficient documentation

## 2022-09-26 DIAGNOSIS — E785 Hyperlipidemia, unspecified: Secondary | ICD-10-CM | POA: Insufficient documentation

## 2022-09-26 DIAGNOSIS — Z7901 Long term (current) use of anticoagulants: Secondary | ICD-10-CM | POA: Diagnosis not present

## 2022-09-26 LAB — CBC WITH DIFFERENTIAL (CANCER CENTER ONLY)
Abs Immature Granulocytes: 0 10*3/uL (ref 0.00–0.07)
Basophils Absolute: 0 10*3/uL (ref 0.0–0.1)
Basophils Relative: 1 %
Eosinophils Absolute: 0.2 10*3/uL (ref 0.0–0.5)
Eosinophils Relative: 6 %
HCT: 42.6 % (ref 39.0–52.0)
Hemoglobin: 13.8 g/dL (ref 13.0–17.0)
Immature Granulocytes: 0 %
Lymphocytes Relative: 48 %
Lymphs Abs: 1.4 10*3/uL (ref 0.7–4.0)
MCH: 30.9 pg (ref 26.0–34.0)
MCHC: 32.4 g/dL (ref 30.0–36.0)
MCV: 95.5 fL (ref 80.0–100.0)
Monocytes Absolute: 0.3 10*3/uL (ref 0.1–1.0)
Monocytes Relative: 9 %
Neutro Abs: 1 10*3/uL — ABNORMAL LOW (ref 1.7–7.7)
Neutrophils Relative %: 36 %
Platelet Count: 144 10*3/uL — ABNORMAL LOW (ref 150–400)
RBC: 4.46 MIL/uL (ref 4.22–5.81)
RDW: 17.4 % — ABNORMAL HIGH (ref 11.5–15.5)
WBC Count: 2.9 10*3/uL — ABNORMAL LOW (ref 4.0–10.5)
nRBC: 0 % (ref 0.0–0.2)

## 2022-09-26 LAB — CMP (CANCER CENTER ONLY)
ALT: 19 U/L (ref 0–44)
AST: 16 U/L (ref 15–41)
Albumin: 4.1 g/dL (ref 3.5–5.0)
Alkaline Phosphatase: 50 U/L (ref 38–126)
Anion gap: 8 (ref 5–15)
BUN: 13 mg/dL (ref 8–23)
CO2: 25 mmol/L (ref 22–32)
Calcium: 9.7 mg/dL (ref 8.9–10.3)
Chloride: 104 mmol/L (ref 98–111)
Creatinine: 1.07 mg/dL (ref 0.61–1.24)
GFR, Estimated: >60 mL/min (ref >60)
Glucose, Bld: 87 mg/dL (ref 70–99)
Potassium: 3.7 mmol/L (ref 3.5–5.1)
Sodium: 137 mmol/L (ref 135–145)
Total Bilirubin: 1 mg/dL (ref 0.3–1.2)
Total Protein: 6.9 g/dL (ref 6.5–8.1)

## 2022-09-26 NOTE — Assessment & Plan Note (Signed)
Treatment as planned 

## 2022-09-26 NOTE — Progress Notes (Signed)
Hematology/Oncology Progress note [phone] [phone]        CHIEF COMPLAINTS/REASON FOR VISIT:  Follow-up for multiple myeloma  ASSESSMENT & PLAN:   Cancer Staging  Multiple myeloma not having achieved remission (HCC) Staging form: Plasma Cell Myeloma and Plasma Cell Disorders, AJCC 8th Edition - Clinical stage from 02/04/2020: Beta-2-microglobulin (mg/L): 2.1, Albumin (g/dL): 2.7, ISS: Stage II, High-risk cytogenetics: Absent, LDH: Unknown - Signed by Rickard Patience, MD on 05/06/2020   Multiple myeloma not having achieved remission (HCC) #IgG multiple myeloma, stage II, del 1 p, high risk.  Declined bone marrow transplant evaluation. 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 Labs are reviewed and discussed with patient. Hold off starting today to allow better marrow recovery ! Week he will proceed with Revlimid 10mg  3 weeks on 1 week off.  Ok to stop Acyclovir since he has been 1 year after transplant.   Zometa Q52months -proceed today.  Next due July 2024   Drug-induced neutropenia (HCC) ANC is slightly decreased, overall stable. monitor  Encounter for antineoplastic chemotherapy Treatment as planned.  History of bone marrow transplant [facility]) Patient follows up with oncology for post marrow transplant vaccination. Ok to stop acyclovir 400 mg twice daily   Chemotherapy-induced neuropathy (HCC) Grade 1, stable symptoms.  Observation  Chronic deep vein thrombosis (DVT) (HCC) Continue anticoagulation with Eliquis 5mg  BID Patient declined repeat lower extremity ultrasound for follow-up of DVT     Orders Placed This Encounter  Procedures   CMP (Cancer Center only)     Standing Status:   Future    Standing Expiration Date:   09/26/2023   CBC with Differential (Cancer Center Only)    Standing Status:   Future    Standing Expiration Date:   09/26/2023   Multiple Myeloma Panel (SPEP&IFE w/QIG)    Standing Status:   Future    Standing Expiration Date:   09/26/2023   Kappa/lambda light chains    Standing Status:   Future    Standing Expiration Date:   09/26/2023    Lab MD 5 weeks   All questions were answered. The patient knows to call the clinic with any problems, questions or concerns.  Rickard Patience, MD, PhD [facility] Hematology Oncology 09/26/2022     HISTORY OF PRESENTING ILLNESS:   Tommy Swindall. is a  67 y.o.  male presents for follow-up of management of multiple myeloma Oncology history summary listed as below  Oncology History  Multiple myeloma not having achieved remission (HCC)  01/29/2020 Imaging   CT showed multiple bilateral pulmonary nodules measuring up to 10 mm in the right middle lobe, nodules are indeterminate, infectious/inflammatory etiology versus metastatic disease. Multiple osseous lytic lesions, largest lesion involving the left pubic bone concerning for metastatic disease.Compression fractures at T9, age indeterminate. Appearance focal area of thickening at the gastroesophageal junction.  Further evaluation with upper GI study or direct visualization with ndoscopy is recommended No bowel obstruction.Aortic atherosclerosis.   02/04/2020 Cancer Staging   Staging form: Plasma Cell Myeloma and Plasma Cell Disorders, AJCC 8th Edition - Clinical stage from 02/04/2020: Beta-2-microglobulin (mg/L): 2.1, Albumin (g/dL): 2.7, ISS: Stage II, High-risk cytogenetics: Absent, LDH: Unknown - Signed by Rickard Patience, MD on 05/06/2020   02/21/2020 Initial Diagnosis  Multiple myeloma  Baseline M protein 4.1, kappa light chain level 333.8 Beta-2 microglobulin 2.1, albumin 2.7.  -02/12/2020, bone marrow biopsy showed hypercellular for age, 80%  plasma cell which are complex restricted by light chain in situ heparinization.  Background hematopoiesis is present but reduced. Cytogenetics is normal, Myeloma FISH panel-deletion 1P,Standard risk.   02/27/2020 - 05/06/2021 Chemotherapy   VRd     09/01/2020 Bone Marrow Biopsy   Bone marrow results were reviewed.  1% plasma cell.  Normal cytogenetics.  Myeloma FISH negative for 1p del.   04/21/2021 Bone Marrow Biopsy   Bone marrow after 14 cycles of VRD -[January 2023 ]showed plasma cells 2%.-   06/25/2021 Bone Marrow Transplant   Patient underwent autologous bone marrow transplant at [facility]   09/2021 -  Chemotherapy   Revlimid 10 mg 3 weeks on 1 week off   10/26/2021 Imaging     10/26/2021 Imaging   Bilateral lower extremity venous US Partially occlusive right deep vein thrombus extending from the calf veins into the common femoral vein with limited examination of the left extremity demonstrating partially occlusive thrombus in the common femoral vein.     01/13/2022 Imaging   Bilateral lower extremity venous US 1. 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 2. Extensive predominantly occlusive/near occlusive left lower extremity DVT extending from the left common femoral vein through the imaged tibial veins, age-indeterminate in the absence of prior examinations though echogenic thrombus within the left femoral vein suggests a chronic etiology.     01/20/2022 Bone Marrow Biopsy   -Bone marrow biopsy at [facility] showed Cellular bone marrow aspirate with trilineage hematopoiesis and no morphologic evidence of plasma cell neoplasm.  -Negative minimal residual disease.    #Focal area of thickening at the GE junction. 10/23/2020 EGD is  normal.   #Lung nodules, measures up to 10 mm on the right middle lobe.  Indeterminate.  Attention on follow-up.  Recommend repeat CT chest and he declined  10/28/2011, unilateral right lower extremity ultrasound showed partially occlusive right deep vein thrombosis extending from the calf vein into the, femoral vein. Patient was recommended to start on Eliquis for anticoagulation.  He tolerates well.  Right thigh pain has improved.  INTERVAL HISTORY Tommy Portugal. is a 67 y.o. male who has above history reviewed by me today presents for follow up visit for multiple myeloma Patient is currently on Revlimid 10 mg 3 weeks on 1 week off for maintenance.Tolerates well.  Current everyday smoker, 10 cigarettes daily.   Patient denies fever chills.  No new complaints.  Review of Systems  Constitutional:  Negative for appetite change, chills, fatigue, fever and unexpected weight change.  HENT:   Negative for hearing loss and voice change.   Eyes:  Negative for eye problems and icterus.  Respiratory:  Negative for chest tightness, cough and shortness of breath.   Cardiovascular:  Negative for chest pain and leg swelling.  Gastrointestinal:  Negative for abdominal distention, abdominal pain and constipation.  Endocrine: Negative for hot flashes.  Genitourinary:  Negative for difficulty urinating, dysuria and frequency.   Musculoskeletal:  Negative for arthralgias, back pain and neck pain.  Skin:  Negative for itching and rash.  Neurological:  Positive for numbness. Negative for light-headedness.  Hematological:  Negative for adenopathy. Does not bruise/bleed  easily.  Psychiatric/Behavioral:  Negative for confusion.      MEDICAL HISTORY:  Past Medical History:  Diagnosis Date   Cancer (HCC)    Depression    Hypercalcemia 03/02/2020   Hyperlipemia    Hypertension    Hypogonadism in male    Multiple myeloma [facility])     SURGICAL HISTORY: Past Surgical History:  Procedure Laterality  Date   BONE MARROW BIOPSY     COLONOSCOPY  07/14/2008   ESOPHAGOGASTRODUODENOSCOPY N/A 10/22/2020   Procedure: ESOPHAGOGASTRODUODENOSCOPY (EGD);  Surgeon: Wyline Mood, MD;  Location: [facility] ENDOSCOPY;  Service: Gastroenterology;  Laterality: N/A;    SOCIAL HISTORY: Social History   Socioeconomic History   Marital status: Married    Spouse name: Cyndia Bent   Number of children: 3   Years of education: Not on file   Highest education level: Not on file  Occupational History   Not on file  Tobacco Use   Smoking status: Every Day    Packs/day: 0.50    Years: 30.00    Additional pack years: 0.00    Total pack years: 15.00    Types: Cigarettes   Smokeless tobacco: Never  Substance and Sexual Activity   Alcohol use: Yes    Comment: occcassional   Drug use: No   Sexual activity: Not on file  Other Topics Concern   Not on file  Social History Narrative   Not on file   Social Determinants of Health   Financial Resource Strain: Not on file  Food Insecurity: Not on file  Transportation Needs: Not on file  Physical Activity: Not on file  Stress: Not on file  Social Connections: Not on file  Intimate Partner Violence: Not on file    FAMILY HISTORY: Family History  Problem Relation Age of Onset   Skin cancer Mother    Prostate cancer Father     ALLERGIES:  has No Known Allergies.  MEDICATIONS:  Current Outpatient Medications  Medication Sig Dispense Refill   apixaban (ELIQUIS) 5 MG TABS tablet Take 1 tablet (5 mg total) by mouth 2 (two) times daily. 60 tablet 2   buPROPion (WELLBUTRIN XL) 300 MG 24 hr tablet Take 300 mg by mouth daily.     calcium carbonate (OS-CAL) 600 MG tablet Take 2 tablets (1,200 mg total) by mouth daily. 30 tablet 0   cholecalciferol (VITAMIN D3) 25 MCG (1000 UNIT) tablet Take 2,000 Units by mouth daily.     lenalidomide (REVLIMID) 10 MG capsule Take 1 capsule (10 mg total) by mouth daily. Take for 21 days, then hold for 7 days. Repeat every 28 days.  21 capsule 0   loperamide (IMODIUM) 2 MG capsule Take by mouth.     potassium chloride SA (KLOR-CON M) 20 MEQ tablet Take 1 tablet (20 mEq total) by mouth daily. 30 tablet 1   sertraline (ZOLOFT) 50 MG tablet Take 1 tablet by mouth daily.     No current facility-administered medications for this visit.     PHYSICAL EXAMINATION: ECOG PERFORMANCE STATUS: 1 - Symptomatic but completely ambulatory Vitals:   09/26/22 1304  BP: (!) 134/97  Pulse: 62  Resp: 18  Temp: (!) 97 F (36.1 C)  SpO2: 99%   Filed Weights   09/26/22 1304  Weight: 230 lb 9.6 oz (104.6 kg)    Physical Exam Constitutional:      General: He is not in acute distress.    Appearance: He is not diaphoretic.  HENT:     Head: Normocephalic and  atraumatic.     Nose: Nose normal.     Mouth/Throat:     Pharynx: No oropharyngeal exudate.  Eyes:     General: No scleral icterus.    Pupils: Pupils are equal, round, and reactive to light.  Cardiovascular:     Rate and Rhythm: Normal rate.  Pulmonary:     Effort: Pulmonary effort is normal. No respiratory distress.     Breath sounds: No wheezing.  Abdominal:     General: There is no distension.     Palpations: Abdomen is soft.     Tenderness: There is no abdominal tenderness.  Musculoskeletal:        General: Normal range of motion.     Cervical back: Normal range of motion.  Skin:    General: Skin is warm and dry.     Findings: No erythema.  Neurological:     Mental Status: He is alert and oriented to person, place, and time. Mental status is at baseline.     Cranial Nerves: No cranial nerve deficit.     Motor: No abnormal muscle tone.     Coordination: Coordination normal.  Psychiatric:        Mood and Affect: Mood and affect normal.       LABORATORY DATA:  I have reviewed the data as listed    Latest Ref Rng & Units 09/26/2022   12:45 PM 08/25/2022    1:34 PM 07/27/2022   12:36 PM  CBC  WBC 4.0 - 10.5 K/uL 2.9  3.0  3.1   Hemoglobin 13.0 - 17.0  g/dL 14.7  82.9  56.2   Hematocrit 39.0 - 52.0 % 42.6  43.5  40.3   Platelets 150 - 400 K/uL 144  144  172       Latest Ref Rng & Units 09/26/2022   12:45 PM 08/25/2022    1:34 PM 08/04/2022    8:01 AM  CMP  Glucose 70 - 99 mg/dL 87  66  86   BUN 8 - 23 mg/dL 13  12  9    Creatinine 0.61 - 1.24 mg/dL 1.30  8.65  7.84   Sodium 135 - 145 mmol/L 137  139  135   Potassium 3.5 - 5.1 mmol/L 3.7  3.6  4.1   Chloride 98 - 111 mmol/L 104  106  105   CO2 22 - 32 mmol/L 25  25  25    Calcium 8.9 - 10.3 mg/dL 9.7  9.1  8.7   Total Protein 6.5 - 8.1 g/dL 6.9  6.9    Total Bilirubin 0.3 - 1.2 mg/dL 1.0  1.0    Alkaline Phos 38 - 126 U/L 50  54    AST 15 - 41 U/L 16  15    ALT 0 - 44 U/L 19  15      RADIOGRAPHIC STUDIES: I have personally reviewed the radiological images as listed and agreed with the findings in the report. No results found.

## 2022-09-26 NOTE — Assessment & Plan Note (Signed)
Patient follows up with oncology for post marrow transplant vaccination. Ok to stop acyclovir 400 mg twice daily  

## 2022-09-26 NOTE — Assessment & Plan Note (Signed)
Grade 1, stable symptoms.  Observation 

## 2022-09-26 NOTE — Assessment & Plan Note (Addendum)
#  IgG multiple myeloma, stage II, del 1 p, high risk.  Declined bone marrow transplant evaluation. 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 Labs are reviewed and discussed with patient. Hold off starting today to allow better marrow recovery ! Week he will proceed with Revlimid 10mg  3 weeks on 1 week off.  Ok to stop Acyclovir since he has been 1 year after transplant.   Zometa Q76months -proceed today.  Next due July 2024

## 2022-09-26 NOTE — Assessment & Plan Note (Signed)
ANC is slightly decreased, overall stable. monitor 

## 2022-09-26 NOTE — Assessment & Plan Note (Signed)
Continue anticoagulation with Eliquis 5mg  BID Patient declined repeat lower extremity ultrasound for follow-up of DVT

## 2022-09-27 ENCOUNTER — Telehealth: Payer: Self-pay

## 2022-09-27 ENCOUNTER — Other Ambulatory Visit: Payer: Self-pay

## 2022-09-27 LAB — KAPPA/LAMBDA LIGHT CHAINS
Kappa free light chain: 14.5 mg/L (ref 3.3–19.4)
Kappa, lambda light chain ratio: 1.03 (ref 0.26–1.65)
Lambda free light chains: 14.1 mg/L (ref 5.7–26.3)

## 2022-09-27 NOTE — Telephone Encounter (Signed)
CSW received a message from Valinda Hoar, RN, that patient would like a call to discuss his bills.  Left vm.

## 2022-09-30 LAB — MULTIPLE MYELOMA PANEL, SERUM
Albumin SerPl Elph-Mcnc: 3.6 g/dL (ref 2.9–4.4)
Albumin/Glob SerPl: 1.7 (ref 0.7–1.7)
Alpha 1: 0.2 g/dL (ref 0.0–0.4)
Alpha2 Glob SerPl Elph-Mcnc: 0.6 g/dL (ref 0.4–1.0)
B-Globulin SerPl Elph-Mcnc: 0.9 g/dL (ref 0.7–1.3)
Gamma Glob SerPl Elph-Mcnc: 0.5 g/dL (ref 0.4–1.8)
Globulin, Total: 2.2 g/dL (ref 2.2–3.9)
IgA: 274 mg/dL (ref 61–437)
IgG (Immunoglobin G), Serum: 688 mg/dL (ref 603–1613)
IgM (Immunoglobulin M), Srm: 13 mg/dL — ABNORMAL LOW (ref 20–172)
Total Protein ELP: 5.8 g/dL — ABNORMAL LOW (ref 6.0–8.5)

## 2022-10-26 ENCOUNTER — Other Ambulatory Visit: Payer: Self-pay

## 2022-10-26 DIAGNOSIS — C9000 Multiple myeloma not having achieved remission: Secondary | ICD-10-CM

## 2022-10-26 MED ORDER — LENALIDOMIDE 10 MG PO CAPS
10.0000 mg | ORAL_CAPSULE | Freq: Every day | ORAL | 0 refills | Status: DC
Start: 2022-10-26 — End: 2022-11-10

## 2022-10-31 ENCOUNTER — Inpatient Hospital Stay: Payer: Medicare Other

## 2022-10-31 ENCOUNTER — Inpatient Hospital Stay: Payer: Medicare Other | Admitting: Oncology

## 2022-10-31 ENCOUNTER — Inpatient Hospital Stay: Payer: Medicare Other | Attending: Oncology

## 2022-11-10 ENCOUNTER — Encounter: Payer: Self-pay | Admitting: Oncology

## 2022-11-10 ENCOUNTER — Inpatient Hospital Stay: Payer: Medicare Other | Attending: Oncology

## 2022-11-10 ENCOUNTER — Inpatient Hospital Stay (HOSPITAL_BASED_OUTPATIENT_CLINIC_OR_DEPARTMENT_OTHER): Payer: Medicare Other | Admitting: Oncology

## 2022-11-10 ENCOUNTER — Other Ambulatory Visit: Payer: Self-pay | Admitting: Pharmacist

## 2022-11-10 ENCOUNTER — Inpatient Hospital Stay: Payer: Medicare Other

## 2022-11-10 VITALS — BP 138/89 | HR 68 | Temp 96.7°F | Resp 18 | Wt 236.5 lb

## 2022-11-10 DIAGNOSIS — C9000 Multiple myeloma not having achieved remission: Secondary | ICD-10-CM | POA: Diagnosis present

## 2022-11-10 DIAGNOSIS — G620 Drug-induced polyneuropathy: Secondary | ICD-10-CM

## 2022-11-10 DIAGNOSIS — I825Y1 Chronic embolism and thrombosis of unspecified deep veins of right proximal lower extremity: Secondary | ICD-10-CM

## 2022-11-10 DIAGNOSIS — T451X5A Adverse effect of antineoplastic and immunosuppressive drugs, initial encounter: Secondary | ICD-10-CM

## 2022-11-10 DIAGNOSIS — Z5111 Encounter for antineoplastic chemotherapy: Secondary | ICD-10-CM

## 2022-11-10 DIAGNOSIS — Z7901 Long term (current) use of anticoagulants: Secondary | ICD-10-CM | POA: Insufficient documentation

## 2022-11-10 DIAGNOSIS — R918 Other nonspecific abnormal finding of lung field: Secondary | ICD-10-CM | POA: Diagnosis not present

## 2022-11-10 DIAGNOSIS — Z86718 Personal history of other venous thrombosis and embolism: Secondary | ICD-10-CM | POA: Insufficient documentation

## 2022-11-10 DIAGNOSIS — D702 Other drug-induced agranulocytosis: Secondary | ICD-10-CM

## 2022-11-10 LAB — CMP (CANCER CENTER ONLY)
ALT: 19 U/L (ref 0–44)
AST: 17 U/L (ref 15–41)
Albumin: 3.7 g/dL (ref 3.5–5.0)
Alkaline Phosphatase: 49 U/L (ref 38–126)
Anion gap: 7 (ref 5–15)
BUN: 14 mg/dL (ref 8–23)
CO2: 23 mmol/L (ref 22–32)
Calcium: 8.9 mg/dL (ref 8.9–10.3)
Chloride: 107 mmol/L (ref 98–111)
Creatinine: 1.02 mg/dL (ref 0.61–1.24)
GFR, Estimated: >60 mL/min (ref >60)
Glucose, Bld: 109 mg/dL — ABNORMAL HIGH (ref 70–99)
Potassium: 4.2 mmol/L (ref 3.5–5.1)
Sodium: 137 mmol/L (ref 135–145)
Total Bilirubin: 0.8 mg/dL (ref 0.3–1.2)
Total Protein: 6.6 g/dL (ref 6.5–8.1)

## 2022-11-10 LAB — CBC WITH DIFFERENTIAL (CANCER CENTER ONLY)
Abs Immature Granulocytes: 0 10*3/uL (ref 0.00–0.07)
Basophils Absolute: 0 10*3/uL (ref 0.0–0.1)
Basophils Relative: 1 %
Eosinophils Absolute: 0.2 10*3/uL (ref 0.0–0.5)
Eosinophils Relative: 6 %
HCT: 41.6 % (ref 39.0–52.0)
Hemoglobin: 13.6 g/dL (ref 13.0–17.0)
Immature Granulocytes: 0 %
Lymphocytes Relative: 47 %
Lymphs Abs: 1.3 10*3/uL (ref 0.7–4.0)
MCH: 31.3 pg (ref 26.0–34.0)
MCHC: 32.7 g/dL (ref 30.0–36.0)
MCV: 95.9 fL (ref 80.0–100.0)
Monocytes Absolute: 0.3 10*3/uL (ref 0.1–1.0)
Monocytes Relative: 12 %
Neutro Abs: 0.9 10*3/uL — ABNORMAL LOW (ref 1.7–7.7)
Neutrophils Relative %: 34 %
Platelet Count: 139 10*3/uL — ABNORMAL LOW (ref 150–400)
RBC: 4.34 MIL/uL (ref 4.22–5.81)
RDW: 16.4 % — ABNORMAL HIGH (ref 11.5–15.5)
WBC Count: 2.7 10*3/uL — ABNORMAL LOW (ref 4.0–10.5)
nRBC: 0 % (ref 0.0–0.2)

## 2022-11-10 MED ORDER — LENALIDOMIDE 5 MG PO CAPS
5.0000 mg | ORAL_CAPSULE | Freq: Every day | ORAL | 0 refills | Status: DC
Start: 2022-11-10 — End: 2022-12-20

## 2022-11-10 MED ORDER — APIXABAN 5 MG PO TABS: 5.0000 mg | ORAL_TABLET | Freq: Two times a day (BID) | ORAL | 2 refills | Status: DC

## 2022-11-10 MED ORDER — SODIUM CHLORIDE 0.9 % IV SOLN
INTRAVENOUS | Status: DC | PRN
  Filled 2022-11-10: qty 250

## 2022-11-10 MED ORDER — ZOLEDRONIC ACID 4 MG/100ML IV SOLN
4.0000 mg | Freq: Once | INTRAVENOUS | Status: AC
  Administered 2022-11-10: 4 mg via INTRAVENOUS
  Filled 2022-11-10: qty 100

## 2022-11-10 NOTE — Progress Notes (Signed)
Hematology/Oncology Progress note [phone] [phone]        CHIEF COMPLAINTS/REASON FOR VISIT:  Follow-up for multiple myeloma  ASSESSMENT & PLAN:   Cancer Staging  Multiple myeloma not having achieved remission (HCC) Staging form: Plasma Cell Myeloma and Plasma Cell Disorders, AJCC 8th Edition - Clinical stage from 02/04/2020: Beta-2-microglobulin (mg/L): 2.1, Albumin (g/dL): 2.7, ISS: Stage II, High-risk cytogenetics: Absent, LDH: Unknown - Signed by Rickard Patience, MD on 05/06/2020   Multiple myeloma not having achieved remission (HCC) #IgG multiple myeloma, stage II, del 1 p, high risk.  Declined bone marrow transplant evaluation. 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 Labs are reviewed and discussed with patient. Hold off starting today to allow better marrow recovery he will repeat cbc next week, if ANC is >=1 he can start, I decreased dose to 5mg  3 weeks on 1 week off  Ok to stop Acyclovir since he has been 1 year after transplant.   Zometa Q9months -proceed today.  Next due Nov 2024  Encounter for antineoplastic chemotherapy Treatment as planned.  Chemotherapy-induced neuropathy (HCC) Grade 1, stable symptoms.  Observation  Chronic deep vein thrombosis (DVT) (HCC) Continue anticoagulation with Eliquis 5mg  BID Patient declined repeat lower extremity ultrasound for follow-up of DVT   Drug-induced neutropenia (HCC) Recurrent neutropenia  ANC is decreased, 0.9, discussed neutropenia precaution.  Repeat cbc in 1 week.      Orders Placed This Encounter  Procedures   CBC with Differential (Cancer Center Only)    Standing Status:   Future    Standing  Expiration Date:   11/10/2023    Lab MD 5 weeks   All questions were answered. The patient knows to call the clinic with any problems, questions or concerns.  Rickard Patience, MD, PhD [facility] Hematology Oncology 11/10/2022     HISTORY OF PRESENTING ILLNESS:   Misty Brooks. is a  67 y.o.  male presents for follow-up of management of multiple myeloma Oncology history summary listed as below  Oncology History  Multiple myeloma not having achieved remission (HCC)  01/29/2020 Imaging   CT showed multiple bilateral pulmonary nodules measuring up to 10 mm in the right middle lobe, nodules are indeterminate, infectious/inflammatory etiology versus metastatic disease. Multiple osseous lytic lesions, largest lesion involving the left pubic bone concerning for metastatic disease.Compression fractures at T9, age indeterminate. Appearance focal area of thickening at the gastroesophageal junction.  Further evaluation with upper GI study or direct visualization with ndoscopy is recommended No bowel obstruction.Aortic atherosclerosis.   02/04/2020 Cancer Staging   Staging form: Plasma Cell Myeloma and Plasma Cell Disorders, AJCC 8th Edition - Clinical stage from 02/04/2020: Beta-2-microglobulin (mg/L): 2.1, Albumin (g/dL): 2.7, ISS: Stage II, High-risk cytogenetics: Absent, LDH: Unknown - Signed by Rickard Patience, MD on 05/06/2020   02/21/2020 Initial Diagnosis   Multiple myeloma  Baseline M protein 4.1, kappa light chain level 333.8 Beta-2 microglobulin 2.1, albumin 2.7.  -02/12/2020, bone marrow biopsy showed hypercellular for age, 80% plasma cell which are complex restricted by light chain in situ heparinization.  Background hematopoiesis is present but reduced. Cytogenetics is normal, Myeloma FISH panel-deletion 1P,Standard risk.   02/27/2020 - 05/06/2021 Chemotherapy   VRd     09/01/2020 Bone Marrow Biopsy   Bone  marrow results were reviewed.  1% plasma cell.  Normal cytogenetics.  Myeloma FISH  negative for 1p del.   04/21/2021 Bone Marrow Biopsy   Bone marrow after 14 cycles of VRD -[January 2023 ]showed plasma cells 2%.-   06/25/2021 Bone Marrow Transplant   Patient underwent autologous bone marrow transplant at [facility]   09/2021 -  Chemotherapy   Revlimid 10 mg 3 weeks on 1 week off   10/26/2021 Imaging     10/26/2021 Imaging   Bilateral lower extremity venous US Partially occlusive right deep vein thrombus extending from the calf veins into the common femoral vein with limited examination of the left extremity demonstrating partially occlusive thrombus in the common femoral vein.     01/13/2022 Imaging   Bilateral lower extremity venous US 1. 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 2. Extensive predominantly occlusive/near occlusive left lower extremity DVT extending from the left common femoral vein through the imaged tibial veins, age-indeterminate in the absence of prior examinations though echogenic thrombus within the left femoral vein suggests a chronic etiology.     01/20/2022 Bone Marrow Biopsy   -Bone marrow biopsy at [facility] showed Cellular bone marrow aspirate with trilineage hematopoiesis and no morphologic evidence of plasma cell neoplasm.  -Negative minimal residual disease.    #Focal area of thickening at the GE junction. 10/23/2020 EGD is normal.   #Lung nodules, measures up to 10 mm on the right middle lobe.  Indeterminate.  Attention on follow-up.  Recommend repeat CT chest and he declined  10/28/2011, unilateral right lower extremity ultrasound showed partially occlusive right deep vein thrombosis extending from the calf vein into the, femoral vein. Patient was recommended to start on Eliquis for  anticoagulation.  He tolerates well.  Right thigh pain has improved.  INTERVAL HISTORY Cleon Swarr. is a 67 y.o. male who has above history reviewed by me today presents for follow up visit for multiple myeloma Patient is currently on Revlimid 10 mg 3 weeks on 1 week off for maintenance.Tolerates well.  Current everyday smoker, 10 cigarettes daily.   Patient denies fever chills.  No new complaints.  Review of Systems  Constitutional:  Negative for appetite change, chills, fatigue, fever and unexpected weight change.  HENT:   Negative for hearing loss and voice change.   Eyes:  Negative for eye problems and icterus.  Respiratory:  Negative for chest tightness, cough and shortness of breath.   Cardiovascular:  Negative for chest pain and leg swelling.  Gastrointestinal:  Negative for abdominal distention, abdominal pain and constipation.  Endocrine: Negative for hot flashes.  Genitourinary:  Negative for difficulty urinating, dysuria and frequency.   Musculoskeletal:  Negative for arthralgias, back pain and neck pain.  Skin:  Negative for itching and rash.  Neurological:  Positive for numbness. Negative for light-headedness.  Hematological:  Negative for adenopathy. Does not bruise/bleed easily.  Psychiatric/Behavioral:  Negative for confusion.      MEDICAL HISTORY:  Past Medical History:  Diagnosis Date   Cancer [facility])    Depression    Hypercalcemia 03/02/2020   Hyperlipemia    Hypertension    Hypogonadism in male    Multiple myeloma [facility])     SURGICAL HISTORY: Past Surgical History:  Procedure Laterality Date   BONE MARROW BIOPSY  COLONOSCOPY  07/14/2008   ESOPHAGOGASTRODUODENOSCOPY N/A 10/22/2020   Procedure: ESOPHAGOGASTRODUODENOSCOPY (EGD);  Surgeon: Wyline Mood, MD;  Location: [facility] ENDOSCOPY;  Service: Gastroenterology;  Laterality: N/A;    SOCIAL HISTORY: Social History   Socioeconomic History   Marital status: Married    Spouse name: Cyndia Bent   Number  of children: 3   Years of education: Not on file   Highest education level: Not on file  Occupational History   Not on file  Tobacco Use   Smoking status: Every Day    Current packs/day: 0.50    Average packs/day: 0.5 packs/day for 30.0 years (15.0 ttl pk-yrs)    Types: Cigarettes   Smokeless tobacco: Never  Substance and Sexual Activity   Alcohol use: Yes    Comment: occcassional   Drug use: No   Sexual activity: Not on file  Other Topics Concern   Not on file  Social History Narrative   Not on file   Social Determinants of Health   Financial Resource Strain: Medium Risk (11/10/2022)   Overall Financial Resource Strain (CARDIA)    Difficulty of Paying Living Expenses: Somewhat hard  Food Insecurity: Food Insecurity Present (11/10/2022)   Hunger Vital Sign    Worried About Running Out of Food in the Last Year: Sometimes true    Ran Out of Food in the Last Year: Sometimes true  Transportation Needs: No Transportation Needs (07/05/2021)   Received from [facility], [facility]   PRAPARE - Transportation    In the past 12 months, has lack of transportation kept you from medical appointments or from getting medications?: No    Lack of Transportation (Non-Medical): No  Physical Activity: Not on file  Stress: Not on file  Social Connections: Not on file  Intimate Partner Violence: Not At Risk (11/10/2022)   Humiliation, Afraid, Rape, and Kick questionnaire    Fear of Current or Ex-Partner: No    Emotionally Abused: No    Physically Abused: No    Sexually Abused: No    FAMILY HISTORY: Family History  Problem Relation Age of Onset   Skin cancer Mother    Prostate cancer Father     ALLERGIES:  has No Known Allergies.  MEDICATIONS:  Current Outpatient Medications  Medication Sig Dispense Refill   buPROPion (WELLBUTRIN XL) 300 MG 24 hr tablet Take 300 mg by mouth daily.     calcium carbonate (OS-CAL) 600 MG tablet Take 2 tablets (1,200 mg  total) by mouth daily. 30 tablet 0   cholecalciferol (VITAMIN D3) 25 MCG (1000 UNIT) tablet Take 2,000 Units by mouth daily.     loperamide (IMODIUM) 2 MG capsule Take by mouth.     potassium chloride SA (KLOR-CON M) 20 MEQ tablet Take 1 tablet (20 mEq total) by mouth daily. 30 tablet 1   sertraline (ZOLOFT) 50 MG tablet Take 1 tablet by mouth daily.     apixaban (ELIQUIS) 5 MG TABS tablet Take 1 tablet (5 mg total) by mouth 2 (two) times daily. 60 tablet 2   lenalidomide (REVLIMID) 5 MG capsule Take 1 capsule (5 mg total) by mouth daily. Take for 21 days, then hold for 7 days. Repeat every 28 days. 21 capsule 0   No current facility-administered medications for this visit.     PHYSICAL EXAMINATION: ECOG PERFORMANCE STATUS: 1 - Symptomatic but completely ambulatory Vitals:   11/10/22 1011  BP: 138/89  Pulse: 68  Resp: 18  Temp: (!) 96.7 F (35.9 C)  SpO2: 100%   Filed Weights   11/10/22 1011  Weight: 236 lb 8 oz (107.3 kg)    Physical Exam Constitutional:      General: He is not in acute distress.    Appearance: He is not diaphoretic.  HENT:     Head: Normocephalic and atraumatic.     Nose: Nose normal.     Mouth/Throat:     Pharynx: No oropharyngeal exudate.  Eyes:     General: No scleral icterus.    Pupils: Pupils are equal, round, and reactive to light.  Cardiovascular:     Rate and Rhythm: Normal rate.  Pulmonary:     Effort: Pulmonary effort is normal. No respiratory distress.     Breath sounds: No wheezing.  Abdominal:     General: There is no distension.     Palpations: Abdomen is soft.     Tenderness: There is no abdominal tenderness.  Musculoskeletal:        General: Normal range of motion.     Cervical back: Normal range of motion.  Skin:    General: Skin is warm and dry.     Findings: No erythema.  Neurological:     Mental Status: He is alert and oriented to person, place, and time. Mental status is at baseline.     Cranial Nerves: No cranial nerve  deficit.     Motor: No abnormal muscle tone.     Coordination: Coordination normal.  Psychiatric:        Mood and Affect: Mood and affect normal.       LABORATORY DATA:  I have reviewed the data as listed    Latest Ref Rng & Units 11/10/2022    9:59 AM 09/26/2022   12:45 PM 08/25/2022    1:34 PM  CBC  WBC 4.0 - 10.5 K/uL 2.7  2.9  3.0   Hemoglobin 13.0 - 17.0 g/dL 08.6  57.8  46.9   Hematocrit 39.0 - 52.0 % 41.6  42.6  43.5   Platelets 150 - 400 K/uL 139  144  144       Latest Ref Rng & Units 11/10/2022    9:59 AM 09/26/2022   12:45 PM 08/25/2022    1:34 PM  CMP  Glucose 70 - 99 mg/dL 629  87  66   BUN 8 - 23 mg/dL 14  13  12    Creatinine 0.61 - 1.24 mg/dL 5.28  4.13  2.44   Sodium 135 - 145 mmol/L 137  137  139   Potassium 3.5 - 5.1 mmol/L 4.2  3.7  3.6   Chloride 98 - 111 mmol/L 107  104  106   CO2 22 - 32 mmol/L 23  25  25    Calcium 8.9 - 10.3 mg/dL 8.9  9.7  9.1   Total Protein 6.5 - 8.1 g/dL 6.6  6.9  6.9   Total Bilirubin 0.3 - 1.2 mg/dL 0.8  1.0  1.0   Alkaline Phos 38 - 126 U/L 49  50  54   AST 15 - 41 U/L 17  16  15    ALT 0 - 44 U/L 19  19  15      RADIOGRAPHIC STUDIES: I have personally reviewed the radiological images as listed and agreed with the findings in the report. No results found.

## 2022-11-10 NOTE — Progress Notes (Signed)
[facility] Clinical Social Work  Initial Assessment   Tommy Smith. is a 67 y.o. year old male presenting alone. Clinical Social Work was referred by medical provider for assessment of psychosocial needs.   SDOH (Social Determinants of Health) assessments performed: Yes   SDOH Screenings   Transportation Needs: No Transportation Needs (07/05/2021)   Received from [facility], [facility]  Tobacco Use: High Risk (11/10/2022)     Distress Screen completed: No    02/26/2020    9:08 AM  ONCBCN DISTRESS SCREENING  Screening Type Initial Screening  Distress experienced in past week (1-10) 0      Family/Social Information:  Housing Arrangement: patient lives with his wife. Family members/support persons in your life? Family and Friends Transportation concerns: no  Employment: Retired  Income source: Actor concerns: Yes, current concerns Type of concern: Medical bills and Food Food access concerns: yes Religious or spiritual practice: Patient reports being religious. Services Currently in place:  [facility]  Coping/ Adjustment to diagnosis: Patient understands treatment plan and what happens next? yes Concerns about diagnosis and/or treatment: I'm not especially worried about anything Patient reported stressors: Finances Hopes and/or priorities: Pay bills Patient enjoys time with family/ friends.  He used to enjoy fishing, but can no longer do this. Current coping skills/ strengths: Capable of independent living , Communication skills , General fund of knowledge , Motivation for treatment/growth , and Supportive family/friends     SUMMARY: Current SDOH Barriers:  Financial constraints related to fixed income.  Clinical Social Work Clinical Goal(s):  Explore community resource options for unmet needs related to:  Financial Strain   Interventions: Discussed common feeling and emotions when being diagnosed with  cancer, and the importance of support during treatment Informed patient of the support team roles and support services at [facility] Provided CSW contact information and encouraged patient to call with any questions or concerns Provided patient with information about the [employer].  CSW to make referral.  He would like to receive a [employer] card, which he will receive from [place] at the end of his treatment today.  CSW to send application into the [employer] for a $100 gift card.  Gave him the number for [employer] regarding his medical bills.   Follow Up Plan: Patient will contact CSW with any support or resource needs Patient verbalizes understanding of plan: Yes    Dorothey Baseman, LCSW Clinical Social Worker [facility]

## 2022-11-10 NOTE — Assessment & Plan Note (Addendum)
Recurrent neutropenia  ANC is decreased, 0.9, discussed neutropenia precaution.  Repeat cbc in 1 week.

## 2022-11-10 NOTE — Assessment & Plan Note (Signed)
Grade 1, stable symptoms.  Observation 

## 2022-11-10 NOTE — Assessment & Plan Note (Addendum)
#  IgG multiple myeloma, stage II, del 1 p, high risk.  Declined bone marrow transplant evaluation. 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 Labs are reviewed and discussed with patient. Hold off starting today to allow better marrow recovery he will repeat cbc next week, if ANC is >=1 he can start, I decreased dose to 5mg  3 weeks on 1 week off  Ok to stop Acyclovir since he has been 1 year after transplant.   Zometa Q21months -proceed today.  Next due Nov 2024

## 2022-11-10 NOTE — Assessment & Plan Note (Signed)
Treatment as planned 

## 2022-11-10 NOTE — Assessment & Plan Note (Signed)
Continue anticoagulation with Eliquis 5mg  BID Patient declined repeat lower extremity ultrasound for follow-up of DVT

## 2022-11-17 ENCOUNTER — Inpatient Hospital Stay: Payer: Medicare Other

## 2022-11-17 DIAGNOSIS — C9000 Multiple myeloma not having achieved remission: Secondary | ICD-10-CM | POA: Diagnosis not present

## 2022-11-17 LAB — CBC WITH DIFFERENTIAL (CANCER CENTER ONLY)
Abs Immature Granulocytes: 0.01 10*3/uL (ref 0.00–0.07)
Basophils Absolute: 0 10*3/uL (ref 0.0–0.1)
Basophils Relative: 0 %
Eosinophils Absolute: 0.2 10*3/uL (ref 0.0–0.5)
Eosinophils Relative: 4 %
HCT: 42.7 % (ref 39.0–52.0)
Hemoglobin: 14 g/dL (ref 13.0–17.0)
Immature Granulocytes: 0 %
Lymphocytes Relative: 45 %
Lymphs Abs: 1.5 10*3/uL (ref 0.7–4.0)
MCH: 31.3 pg (ref 26.0–34.0)
MCHC: 32.8 g/dL (ref 30.0–36.0)
MCV: 95.5 fL (ref 80.0–100.0)
Monocytes Absolute: 0.3 10*3/uL (ref 0.1–1.0)
Monocytes Relative: 9 %
Neutro Abs: 1.4 10*3/uL — ABNORMAL LOW (ref 1.7–7.7)
Neutrophils Relative %: 42 %
Platelet Count: 158 10*3/uL (ref 150–400)
RBC: 4.47 MIL/uL (ref 4.22–5.81)
RDW: 15.7 % — ABNORMAL HIGH (ref 11.5–15.5)
WBC Count: 3.4 10*3/uL — ABNORMAL LOW (ref 4.0–10.5)
nRBC: 0 % (ref 0.0–0.2)

## 2022-11-22 NOTE — Progress Notes (Signed)
Called to speak with Laural Benes about the Bobbe Medico, reached his voicemail, left him a message to call me back.

## 2022-11-28 ENCOUNTER — Telehealth: Payer: Self-pay

## 2022-11-28 NOTE — Telephone Encounter (Signed)
Called and left message with patient to see if he has started his current cycle of Revlimid. I informed him that his ANC has improved so if he has not started it then he may start it now. I asked patient  to give [place] a call back and let us know if and when he has started so that we can schedule him to see Dr. Cathie Hoops in 4 weeks after start date.

## 2022-11-28 NOTE — Telephone Encounter (Signed)
-----   Message from Rickard Patience sent at 11/26/2022 10:45 AM EDT ----- Please check if he has started current cycle of Revlimid, his ANC has improved, if he has not started, he may start now, I will see him 4 weeks after his start date of Revlimid. Lab MD same blood work.

## 2022-11-29 ENCOUNTER — Other Ambulatory Visit: Payer: Self-pay

## 2022-11-29 DIAGNOSIS — C9000 Multiple myeloma not having achieved remission: Secondary | ICD-10-CM

## 2022-11-29 NOTE — Telephone Encounter (Signed)
Spoke to pt who states that he will receive medication today and Will start today.   Please schedule pt for appts:  12/27/22: lab @ 10:15, MD @10 :45.   He is aware of appt details.

## 2022-12-16 NOTE — Progress Notes (Signed)
Called to speak with Tommy Smith regarding the Tommy Smith, reached his voicemail, left him a message to return my call.

## 2022-12-20 ENCOUNTER — Other Ambulatory Visit: Payer: Self-pay | Admitting: *Deleted

## 2022-12-20 DIAGNOSIS — C9000 Multiple myeloma not having achieved remission: Secondary | ICD-10-CM

## 2022-12-20 MED ORDER — LENALIDOMIDE 5 MG PO CAPS
5.0000 mg | ORAL_CAPSULE | Freq: Every day | ORAL | 0 refills | Status: DC
Start: 2022-12-20 — End: 2023-01-24

## 2022-12-27 ENCOUNTER — Inpatient Hospital Stay: Payer: Medicare Other | Attending: Oncology

## 2022-12-27 ENCOUNTER — Inpatient Hospital Stay: Payer: Medicare Other

## 2022-12-27 ENCOUNTER — Inpatient Hospital Stay (HOSPITAL_BASED_OUTPATIENT_CLINIC_OR_DEPARTMENT_OTHER): Payer: Medicare Other | Admitting: Oncology

## 2022-12-27 ENCOUNTER — Encounter: Payer: Self-pay | Admitting: Oncology

## 2022-12-27 VITALS — BP 139/91 | HR 73 | Temp 98.5°F | Resp 18 | Wt 243.7 lb

## 2022-12-27 DIAGNOSIS — C9000 Multiple myeloma not having achieved remission: Secondary | ICD-10-CM

## 2022-12-27 DIAGNOSIS — Z23 Encounter for immunization: Secondary | ICD-10-CM | POA: Diagnosis not present

## 2022-12-27 DIAGNOSIS — G620 Drug-induced polyneuropathy: Secondary | ICD-10-CM | POA: Diagnosis not present

## 2022-12-27 DIAGNOSIS — T451X5A Adverse effect of antineoplastic and immunosuppressive drugs, initial encounter: Secondary | ICD-10-CM

## 2022-12-27 DIAGNOSIS — I825Y1 Chronic embolism and thrombosis of unspecified deep veins of right proximal lower extremity: Secondary | ICD-10-CM

## 2022-12-27 DIAGNOSIS — Z7961 Long term (current) use of immunomodulator: Secondary | ICD-10-CM | POA: Insufficient documentation

## 2022-12-27 DIAGNOSIS — F17210 Nicotine dependence, cigarettes, uncomplicated: Secondary | ICD-10-CM | POA: Diagnosis not present

## 2022-12-27 DIAGNOSIS — Z7901 Long term (current) use of anticoagulants: Secondary | ICD-10-CM | POA: Diagnosis not present

## 2022-12-27 DIAGNOSIS — Z9481 Bone marrow transplant status: Secondary | ICD-10-CM

## 2022-12-27 DIAGNOSIS — D702 Other drug-induced agranulocytosis: Secondary | ICD-10-CM

## 2022-12-27 DIAGNOSIS — Z79624 Long term (current) use of inhibitors of nucleotide synthesis: Secondary | ICD-10-CM | POA: Diagnosis not present

## 2022-12-27 DIAGNOSIS — Z5111 Encounter for antineoplastic chemotherapy: Secondary | ICD-10-CM

## 2022-12-27 DIAGNOSIS — Z79899 Other long term (current) drug therapy: Secondary | ICD-10-CM | POA: Diagnosis not present

## 2022-12-27 DIAGNOSIS — Z86718 Personal history of other venous thrombosis and embolism: Secondary | ICD-10-CM | POA: Diagnosis not present

## 2022-12-27 LAB — CMP (CANCER CENTER ONLY)
ALT: 16 U/L (ref 0–44)
AST: 14 U/L — ABNORMAL LOW (ref 15–41)
Albumin: 3.8 g/dL (ref 3.5–5.0)
Alkaline Phosphatase: 54 U/L (ref 38–126)
Anion gap: 5 (ref 5–15)
BUN: 13 mg/dL (ref 8–23)
CO2: 24 mmol/L (ref 22–32)
Calcium: 8.8 mg/dL — ABNORMAL LOW (ref 8.9–10.3)
Chloride: 105 mmol/L (ref 98–111)
Creatinine: 1.1 mg/dL (ref 0.61–1.24)
GFR, Estimated: >60 mL/min (ref >60)
Glucose, Bld: 88 mg/dL (ref 70–99)
Potassium: 3.8 mmol/L (ref 3.5–5.1)
Sodium: 134 mmol/L — ABNORMAL LOW (ref 135–145)
Total Bilirubin: 0.9 mg/dL (ref 0.3–1.2)
Total Protein: 6.9 g/dL (ref 6.5–8.1)

## 2022-12-27 LAB — CBC WITH DIFFERENTIAL (CANCER CENTER ONLY)
Abs Immature Granulocytes: 0.01 10*3/uL (ref 0.00–0.07)
Basophils Absolute: 0 10*3/uL (ref 0.0–0.1)
Basophils Relative: 1 %
Eosinophils Absolute: 0.2 10*3/uL (ref 0.0–0.5)
Eosinophils Relative: 7 %
HCT: 43.1 % (ref 39.0–52.0)
Hemoglobin: 14 g/dL (ref 13.0–17.0)
Immature Granulocytes: 0 %
Lymphocytes Relative: 36 %
Lymphs Abs: 1.1 10*3/uL (ref 0.7–4.0)
MCH: 31.3 pg (ref 26.0–34.0)
MCHC: 32.5 g/dL (ref 30.0–36.0)
MCV: 96.4 fL (ref 80.0–100.0)
Monocytes Absolute: 0.3 10*3/uL (ref 0.1–1.0)
Monocytes Relative: 10 %
Neutro Abs: 1.3 10*3/uL — ABNORMAL LOW (ref 1.7–7.7)
Neutrophils Relative %: 46 %
Platelet Count: 146 10*3/uL — ABNORMAL LOW (ref 150–400)
RBC: 4.47 MIL/uL (ref 4.22–5.81)
RDW: 15.6 % — ABNORMAL HIGH (ref 11.5–15.5)
WBC Count: 3 10*3/uL — ABNORMAL LOW (ref 4.0–10.5)
nRBC: 0 % (ref 0.0–0.2)

## 2022-12-27 MED ORDER — INFLUENZA VAC A&B SURF ANT ADJ 0.5 ML IM SUSY
0.5000 mL | PREFILLED_SYRINGE | Freq: Once | INTRAMUSCULAR | Status: AC
  Administered 2022-12-27: 0.5 mL via INTRAMUSCULAR
  Filled 2022-12-27: qty 0.5

## 2022-12-27 MED ORDER — PREDNISONE 10 MG (21) PO TBPK: ORAL_TABLET | ORAL | 0 refills | Status: DC

## 2022-12-27 NOTE — Progress Notes (Signed)
Hematology/Oncology Progress note [phone] [phone]        CHIEF COMPLAINTS/REASON FOR VISIT:  Follow-up for multiple myeloma  ASSESSMENT & PLAN:   Cancer Staging  Multiple myeloma not having achieved remission (HCC) Staging form: Plasma Cell Myeloma and Plasma Cell Disorders, AJCC 8th Edition - Clinical stage from 02/04/2020: Beta-2-microglobulin (mg/L): 2.1, Albumin (g/dL): 2.7, ISS: Stage II, High-risk cytogenetics: Absent, LDH: Unknown - Signed by Rickard Patience, MD on 05/06/2020   Multiple myeloma not having achieved remission (HCC) #IgG multiple myeloma, stage II, del 1 p, high risk.  Declined bone marrow transplant evaluation. 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 Labs are reviewed and discussed with patient. 12/31/2022 He will start next cycle of Revlimid  5mg  3 weeks on 1 week off [ dose reduced due to neutropenia] Off Acyclovir since he has been 1 year after transplant.   Zometa Q53months -proceed today.  Next due Nov 2024  Encounter for antineoplastic chemotherapy Treatment as planned.  History of bone marrow transplant [facility]) Patient follows up with oncology for post marrow transplant vaccination. Ok to stop acyclovir 400 mg twice daily   Chemotherapy-induced neuropathy (HCC) Grade 1, stable symptoms.  Observation  Chronic deep vein thrombosis (DVT) (HCC) Continue anticoagulation with Eliquis 5mg  BID Patient declined repeat lower extremity ultrasound for follow-up of DVT   Drug-induced neutropenia (HCC) Improved ANC. monitor     Orders Placed This Encounter  Procedures   CMP (Cancer Center only)    Standing Status:   Future    Standing Expiration  Date:   12/27/2023   CBC with Differential (Cancer Center Only)    Standing Status:   Future    Standing Expiration Date:   12/27/2023   Kappa/lambda light chains    Standing Status:   Future    Standing Expiration Date:   12/27/2023   Multiple Myeloma Panel (SPEP&IFE w/QIG)    Standing Status:   Future    Standing Expiration Date:   12/27/2023    Lab MD 4 weeks   All questions were answered. The patient knows to call the clinic with any problems, questions or concerns.  Rickard Patience, MD, PhD [facility] Hematology Oncology 12/27/2022     HISTORY OF PRESENTING ILLNESS:   Tommy Hayman. is a  67 y.o.  male presents for follow-up of management of multiple myeloma Oncology history summary listed as below  Oncology History  Multiple myeloma not having achieved remission (HCC)  01/29/2020 Imaging   CT showed multiple bilateral pulmonary nodules measuring up to 10 mm in the right middle lobe, nodules are indeterminate, infectious/inflammatory etiology versus metastatic disease. Multiple osseous lytic lesions, largest lesion involving the left pubic bone concerning for metastatic disease.Compression fractures at T9, age indeterminate. Appearance focal area of thickening at the gastroesophageal junction.  Further evaluation with upper GI study or direct visualization with ndoscopy is recommended No bowel obstruction.Aortic atherosclerosis.   02/04/2020 Cancer Staging   Staging form: Plasma Cell Myeloma and Plasma Cell Disorders, AJCC 8th Edition - Clinical stage from 02/04/2020: Beta-2-microglobulin (mg/L): 2.1, Albumin (g/dL): 2.7, ISS: Stage II, High-risk cytogenetics: Absent, LDH: Unknown - Signed by Rickard Patience, MD on 05/06/2020   02/21/2020 Initial Diagnosis   Multiple myeloma  Baseline M protein 4.1,  kappa light chain level 333.8 Beta-2 microglobulin 2.1, albumin 2.7.  -02/12/2020, bone marrow biopsy showed hypercellular for age, 80% plasma cell which are complex restricted by light  chain in situ heparinization.  Background hematopoiesis is present but reduced. Cytogenetics is normal, Myeloma FISH panel-deletion 1P,Standard risk.   02/27/2020 - 05/06/2021 Chemotherapy   VRd     09/01/2020 Bone Marrow Biopsy   Bone marrow results were reviewed.  1% plasma cell.  Normal cytogenetics.  Myeloma FISH negative for 1p del.   04/21/2021 Bone Marrow Biopsy   Bone marrow after 14 cycles of VRD -[January 2023 ]showed plasma cells 2%.-   06/25/2021 Bone Marrow Transplant   Patient underwent autologous bone marrow transplant at [facility]   09/2021 - 11/10/2022 Chemotherapy   Revlimid 10 mg 3 weeks on 1 week off   10/26/2021 Imaging   Bilateral lower extremity venous US Partially occlusive right deep vein thrombus extending from the calf veins into the common femoral vein with limited examination of the left extremity demonstrating partially occlusive thrombus in the common femoral vein.     01/13/2022 Imaging   Bilateral lower extremity venous US 1. 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 2. Extensive predominantly occlusive/near occlusive left lower extremity DVT extending from the left common femoral vein through the imaged tibial veins, age-indeterminate in the absence of prior examinations though echogenic thrombus within the left femoral vein suggests a chronic etiology.     01/20/2022 Bone Marrow Biopsy   -Bone marrow biopsy at [facility] showed Cellular bone marrow aspirate with trilineage hematopoiesis and no morphologic evidence of plasma cell neoplasm.  -Negative minimal residual disease.   12/03/2022 -  Chemotherapy   Revlimid 5mg  3 weeks on, 1 week off.     #Focal area of thickening at the GE junction. 10/23/2020 EGD  is normal.   #Lung nodules, measures up to 10 mm on the right middle lobe.  Indeterminate.  Attention on follow-up.  Recommend repeat CT chest and he declined  10/28/2011, unilateral right lower extremity ultrasound showed partially occlusive right deep vein thrombosis extending from the calf vein into the, femoral vein. Patient was recommended to start on Eliquis for anticoagulation.  He tolerates well.  Right thigh pain has improved.  INTERVAL HISTORY Tommy Smith. is a 67 y.o. male who has above history reviewed by me today presents for follow up visit for multiple myeloma Patient is currently on Revlimid 5 mg 3 weeks on 1 week off for maintenance.Tolerates well.  Current everyday smoker, 10 cigarettes daily.   Patient denies fever chills.  No new complaints.  Review of Systems  Constitutional:  Negative for appetite change, chills, fatigue, fever and unexpected weight change.  HENT:   Negative for hearing loss and voice change.   Eyes:  Negative for eye problems and icterus.  Respiratory:  Negative for chest tightness, cough and shortness of breath.   Cardiovascular:  Negative for chest pain and leg swelling.  Gastrointestinal:  Negative for abdominal distention, abdominal pain and constipation.  Endocrine: Negative for hot flashes.  Genitourinary:  Negative for difficulty urinating, dysuria and frequency.   Musculoskeletal:  Negative for arthralgias, back pain and neck pain.  Skin:  Negative for itching and rash.  Neurological:  Positive for numbness. Negative for light-headedness.  Hematological:  Negative for  adenopathy. Does not bruise/bleed easily.  Psychiatric/Behavioral:  Negative for confusion.      MEDICAL HISTORY:  Past Medical History:  Diagnosis Date   Cancer (HCC)    Depression    Hypercalcemia 03/02/2020   Hyperlipemia    Hypertension    Hypogonadism in male    Multiple myeloma [facility])     SURGICAL HISTORY: Past Surgical History:  Procedure Laterality  Date   BONE MARROW BIOPSY     COLONOSCOPY  07/14/2008   ESOPHAGOGASTRODUODENOSCOPY N/A 10/22/2020   Procedure: ESOPHAGOGASTRODUODENOSCOPY (EGD);  Surgeon: Wyline Mood, MD;  Location: [facility];  Service: Gastroenterology;  Laterality: N/A;    SOCIAL HISTORY: Social History   Socioeconomic History   Marital status: Married    Spouse name: Cyndia Bent   Number of children: 3   Years of education: Not on file   Highest education level: Not on file  Occupational History   Not on file  Tobacco Use   Smoking status: Every Day    Current packs/day: 0.50    Average packs/day: 0.5 packs/day for 30.0 years (15.0 ttl pk-yrs)    Types: Cigarettes   Smokeless tobacco: Never  Substance and Sexual Activity   Alcohol use: Yes    Comment: occcassional   Drug use: No   Sexual activity: Not on file  Other Topics Concern   Not on file  Social History Narrative   Not on file   Social Determinants of Health   Financial Resource Strain: Medium Risk (11/10/2022)   Overall Financial Resource Strain (CARDIA)    Difficulty of Paying Living Expenses: Somewhat hard  Food Insecurity: Food Insecurity Present (11/10/2022)   Hunger Vital Sign    Worried About Running Out of Food in the Last Year: Sometimes true    Ran Out of Food in the Last Year: Sometimes true  Transportation Needs: No Transportation Needs (07/05/2021)   Received from [facility]   PRAPARE - Transportation    In the past 12 months, has lack of transportation kept you from medical appointments or from getting medications?: No    Lack of Transportation (Non-Medical): No  Physical Activity: Not on file  Stress: Not on file  Social Connections: Not on file  Intimate Partner Violence: Not At Risk (11/10/2022)   Humiliation, Afraid, Rape, and Kick questionnaire    Fear of Current or Ex-Partner: No    Emotionally Abused: No    Physically Abused: No    Sexually Abused: No    FAMILY  HISTORY: Family History  Problem Relation Age of Onset   Skin cancer Mother    Prostate cancer Father     ALLERGIES:  has No Known Allergies.  MEDICATIONS:  Current Outpatient Medications  Medication Sig Dispense Refill   apixaban (ELIQUIS) 5 MG TABS tablet Take 1 tablet (5 mg total) by mouth 2 (two) times daily. 60 tablet 2   buPROPion (WELLBUTRIN XL) 300 MG 24 hr tablet Take 300 mg by mouth daily.     cholecalciferol (VITAMIN D3) 25 MCG (1000 UNIT) tablet Take 2,000 Units by mouth daily.     lenalidomide (REVLIMID) 5 MG capsule Take 1 capsule (5 mg total) by mouth daily. Take for 21 days, then hold for 7 days. Repeat every 28 days. 21 capsule 0   predniSONE (STERAPRED UNI-PAK 21 TAB) 10 MG (21) TBPK tablet Day 1 take 6 tablets, Day 2 take 5 tablets, Day 3 take 4 tablets, Day 4 take 3 tablets, Day 5 take  2 tablets D6 take 1 tablet 1 each 0   sertraline (ZOLOFT) 50 MG tablet Take 1 tablet by mouth daily.     No current facility-administered medications for this visit.     PHYSICAL EXAMINATION: ECOG PERFORMANCE STATUS: 1 - Symptomatic but completely ambulatory Vitals:   12/27/22 1033  BP: (!) 139/91  Pulse: 73  Resp: 18  Temp: 98.5 F (36.9 C)  SpO2: 99%   Filed Weights   12/27/22 1033  Weight: 243 lb 11.2 oz (110.5 kg)    Physical Exam Constitutional:      General: He is not in acute distress.    Appearance: He is not diaphoretic.  HENT:     Head: Normocephalic and atraumatic.     Nose: Nose normal.     Mouth/Throat:     Pharynx: No oropharyngeal exudate.  Eyes:     General: No scleral icterus.    Pupils: Pupils are equal, round, and reactive to light.  Cardiovascular:     Rate and Rhythm: Normal rate.  Pulmonary:     Effort: Pulmonary effort is normal. No respiratory distress.     Breath sounds: No wheezing.  Abdominal:     General: There is no distension.     Palpations: Abdomen is soft.     Tenderness: There is no abdominal tenderness.  Musculoskeletal:         General: Normal range of motion.     Cervical back: Normal range of motion.  Skin:    General: Skin is warm and dry.     Findings: No erythema.  Neurological:     Mental Status: He is alert and oriented to person, place, and time. Mental status is at baseline.     Cranial Nerves: No cranial nerve deficit.     Motor: No abnormal muscle tone.     Coordination: Coordination normal.  Psychiatric:        Mood and Affect: Mood and affect normal.       LABORATORY DATA:  I have reviewed the data as listed    Latest Ref Rng & Units 12/27/2022   10:08 AM 11/17/2022   10:31 AM 11/10/2022    9:59 AM  CBC  WBC 4.0 - 10.5 K/uL 3.0  3.4  2.7   Hemoglobin 13.0 - 17.0 g/dL 16.1  09.6  04.5   Hematocrit 39.0 - 52.0 % 43.1  42.7  41.6   Platelets 150 - 400 K/uL 146  158  139       Latest Ref Rng & Units 12/27/2022   10:07 AM 11/10/2022    9:59 AM 09/26/2022   12:45 PM  CMP  Glucose 70 - 99 mg/dL 88  409  87   BUN 8 - 23 mg/dL 13  14  13    Creatinine 0.61 - 1.24 mg/dL 8.11  9.14  7.82   Sodium 135 - 145 mmol/L 134  137  137   Potassium 3.5 - 5.1 mmol/L 3.8  4.2  3.7   Chloride 98 - 111 mmol/L 105  107  104   CO2 22 - 32 mmol/L 24  23  25    Calcium 8.9 - 10.3 mg/dL 8.8  8.9  9.7   Total Protein 6.5 - 8.1 g/dL 6.9  6.6  6.9   Total Bilirubin 0.3 - 1.2 mg/dL 0.9  0.8  1.0   Alkaline Phos 38 - 126 U/L 54  49  50   AST 15 - 41 U/L 14  17  16  ALT 0 - 44 U/L 16  19  19      RADIOGRAPHIC STUDIES: I have personally reviewed the radiological images as listed and agreed with the findings in the report. No results found.

## 2022-12-27 NOTE — Assessment & Plan Note (Signed)
Influenza vaccination today

## 2022-12-27 NOTE — Assessment & Plan Note (Signed)
Patient follows up with oncology for post marrow transplant vaccination. Ok to stop acyclovir 400 mg twice daily

## 2022-12-27 NOTE — Assessment & Plan Note (Signed)
Treatment as planned

## 2022-12-27 NOTE — Assessment & Plan Note (Addendum)
#  IgG multiple myeloma, stage II, del 1 p, high risk.  Declined bone marrow transplant evaluation. 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 Labs are reviewed and discussed with patient. 12/31/2022 He will start next cycle of Revlimid  5mg  3 weeks on 1 week off [ dose reduced due to neutropenia] Off Acyclovir since he has been 1 year after transplant.   Zometa Q12months -proceed today.  Next due Nov 2024

## 2022-12-27 NOTE — Assessment & Plan Note (Signed)
Continue anticoagulation with Eliquis 5mg  BID Patient declined repeat lower extremity ultrasound for follow-up of DVT

## 2022-12-27 NOTE — Assessment & Plan Note (Signed)
Improved ANC. monitor

## 2022-12-27 NOTE — Assessment & Plan Note (Signed)
Grade 1, stable symptoms.  Observation

## 2023-01-01 LAB — MULTIPLE MYELOMA PANEL, SERUM
Albumin SerPl Elph-Mcnc: 3.3 g/dL (ref 2.9–4.4)
Albumin/Glob SerPl: 1.4 (ref 0.7–1.7)
Alpha 1: 0.2 g/dL (ref 0.0–0.4)
Alpha2 Glob SerPl Elph-Mcnc: 0.6 g/dL (ref 0.4–1.0)
B-Globulin SerPl Elph-Mcnc: 1.1 g/dL (ref 0.7–1.3)
Gamma Glob SerPl Elph-Mcnc: 0.5 g/dL (ref 0.4–1.8)
Globulin, Total: 2.5 g/dL (ref 2.2–3.9)
IgA: 271 mg/dL (ref 61–437)
IgG (Immunoglobin G), Serum: 637 mg/dL (ref 603–1613)
IgM (Immunoglobulin M), Srm: 6 mg/dL — ABNORMAL LOW (ref 20–172)
Total Protein ELP: 5.8 g/dL — ABNORMAL LOW (ref 6.0–8.5)

## 2023-01-24 ENCOUNTER — Other Ambulatory Visit: Payer: Self-pay | Admitting: *Deleted

## 2023-01-24 DIAGNOSIS — C9000 Multiple myeloma not having achieved remission: Secondary | ICD-10-CM

## 2023-01-24 MED ORDER — LENALIDOMIDE 5 MG PO CAPS: 5.0000 mg | ORAL_CAPSULE | Freq: Every day | ORAL | 0 refills | Status: DC

## 2023-01-27 ENCOUNTER — Inpatient Hospital Stay: Payer: Medicare Other | Attending: Oncology

## 2023-01-27 ENCOUNTER — Inpatient Hospital Stay (HOSPITAL_BASED_OUTPATIENT_CLINIC_OR_DEPARTMENT_OTHER): Payer: Medicare Other | Admitting: Oncology

## 2023-01-27 ENCOUNTER — Encounter: Payer: Self-pay | Admitting: Oncology

## 2023-01-27 VITALS — BP 142/97 | HR 62 | Temp 98.6°F | Resp 20 | Wt 247.2 lb

## 2023-01-27 DIAGNOSIS — C9000 Multiple myeloma not having achieved remission: Secondary | ICD-10-CM | POA: Diagnosis present

## 2023-01-27 DIAGNOSIS — Z5111 Encounter for antineoplastic chemotherapy: Secondary | ICD-10-CM | POA: Diagnosis not present

## 2023-01-27 DIAGNOSIS — Z9221 Personal history of antineoplastic chemotherapy: Secondary | ICD-10-CM | POA: Diagnosis not present

## 2023-01-27 DIAGNOSIS — Z9481 Bone marrow transplant status: Secondary | ICD-10-CM | POA: Insufficient documentation

## 2023-01-27 DIAGNOSIS — D702 Other drug-induced agranulocytosis: Secondary | ICD-10-CM

## 2023-01-27 DIAGNOSIS — Z8042 Family history of malignant neoplasm of prostate: Secondary | ICD-10-CM | POA: Insufficient documentation

## 2023-01-27 DIAGNOSIS — Z7901 Long term (current) use of anticoagulants: Secondary | ICD-10-CM | POA: Insufficient documentation

## 2023-01-27 DIAGNOSIS — R918 Other nonspecific abnormal finding of lung field: Secondary | ICD-10-CM | POA: Insufficient documentation

## 2023-01-27 DIAGNOSIS — Z808 Family history of malignant neoplasm of other organs or systems: Secondary | ICD-10-CM | POA: Insufficient documentation

## 2023-01-27 DIAGNOSIS — F17210 Nicotine dependence, cigarettes, uncomplicated: Secondary | ICD-10-CM | POA: Diagnosis not present

## 2023-01-27 DIAGNOSIS — I825Y1 Chronic embolism and thrombosis of unspecified deep veins of right proximal lower extremity: Secondary | ICD-10-CM | POA: Diagnosis not present

## 2023-01-27 DIAGNOSIS — G620 Drug-induced polyneuropathy: Secondary | ICD-10-CM

## 2023-01-27 DIAGNOSIS — Z86718 Personal history of other venous thrombosis and embolism: Secondary | ICD-10-CM | POA: Diagnosis not present

## 2023-01-27 DIAGNOSIS — T451X5A Adverse effect of antineoplastic and immunosuppressive drugs, initial encounter: Secondary | ICD-10-CM

## 2023-01-27 LAB — CBC WITH DIFFERENTIAL (CANCER CENTER ONLY)
Abs Immature Granulocytes: 0 10*3/uL (ref 0.00–0.07)
Basophils Absolute: 0 10*3/uL (ref 0.0–0.1)
Basophils Relative: 1 %
Eosinophils Absolute: 0.3 10*3/uL (ref 0.0–0.5)
Eosinophils Relative: 8 %
HCT: 41.4 % (ref 39.0–52.0)
Hemoglobin: 13.3 g/dL (ref 13.0–17.0)
Immature Granulocytes: 0 %
Lymphocytes Relative: 41 %
Lymphs Abs: 1.3 10*3/uL (ref 0.7–4.0)
MCH: 30.4 pg (ref 26.0–34.0)
MCHC: 32.1 g/dL (ref 30.0–36.0)
MCV: 94.7 fL (ref 80.0–100.0)
Monocytes Absolute: 0.3 10*3/uL (ref 0.1–1.0)
Monocytes Relative: 10 %
Neutro Abs: 1.2 10*3/uL — ABNORMAL LOW (ref 1.7–7.7)
Neutrophils Relative %: 40 %
Platelet Count: 161 10*3/uL (ref 150–400)
RBC: 4.37 MIL/uL (ref 4.22–5.81)
RDW: 15.2 % (ref 11.5–15.5)
WBC Count: 3.1 10*3/uL — ABNORMAL LOW (ref 4.0–10.5)
nRBC: 0 % (ref 0.0–0.2)

## 2023-01-27 LAB — CMP (CANCER CENTER ONLY)
ALT: 17 U/L (ref 0–44)
AST: 19 U/L (ref 15–41)
Albumin: 3.8 g/dL (ref 3.5–5.0)
Alkaline Phosphatase: 60 U/L (ref 38–126)
Anion gap: 6 (ref 5–15)
BUN: 13 mg/dL (ref 8–23)
CO2: 24 mmol/L (ref 22–32)
Calcium: 8.7 mg/dL — ABNORMAL LOW (ref 8.9–10.3)
Chloride: 107 mmol/L (ref 98–111)
Creatinine: 1.11 mg/dL (ref 0.61–1.24)
GFR, Estimated: >60 mL/min (ref >60)
Glucose, Bld: 92 mg/dL (ref 70–99)
Potassium: 3.8 mmol/L (ref 3.5–5.1)
Sodium: 137 mmol/L (ref 135–145)
Total Bilirubin: 0.8 mg/dL (ref 0.3–1.2)
Total Protein: 6.8 g/dL (ref 6.5–8.1)

## 2023-01-27 MED ORDER — LENALIDOMIDE 5 MG PO CAPS: 5.0000 mg | ORAL_CAPSULE | Freq: Every day | ORAL | 6 refills | Status: DC

## 2023-01-27 NOTE — Assessment & Plan Note (Signed)
Continue anticoagulation with Eliquis 5mg  BID Patient declined repeat lower extremity ultrasound for follow-up of DVT

## 2023-01-27 NOTE — Assessment & Plan Note (Signed)
Grade 1, stable symptoms.  Observation 

## 2023-01-27 NOTE — Assessment & Plan Note (Signed)
Treatment as planned 

## 2023-01-27 NOTE — Assessment & Plan Note (Signed)
#  IgG multiple myeloma, stage II, del 1 p, high risk.  Declined bone marrow transplant evaluation. 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 Labs are reviewed and discussed with patient. 12/31/2022 He will start next cycle of Revlimid  5mg  3 weeks on 1 week off [ dose reduced due to neutropenia] Off Acyclovir since he has been 1 year after transplant.   Zometa Q12months -proceed today.  Next due Nov 2024

## 2023-01-27 NOTE — Assessment & Plan Note (Signed)
Patient follows up with oncology for post marrow transplant vaccination. Off acyclovir 400 mg twice daily

## 2023-01-27 NOTE — Assessment & Plan Note (Signed)
Stable ANC. monitor

## 2023-01-27 NOTE — Progress Notes (Signed)
Hematology/Oncology Progress note Telephone:([phone]) [phone] Fax:([phone]) [phone]        CHIEF COMPLAINTS/REASON FOR VISIT:  Follow-up for multiple myeloma  ASSESSMENT & PLAN:   Cancer Staging  Multiple myeloma not having achieved remission (HCC) Staging form: Plasma Cell Myeloma and Plasma Cell Disorders, AJCC 8th Edition - Clinical stage from 02/04/2020: Beta-2-microglobulin (mg/L): 2.1, Albumin (g/dL): 2.7, ISS: Stage II, High-risk cytogenetics: Absent, LDH: Unknown - Signed by Rickard Patience, MD on 05/06/2020   Chronic deep vein thrombosis (DVT) (HCC) Continue anticoagulation with Eliquis 5mg  BID Patient declined repeat lower extremity ultrasound for follow-up of DVT   Multiple myeloma not having achieved remission (HCC) #IgG multiple myeloma, stage II, del 1 p, high risk.  Declined bone marrow transplant evaluation. 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 Labs are reviewed and discussed with patient. 12/31/2022 He will start next cycle of Revlimid  5mg  3 weeks on 1 week off [ dose reduced due to neutropenia] Off Acyclovir since he has been 1 year after transplant.   Zometa Q28months -proceed today.  Next due Nov 2024  Encounter for antineoplastic chemotherapy Treatment as planned.  History of bone marrow transplant [facility]) Patient follows up with oncology for post marrow transplant vaccination. Off acyclovir 400 mg twice daily   Chemotherapy-induced neuropathy (HCC) Grade 1, stable symptoms.  Observation  Drug-induced neutropenia (HCC) Stable ANC. monitor     Orders Placed This Encounter  Procedures   CBC with Differential (Cancer Center Only)    Standing Status:   Future    Standing  Expiration Date:   01/27/2024   CMP (Cancer Center only)    Standing Status:   Future    Standing Expiration Date:   01/27/2024   Multiple Myeloma Panel (SPEP&IFE w/QIG)    Standing Status:   Future    Standing Expiration Date:   01/27/2024   Kappa/lambda light chains    Standing Status:   Future    Standing Expiration Date:   01/27/2024    Lab MD 4 weeks   All questions were answered. The patient knows to call the clinic with any problems, questions or concerns.  Rickard Patience, MD, PhD [facility] Hematology Oncology 01/27/2023     HISTORY OF PRESENTING ILLNESS:   Tommy Zamir. is a  67 y.o.  male presents for follow-up of management of multiple myeloma Oncology history summary listed as below  Oncology History  Multiple myeloma not having achieved remission (HCC)  01/29/2020 Imaging   CT showed multiple bilateral pulmonary nodules measuring up to 10 mm in the right middle lobe, nodules are indeterminate, infectious/inflammatory etiology versus metastatic disease. Multiple osseous lytic lesions, largest lesion involving the left pubic bone concerning for metastatic disease.Compression fractures at T9, age indeterminate. Appearance focal area of thickening at the gastroesophageal junction.  Further evaluation with upper GI study or direct visualization with ndoscopy is recommended No bowel obstruction.Aortic atherosclerosis.   02/04/2020 Cancer Staging   Staging form: Plasma Cell Myeloma and Plasma Cell Disorders, AJCC 8th Edition - Clinical stage from 02/04/2020: Beta-2-microglobulin (mg/L): 2.1, Albumin (g/dL): 2.7, ISS: Stage II, High-risk cytogenetics: Absent, LDH: Unknown - Signed by Rickard Patience, MD on 05/06/2020   02/21/2020 Initial Diagnosis   Multiple myeloma  Baseline M protein 4.1, kappa light  chain level 333.8 Beta-2 microglobulin 2.1, albumin 2.7.  -02/12/2020, bone marrow biopsy showed hypercellular for age, 80% plasma cell which are complex restricted by light chain  in situ heparinization.  Background hematopoiesis is present but reduced. Cytogenetics is normal, Myeloma FISH panel-deletion 1P,Standard risk.   02/27/2020 - 05/06/2021 Chemotherapy   VRd     09/01/2020 Bone Marrow Biopsy   Bone marrow results were reviewed.  1% plasma cell.  Normal cytogenetics.  Myeloma FISH negative for 1p del.   04/21/2021 Bone Marrow Biopsy   Bone marrow after 14 cycles of VRD -[January 2023 ]showed plasma cells 2%.-   06/25/2021 Bone Marrow Transplant   Patient underwent autologous bone marrow transplant at [facility]   09/2021 - 11/10/2022 Chemotherapy   Revlimid 10 mg 3 weeks on 1 week off   10/26/2021 Imaging   Bilateral lower extremity venous US Partially occlusive right deep vein thrombus extending from the calf veins into the common femoral vein with limited examination of the left extremity demonstrating partially occlusive thrombus in the common femoral vein.     01/13/2022 Imaging   Bilateral lower extremity venous US 1. 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 2. Extensive predominantly occlusive/near occlusive left lower extremity DVT extending from the left common femoral vein through the imaged tibial veins, age-indeterminate in the absence of prior examinations though echogenic thrombus within the left femoral vein suggests a chronic etiology.     01/20/2022 Bone Marrow Biopsy   -Bone marrow biopsy at [facility] showed Cellular bone marrow aspirate with trilineage hematopoiesis and no morphologic evidence of plasma cell neoplasm.  -Negative minimal residual disease.   12/03/2022 -  Chemotherapy   Revlimid 5mg  3 weeks on, 1 week off.     #Focal area of thickening at the GE junction. 10/23/2020 EGD is  normal.   #Lung nodules, measures up to 10 mm on the right middle lobe.  Indeterminate.  Attention on follow-up.  Recommend repeat CT chest and he declined  10/28/2011, unilateral right lower extremity ultrasound showed partially occlusive right deep vein thrombosis extending from the calf vein into the, femoral vein. Patient was recommended to start on Eliquis for anticoagulation.  He tolerates well.  Right thigh pain has improved.  INTERVAL HISTORY Tommy Yahola. is a 67 y.o. male who has above history reviewed by me today presents for follow up visit for multiple myeloma Patient is currently on Revlimid 5 mg 3 weeks on 1 week off for maintenance.Tolerates well.  Current everyday smoker, 10 cigarettes daily.   Patient denies fever chills.  No new complaints.  Review of Systems  Constitutional:  Negative for appetite change, chills, fatigue, fever and unexpected weight change.  HENT:   Negative for hearing loss and voice change.   Eyes:  Negative for eye problems and icterus.  Respiratory:  Negative for chest tightness, cough and shortness of breath.   Cardiovascular:  Negative for chest pain and leg swelling.  Gastrointestinal:  Negative for abdominal distention, abdominal pain and constipation.  Endocrine: Negative for hot flashes.  Genitourinary:  Negative for difficulty urinating, dysuria and frequency.   Musculoskeletal:  Negative for arthralgias, back pain and neck pain.  Skin:  Negative for itching and rash.  Neurological:  Positive for numbness. Negative for light-headedness.  Hematological:  Negative for adenopathy. Does  not bruise/bleed easily.  Psychiatric/Behavioral:  Negative for confusion.      MEDICAL HISTORY:  Past Medical History:  Diagnosis Date   Cancer (HCC)    Depression    Hypercalcemia 03/02/2020   Hyperlipemia    Hypertension    Hypogonadism in male    Multiple myeloma [facility])     SURGICAL HISTORY: Past Surgical History:  Procedure Laterality Date    BONE MARROW BIOPSY     COLONOSCOPY  07/14/2008   ESOPHAGOGASTRODUODENOSCOPY N/A 10/22/2020   Procedure: ESOPHAGOGASTRODUODENOSCOPY (EGD);  Surgeon: Wyline Mood, MD;  Location: [facility] ENDOSCOPY;  Service: Gastroenterology;  Laterality: N/A;    SOCIAL HISTORY: Social History   Socioeconomic History   Marital status: Married    Spouse name: Cyndia Bent   Number of children: 3   Years of education: Not on file   Highest education level: Not on file  Occupational History   Not on file  Tobacco Use   Smoking status: Every Day    Current packs/day: 0.50    Average packs/day: 0.5 packs/day for 30.0 years (15.0 ttl pk-yrs)    Types: Cigarettes   Smokeless tobacco: Never  Substance and Sexual Activity   Alcohol use: Yes    Comment: occcassional   Drug use: No   Sexual activity: Not on file  Other Topics Concern   Not on file  Social History Narrative   Not on file   Social Determinants of Health   Financial Resource Strain: Medium Risk (11/10/2022)   Overall Financial Resource Strain (CARDIA)    Difficulty of Paying Living Expenses: Somewhat hard  Food Insecurity: Food Insecurity Present (11/10/2022)   Hunger Vital Sign    Worried About Running Out of Food in the Last Year: Sometimes true    Ran Out of Food in the Last Year: Sometimes true  Transportation Needs: No Transportation Needs (07/05/2021)   Received from [facility], [facility]   PRAPARE - Transportation    In the past 12 months, has lack of transportation kept you from medical appointments or from getting medications?: No    Lack of Transportation (Non-Medical): No  Physical Activity: Not on file  Stress: Not on file  Social Connections: Not on file  Intimate Partner Violence: Not At Risk (11/10/2022)   Humiliation, Afraid, Rape, and Kick questionnaire    Fear of Current or Ex-Partner: No    Emotionally Abused: No    Physically Abused: No    Sexually Abused: No    FAMILY  HISTORY: Family History  Problem Relation Age of Onset   Skin cancer Mother    Prostate cancer Father     ALLERGIES:  has No Known Allergies.  MEDICATIONS:  Current Outpatient Medications  Medication Sig Dispense Refill   apixaban (ELIQUIS) 5 MG TABS tablet Take 1 tablet (5 mg total) by mouth 2 (two) times daily. 60 tablet 2   buPROPion (WELLBUTRIN XL) 300 MG 24 hr tablet Take 300 mg by mouth daily.     cholecalciferol (VITAMIN D3) 25 MCG (1000 UNIT) tablet Take 2,000 Units by mouth daily.     predniSONE (STERAPRED UNI-PAK 21 TAB) 10 MG (21) TBPK tablet Day 1 take 6 tablets, Day 2 take 5 tablets, Day 3 take 4 tablets, Day 4 take 3 tablets, Day 5 take 2 tablets D6 take 1 tablet 1 each 0   sertraline (ZOLOFT) 50 MG tablet Take 1 tablet by mouth daily.     lenalidomide (REVLIMID) 5 MG capsule Take 1 capsule (  5 mg total) by mouth daily. Take for 21 days, then hold for 7 days. Repeat every 28 days. 21 capsule 6   No current facility-administered medications for this visit.     PHYSICAL EXAMINATION: ECOG PERFORMANCE STATUS: 1 - Symptomatic but completely ambulatory Vitals:   01/27/23 1149 01/27/23 1153  BP: (!) 129/102 (!) 142/97  Pulse: 62   Resp: 20   Temp: 98.6 F (37 C)   SpO2: 100%    Filed Weights   01/27/23 1149  Weight: 247 lb 3.2 oz (112.1 kg)    Physical Exam Constitutional:      General: He is not in acute distress.    Appearance: He is not diaphoretic.  HENT:     Head: Normocephalic and atraumatic.     Nose: Nose normal.     Mouth/Throat:     Pharynx: No oropharyngeal exudate.  Eyes:     General: No scleral icterus.    Pupils: Pupils are equal, round, and reactive to light.  Cardiovascular:     Rate and Rhythm: Normal rate.  Pulmonary:     Effort: Pulmonary effort is normal. No respiratory distress.     Breath sounds: No wheezing.  Abdominal:     General: There is no distension.     Palpations: Abdomen is soft.     Tenderness: There is no abdominal  tenderness.  Musculoskeletal:        General: Normal range of motion.     Cervical back: Normal range of motion.  Skin:    General: Skin is warm and dry.     Findings: No erythema.  Neurological:     Mental Status: He is alert and oriented to person, place, and time. Mental status is at baseline.     Cranial Nerves: No cranial nerve deficit.     Motor: No abnormal muscle tone.     Coordination: Coordination normal.  Psychiatric:        Mood and Affect: Mood and affect normal.       LABORATORY DATA:  I have reviewed the data as listed    Latest Ref Rng & Units 01/27/2023   11:36 AM 12/27/2022   10:08 AM 11/17/2022   10:31 AM  CBC  WBC 4.0 - 10.5 K/uL 3.1  3.0  3.4   Hemoglobin 13.0 - 17.0 g/dL 57.8  46.9  62.9   Hematocrit 39.0 - 52.0 % 41.4  43.1  42.7   Platelets 150 - 400 K/uL 161  146  158       Latest Ref Rng & Units 01/27/2023   11:35 AM 12/27/2022   10:07 AM 11/10/2022    9:59 AM  CMP  Glucose 70 - 99 mg/dL 92  88  528   BUN 8 - 23 mg/dL 13  13  14    Creatinine 0.61 - 1.24 mg/dL 4.13  2.44  0.10   Sodium 135 - 145 mmol/L 137  134  137   Potassium 3.5 - 5.1 mmol/L 3.8  3.8  4.2   Chloride 98 - 111 mmol/L 107  105  107   CO2 22 - 32 mmol/L 24  24  23    Calcium 8.9 - 10.3 mg/dL 8.7  8.8  8.9   Total Protein 6.5 - 8.1 g/dL 6.8  6.9  6.6   Total Bilirubin 0.3 - 1.2 mg/dL 0.8  0.9  0.8   Alkaline Phos 38 - 126 U/L 60  54  49   AST 15 - 41 U/L  19  14  17    ALT 0 - 44 U/L 17  16  19      RADIOGRAPHIC STUDIES: I have personally reviewed the radiological images as listed and agreed with the findings in the report. No results found.

## 2023-01-30 LAB — KAPPA/LAMBDA LIGHT CHAINS
Kappa free light chain: 14.4 mg/L (ref 3.3–19.4)
Kappa, lambda light chain ratio: 1.22 (ref 0.26–1.65)
Lambda free light chains: 11.8 mg/L (ref 5.7–26.3)

## 2023-02-01 LAB — MULTIPLE MYELOMA PANEL, SERUM
Albumin SerPl Elph-Mcnc: 3.7 g/dL (ref 2.9–4.4)
Albumin/Glob SerPl: 1.6 (ref 0.7–1.7)
Alpha 1: 0.2 g/dL (ref 0.0–0.4)
Alpha2 Glob SerPl Elph-Mcnc: 0.7 g/dL (ref 0.4–1.0)
B-Globulin SerPl Elph-Mcnc: 1.1 g/dL (ref 0.7–1.3)
Gamma Glob SerPl Elph-Mcnc: 0.5 g/dL (ref 0.4–1.8)
Globulin, Total: 2.4 g/dL (ref 2.2–3.9)
IgA: 250 mg/dL (ref 61–437)
IgG (Immunoglobin G), Serum: 621 mg/dL (ref 603–1613)
IgM (Immunoglobulin M), Srm: 11 mg/dL — ABNORMAL LOW (ref 20–172)
Total Protein ELP: 6.1 g/dL (ref 6.0–8.5)

## 2023-02-15 ENCOUNTER — Encounter: Payer: Self-pay | Admitting: Oncology

## 2023-02-15 ENCOUNTER — Telehealth: Payer: Self-pay | Admitting: *Deleted

## 2023-02-15 NOTE — Telephone Encounter (Signed)
Form signed and will be scanned into his chart and attached to MyChart message per patient request by HIM

## 2023-02-15 NOTE — Telephone Encounter (Signed)
Received form, completed and sent for doctor signature

## 2023-02-17 ENCOUNTER — Other Ambulatory Visit: Payer: Self-pay

## 2023-02-17 DIAGNOSIS — C9000 Multiple myeloma not having achieved remission: Secondary | ICD-10-CM

## 2023-02-17 MED ORDER — LENALIDOMIDE 5 MG PO CAPS: 5.0000 mg | ORAL_CAPSULE | Freq: Every day | ORAL | 6 refills | Status: DC

## 2023-02-24 ENCOUNTER — Inpatient Hospital Stay: Payer: Medicare Other | Admitting: Oncology

## 2023-02-24 ENCOUNTER — Encounter: Payer: Self-pay | Admitting: Oncology

## 2023-02-24 ENCOUNTER — Inpatient Hospital Stay: Payer: Medicare Other

## 2023-02-24 ENCOUNTER — Inpatient Hospital Stay: Payer: Medicare Other | Attending: Oncology

## 2023-02-24 VITALS — BP 128/97 | HR 70 | Temp 96.8°F | Resp 18 | Wt 245.4 lb

## 2023-02-24 DIAGNOSIS — Z9481 Bone marrow transplant status: Secondary | ICD-10-CM | POA: Diagnosis not present

## 2023-02-24 DIAGNOSIS — Z79899 Other long term (current) drug therapy: Secondary | ICD-10-CM | POA: Insufficient documentation

## 2023-02-24 DIAGNOSIS — T451X5A Adverse effect of antineoplastic and immunosuppressive drugs, initial encounter: Secondary | ICD-10-CM

## 2023-02-24 DIAGNOSIS — D702 Other drug-induced agranulocytosis: Secondary | ICD-10-CM

## 2023-02-24 DIAGNOSIS — C9000 Multiple myeloma not having achieved remission: Secondary | ICD-10-CM

## 2023-02-24 DIAGNOSIS — Z5111 Encounter for antineoplastic chemotherapy: Secondary | ICD-10-CM

## 2023-02-24 DIAGNOSIS — I825Y1 Chronic embolism and thrombosis of unspecified deep veins of right proximal lower extremity: Secondary | ICD-10-CM | POA: Diagnosis not present

## 2023-02-24 DIAGNOSIS — G620 Drug-induced polyneuropathy: Secondary | ICD-10-CM

## 2023-02-24 LAB — CMP (CANCER CENTER ONLY)
ALT: 16 U/L (ref 0–44)
AST: 17 U/L (ref 15–41)
Albumin: 3.8 g/dL (ref 3.5–5.0)
Alkaline Phosphatase: 56 U/L (ref 38–126)
Anion gap: 8 (ref 5–15)
BUN: 12 mg/dL (ref 8–23)
CO2: 23 mmol/L (ref 22–32)
Calcium: 8.8 mg/dL — ABNORMAL LOW (ref 8.9–10.3)
Chloride: 106 mmol/L (ref 98–111)
Creatinine: 1.09 mg/dL (ref 0.61–1.24)
GFR, Estimated: >60 mL/min (ref >60)
Glucose, Bld: 72 mg/dL (ref 70–99)
Potassium: 3.8 mmol/L (ref 3.5–5.1)
Sodium: 137 mmol/L (ref 135–145)
Total Bilirubin: 0.9 mg/dL (ref <1.2)
Total Protein: 6.5 g/dL (ref 6.5–8.1)

## 2023-02-24 LAB — CBC WITH DIFFERENTIAL (CANCER CENTER ONLY)
Abs Immature Granulocytes: 0 10*3/uL (ref 0.00–0.07)
Basophils Absolute: 0 10*3/uL (ref 0.0–0.1)
Basophils Relative: 1 %
Eosinophils Absolute: 0.3 10*3/uL (ref 0.0–0.5)
Eosinophils Relative: 8 %
HCT: 42.9 % (ref 39.0–52.0)
Hemoglobin: 13.9 g/dL (ref 13.0–17.0)
Immature Granulocytes: 0 %
Lymphocytes Relative: 39 %
Lymphs Abs: 1.4 10*3/uL (ref 0.7–4.0)
MCH: 30.8 pg (ref 26.0–34.0)
MCHC: 32.4 g/dL (ref 30.0–36.0)
MCV: 95.1 fL (ref 80.0–100.0)
Monocytes Absolute: 0.4 10*3/uL (ref 0.1–1.0)
Monocytes Relative: 11 %
Neutro Abs: 1.5 10*3/uL — ABNORMAL LOW (ref 1.7–7.7)
Neutrophils Relative %: 41 %
Platelet Count: 131 10*3/uL — ABNORMAL LOW (ref 150–400)
RBC: 4.51 MIL/uL (ref 4.22–5.81)
RDW: 15.8 % — ABNORMAL HIGH (ref 11.5–15.5)
WBC Count: 3.6 10*3/uL — ABNORMAL LOW (ref 4.0–10.5)
nRBC: 0 % (ref 0.0–0.2)

## 2023-02-24 MED ORDER — APIXABAN 5 MG PO TABS: 5.0000 mg | ORAL_TABLET | Freq: Two times a day (BID) | ORAL | 2 refills | Status: DC

## 2023-02-24 MED ORDER — SODIUM CHLORIDE 0.9 % IV SOLN
INTRAVENOUS | Status: DC
  Filled 2023-02-24: qty 250

## 2023-02-24 MED ORDER — ZOLEDRONIC ACID 4 MG/100ML IV SOLN
4.0000 mg | Freq: Once | INTRAVENOUS | Status: AC
Start: 2023-02-24 — End: 2023-02-24
  Administered 2023-02-24: 4 mg via INTRAVENOUS
  Filled 2023-02-24: qty 100

## 2023-02-24 NOTE — Assessment & Plan Note (Signed)
Treatment as planned 

## 2023-02-24 NOTE — Assessment & Plan Note (Signed)
Continue anticoagulation with Eliquis 5mg  BID Patient declined repeat lower extremity ultrasound for follow-up of DVT

## 2023-02-24 NOTE — Assessment & Plan Note (Signed)
Stable ANC. monitor

## 2023-02-24 NOTE — Progress Notes (Signed)
Hematology/Oncology Progress note [phone] Fax:([phone]        CHIEF COMPLAINTS/REASON FOR VISIT:  Follow-up for multiple myeloma  ASSESSMENT & PLAN:   Cancer Staging  Multiple myeloma not having achieved remission (HCC) Staging form: Plasma Cell Myeloma and Plasma Cell Disorders, AJCC 8th Edition - Clinical stage from 02/04/2020: Beta-2-microglobulin (mg/L): 2.1, Albumin (g/dL): 2.7, ISS: Stage II, High-risk cytogenetics: Absent, LDH: Unknown - Signed by Rickard Patience, MD on 05/06/2020   Multiple myeloma not having achieved remission (HCC) #IgG multiple myeloma, stage II, del 1 p, high risk.  Declined bone marrow transplant evaluation. 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 Labs are reviewed and discussed with patient. 12/31/2022 He will start next cycle of Revlimid  5mg  3 weeks on 1 week off [ dose reduced due to neutropenia] Off Acyclovir since he has been 1 year after transplant.   Zometa Q15months -proceed today.  Next due Feb 2025   Chronic deep vein thrombosis (DVT) (HCC) Continue anticoagulation with Eliquis 5mg  BID Patient declined repeat lower extremity ultrasound for follow-up of DVT   Encounter for antineoplastic chemotherapy Treatment as planned.  History of bone marrow transplant [facility]) Patient follows up with oncology for post marrow transplant vaccination. Off acyclovir 400 mg twice daily   Chemotherapy-induced neuropathy (HCC) Grade 1, stable symptoms.  Observation  Drug-induced neutropenia (HCC) Stable ANC. monitor     Orders Placed This Encounter  Procedures   CBC with Differential (Cancer Center Only)    Standing Status:   Future    Standing  Expiration Date:   02/24/2024   CMP (Cancer Center only)    Standing Status:   Future    Standing Expiration Date:   02/24/2024   Kappa/lambda light chains    Standing Status:   Future    Standing Expiration Date:   02/24/2024   Multiple Myeloma Panel (SPEP&IFE w/QIG)    Standing Status:   Future    Standing Expiration Date:   02/24/2024    Lab MD 4 weeks   All questions were answered. The patient knows to call the clinic with any problems, questions or concerns.  Rickard Patience, MD, PhD [facility] Hematology Oncology 02/24/2023     HISTORY OF PRESENTING ILLNESS:   Tommy Smith. is a  67 y.o.  male presents for follow-up of management of multiple myeloma Oncology history summary listed as below  Oncology History  Multiple myeloma not having achieved remission (HCC)  01/29/2020 Imaging   CT showed multiple bilateral pulmonary nodules measuring up to 10 mm in the right middle lobe, nodules are indeterminate, infectious/inflammatory etiology versus metastatic disease. Multiple osseous lytic lesions, largest lesion involving the left pubic bone concerning for metastatic disease.Compression fractures at T9, age indeterminate. Appearance focal area of thickening at the gastroesophageal junction.  Further evaluation with upper GI study or direct visualization with ndoscopy is recommended No bowel obstruction.Aortic atherosclerosis.   02/04/2020 Cancer Staging   Staging form: Plasma Cell Myeloma and Plasma Cell Disorders, AJCC 8th Edition - Clinical stage from 02/04/2020: Beta-2-microglobulin (mg/L): 2.1, Albumin (g/dL): 2.7, ISS: Stage II, High-risk cytogenetics: Absent, LDH: Unknown - Signed by Rickard Patience, MD on 05/06/2020   02/21/2020 Initial Diagnosis   Multiple myeloma  Baseline M protein 4.1, kappa  light chain level 333.8 Beta-2 microglobulin 2.1, albumin 2.7.  -02/12/2020, bone marrow biopsy showed hypercellular for age, 80% plasma cell which are complex restricted by light chain  in situ heparinization.  Background hematopoiesis is present but reduced. Cytogenetics is normal, Myeloma FISH panel-deletion 1P,Standard risk.   02/27/2020 - 05/06/2021 Chemotherapy   VRd     09/01/2020 Bone Marrow Biopsy   Bone marrow results were reviewed.  1% plasma cell.  Normal cytogenetics.  Myeloma FISH negative for 1p del.   04/21/2021 Bone Marrow Biopsy   Bone marrow after 14 cycles of VRD -[January 2023 ]showed plasma cells 2%.-   06/25/2021 Bone Marrow Transplant   Patient underwent autologous bone marrow transplant at [facility]   09/2021 - 11/10/2022 Chemotherapy   Revlimid 10 mg 3 weeks on 1 week off   10/26/2021 Imaging   Bilateral lower extremity venous US Partially occlusive right deep vein thrombus extending from the calf veins into the common femoral vein with limited examination of the left extremity demonstrating partially occlusive thrombus in the common femoral vein.     01/13/2022 Imaging   Bilateral lower extremity venous US 1. 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 2. Extensive predominantly occlusive/near occlusive left lower extremity DVT extending from the left common femoral vein through the imaged tibial veins, age-indeterminate in the absence of prior examinations though echogenic thrombus within the left femoral vein suggests a chronic etiology.     01/20/2022 Bone Marrow Biopsy   -Bone marrow biopsy at [facility] showed Cellular bone marrow aspirate with trilineage hematopoiesis and no morphologic evidence of plasma cell neoplasm.  -Negative minimal residual disease.   12/03/2022 -  Chemotherapy   Revlimid 5mg  3 weeks on, 1 week off.     #Focal area of thickening at the GE junction. 10/23/2020 EGD is  normal.   #Lung nodules, measures up to 10 mm on the right middle lobe.  Indeterminate.  Attention on follow-up.  Recommend repeat CT chest and he declined  10/28/2011, unilateral right lower extremity ultrasound showed partially occlusive right deep vein thrombosis extending from the calf vein into the, femoral vein. Patient was recommended to start on Eliquis for anticoagulation.  He tolerates well.  Right thigh pain has improved.  INTERVAL HISTORY Kenshin Carlberg. is a 67 y.o. male who has above history reviewed by me today presents for follow up visit for multiple myeloma Patient is currently on Revlimid 5 mg 3 weeks on 1 week off for maintenance.Tolerates well.  Current everyday smoker, 10 cigarettes daily.   Patient denies fever chills.  No new complaints.  Review of Systems  Constitutional:  Negative for appetite change, chills, fatigue, fever and unexpected weight change.  HENT:   Negative for hearing loss and voice change.   Eyes:  Negative for eye problems and icterus.  Respiratory:  Negative for chest tightness, cough and shortness of breath.   Cardiovascular:  Negative for chest pain and leg swelling.  Gastrointestinal:  Negative for abdominal distention, abdominal pain and constipation.  Endocrine: Negative for hot flashes.  Genitourinary:  Negative for difficulty urinating, dysuria and frequency.   Musculoskeletal:  Negative for arthralgias, back pain and neck pain.  Skin:  Negative for itching and rash.  Neurological:  Positive for numbness. Negative for light-headedness.  Hematological:  Negative for adenopathy.  Does not bruise/bleed easily.  Psychiatric/Behavioral:  Negative for confusion.      MEDICAL HISTORY:  Past Medical History:  Diagnosis Date   Cancer (HCC)    Depression    Hypercalcemia 03/02/2020   Hyperlipemia    Hypertension    Hypogonadism in male    Multiple myeloma [facility])     SURGICAL HISTORY: Past Surgical History:  Procedure Laterality Date    BONE MARROW BIOPSY     COLONOSCOPY  07/14/2008   ESOPHAGOGASTRODUODENOSCOPY N/A 10/22/2020   Procedure: ESOPHAGOGASTRODUODENOSCOPY (EGD);  Surgeon: Wyline Mood, MD;  Location: [facility] ENDOSCOPY;  Service: Gastroenterology;  Laterality: N/A;    SOCIAL HISTORY: Social History   Socioeconomic History   Marital status: Married    Spouse name: Cyndia Bent   Number of children: 3   Years of education: Not on file   Highest education level: Not on file  Occupational History   Not on file  Tobacco Use   Smoking status: Every Day    Current packs/day: 0.50    Average packs/day: 0.5 packs/day for 30.0 years (15.0 ttl pk-yrs)    Types: Cigarettes   Smokeless tobacco: Never  Substance and Sexual Activity   Alcohol use: Yes    Comment: occcassional   Drug use: No   Sexual activity: Not on file  Other Topics Concern   Not on file  Social History Narrative   Not on file   Social Determinants of Health   Financial Resource Strain: Medium Risk (11/10/2022)   Overall Financial Resource Strain (CARDIA)    Difficulty of Paying Living Expenses: Somewhat hard  Food Insecurity: Food Insecurity Present (11/10/2022)   Hunger Vital Sign    Worried About Running Out of Food in the Last Year: Sometimes true    Ran Out of Food in the Last Year: Sometimes true  Transportation Needs: No Transportation Needs (07/05/2021)   Received from [facility], [facility]   PRAPARE - Transportation    In the past 12 months, has lack of transportation kept you from medical appointments or from getting medications?: No    Lack of Transportation (Non-Medical): No  Physical Activity: Not on file  Stress: Not on file  Social Connections: Not on file  Intimate Partner Violence: Not At Risk (11/10/2022)   Humiliation, Afraid, Rape, and Kick questionnaire    Fear of Current or Ex-Partner: No    Emotionally Abused: No    Physically Abused: No    Sexually Abused: No    FAMILY  HISTORY: Family History  Problem Relation Age of Onset   Skin cancer Mother    Prostate cancer Father     ALLERGIES:  has No Known Allergies.  MEDICATIONS:  Current Outpatient Medications  Medication Sig Dispense Refill   buPROPion (WELLBUTRIN XL) 300 MG 24 hr tablet Take 300 mg by mouth daily.     cholecalciferol (VITAMIN D3) 25 MCG (1000 UNIT) tablet Take 2,000 Units by mouth daily.     lenalidomide (REVLIMID) 5 MG capsule Take 1 capsule (5 mg total) by mouth daily. Take for 21 days, then hold for 7 days. Repeat every 28 days. 21 capsule 6   sertraline (ZOLOFT) 50 MG tablet Take 1 tablet by mouth daily.     apixaban (ELIQUIS) 5 MG TABS tablet Take 1 tablet (5 mg total) by mouth 2 (two) times daily. 60 tablet 2   predniSONE (STERAPRED UNI-PAK 21 TAB) 10 MG (21) TBPK tablet Day 1 take 6 tablets, Day 2 take 5  tablets, Day 3 take 4 tablets, Day 4 take 3 tablets, Day 5 take 2 tablets D6 take 1 tablet (Patient not taking: Reported on 02/24/2023) 1 each 0   No current facility-administered medications for this visit.     PHYSICAL EXAMINATION: ECOG PERFORMANCE STATUS: 1 - Symptomatic but completely ambulatory Vitals:   02/24/23 0947 02/24/23 0956  BP: (!) 145/99 (!) 128/97  Pulse: 70   Resp: 18   Temp: (!) 96.8 F (36 C)   SpO2: 99%    Filed Weights   02/24/23 0947  Weight: 245 lb 6.4 oz (111.3 kg)    Physical Exam Constitutional:      General: He is not in acute distress.    Appearance: He is not diaphoretic.  HENT:     Head: Normocephalic and atraumatic.     Nose: Nose normal.     Mouth/Throat:     Pharynx: No oropharyngeal exudate.  Eyes:     General: No scleral icterus.    Pupils: Pupils are equal, round, and reactive to light.  Cardiovascular:     Rate and Rhythm: Normal rate.  Pulmonary:     Effort: Pulmonary effort is normal. No respiratory distress.     Breath sounds: No wheezing.  Abdominal:     General: There is no distension.     Palpations: Abdomen is  soft.     Tenderness: There is no abdominal tenderness.  Musculoskeletal:        General: Normal range of motion.     Cervical back: Normal range of motion.  Skin:    General: Skin is warm and dry.     Findings: No erythema.  Neurological:     Mental Status: He is alert and oriented to person, place, and time. Mental status is at baseline.     Cranial Nerves: No cranial nerve deficit.     Motor: No abnormal muscle tone.     Coordination: Coordination normal.  Psychiatric:        Mood and Affect: Mood and affect normal.       LABORATORY DATA:  I have reviewed the data as listed    Latest Ref Rng & Units 02/24/2023    9:34 AM 01/27/2023   11:36 AM 12/27/2022   10:08 AM  CBC  WBC 4.0 - 10.5 K/uL 3.6  3.1  3.0   Hemoglobin 13.0 - 17.0 g/dL 24.4  01.0  27.2   Hematocrit 39.0 - 52.0 % 42.9  41.4  43.1   Platelets 150 - 400 K/uL 131  161  146       Latest Ref Rng & Units 02/24/2023    9:34 AM 01/27/2023   11:35 AM 12/27/2022   10:07 AM  CMP  Glucose 70 - 99 mg/dL 72  92  88   BUN 8 - 23 mg/dL 12  13  13    Creatinine 0.61 - 1.24 mg/dL 5.36  6.44  0.34   Sodium 135 - 145 mmol/L 137  137  134   Potassium 3.5 - 5.1 mmol/L 3.8  3.8  3.8   Chloride 98 - 111 mmol/L 106  107  105   CO2 22 - 32 mmol/L 23  24  24    Calcium 8.9 - 10.3 mg/dL 8.8  8.7  8.8   Total Protein 6.5 - 8.1 g/dL 6.5  6.8  6.9   Total Bilirubin <1.2 mg/dL 0.9  0.8  0.9   Alkaline Phos 38 - 126 U/L 56  60  54  AST 15 - 41 U/L 17  19  14    ALT 0 - 44 U/L 16  17  16      RADIOGRAPHIC STUDIES: I have personally reviewed the radiological images as listed and agreed with the findings in the report. No results found.

## 2023-02-24 NOTE — Assessment & Plan Note (Signed)
Patient follows up with oncology for post marrow transplant vaccination. Off acyclovir 400 mg twice daily

## 2023-02-24 NOTE — Assessment & Plan Note (Addendum)
#  IgG multiple myeloma, stage II, del 1 p, high risk.  Declined bone marrow transplant evaluation. 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 Labs are reviewed and discussed with patient. 12/31/2022 He will start next cycle of Revlimid  5mg  3 weeks on 1 week off [ dose reduced due to neutropenia] Off Acyclovir since he has been 1 year after transplant.   Zometa Q28months -proceed today.  Next due Feb 2025

## 2023-02-24 NOTE — Assessment & Plan Note (Signed)
Grade 1, stable symptoms.  Observation 

## 2023-02-25 LAB — KAPPA/LAMBDA LIGHT CHAINS
Kappa free light chain: 14.2 mg/L (ref 3.3–19.4)
Kappa, lambda light chain ratio: 1.23 (ref 0.26–1.65)
Lambda free light chains: 11.5 mg/L (ref 5.7–26.3)

## 2023-03-02 LAB — MULTIPLE MYELOMA PANEL, SERUM
Albumin SerPl Elph-Mcnc: 3.6 g/dL (ref 2.9–4.4)
Albumin/Glob SerPl: 1.5 (ref 0.7–1.7)
Alpha 1: 0.2 g/dL (ref 0.0–0.4)
Alpha2 Glob SerPl Elph-Mcnc: 0.7 g/dL (ref 0.4–1.0)
B-Globulin SerPl Elph-Mcnc: 1 g/dL (ref 0.7–1.3)
Gamma Glob SerPl Elph-Mcnc: 0.6 g/dL (ref 0.4–1.8)
Globulin, Total: 2.5 g/dL (ref 2.2–3.9)
IgA: 274 mg/dL (ref 61–437)
IgG (Immunoglobin G), Serum: 642 mg/dL (ref 603–1613)
IgM (Immunoglobulin M), Srm: 10 mg/dL — ABNORMAL LOW (ref 20–172)
Total Protein ELP: 6.1 g/dL (ref 6.0–8.5)

## 2023-03-23 ENCOUNTER — Encounter: Payer: Self-pay | Admitting: Oncology

## 2023-03-23 ENCOUNTER — Inpatient Hospital Stay: Payer: Medicare Other | Admitting: Oncology

## 2023-03-23 ENCOUNTER — Inpatient Hospital Stay: Payer: Medicare Other | Attending: Oncology

## 2023-03-23 VITALS — BP 131/83 | HR 67 | Temp 97.4°F | Resp 18 | Wt 243.5 lb

## 2023-03-23 DIAGNOSIS — Z808 Family history of malignant neoplasm of other organs or systems: Secondary | ICD-10-CM | POA: Diagnosis not present

## 2023-03-23 DIAGNOSIS — D702 Other drug-induced agranulocytosis: Secondary | ICD-10-CM

## 2023-03-23 DIAGNOSIS — F17210 Nicotine dependence, cigarettes, uncomplicated: Secondary | ICD-10-CM | POA: Insufficient documentation

## 2023-03-23 DIAGNOSIS — G620 Drug-induced polyneuropathy: Secondary | ICD-10-CM | POA: Diagnosis not present

## 2023-03-23 DIAGNOSIS — Z5111 Encounter for antineoplastic chemotherapy: Secondary | ICD-10-CM

## 2023-03-23 DIAGNOSIS — Z9481 Bone marrow transplant status: Secondary | ICD-10-CM

## 2023-03-23 DIAGNOSIS — C9000 Multiple myeloma not having achieved remission: Secondary | ICD-10-CM

## 2023-03-23 DIAGNOSIS — T451X5A Adverse effect of antineoplastic and immunosuppressive drugs, initial encounter: Secondary | ICD-10-CM

## 2023-03-23 DIAGNOSIS — Z8042 Family history of malignant neoplasm of prostate: Secondary | ICD-10-CM | POA: Diagnosis not present

## 2023-03-23 DIAGNOSIS — Z86718 Personal history of other venous thrombosis and embolism: Secondary | ICD-10-CM | POA: Insufficient documentation

## 2023-03-23 LAB — CBC WITH DIFFERENTIAL (CANCER CENTER ONLY)
Abs Immature Granulocytes: 0.01 10*3/uL (ref 0.00–0.07)
Basophils Absolute: 0 10*3/uL (ref 0.0–0.1)
Basophils Relative: 1 %
Eosinophils Absolute: 0.3 10*3/uL (ref 0.0–0.5)
Eosinophils Relative: 10 %
HCT: 43.2 % (ref 39.0–52.0)
Hemoglobin: 14 g/dL (ref 13.0–17.0)
Immature Granulocytes: 0 %
Lymphocytes Relative: 44 %
Lymphs Abs: 1.4 10*3/uL (ref 0.7–4.0)
MCH: 30 pg (ref 26.0–34.0)
MCHC: 32.4 g/dL (ref 30.0–36.0)
MCV: 92.5 fL (ref 80.0–100.0)
Monocytes Absolute: 0.3 10*3/uL (ref 0.1–1.0)
Monocytes Relative: 9 %
Neutro Abs: 1.1 10*3/uL — ABNORMAL LOW (ref 1.7–7.7)
Neutrophils Relative %: 36 %
Platelet Count: 148 10*3/uL — ABNORMAL LOW (ref 150–400)
RBC: 4.67 MIL/uL (ref 4.22–5.81)
RDW: 15.9 % — ABNORMAL HIGH (ref 11.5–15.5)
WBC Count: 3.2 10*3/uL — ABNORMAL LOW (ref 4.0–10.5)
nRBC: 0 % (ref 0.0–0.2)

## 2023-03-23 LAB — CMP (CANCER CENTER ONLY)
ALT: 19 U/L (ref 0–44)
AST: 16 U/L (ref 15–41)
Albumin: 4 g/dL (ref 3.5–5.0)
Alkaline Phosphatase: 55 U/L (ref 38–126)
Anion gap: 6 (ref 5–15)
BUN: 14 mg/dL (ref 8–23)
CO2: 26 mmol/L (ref 22–32)
Calcium: 8.5 mg/dL — ABNORMAL LOW (ref 8.9–10.3)
Chloride: 107 mmol/L (ref 98–111)
Creatinine: 1.06 mg/dL (ref 0.61–1.24)
GFR, Estimated: >60 mL/min (ref >60)
Glucose, Bld: 95 mg/dL (ref 70–99)
Potassium: 3.6 mmol/L (ref 3.5–5.1)
Sodium: 139 mmol/L (ref 135–145)
Total Bilirubin: 0.9 mg/dL (ref <1.2)
Total Protein: 6.8 g/dL (ref 6.5–8.1)

## 2023-03-23 MED ORDER — LENALIDOMIDE 5 MG PO CAPS: 5.0000 mg | ORAL_CAPSULE | Freq: Every day | ORAL | 6 refills | Status: DC

## 2023-03-23 NOTE — Assessment & Plan Note (Signed)
Recommend patient to take calcium 600mg  daily and Vitamin D 1000 units.

## 2023-03-23 NOTE — Assessment & Plan Note (Signed)
#  IgG multiple myeloma, stage II, del 1 p, high risk.  Declined bone marrow transplant evaluation. 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 Labs are reviewed and discussed with patient. He will start next cycle of Revlimid  5mg  3 weeks on 1 week off [ dose reduced due to neutropenia] Off Acyclovir since he has been 1 year after transplant.   Zometa Q59months -proceed today.  Next due Feb 2025

## 2023-03-23 NOTE — Assessment & Plan Note (Signed)
Treatment as planned 

## 2023-03-23 NOTE — Assessment & Plan Note (Signed)
Grade 1, stable symptoms.  Observation 

## 2023-03-23 NOTE — Assessment & Plan Note (Signed)
Patient follows up with oncology for post marrow transplant vaccination. Off acyclovir 400 mg twice daily

## 2023-03-23 NOTE — Progress Notes (Signed)
Hematology/Oncology Progress note [phone] [phone]        CHIEF COMPLAINTS/REASON FOR VISIT:  Follow-up for multiple myeloma  ASSESSMENT & PLAN:   Cancer Staging  Multiple myeloma not having achieved remission (HCC) Staging form: Plasma Cell Myeloma and Plasma Cell Disorders, AJCC 8th Edition - Clinical stage from 02/04/2020: Beta-2-microglobulin (mg/L): 2.1, Albumin (g/dL): 2.7, ISS: Stage II, High-risk cytogenetics: Absent, LDH: Unknown - Signed by Rickard Patience, MD on 05/06/2020   Multiple myeloma not having achieved remission (HCC) #IgG multiple myeloma, stage II, del 1 p, high risk.  Declined bone marrow transplant evaluation. 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 Labs are reviewed and discussed with patient. Tommy Smith will start next cycle of Revlimid  5mg  3 weeks on 1 week off [ dose reduced due to neutropenia] Off Acyclovir since Tommy Smith has been 1 year after transplant.   Zometa Q3months -proceed today.  Next due Feb 2025   Chemotherapy-induced neuropathy (HCC) Grade 1, stable symptoms.  Observation  Drug-induced neutropenia (HCC) Stable ANC. monitor   Encounter for antineoplastic chemotherapy Treatment as planned.  History of bone marrow transplant [facility]) Patient follows up with oncology for post marrow transplant vaccination. Off acyclovir 400 mg twice daily   Hypocalcemia Recommend patient to take calcium 600mg  daily and Vitamin D 1000 units.      Orders Placed This Encounter  Procedures   CMP (Cancer Center only)    Standing Status:   Future    Expected Date:   04/20/2023    Expiration Date:   03/22/2024   CBC with Differential (Cancer Center Only)     Standing Status:   Future    Expected Date:   04/20/2023    Expiration Date:   03/22/2024   Kappa/lambda light chains    Standing Status:   Future    Expected Date:   04/20/2023    Expiration Date:   03/22/2024   Multiple Myeloma Panel (SPEP&IFE w/QIG)    Standing Status:   Future    Expected Date:   04/20/2023    Expiration Date:   03/22/2024    Lab MD 4 weeks   All questions were answered. The patient knows to call the clinic with any problems, questions or concerns.  Rickard Patience, MD, PhD [facility] Hematology Oncology 03/23/2023     HISTORY OF PRESENTING ILLNESS:   Tommy Beyerle. is a  67 y.o.  male presents for follow-up of management of multiple myeloma Oncology history summary listed as below  Oncology History  Multiple myeloma not having achieved remission (HCC)  01/29/2020 Imaging   CT showed multiple bilateral pulmonary nodules measuring up to 10 mm in the right middle lobe, nodules are indeterminate, infectious/inflammatory etiology versus metastatic disease. Multiple osseous lytic lesions, largest lesion involving the left pubic bone concerning for metastatic disease.Compression fractures at T9, age indeterminate. Appearance focal area of thickening at the gastroesophageal junction.  Further evaluation with upper GI study or direct visualization with ndoscopy is recommended No bowel obstruction.Aortic atherosclerosis.   02/04/2020 Cancer Staging   Staging form: Plasma Cell Myeloma and Plasma Cell Disorders, AJCC 8th Edition - Clinical stage from 02/04/2020: Beta-2-microglobulin (mg/L): 2.1, Albumin (g/dL): 2.7, ISS: Stage II, High-risk cytogenetics: Absent, LDH: Unknown - Signed by Cathie Hoops,  Janyth Contes, MD on 05/06/2020   02/21/2020 Initial Diagnosis   Multiple myeloma  Baseline M protein 4.1, kappa light chain level 333.8 Beta-2 microglobulin 2.1, albumin 2.7.  -02/12/2020, bone marrow biopsy showed hypercellular for age, 80% plasma cell which are complex restricted by light  chain in situ heparinization.  Background hematopoiesis is present but reduced. Cytogenetics is normal, Myeloma FISH panel-deletion 1P,Standard risk.   02/27/2020 - 05/06/2021 Chemotherapy   VRd     09/01/2020 Bone Marrow Biopsy   Bone marrow results were reviewed.  1% plasma cell.  Normal cytogenetics.  Myeloma FISH negative for 1p del.   04/21/2021 Bone Marrow Biopsy   Bone marrow after 14 cycles of VRD -[January 2023 ]showed plasma cells 2%.-   06/25/2021 Bone Marrow Transplant   Patient underwent autologous bone marrow transplant at [facility]   09/2021 - 11/10/2022 Chemotherapy   Revlimid 10 mg 3 weeks on 1 week off   10/26/2021 Imaging   Bilateral lower extremity venous US Partially occlusive right deep vein thrombus extending from the calf veins into the common femoral vein with limited examination of the left extremity demonstrating partially occlusive thrombus in the common femoral vein.     01/13/2022 Imaging   Bilateral lower extremity venous US 1. 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 2. Extensive predominantly occlusive/near occlusive left lower extremity DVT extending from the left common femoral vein through the imaged tibial veins, age-indeterminate in the absence of prior examinations though echogenic thrombus within the left femoral vein suggests a chronic etiology.     01/20/2022 Bone Marrow Biopsy   -Bone marrow biopsy at [facility] showed Cellular bone marrow aspirate with trilineage hematopoiesis and no morphologic evidence of plasma cell neoplasm.  -Negative minimal residual disease.   12/03/2022 -  Chemotherapy   Revlimid 5mg  3 weeks on, 1 week off.     #Focal area of thickening at the GE junction. 10/23/2020 EGD  is normal.   #Lung nodules, measures up to 10 mm on the right middle lobe.  Indeterminate.  Attention on follow-up.  Recommend repeat CT chest and Tommy Smith declined  10/28/2011, unilateral right lower extremity ultrasound showed partially occlusive right deep vein thrombosis extending from the calf vein into the, femoral vein. Patient was recommended to start on Eliquis for anticoagulation.  Tommy Smith tolerates well.  Right thigh pain has improved.  INTERVAL HISTORY Tommy Modzelewski. is a 67 y.o. male who has above history reviewed by me today presents for follow up visit for multiple myeloma Patient is currently on Revlimid 5 mg 3 weeks on 1 week off for maintenance.Tolerates well.  Current everyday smoker, 10 cigarettes daily.   Patient denies fever chills.  No new complaints.  Review of Systems  Constitutional:  Negative for appetite change, chills, fatigue, fever and unexpected weight change.  HENT:   Negative for hearing loss and voice change.   Eyes:  Negative for eye problems and icterus.  Respiratory:  Negative for chest tightness, cough and shortness of breath.   Cardiovascular:  Negative for chest pain and leg swelling.  Gastrointestinal:  Negative for abdominal distention, abdominal pain and constipation.  Endocrine: Negative for hot flashes.  Genitourinary:  Negative for difficulty urinating, dysuria and frequency.   Musculoskeletal:  Negative for arthralgias, back pain and neck pain.  Skin:  Negative  for itching and rash.  Neurological:  Positive for numbness. Negative for light-headedness.  Hematological:  Negative for adenopathy. Does not bruise/bleed easily.  Psychiatric/Behavioral:  Negative for confusion.      MEDICAL HISTORY:  Past Medical History:  Diagnosis Date   Cancer (HCC)    Depression    Hypercalcemia 03/02/2020   Hyperlipemia    Hypertension    Hypogonadism in male    Multiple myeloma [facility])     SURGICAL HISTORY: Past Surgical History:  Procedure Laterality  Date   BONE MARROW BIOPSY     COLONOSCOPY  07/14/2008   ESOPHAGOGASTRODUODENOSCOPY N/A 10/22/2020   Procedure: ESOPHAGOGASTRODUODENOSCOPY (EGD);  Surgeon: Wyline Mood, MD;  Location: [facility] ENDOSCOPY;  Service: Gastroenterology;  Laterality: N/A;    SOCIAL HISTORY: Social History   Socioeconomic History   Marital status: Married    Spouse name: Cyndia Bent   Number of children: 3   Years of education: Not on file   Highest education level: Not on file  Occupational History   Not on file  Tobacco Use   Smoking status: Every Day    Current packs/day: 0.50    Average packs/day: 0.5 packs/day for 30.0 years (15.0 ttl pk-yrs)    Types: Cigarettes   Smokeless tobacco: Never  Substance and Sexual Activity   Alcohol use: Yes    Comment: occcassional   Drug use: No   Sexual activity: Not on file  Other Topics Concern   Not on file  Social History Narrative   Not on file   Social Drivers of Health   Financial Resource Strain: Medium Risk (11/10/2022)   Overall Financial Resource Strain (CARDIA)    Difficulty of Paying Living Expenses: Somewhat hard  Food Insecurity: Food Insecurity Present (11/10/2022)   Hunger Vital Sign    Worried About Running Out of Food in the Last Year: Sometimes true    Ran Out of Food in the Last Year: Sometimes true  Transportation Needs: No Transportation Needs (07/05/2021)   Received from [facility], [facility]   PRAPARE - Transportation    In the past 12 months, has lack of transportation kept you from medical appointments or from getting medications?: No    Lack of Transportation (Non-Medical): No  Physical Activity: Not on file  Stress: Not on file  Social Connections: Not on file  Intimate Partner Violence: Not At Risk (11/10/2022)   Humiliation, Afraid, Rape, and Kick questionnaire    Fear of Current or Ex-Partner: No    Emotionally Abused: No    Physically Abused: No    Sexually Abused: No    FAMILY  HISTORY: Family History  Problem Relation Age of Onset   Skin cancer Mother    Prostate cancer Father     ALLERGIES:  has no known allergies.  MEDICATIONS:  Current Outpatient Medications  Medication Sig Dispense Refill   apixaban (ELIQUIS) 5 MG TABS tablet Take 1 tablet (5 mg total) by mouth 2 (two) times daily. 60 tablet 2   buPROPion (WELLBUTRIN XL) 300 MG 24 hr tablet Take 300 mg by mouth daily.     cholecalciferol (VITAMIN D3) 25 MCG (1000 UNIT) tablet Take 2,000 Units by mouth daily.     sertraline (ZOLOFT) 50 MG tablet Take 1 tablet by mouth daily.     lenalidomide (REVLIMID) 5 MG capsule Take 1 capsule (5 mg total) by mouth daily. Take for 21 days, then hold for 7 days. Repeat every 28 days. 21 capsule 6  predniSONE (STERAPRED UNI-PAK 21 TAB) 10 MG (21) TBPK tablet Day 1 take 6 tablets, Day 2 take 5 tablets, Day 3 take 4 tablets, Day 4 take 3 tablets, Day 5 take 2 tablets D6 take 1 tablet (Patient not taking: Reported on 03/23/2023) 1 each 0   No current facility-administered medications for this visit.     PHYSICAL EXAMINATION: ECOG PERFORMANCE STATUS: 1 - Symptomatic but completely ambulatory Vitals:   03/23/23 1335  BP: 131/83  Pulse: 67  Resp: 18  Temp: (!) 97.4 F (36.3 C)  SpO2: 100%   Filed Weights   03/23/23 1335  Weight: 243 lb 8 oz (110.5 kg)    Physical Exam Constitutional:      General: Tommy Smith is not in acute distress.    Appearance: Tommy Smith is not diaphoretic.  HENT:     Head: Normocephalic and atraumatic.     Nose: Nose normal.     Mouth/Throat:     Pharynx: No oropharyngeal exudate.  Eyes:     General: No scleral icterus.    Pupils: Pupils are equal, round, and reactive to light.  Cardiovascular:     Rate and Rhythm: Normal rate.  Pulmonary:     Effort: Pulmonary effort is normal. No respiratory distress.     Breath sounds: No wheezing.  Abdominal:     General: There is no distension.     Palpations: Abdomen is soft.     Tenderness: There is  no abdominal tenderness.  Musculoskeletal:        General: Normal range of motion.     Cervical back: Normal range of motion.  Skin:    General: Skin is warm and dry.     Findings: No erythema.  Neurological:     Mental Status: Tommy Smith is alert and oriented to person, place, and time. Mental status is at baseline.     Cranial Nerves: No cranial nerve deficit.     Motor: No abnormal muscle tone.     Coordination: Coordination normal.  Psychiatric:        Mood and Affect: Mood and affect normal.       LABORATORY DATA:  I have reviewed the data as listed    Latest Ref Rng & Units 03/23/2023    1:18 PM 02/24/2023    9:34 AM 01/27/2023   11:36 AM  CBC  WBC 4.0 - 10.5 K/uL 3.2  3.6  3.1   Hemoglobin 13.0 - 17.0 g/dL 40.9  81.1  91.4   Hematocrit 39.0 - 52.0 % 43.2  42.9  41.4   Platelets 150 - 400 K/uL 148  131  161       Latest Ref Rng & Units 03/23/2023    1:18 PM 02/24/2023    9:34 AM 01/27/2023   11:35 AM  CMP  Glucose 70 - 99 mg/dL 95  72  92   BUN 8 - 23 mg/dL 14  12  13    Creatinine 0.61 - 1.24 mg/dL 7.82  9.56  2.13   Sodium 135 - 145 mmol/L 139  137  137   Potassium 3.5 - 5.1 mmol/L 3.6  3.8  3.8   Chloride 98 - 111 mmol/L 107  106  107   CO2 22 - 32 mmol/L 26  23  24    Calcium 8.9 - 10.3 mg/dL 8.5  8.8  8.7   Total Protein 6.5 - 8.1 g/dL 6.8  6.5  6.8   Total Bilirubin <1.2 mg/dL 0.9  0.9  0.8  Alkaline Phos 38 - 126 U/L 55  56  60   AST 15 - 41 U/L 16  17  19    ALT 0 - 44 U/L 19  16  17      RADIOGRAPHIC STUDIES: I have personally reviewed the radiological images as listed and agreed with the findings in the report. No results found.

## 2023-03-23 NOTE — Assessment & Plan Note (Signed)
Stable ANC. monitor

## 2023-03-25 LAB — KAPPA/LAMBDA LIGHT CHAINS
Kappa free light chain: 19.2 mg/L (ref 3.3–19.4)
Kappa, lambda light chain ratio: 1.33 (ref 0.26–1.65)
Lambda free light chains: 14.4 mg/L (ref 5.7–26.3)

## 2023-03-31 LAB — MULTIPLE MYELOMA PANEL, SERUM
Albumin SerPl Elph-Mcnc: 3.6 g/dL (ref 2.9–4.4)
Albumin/Glob SerPl: 1.4 (ref 0.7–1.7)
Alpha 1: 0.2 g/dL (ref 0.0–0.4)
Alpha2 Glob SerPl Elph-Mcnc: 0.7 g/dL (ref 0.4–1.0)
B-Globulin SerPl Elph-Mcnc: 1 g/dL (ref 0.7–1.3)
Gamma Glob SerPl Elph-Mcnc: 0.6 g/dL (ref 0.4–1.8)
Globulin, Total: 2.6 g/dL (ref 2.2–3.9)
IgA: 319 mg/dL (ref 61–437)
IgG (Immunoglobin G), Serum: 664 mg/dL (ref 603–1613)
IgM (Immunoglobulin M), Srm: 11 mg/dL — ABNORMAL LOW (ref 20–172)
Total Protein ELP: 6.2 g/dL (ref 6.0–8.5)

## 2023-04-21 ENCOUNTER — Inpatient Hospital Stay: Payer: Medicare Other | Attending: Oncology

## 2023-04-21 ENCOUNTER — Inpatient Hospital Stay (HOSPITAL_BASED_OUTPATIENT_CLINIC_OR_DEPARTMENT_OTHER): Payer: Medicare Other | Admitting: Oncology

## 2023-04-21 ENCOUNTER — Encounter: Payer: Self-pay | Admitting: Oncology

## 2023-04-21 VITALS — BP 154/99 | HR 64 | Temp 98.0°F | Resp 18

## 2023-04-21 DIAGNOSIS — T451X5A Adverse effect of antineoplastic and immunosuppressive drugs, initial encounter: Secondary | ICD-10-CM

## 2023-04-21 DIAGNOSIS — G620 Drug-induced polyneuropathy: Secondary | ICD-10-CM

## 2023-04-21 DIAGNOSIS — Z7961 Long term (current) use of immunomodulator: Secondary | ICD-10-CM | POA: Insufficient documentation

## 2023-04-21 DIAGNOSIS — Z86718 Personal history of other venous thrombosis and embolism: Secondary | ICD-10-CM | POA: Diagnosis not present

## 2023-04-21 DIAGNOSIS — Z5111 Encounter for antineoplastic chemotherapy: Secondary | ICD-10-CM

## 2023-04-21 DIAGNOSIS — Z7901 Long term (current) use of anticoagulants: Secondary | ICD-10-CM | POA: Diagnosis not present

## 2023-04-21 DIAGNOSIS — C9000 Multiple myeloma not having achieved remission: Secondary | ICD-10-CM | POA: Diagnosis not present

## 2023-04-21 DIAGNOSIS — I10 Essential (primary) hypertension: Secondary | ICD-10-CM | POA: Diagnosis not present

## 2023-04-21 DIAGNOSIS — E785 Hyperlipidemia, unspecified: Secondary | ICD-10-CM | POA: Insufficient documentation

## 2023-04-21 DIAGNOSIS — Z79899 Other long term (current) drug therapy: Secondary | ICD-10-CM | POA: Insufficient documentation

## 2023-04-21 DIAGNOSIS — I825Y1 Chronic embolism and thrombosis of unspecified deep veins of right proximal lower extremity: Secondary | ICD-10-CM | POA: Diagnosis not present

## 2023-04-21 DIAGNOSIS — F17210 Nicotine dependence, cigarettes, uncomplicated: Secondary | ICD-10-CM | POA: Insufficient documentation

## 2023-04-21 DIAGNOSIS — D702 Other drug-induced agranulocytosis: Secondary | ICD-10-CM

## 2023-04-21 LAB — CBC WITH DIFFERENTIAL (CANCER CENTER ONLY)
Abs Immature Granulocytes: 0 10*3/uL (ref 0.00–0.07)
Basophils Absolute: 0 10*3/uL (ref 0.0–0.1)
Basophils Relative: 1 %
Eosinophils Absolute: 0.4 10*3/uL (ref 0.0–0.5)
Eosinophils Relative: 10 %
HCT: 41.2 % (ref 39.0–52.0)
Hemoglobin: 13.2 g/dL (ref 13.0–17.0)
Immature Granulocytes: 0 %
Lymphocytes Relative: 45 %
Lymphs Abs: 1.6 10*3/uL (ref 0.7–4.0)
MCH: 29.3 pg (ref 26.0–34.0)
MCHC: 32 g/dL (ref 30.0–36.0)
MCV: 91.6 fL (ref 80.0–100.0)
Monocytes Absolute: 0.4 10*3/uL (ref 0.1–1.0)
Monocytes Relative: 10 %
Neutro Abs: 1.2 10*3/uL — ABNORMAL LOW (ref 1.7–7.7)
Neutrophils Relative %: 34 %
Platelet Count: 170 10*3/uL (ref 150–400)
RBC: 4.5 MIL/uL (ref 4.22–5.81)
RDW: 15.9 % — ABNORMAL HIGH (ref 11.5–15.5)
WBC Count: 3.5 10*3/uL — ABNORMAL LOW (ref 4.0–10.5)
nRBC: 0 % (ref 0.0–0.2)

## 2023-04-21 LAB — CMP (CANCER CENTER ONLY)
ALT: 17 U/L (ref 0–44)
AST: 27 U/L (ref 15–41)
Albumin: 4 g/dL (ref 3.5–5.0)
Alkaline Phosphatase: 60 U/L (ref 38–126)
Anion gap: 8 (ref 5–15)
BUN: 10 mg/dL (ref 8–23)
CO2: 24 mmol/L (ref 22–32)
Calcium: 8.8 mg/dL — ABNORMAL LOW (ref 8.9–10.3)
Chloride: 103 mmol/L (ref 98–111)
Creatinine: 1.14 mg/dL (ref 0.61–1.24)
GFR, Estimated: >60 mL/min (ref >60)
Glucose, Bld: 93 mg/dL (ref 70–99)
Potassium: 4.2 mmol/L (ref 3.5–5.1)
Sodium: 135 mmol/L (ref 135–145)
Total Bilirubin: 1.5 mg/dL — ABNORMAL HIGH (ref 0.0–1.2)
Total Protein: 7.1 g/dL (ref 6.5–8.1)

## 2023-04-21 MED ORDER — APIXABAN 5 MG PO TABS: 5.0000 mg | ORAL_TABLET | Freq: Two times a day (BID) | ORAL | 5 refills | Status: DC

## 2023-04-21 NOTE — Assessment & Plan Note (Signed)
Grade 1, stable symptoms.  Observation 

## 2023-04-21 NOTE — Assessment & Plan Note (Signed)
#  IgG multiple myeloma, stage II, del 1 p, high risk.  Declined bone marrow transplant evaluation. 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 Labs are reviewed and discussed with patient. He will start next cycle of Revlimid  5mg  3 weeks on 1 week off [ dose reduced due to neutropenia] Off Acyclovir since he has been 1 year after transplant.   Zometa Q59months -proceed today.  Next due Feb 2025

## 2023-04-21 NOTE — Assessment & Plan Note (Signed)
Treatment as planned 

## 2023-04-21 NOTE — Assessment & Plan Note (Signed)
Stable ANC. monitor

## 2023-04-21 NOTE — Assessment & Plan Note (Signed)
 Recommend patient to take calcium 600mg  daily and Vitamin D 1000 units.

## 2023-04-21 NOTE — Progress Notes (Signed)
 Hematology/Oncology Progress note Telephone:([phone] Fax:([phone]) [phone]        CHIEF COMPLAINTS/REASON FOR VISIT:  Follow-up for multiple myeloma  ASSESSMENT & PLAN:   Cancer Staging  Multiple myeloma not having achieved remission (HCC) Staging form: Plasma Cell Myeloma and Plasma Cell Disorders, AJCC 8th Edition - Clinical stage from 02/04/2020: Beta-2 -microglobulin (mg/L): 2.1, Albumin (g/dL): 2.7, ISS: Stage II, High-risk cytogenetics: Absent, LDH: Unknown - Signed by Babara Call, MD on 05/06/2020   Multiple myeloma not having achieved remission (HCC) #IgG multiple myeloma, stage II, del 1 p, high risk.  Declined bone marrow transplant evaluation. 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 Labs are reviewed and discussed with patient. He will start next cycle of Revlimid   5mg  3 weeks on 1 week off [ dose reduced due to neutropenia] Off Acyclovir  since he has been 1 year after transplant.   Zometa  Q77months -proceed today.  Next due Feb 2025   Chemotherapy-induced neuropathy (HCC) Grade 1, stable symptoms.  Observation  Chronic deep vein thrombosis (DVT) (HCC) Continue anticoagulation with Eliquis  5mg  BID Patient declined repeat lower extremity ultrasound for follow-up of DVT   Drug-induced neutropenia (HCC) Stable ANC. monitor   Encounter for antineoplastic chemotherapy Treatment as planned.  Hypocalcemia Recommend patient to take calcium  600mg  daily and Vitamin D 1000 units.      No orders of the defined types were placed in this encounter.   Lab MD 4 weeks   All questions were answered. The patient knows to call the clinic with any problems, questions or concerns.  Call Babara, MD, PhD [facility] Hematology Oncology 04/21/2023     HISTORY OF PRESENTING ILLNESS:   Tommy Smith. is a  68 y.o.  male presents for follow-up of management of multiple myeloma Oncology history summary listed as below  Oncology History  Multiple myeloma not having achieved remission (HCC)  01/29/2020 Imaging   CT showed multiple bilateral pulmonary nodules measuring up to 10 mm in the right middle lobe, nodules are indeterminate, infectious/inflammatory etiology versus metastatic disease. Multiple osseous lytic lesions, largest lesion involving the left pubic bone concerning for metastatic disease.Compression fractures at T9, age indeterminate. Appearance focal area of thickening at the gastroesophageal junction.  Further evaluation with upper GI study or direct visualization with ndoscopy is recommended No bowel obstruction.Aortic atherosclerosis.   02/04/2020 Cancer Staging   Staging form: Plasma Cell Myeloma and Plasma Cell Disorders, AJCC 8th Edition - Clinical stage from 02/04/2020: Beta-2 -microglobulin (mg/L): 2.1, Albumin (g/dL): 2.7, ISS: Stage II, High-risk cytogenetics: Absent, LDH: Unknown - Signed by Babara Call, MD on 05/06/2020   02/21/2020 Initial Diagnosis   Multiple myeloma  Baseline M protein 4.1, kappa light chain level 333.8 Beta-2  microglobulin 2.1, albumin 2.7.  -02/12/2020, bone marrow biopsy showed hypercellular for age, 80% plasma cell which are complex restricted by light chain in situ heparinization.  Background hematopoiesis is present but reduced. Cytogenetics is normal, Myeloma FISH panel-deletion 1P,Standard risk.   02/27/2020 - 05/06/2021 Chemotherapy   VRd     09/01/2020 Bone Marrow Biopsy   Bone marrow results were reviewed.  1% plasma cell.  Normal cytogenetics.  Myeloma FISH negative for 1p del.   04/21/2021 Bone Marrow Biopsy   Bone marrow after 14 cycles of VRD -[  January 2023 ]showed plasma cells 2%.-   06/25/2021 Bone Marrow Transplant    Patient underwent autologous bone marrow transplant at [facility]   09/2021 - 11/10/2022 Chemotherapy   Revlimid  10 mg 3 weeks on 1 week off   10/26/2021 Imaging   Bilateral lower extremity venous US  Partially occlusive right deep vein thrombus extending from the calf veins into the common femoral vein with limited examination of the left extremity demonstrating partially occlusive thrombus in the common femoral vein.     01/13/2022 Imaging   Bilateral lower extremity venous US  1. 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 2. Extensive predominantly occlusive/near occlusive left lower extremity DVT extending from the left common femoral vein through the imaged tibial veins, age-indeterminate in the absence of prior examinations though echogenic thrombus within the left femoral vein suggests a chronic etiology.     01/20/2022 Bone Marrow Biopsy   -Bone marrow biopsy at [facility] showed Cellular bone marrow aspirate with trilineage hematopoiesis and no morphologic evidence of plasma cell neoplasm.  -Negative minimal residual disease.   12/03/2022 -  Chemotherapy   Revlimid  5mg  3 weeks on, 1 week off.     #Focal area of thickening at the GE junction. 10/23/2020 EGD is normal.   #Lung nodules, measures up to 10 mm on the right middle lobe.  Indeterminate.  Attention on follow-up.  Recommend repeat CT chest and he declined  10/28/2011, unilateral right lower extremity ultrasound showed partially occlusive right deep vein thrombosis extending from the calf vein into the, femoral vein. Patient was recommended to start on Eliquis  for anticoagulation.  He tolerates well.  Right thigh pain has improved.  INTERVAL HISTORY Tommy Smith. is a 67 y.o.  male who has above history reviewed by me today presents for follow up visit for multiple myeloma Patient is currently on Revlimid  5 mg 3 weeks on 1 week off for maintenance.Tolerates well.  Current everyday smoker, 10 cigarettes daily.   Patient denies fever chills.  No new complaints.  Review of Systems  Constitutional:  Negative for appetite change, chills, fatigue, fever and unexpected weight change.  HENT:   Negative for hearing loss and voice change.   Eyes:  Negative for eye problems and icterus.  Respiratory:  Negative for chest tightness, cough and shortness of breath.   Cardiovascular:  Negative for chest pain and leg swelling.  Gastrointestinal:  Negative for abdominal distention, abdominal pain and constipation.  Endocrine: Negative for hot flashes.  Genitourinary:  Negative for difficulty urinating, dysuria and frequency.   Musculoskeletal:  Negative for arthralgias, back pain and neck pain.  Skin:  Negative for itching and rash.  Neurological:  Positive for numbness. Negative for light-headedness.  Hematological:  Negative for adenopathy. Does not bruise/bleed easily.  Psychiatric/Behavioral:  Negative for confusion.      MEDICAL HISTORY:  Past Medical History:  Diagnosis Date   Cancer (HCC)    Depression    Hypercalcemia 03/02/2020   Hyperlipemia    Hypertension    Hypogonadism in male    Multiple myeloma [facility])     SURGICAL HISTORY: Past Surgical History:  Procedure Laterality Date   BONE MARROW BIOPSY     COLONOSCOPY  07/14/2008   ESOPHAGOGASTRODUODENOSCOPY N/A 10/22/2020   Procedure: ESOPHAGOGASTRODUODENOSCOPY (EGD);  Surgeon: Therisa Bi, MD;  Location: [facility]  ENDOSCOPY;  Service: Gastroenterology;  Laterality: N/A;    SOCIAL HISTORY: Social History   Socioeconomic History   Marital status: Married    Spouse name: Delphine   Number of children: 3   Years of education: Not on file   Highest education level: Not on file  Occupational History   Not on  file  Tobacco Use   Smoking status: Every Day    Current packs/day: 0.50    Average packs/day: 0.5 packs/day for 30.0 years (15.0 ttl pk-yrs)    Types: Cigarettes   Smokeless tobacco: Never  Substance and Sexual Activity   Alcohol use: Yes    Comment: occcassional   Drug use: No   Sexual activity: Not on file  Other Topics Concern   Not on file  Social History Narrative   Not on file   Social Drivers of Health   Financial Resource Strain: Medium Risk (11/10/2022)   Overall Financial Resource Strain (CARDIA)    Difficulty of Paying Living Expenses: Somewhat hard  Food Insecurity: Food Insecurity Present (11/10/2022)   Hunger Vital Sign    Worried About Running Out of Food in the Last Year: Sometimes true    Ran Out of Food in the Last Year: Sometimes true  Transportation Needs: No Transportation Needs (07/05/2021)   Received from [facility]   PRAPARE - Transportation    In the past 12 months, has lack of transportation kept you from medical appointments or from getting medications?: No    Lack of Transportation (Non-Medical): No  Physical Activity: Not on file  Stress: Not on file  Social Connections: Not on file  Intimate Partner Violence: Not At Risk (11/10/2022)   Humiliation, Afraid, Rape, and Kick questionnaire    Fear of Current or Ex-Partner: No    Emotionally Abused: No    Physically Abused: No    Sexually Abused: No    FAMILY HISTORY: Family History  Problem Relation Age of Onset   Skin cancer Mother    Prostate cancer Father     ALLERGIES:  has no known allergies.  MEDICATIONS:  Current Outpatient Medications  Medication Sig Dispense Refill   apixaban  (ELIQUIS ) 5 MG TABS tablet Take 1 tablet (5 mg total) by mouth 2 (two) times daily. 60 tablet 2   buPROPion  (WELLBUTRIN  XL) 300 MG 24 hr tablet Take 300 mg by mouth daily.     cholecalciferol (VITAMIN D3) 25 MCG (1000 UNIT) tablet Take 2,000 Units by mouth daily.      lenalidomide  (REVLIMID ) 5 MG capsule Take 1 capsule (5 mg total) by mouth daily. Take for 21 days, then hold for 7 days. Repeat every 28 days. 21 capsule 6   sertraline (ZOLOFT) 50 MG tablet Take 1 tablet by mouth daily.     No current facility-administered medications for this visit.     PHYSICAL EXAMINATION: ECOG PERFORMANCE STATUS: 1 - Symptomatic but completely ambulatory Vitals:   04/21/23 1058 04/21/23 1100  BP: (!) 161/104 (!) 154/99  Pulse: 64   Resp: 18   Temp: 98 F (36.7 C)    There were no vitals filed for this visit.   Physical Exam Constitutional:      General: He is not in acute distress.    Appearance: He is not diaphoretic.  HENT:     Head: Normocephalic and atraumatic.     Nose: Nose normal.     Mouth/Throat:     Pharynx: No oropharyngeal exudate.  Eyes:  General: No scleral icterus.    Pupils: Pupils are equal, round, and reactive to light.  Cardiovascular:     Rate and Rhythm: Normal rate.  Pulmonary:     Effort: Pulmonary effort is normal. No respiratory distress.     Breath sounds: No wheezing.  Abdominal:     General: There is no distension.     Palpations: Abdomen is soft.     Tenderness: There is no abdominal tenderness.  Musculoskeletal:        General: Normal range of motion.     Cervical back: Normal range of motion.     Left lower leg: Edema present.  Skin:    General: Skin is warm and dry.     Findings: No erythema.  Neurological:     Mental Status: He is alert and oriented to person, place, and time. Mental status is at baseline.     Cranial Nerves: No cranial nerve deficit.     Motor: No abnormal muscle tone.  Psychiatric:        Mood and Affect: Mood and affect normal.       LABORATORY DATA:  I have reviewed the data as listed    Latest Ref Rng & Units 04/21/2023   10:41 AM 03/23/2023    1:18 PM 02/24/2023    9:34 AM  CBC  WBC 4.0 - 10.5 K/uL 3.5  3.2  3.6   Hemoglobin 13.0 - 17.0 g/dL 86.7  85.9  86.0    Hematocrit 39.0 - 52.0 % 41.2  43.2  42.9   Platelets 150 - 400 K/uL 170  148  131       Latest Ref Rng & Units 03/23/2023    1:18 PM 02/24/2023    9:34 AM 01/27/2023   11:35 AM  CMP  Glucose 70 - 99 mg/dL 95  72  92   BUN 8 - 23 mg/dL 14  12  13    Creatinine 0.61 - 1.24 mg/dL 8.93  8.90  8.88   Sodium 135 - 145 mmol/L 139  137  137   Potassium 3.5 - 5.1 mmol/L 3.6  3.8  3.8   Chloride 98 - 111 mmol/L 107  106  107   CO2 22 - 32 mmol/L 26  23  24    Calcium  8.9 - 10.3 mg/dL 8.5  8.8  8.7   Total Protein 6.5 - 8.1 g/dL 6.8  6.5  6.8   Total Bilirubin <1.2 mg/dL 0.9  0.9  0.8   Alkaline Phos 38 - 126 U/L 55  56  60   AST 15 - 41 U/L 16  17  19    ALT 0 - 44 U/L 19  16  17      RADIOGRAPHIC STUDIES: I have personally reviewed the radiological images as listed and agreed with the findings in the report. No results found.

## 2023-04-21 NOTE — Assessment & Plan Note (Signed)
Continue anticoagulation with Eliquis 5mg  BID Patient declined repeat lower extremity ultrasound for follow-up of DVT

## 2023-04-24 ENCOUNTER — Other Ambulatory Visit: Payer: Self-pay

## 2023-04-24 LAB — KAPPA/LAMBDA LIGHT CHAINS
Kappa free light chain: 16.2 mg/L (ref 3.3–19.4)
Kappa, lambda light chain ratio: 1.31 (ref 0.26–1.65)
Lambda free light chains: 12.4 mg/L (ref 5.7–26.3)

## 2023-04-28 ENCOUNTER — Telehealth: Payer: Self-pay | Admitting: *Deleted

## 2023-04-28 ENCOUNTER — Other Ambulatory Visit: Payer: Self-pay

## 2023-04-28 DIAGNOSIS — C9000 Multiple myeloma not having achieved remission: Secondary | ICD-10-CM

## 2023-04-28 LAB — MULTIPLE MYELOMA PANEL, SERUM
Albumin SerPl Elph-Mcnc: 3.7 g/dL (ref 2.9–4.4)
Albumin/Glob SerPl: 1.5 (ref 0.7–1.7)
Alpha 1: 0.2 g/dL (ref 0.0–0.4)
Alpha2 Glob SerPl Elph-Mcnc: 0.7 g/dL (ref 0.4–1.0)
B-Globulin SerPl Elph-Mcnc: 1.2 g/dL (ref 0.7–1.3)
Gamma Glob SerPl Elph-Mcnc: 0.5 g/dL (ref 0.4–1.8)
Globulin, Total: 2.6 g/dL (ref 2.2–3.9)
IgA: 314 mg/dL (ref 61–437)
IgG (Immunoglobin G), Serum: 727 mg/dL (ref 603–1613)
IgM (Immunoglobulin M), Srm: 9 mg/dL — ABNORMAL LOW (ref 20–172)
Total Protein ELP: 6.3 g/dL (ref 6.0–8.5)

## 2023-04-28 MED ORDER — LENALIDOMIDE 5 MG PO CAPS: 5.0000 mg | ORAL_CAPSULE | Freq: Every day | ORAL | 6 refills | Status: DC

## 2023-04-28 NOTE — Telephone Encounter (Signed)
[facility] called asking to have a refill on the Revlimid 5 mg where 21 days on and 1 week off. Team will send it in

## 2023-04-28 NOTE — Telephone Encounter (Signed)
Got a call from [facility] about the room survey and it was said on the survey it was unknown about if the pt was active sexual and if anyone male  can have a child.  I called to the patient and asked him and he is sexually active and he is married to a lady and she is not able to have kids.  [facility] also wanted to tell him that anytime that he is having sexual interaction make sure that he is having a condom or something that would prevent whoever he might be with to not have a child.  Now the patient himself is going to call over and do survey so that medicine should be sent out to him.  Patient said he has the number to this survey

## 2023-04-28 NOTE — Telephone Encounter (Signed)
Refill sumbitted to [facility]   Rems# [id number]

## 2023-05-18 ENCOUNTER — Inpatient Hospital Stay: Payer: Medicare Other

## 2023-05-18 ENCOUNTER — Inpatient Hospital Stay: Payer: Medicare Other | Admitting: Oncology

## 2023-06-02 ENCOUNTER — Telehealth: Payer: Self-pay | Admitting: *Deleted

## 2023-06-02 NOTE — Telephone Encounter (Signed)
Sent to [place] team  to get rx for pt

## 2023-06-02 NOTE — Telephone Encounter (Signed)
Looks like pt cancelled appt earlier this month due to financial issues. Please contact pt to r/s

## 2023-06-05 ENCOUNTER — Other Ambulatory Visit: Payer: Medicare Other

## 2023-06-05 ENCOUNTER — Ambulatory Visit: Payer: Medicare Other | Admitting: Oncology

## 2023-06-05 ENCOUNTER — Ambulatory Visit: Payer: Medicare Other

## 2023-06-08 ENCOUNTER — Inpatient Hospital Stay: Payer: Medicare Other | Attending: Oncology

## 2023-06-08 ENCOUNTER — Other Ambulatory Visit: Payer: Self-pay

## 2023-06-08 ENCOUNTER — Inpatient Hospital Stay: Payer: Medicare Other

## 2023-06-08 ENCOUNTER — Encounter: Payer: Self-pay | Admitting: Oncology

## 2023-06-08 ENCOUNTER — Inpatient Hospital Stay: Payer: Medicare Other | Admitting: Oncology

## 2023-06-08 VITALS — BP 128/85 | HR 83 | Temp 97.5°F | Resp 18 | Wt 256.5 lb

## 2023-06-08 DIAGNOSIS — D701 Agranulocytosis secondary to cancer chemotherapy: Secondary | ICD-10-CM | POA: Insufficient documentation

## 2023-06-08 DIAGNOSIS — C9000 Multiple myeloma not having achieved remission: Secondary | ICD-10-CM | POA: Diagnosis present

## 2023-06-08 DIAGNOSIS — G620 Drug-induced polyneuropathy: Secondary | ICD-10-CM | POA: Diagnosis not present

## 2023-06-08 DIAGNOSIS — Z7901 Long term (current) use of anticoagulants: Secondary | ICD-10-CM | POA: Insufficient documentation

## 2023-06-08 DIAGNOSIS — Z86718 Personal history of other venous thrombosis and embolism: Secondary | ICD-10-CM | POA: Diagnosis not present

## 2023-06-08 DIAGNOSIS — D702 Other drug-induced agranulocytosis: Secondary | ICD-10-CM | POA: Diagnosis not present

## 2023-06-08 DIAGNOSIS — T451X5A Adverse effect of antineoplastic and immunosuppressive drugs, initial encounter: Secondary | ICD-10-CM | POA: Diagnosis not present

## 2023-06-08 DIAGNOSIS — Z9481 Bone marrow transplant status: Secondary | ICD-10-CM

## 2023-06-08 DIAGNOSIS — Z5111 Encounter for antineoplastic chemotherapy: Secondary | ICD-10-CM

## 2023-06-08 DIAGNOSIS — I825Y1 Chronic embolism and thrombosis of unspecified deep veins of right proximal lower extremity: Secondary | ICD-10-CM

## 2023-06-08 LAB — CMP (CANCER CENTER ONLY)
ALT: 16 U/L (ref 0–44)
AST: 15 U/L (ref 15–41)
Albumin: 4 g/dL (ref 3.5–5.0)
Alkaline Phosphatase: 57 U/L (ref 38–126)
Anion gap: 10 (ref 5–15)
BUN: 12 mg/dL (ref 8–23)
CO2: 25 mmol/L (ref 22–32)
Calcium: 9 mg/dL (ref 8.9–10.3)
Chloride: 104 mmol/L (ref 98–111)
Creatinine: 0.98 mg/dL (ref 0.61–1.24)
GFR, Estimated: >60 mL/min (ref >60)
Glucose, Bld: 95 mg/dL (ref 70–99)
Potassium: 4 mmol/L (ref 3.5–5.1)
Sodium: 139 mmol/L (ref 135–145)
Total Bilirubin: 1.3 mg/dL — ABNORMAL HIGH (ref 0.0–1.2)
Total Protein: 6.9 g/dL (ref 6.5–8.1)

## 2023-06-08 LAB — CBC WITH DIFFERENTIAL (CANCER CENTER ONLY)
Abs Immature Granulocytes: 0.01 10*3/uL (ref 0.00–0.07)
Basophils Absolute: 0 10*3/uL (ref 0.0–0.1)
Basophils Relative: 0 %
Eosinophils Absolute: 0.3 10*3/uL (ref 0.0–0.5)
Eosinophils Relative: 8 %
HCT: 42.9 % (ref 39.0–52.0)
Hemoglobin: 14.1 g/dL (ref 13.0–17.0)
Immature Granulocytes: 0 %
Lymphocytes Relative: 36 %
Lymphs Abs: 1.3 10*3/uL (ref 0.7–4.0)
MCH: 30.1 pg (ref 26.0–34.0)
MCHC: 32.9 g/dL (ref 30.0–36.0)
MCV: 91.5 fL (ref 80.0–100.0)
Monocytes Absolute: 0.4 10*3/uL (ref 0.1–1.0)
Monocytes Relative: 10 %
Neutro Abs: 1.7 10*3/uL (ref 1.7–7.7)
Neutrophils Relative %: 46 %
Platelet Count: 129 10*3/uL — ABNORMAL LOW (ref 150–400)
RBC: 4.69 MIL/uL (ref 4.22–5.81)
RDW: 16.6 % — ABNORMAL HIGH (ref 11.5–15.5)
WBC Count: 3.8 10*3/uL — ABNORMAL LOW (ref 4.0–10.5)
nRBC: 0 % (ref 0.0–0.2)

## 2023-06-08 MED ORDER — SODIUM CHLORIDE 0.9 % IV SOLN
INTRAVENOUS | Status: DC
  Filled 2023-06-08: qty 250

## 2023-06-08 MED ORDER — LENALIDOMIDE 5 MG PO CAPS: 5.0000 mg | ORAL_CAPSULE | Freq: Every day | ORAL | 6 refills | Status: DC

## 2023-06-08 MED ORDER — ZOLEDRONIC ACID 4 MG/100ML IV SOLN
4.0000 mg | Freq: Once | INTRAVENOUS | Status: AC
  Administered 2023-06-08: 4 mg via INTRAVENOUS
  Filled 2023-06-08: qty 100

## 2023-06-08 NOTE — Assessment & Plan Note (Signed)
 Recommend patient to take calcium 600mg  daily and Vitamin D 1000 units.

## 2023-06-08 NOTE — Progress Notes (Signed)
 Hematology/Oncology Progress note [phone] [phone]        CHIEF COMPLAINTS/REASON FOR VISIT:  Follow-up for multiple myeloma  ASSESSMENT & PLAN:   Cancer Staging  Multiple myeloma not having achieved remission (HCC) Staging form: Plasma Cell Myeloma and Plasma Cell Disorders, AJCC 8th Edition - Clinical stage from 02/04/2020: Beta-2-microglobulin (mg/L): 2.1, Albumin (g/dL): 2.7, ISS: Stage II, High-risk cytogenetics: Absent, LDH: Unknown - Signed by Rickard Patience, MD on 05/06/2020   Multiple myeloma not having achieved remission (HCC) #IgG multiple myeloma, stage II, del 1 p, high risk.  Declined bone marrow transplant evaluation. 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 Labs are reviewed and discussed with patient. 2/14 He started current cycle of Revlimid  5mg  3 weeks on 1 week off [ dose reduced due to neutropenia] when he gets his supply.  Off Acyclovir since he has been 1 year after transplant.   Zometa Q31months -proceed today, Next due May 2025   Chemotherapy-induced neuropathy (HCC) Grade 1, stable symptoms.  Observation  Chronic deep vein thrombosis (DVT) (HCC) Continue anticoagulation with Eliquis 5mg  BID Patient declined repeat lower extremity ultrasound for follow-up of DVT   Drug-induced neutropenia (HCC) Stable ANC. monitor   Encounter for antineoplastic chemotherapy Treatment as planned.  History of bone marrow transplant [facility]) Patient follows up with oncology for post marrow transplant vaccination. Off acyclovir 400 mg twice daily   Hypocalcemia Recommend patient to take calcium 600mg  daily and Vitamin D 1000 units.      No orders of the  defined types were placed in this encounter.  Follow up LOS   All questions were answered. The patient knows to call the clinic with any problems, questions or concerns.  Rickard Patience, MD, PhD [facility] Hematology Oncology 06/08/2023     HISTORY OF PRESENTING ILLNESS:   Tommy Elizondo. is a  68 y.o.  male presents for follow-up of management of multiple myeloma Oncology history summary listed as below  Oncology History  Multiple myeloma not having achieved remission (HCC)  01/29/2020 Imaging   CT showed multiple bilateral pulmonary nodules measuring up to 10 mm in the right middle lobe, nodules are indeterminate, infectious/inflammatory etiology versus metastatic disease. Multiple osseous lytic lesions, largest lesion involving the left pubic bone concerning for metastatic disease.Compression fractures at T9, age indeterminate. Appearance focal area of thickening at the gastroesophageal junction.  Further evaluation with upper GI study or direct visualization with ndoscopy is recommended No bowel obstruction.Aortic atherosclerosis.   02/04/2020 Cancer Staging   Staging form: Plasma Cell Myeloma and Plasma Cell Disorders, AJCC 8th Edition - Clinical stage from 02/04/2020: Beta-2-microglobulin (mg/L): 2.1, Albumin (g/dL): 2.7, ISS: Stage II, High-risk cytogenetics: Absent, LDH: Unknown - Signed by Rickard Patience, MD on 05/06/2020   02/21/2020 Initial Diagnosis   Multiple myeloma  Baseline M protein 4.1, kappa light chain level 333.8 Beta-2 microglobulin 2.1, albumin 2.7.  -02/12/2020, bone marrow biopsy showed hypercellular for age, 80% plasma cell which are complex restricted by light chain in situ heparinization.  Background hematopoiesis is present but reduced. Cytogenetics is normal, Myeloma FISH panel-deletion 1P,Standard risk.   02/27/2020 - 05/06/2021 Chemotherapy   VRd     09/01/2020 Bone Marrow Biopsy   Bone marrow results were reviewed.  1%  plasma cell.  Normal cytogenetics.   Myeloma FISH negative for 1p del.   04/21/2021 Bone Marrow Biopsy   Bone marrow after 14 cycles of VRD -[January 2023 ]showed plasma cells 2%.-   06/25/2021 Bone Marrow Transplant   Patient underwent autologous bone marrow transplant at [facility]   09/2021 - 11/10/2022 Chemotherapy   Revlimid 10 mg 3 weeks on 1 week off   10/26/2021 Imaging   Bilateral lower extremity venous US Partially occlusive right deep vein thrombus extending from the calf veins into the common femoral vein with limited examination of the left extremity demonstrating partially occlusive thrombus in the common femoral vein.     01/13/2022 Imaging   Bilateral lower extremity venous US 1. 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 2. Extensive predominantly occlusive/near occlusive left lower extremity DVT extending from the left common femoral vein through the imaged tibial veins, age-indeterminate in the absence of prior examinations though echogenic thrombus within the left femoral vein suggests a chronic etiology.     01/20/2022 Bone Marrow Biopsy   -Bone marrow biopsy at [facility] showed Cellular bone marrow aspirate with trilineage hematopoiesis and no morphologic evidence of plasma cell neoplasm.  -Negative minimal residual disease.   12/03/2022 -  Chemotherapy   Revlimid 5mg  3 weeks on, 1 week off.     #Focal area of thickening at the GE junction. 10/23/2020 EGD is normal.   #Lung nodules, measures up to 10 mm on the right middle lobe.  Indeterminate.  Attention on follow-up.  Recommend repeat CT chest and he declined  10/28/2011, unilateral right lower extremity ultrasound showed partially occlusive right deep vein thrombosis extending from the calf vein into the,  femoral vein. Patient was recommended to start on Eliquis for anticoagulation.  He tolerates well.  Right thigh pain has improved.  INTERVAL HISTORY Tommy Smith. is a 68 y.o. male who has above history reviewed by me today presents for follow up visit for multiple myeloma Patient is currently on Revlimid 5 mg 3 weeks on 1 week off for maintenance, self started on 05/26/2023 He tolerates well. His MD appt was rescheduled to today.  Current everyday smoker, 10 cigarettes daily.   Patient denies fever chills.  No new complaints.  Review of Systems  Constitutional:  Negative for appetite change, chills, fatigue, fever and unexpected weight change.  HENT:   Negative for hearing loss and voice change.   Eyes:  Negative for eye problems and icterus.  Respiratory:  Negative for chest tightness, cough and shortness of breath.   Cardiovascular:  Negative for chest pain and leg swelling.  Gastrointestinal:  Negative for abdominal distention, abdominal pain and constipation.  Endocrine: Negative for hot flashes.  Genitourinary:  Negative for difficulty urinating, dysuria and frequency.   Musculoskeletal:  Negative for arthralgias, back pain and neck pain.  Skin:  Negative for itching and rash.  Neurological:  Positive for numbness. Negative for light-headedness.  Hematological:  Negative for adenopathy. Does not bruise/bleed easily.  Psychiatric/Behavioral:  Negative for confusion.      MEDICAL HISTORY:  Past Medical History:  Diagnosis Date   Cancer [facility])    Depression    Hypercalcemia 03/02/2020   Hyperlipemia    Hypertension    Hypogonadism in male    Multiple myeloma (HCC)  SURGICAL HISTORY: Past Surgical History:  Procedure Laterality Date   BONE MARROW BIOPSY     COLONOSCOPY  07/14/2008   ESOPHAGOGASTRODUODENOSCOPY N/A 10/22/2020   Procedure: ESOPHAGOGASTRODUODENOSCOPY (EGD);  Surgeon: Wyline Mood, MD;  Location: [facility] ENDOSCOPY;  Service: Gastroenterology;  Laterality:  N/A;    SOCIAL HISTORY: Social History   Socioeconomic History   Marital status: Married    Spouse name: Cyndia Bent   Number of children: 3   Years of education: Not on file   Highest education level: Not on file  Occupational History   Not on file  Tobacco Use   Smoking status: Every Day    Current packs/day: 0.50    Average packs/day: 0.5 packs/day for 30.0 years (15.0 ttl pk-yrs)    Types: Cigarettes   Smokeless tobacco: Never  Substance and Sexual Activity   Alcohol use: Yes    Comment: occcassional   Drug use: No   Sexual activity: Not on file  Other Topics Concern   Not on file  Social History Narrative   Not on file   Social Drivers of Health   Financial Resource Strain: Medium Risk (11/10/2022)   Overall Financial Resource Strain (CARDIA)    Difficulty of Paying Living Expenses: Somewhat hard  Food Insecurity: Food Insecurity Present (11/10/2022)   Hunger Vital Sign    Worried About Running Out of Food in the Last Year: Sometimes true    Ran Out of Food in the Last Year: Sometimes true  Transportation Needs: No Transportation Needs (07/05/2021)   Received from [facility], [facility]   PRAPARE - Transportation    In the past 12 months, has lack of transportation kept you from medical appointments or from getting medications?: No    Lack of Transportation (Non-Medical): No  Physical Activity: Not on file  Stress: Not on file  Social Connections: Not on file  Intimate Partner Violence: Not At Risk (11/10/2022)   Humiliation, Afraid, Rape, and Kick questionnaire    Fear of Current or Ex-Partner: No    Emotionally Abused: No    Physically Abused: No    Sexually Abused: No    FAMILY HISTORY: Family History  Problem Relation Age of Onset   Skin cancer Mother    Prostate cancer Father     ALLERGIES:  has no known allergies.  MEDICATIONS:  Current Outpatient Medications  Medication Sig Dispense Refill   apixaban (ELIQUIS)  5 MG TABS tablet Take 1 tablet (5 mg total) by mouth 2 (two) times daily. 60 tablet 5   buPROPion (WELLBUTRIN XL) 300 MG 24 hr tablet Take 300 mg by mouth daily.     cholecalciferol (VITAMIN D3) 25 MCG (1000 UNIT) tablet Take 2,000 Units by mouth daily.     lenalidomide (REVLIMID) 5 MG capsule Take 1 capsule (5 mg total) by mouth daily. Take for 21 days, then hold for 7 days. Repeat every 28 days. 21 capsule 6   sertraline (ZOLOFT) 50 MG tablet Take 1 tablet by mouth daily.     No current facility-administered medications for this visit.     PHYSICAL EXAMINATION: ECOG PERFORMANCE STATUS: 1 - Symptomatic but completely ambulatory Vitals:   06/08/23 1325  BP: 128/85  Pulse: 83  Resp: 18  Temp: (!) 97.5 F (36.4 C)  SpO2: 100%    Filed Weights   06/08/23 1325  Weight: 256 lb 8 oz (116.3 kg)     Physical Exam Constitutional:      General: He is not  in acute distress.    Appearance: He is not diaphoretic.  HENT:     Head: Normocephalic and atraumatic.     Nose: Nose normal.     Mouth/Throat:     Pharynx: No oropharyngeal exudate.  Eyes:     General: No scleral icterus.    Pupils: Pupils are equal, round, and reactive to light.  Cardiovascular:     Rate and Rhythm: Normal rate.  Pulmonary:     Effort: Pulmonary effort is normal. No respiratory distress.     Breath sounds: No wheezing.  Abdominal:     General: There is no distension.     Palpations: Abdomen is soft.     Tenderness: There is no abdominal tenderness.  Musculoskeletal:        General: Normal range of motion.     Cervical back: Normal range of motion.     Left lower leg: Edema present.  Skin:    General: Skin is warm and dry.     Findings: No erythema.  Neurological:     Mental Status: He is alert and oriented to person, place, and time. Mental status is at baseline.     Cranial Nerves: No cranial nerve deficit.     Motor: No abnormal muscle tone.  Psychiatric:        Mood and Affect: Mood and affect  normal.       LABORATORY DATA:  I have reviewed the data as listed    Latest Ref Rng & Units 04/21/2023   10:41 AM 03/23/2023    1:18 PM 02/24/2023    9:34 AM  CBC  WBC 4.0 - 10.5 K/uL 3.5  3.2  3.6   Hemoglobin 13.0 - 17.0 g/dL 82.9  56.2  13.0   Hematocrit 39.0 - 52.0 % 41.2  43.2  42.9   Platelets 150 - 400 K/uL 170  148  131       Latest Ref Rng & Units 04/21/2023   10:41 AM 03/23/2023    1:18 PM 02/24/2023    9:34 AM  CMP  Glucose 70 - 99 mg/dL 93  95  72   BUN 8 - 23 mg/dL 10  14  12    Creatinine 0.61 - 1.24 mg/dL 8.65  7.84  6.96   Sodium 135 - 145 mmol/L 135  139  137   Potassium 3.5 - 5.1 mmol/L 4.2  3.6  3.8   Chloride 98 - 111 mmol/L 103  107  106   CO2 22 - 32 mmol/L 24  26  23    Calcium 8.9 - 10.3 mg/dL 8.8  8.5  8.8   Total Protein 6.5 - 8.1 g/dL 7.1  6.8  6.5   Total Bilirubin 0.0 - 1.2 mg/dL 1.5  0.9  0.9   Alkaline Phos 38 - 126 U/L 60  55  56   AST 15 - 41 U/L 27  16  17    ALT 0 - 44 U/L 17  19  16      RADIOGRAPHIC STUDIES: I have personally reviewed the radiological images as listed and agreed with the findings in the report. No results found.

## 2023-06-08 NOTE — Assessment & Plan Note (Signed)
Patient follows up with oncology for post marrow transplant vaccination. Off acyclovir 400 mg twice daily

## 2023-06-08 NOTE — Patient Instructions (Signed)

## 2023-06-08 NOTE — Assessment & Plan Note (Signed)
Continue anticoagulation with Eliquis 5mg  BID Patient declined repeat lower extremity ultrasound for follow-up of DVT

## 2023-06-08 NOTE — Assessment & Plan Note (Signed)
Treatment as planned 

## 2023-06-08 NOTE — Assessment & Plan Note (Signed)
Stable ANC. monitor

## 2023-06-08 NOTE — Assessment & Plan Note (Signed)
Grade 1, stable symptoms.  Observation 

## 2023-06-08 NOTE — Assessment & Plan Note (Addendum)
#  IgG multiple myeloma, stage II, del 1 p, high risk.  Declined bone marrow transplant evaluation. 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 Labs are reviewed and discussed with patient. 2/14 He started current cycle of Revlimid  5mg  3 weeks on 1 week off [ dose reduced due to neutropenia] when he gets his supply.  Off Acyclovir since he has been 1 year after transplant.   Zometa Q87months -proceed today, Next due May 2025

## 2023-06-09 LAB — KAPPA/LAMBDA LIGHT CHAINS
Kappa free light chain: 13.7 mg/L (ref 3.3–19.4)
Kappa, lambda light chain ratio: 1.46 (ref 0.26–1.65)
Lambda free light chains: 9.4 mg/L (ref 5.7–26.3)

## 2023-06-12 LAB — MULTIPLE MYELOMA PANEL, SERUM
Albumin SerPl Elph-Mcnc: 3.8 g/dL (ref 2.9–4.4)
Albumin/Glob SerPl: 1.6 (ref 0.7–1.7)
Alpha 1: 0.2 g/dL (ref 0.0–0.4)
Alpha2 Glob SerPl Elph-Mcnc: 0.6 g/dL (ref 0.4–1.0)
B-Globulin SerPl Elph-Mcnc: 1 g/dL (ref 0.7–1.3)
Gamma Glob SerPl Elph-Mcnc: 0.6 g/dL (ref 0.4–1.8)
Globulin, Total: 2.4 g/dL (ref 2.2–3.9)
IgA: 312 mg/dL (ref 61–437)
IgG (Immunoglobin G), Serum: 741 mg/dL (ref 603–1613)
IgM (Immunoglobulin M), Srm: 11 mg/dL — ABNORMAL LOW (ref 20–172)
Total Protein ELP: 6.2 g/dL (ref 6.0–8.5)

## 2023-06-15 ENCOUNTER — Telehealth: Payer: Self-pay | Admitting: Oncology

## 2023-06-15 NOTE — Telephone Encounter (Signed)
 Patient is questioning the reasoning for lab appointment on 3/14 after he just got labs at the end of February. I let him know that was what the wrap up asked for and he wants to understand why. Please cal him at  [phone]

## 2023-06-23 ENCOUNTER — Other Ambulatory Visit: Payer: Medicare Other

## 2023-06-28 ENCOUNTER — Other Ambulatory Visit: Payer: Self-pay

## 2023-06-28 DIAGNOSIS — C9000 Multiple myeloma not having achieved remission: Secondary | ICD-10-CM

## 2023-06-29 ENCOUNTER — Inpatient Hospital Stay: Attending: Oncology

## 2023-06-29 DIAGNOSIS — D702 Other drug-induced agranulocytosis: Secondary | ICD-10-CM | POA: Insufficient documentation

## 2023-06-29 DIAGNOSIS — Z9221 Personal history of antineoplastic chemotherapy: Secondary | ICD-10-CM | POA: Insufficient documentation

## 2023-06-29 DIAGNOSIS — F17210 Nicotine dependence, cigarettes, uncomplicated: Secondary | ICD-10-CM | POA: Insufficient documentation

## 2023-06-29 DIAGNOSIS — Z808 Family history of malignant neoplasm of other organs or systems: Secondary | ICD-10-CM | POA: Insufficient documentation

## 2023-06-29 DIAGNOSIS — Z8042 Family history of malignant neoplasm of prostate: Secondary | ICD-10-CM | POA: Insufficient documentation

## 2023-06-29 DIAGNOSIS — Z7901 Long term (current) use of anticoagulants: Secondary | ICD-10-CM | POA: Insufficient documentation

## 2023-06-29 DIAGNOSIS — Z9481 Bone marrow transplant status: Secondary | ICD-10-CM | POA: Insufficient documentation

## 2023-06-29 DIAGNOSIS — I82513 Chronic embolism and thrombosis of femoral vein, bilateral: Secondary | ICD-10-CM | POA: Insufficient documentation

## 2023-06-29 DIAGNOSIS — G620 Drug-induced polyneuropathy: Secondary | ICD-10-CM | POA: Insufficient documentation

## 2023-06-29 DIAGNOSIS — C9000 Multiple myeloma not having achieved remission: Secondary | ICD-10-CM | POA: Insufficient documentation

## 2023-07-03 ENCOUNTER — Telehealth: Payer: Self-pay

## 2023-07-03 ENCOUNTER — Encounter: Payer: Self-pay | Admitting: Oncology

## 2023-07-03 ENCOUNTER — Inpatient Hospital Stay (HOSPITAL_BASED_OUTPATIENT_CLINIC_OR_DEPARTMENT_OTHER): Admitting: Oncology

## 2023-07-03 ENCOUNTER — Inpatient Hospital Stay

## 2023-07-03 ENCOUNTER — Other Ambulatory Visit: Payer: Self-pay

## 2023-07-03 VITALS — BP 141/92 | HR 65 | Temp 97.7°F | Resp 18 | Wt 258.6 lb

## 2023-07-03 DIAGNOSIS — Z9481 Bone marrow transplant status: Secondary | ICD-10-CM

## 2023-07-03 DIAGNOSIS — C9000 Multiple myeloma not having achieved remission: Secondary | ICD-10-CM

## 2023-07-03 DIAGNOSIS — I825Y1 Chronic embolism and thrombosis of unspecified deep veins of right proximal lower extremity: Secondary | ICD-10-CM

## 2023-07-03 DIAGNOSIS — Z8042 Family history of malignant neoplasm of prostate: Secondary | ICD-10-CM | POA: Diagnosis not present

## 2023-07-03 DIAGNOSIS — G620 Drug-induced polyneuropathy: Secondary | ICD-10-CM

## 2023-07-03 DIAGNOSIS — Z5111 Encounter for antineoplastic chemotherapy: Secondary | ICD-10-CM

## 2023-07-03 DIAGNOSIS — D702 Other drug-induced agranulocytosis: Secondary | ICD-10-CM | POA: Diagnosis not present

## 2023-07-03 DIAGNOSIS — Z9221 Personal history of antineoplastic chemotherapy: Secondary | ICD-10-CM | POA: Diagnosis not present

## 2023-07-03 DIAGNOSIS — Z7901 Long term (current) use of anticoagulants: Secondary | ICD-10-CM | POA: Diagnosis not present

## 2023-07-03 DIAGNOSIS — I82513 Chronic embolism and thrombosis of femoral vein, bilateral: Secondary | ICD-10-CM | POA: Diagnosis not present

## 2023-07-03 DIAGNOSIS — F17210 Nicotine dependence, cigarettes, uncomplicated: Secondary | ICD-10-CM | POA: Diagnosis not present

## 2023-07-03 DIAGNOSIS — T451X5A Adverse effect of antineoplastic and immunosuppressive drugs, initial encounter: Secondary | ICD-10-CM

## 2023-07-03 DIAGNOSIS — Z808 Family history of malignant neoplasm of other organs or systems: Secondary | ICD-10-CM | POA: Diagnosis not present

## 2023-07-03 LAB — CMP (CANCER CENTER ONLY)
ALT: 15 U/L (ref 0–44)
AST: 17 U/L (ref 15–41)
Albumin: 3.6 g/dL (ref 3.5–5.0)
Alkaline Phosphatase: 46 U/L (ref 38–126)
Anion gap: 8 (ref 5–15)
BUN: 12 mg/dL (ref 8–23)
CO2: 24 mmol/L (ref 22–32)
Calcium: 8.9 mg/dL (ref 8.9–10.3)
Chloride: 103 mmol/L (ref 98–111)
Creatinine: 1.13 mg/dL (ref 0.61–1.24)
GFR, Estimated: >60 mL/min (ref >60)
Glucose, Bld: 111 mg/dL — ABNORMAL HIGH (ref 70–99)
Potassium: 4 mmol/L (ref 3.5–5.1)
Sodium: 135 mmol/L (ref 135–145)
Total Bilirubin: 0.9 mg/dL (ref 0.0–1.2)
Total Protein: 6.5 g/dL (ref 6.5–8.1)

## 2023-07-03 LAB — CBC WITH DIFFERENTIAL (CANCER CENTER ONLY)
Abs Immature Granulocytes: 0 10*3/uL (ref 0.00–0.07)
Basophils Absolute: 0 10*3/uL (ref 0.0–0.1)
Basophils Relative: 0 %
Eosinophils Absolute: 0.4 10*3/uL (ref 0.0–0.5)
Eosinophils Relative: 13 %
HCT: 41.7 % (ref 39.0–52.0)
Hemoglobin: 13.5 g/dL (ref 13.0–17.0)
Immature Granulocytes: 0 %
Lymphocytes Relative: 41 %
Lymphs Abs: 1.2 10*3/uL (ref 0.7–4.0)
MCH: 29.9 pg (ref 26.0–34.0)
MCHC: 32.4 g/dL (ref 30.0–36.0)
MCV: 92.3 fL (ref 80.0–100.0)
Monocytes Absolute: 0.3 10*3/uL (ref 0.1–1.0)
Monocytes Relative: 10 %
Neutro Abs: 1.1 10*3/uL — ABNORMAL LOW (ref 1.7–7.7)
Neutrophils Relative %: 36 %
Platelet Count: 125 10*3/uL — ABNORMAL LOW (ref 150–400)
RBC: 4.52 MIL/uL (ref 4.22–5.81)
RDW: 16.7 % — ABNORMAL HIGH (ref 11.5–15.5)
WBC Count: 3 10*3/uL — ABNORMAL LOW (ref 4.0–10.5)
nRBC: 0 % (ref 0.0–0.2)

## 2023-07-03 MED ORDER — APIXABAN 5 MG PO TABS: 5.0000 mg | ORAL_TABLET | Freq: Two times a day (BID) | ORAL | 5 refills | Status: DC

## 2023-07-03 NOTE — Assessment & Plan Note (Signed)
 Recommend patient to take calcium 600mg  daily and Vitamin D 1000 units.

## 2023-07-03 NOTE — Assessment & Plan Note (Signed)
Patient follows up with oncology for post marrow transplant vaccination. Off acyclovir 400 mg twice daily

## 2023-07-03 NOTE — Telephone Encounter (Signed)
 Application for Eliquis patient assistance completed and faxed to [facility] @ [phone].   Fax confirmation received.

## 2023-07-03 NOTE — Assessment & Plan Note (Signed)
Treatment as planned 

## 2023-07-03 NOTE — Assessment & Plan Note (Signed)
Stable ANC. monitor

## 2023-07-03 NOTE — Assessment & Plan Note (Addendum)
#  IgG multiple myeloma, stage II, del 1 p, high risk.  Declined bone marrow transplant evaluation. 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 Patient is on maintenance Revlimid  5mg  3 weeks on 1 week off [ dose reduced due to neutropenia]  Labs are reviewed and discussed with patient. Patient was asked to bring his pill bottle to clinic and remaining pills were counted.  Still has 5 pills left.  He probably started current cycle around 06/17/2023. Finish current cycle. We discussed about importance of getting blood work done prior to starting next cycle of Revlimid.

## 2023-07-03 NOTE — Progress Notes (Addendum)
 Hematology/Oncology Progress note [phone] [phone]        CHIEF COMPLAINTS/REASON FOR VISIT:  Follow-up for multiple myeloma  ASSESSMENT & PLAN:   Cancer Staging  Multiple myeloma not having achieved remission (HCC) Staging form: Plasma Cell Myeloma and Plasma Cell Disorders, AJCC 8th Edition - Clinical stage from 02/04/2020: Beta-2-microglobulin (mg/L): 2.1, Albumin (g/dL): 2.7, ISS: Stage II, High-risk cytogenetics: Absent, LDH: Unknown - Signed by Rickard Patience, MD on 05/06/2020   Multiple myeloma not having achieved remission (HCC) #IgG multiple myeloma, stage II, del 1 p, high risk.  Declined bone marrow transplant evaluation. 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 Patient is on maintenance Revlimid  5mg  3 weeks on 1 week off [ dose reduced due to neutropenia]  Labs are reviewed and discussed with patient. Patient was asked to bring his pill bottle to clinic and remaining pills were counted.  Still has 5 pills left.  He probably started current cycle around 06/17/2023. Finish current cycle. We discussed about importance of getting blood work done prior to starting next cycle of Revlimid.    Chemotherapy-induced neuropathy (HCC) Grade 1, stable symptoms.  Observation  Chronic deep vein thrombosis (DVT) (HCC) Continue anticoagulation with Eliquis 5mg  BID Patient declined repeat lower extremity ultrasound for follow-up of DVT   Drug-induced neutropenia (HCC) Stable ANC. monitor   Encounter for antineoplastic chemotherapy Treatment as planned.  History of bone marrow transplant [facility]) Patient follows up with oncology for post marrow transplant vaccination. Off  acyclovir 400 mg twice daily   Hypocalcemia Recommend patient to take calcium 600mg  daily and Vitamin D 1000 units.      No orders of the defined types were placed in this encounter.  Follow up LOS   All questions were answered. The patient knows to call the clinic with any problems, questions or concerns.  Rickard Patience, MD, PhD [facility] Hematology Oncology 07/03/2023     HISTORY OF PRESENTING ILLNESS:   Tommy Smith. is a  68 y.o.  male presents for follow-up of management of multiple myeloma Oncology history summary listed as below  Oncology History  Multiple myeloma not having achieved remission (HCC)  01/29/2020 Imaging   CT showed multiple bilateral pulmonary nodules measuring up to 10 mm in the right middle lobe, nodules are indeterminate, infectious/inflammatory etiology versus metastatic disease. Multiple osseous lytic lesions, largest lesion involving the left pubic bone concerning for metastatic disease.Compression fractures at T9, age indeterminate. Appearance focal area of thickening at the gastroesophageal junction.  Further evaluation with upper GI study or direct visualization with ndoscopy is recommended No bowel obstruction.Aortic atherosclerosis.   02/04/2020 Cancer Staging   Staging form: Plasma Cell Myeloma and Plasma Cell Disorders, AJCC 8th Edition - Clinical stage from 02/04/2020: Beta-2-microglobulin (mg/L): 2.1, Albumin (g/dL): 2.7, ISS: Stage II, High-risk cytogenetics: Absent, LDH: Unknown - Signed by Rickard Patience, MD on 05/06/2020   02/21/2020 Initial Diagnosis   Multiple myeloma  Baseline M protein 4.1, kappa light chain level 333.8 Beta-2 microglobulin 2.1, albumin 2.7.  -02/12/2020, bone marrow biopsy showed hypercellular for age, 80% plasma cell which are complex restricted by light chain in situ heparinization.  Background hematopoiesis is present but reduced. Cytogenetics is normal, Myeloma FISH panel-deletion 1P,Standard risk.   02/27/2020 -  05/06/2021 Chemotherapy   VRd     09/01/2020 Bone Marrow Biopsy   Bone marrow results were reviewed.  1% plasma cell.  Normal cytogenetics.  Myeloma FISH negative for 1p del.   04/21/2021 Bone Marrow Biopsy   Bone marrow after 14 cycles of VRD -[January 2023 ]showed plasma cells 2%.-   06/25/2021 Bone Marrow Transplant   Patient underwent autologous bone marrow transplant at [facility]   09/2021 - 11/10/2022 Chemotherapy   Revlimid 10 mg 3 weeks on 1 week off   10/26/2021 Imaging   Bilateral lower extremity venous US Partially occlusive right deep vein thrombus extending from the calf veins into the common femoral vein with limited examination of the left extremity demonstrating partially occlusive thrombus in the common femoral vein.     01/13/2022 Imaging   Bilateral lower extremity venous US 1. 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 2. Extensive predominantly occlusive/near occlusive left lower extremity DVT extending from the left common femoral vein through the imaged tibial veins, age-indeterminate in the absence of prior examinations though echogenic thrombus within the left femoral vein suggests a chronic etiology.     01/20/2022 Bone Marrow Biopsy   -Bone marrow biopsy at [facility] showed Cellular bone marrow aspirate with trilineage hematopoiesis and no morphologic evidence of plasma cell neoplasm.  -Negative minimal residual disease.   12/03/2022 -  Chemotherapy   Revlimid 5mg  3 weeks on, 1 week off.     #Focal area of thickening at the GE junction. 10/23/2020 EGD is normal.   #Lung nodules, measures up to 10 mm on the right middle lobe.  Indeterminate.  Attention on follow-up.  Recommend repeat CT chest and he  declined  10/28/2011, unilateral right lower extremity ultrasound showed partially occlusive right deep vein thrombosis extending from the calf vein into the, femoral vein. Patient was recommended to start on Eliquis for anticoagulation.  He tolerates well.  Right thigh pain has improved.  INTERVAL HISTORY Navjot Loera. is a 68 y.o. male who has above history reviewed by me today presents for follow up visit for multiple myeloma Patient is currently on Revlimid 5 mg 3 weeks on 1 week off for maintenance, he missed his lab appointment and he does not know when that he started on current bottle of Revlimid.  Patient reports that he has depression and is not taking Zoloft. Current everyday smoker, 10 cigarettes daily.   Patient denies fever chills.  No new complaints.  Review of Systems  Constitutional:  Negative for appetite change, chills, fatigue, fever and unexpected weight change.  HENT:   Negative for hearing loss and voice change.   Eyes:  Negative for eye problems and icterus.  Respiratory:  Negative for chest tightness, cough and shortness of breath.   Cardiovascular:  Negative for chest pain and leg swelling.  Gastrointestinal:  Negative for abdominal distention, abdominal pain and constipation.  Endocrine: Negative for hot flashes.  Genitourinary:  Negative for difficulty urinating, dysuria and frequency.   Musculoskeletal:  Negative for arthralgias, back pain and neck pain.  Skin:  Negative for itching and rash.  Neurological:  Positive for numbness. Negative for light-headedness.  Hematological:  Negative for adenopathy. Does not bruise/bleed easily.  Psychiatric/Behavioral:  Negative for confusion.      MEDICAL HISTORY:  Past Medical History:  Diagnosis Date   Cancer [facility])    Depression    Hypercalcemia 03/02/2020   Hyperlipemia    Hypertension    Hypogonadism in male    Multiple myeloma [facility])     SURGICAL HISTORY: Past Surgical History:  Procedure Laterality  Date   BONE MARROW BIOPSY     COLONOSCOPY  07/14/2008   ESOPHAGOGASTRODUODENOSCOPY N/A 10/22/2020   Procedure: ESOPHAGOGASTRODUODENOSCOPY (EGD);  Surgeon: Wyline Mood, MD;  [facility] END[facility]COPY;  Service: Gastroenterology;  Laterality: N/A;    SOCIAL HISTORY: Social History   Socioeconomic History   Marital status: Married    Spouse name: Cyndia Bent   Number of children: 3   Years of education: Not on file   Highest education level: Not on file  Occupational History   Not on file  Tobacco Use   Smoking status: Every Day    Current packs/day: 0.50    Average packs/day: 0.5 packs/day for 30.0 years (15.0 ttl pk-yrs)    Types: Cigarettes   Smokeless tobacco: Never  Substance and Sexual Activity   Alcohol use: Yes    Comment: occcassional   Drug use: No   Sexual activity: Not on file  Other Topics Concern   Not on file  Social History Narrative   Not on file   Social Drivers of Health   Financial Resource Strain: Low Risk  (06/23/2023)   Received from [facility]   Overall Financial Resource Strain (CARDIA)    Difficulty of Paying Living Expenses: Not hard at all  Food Insecurity: No Food Insecurity (06/23/2023)   Received from [facility]   Hunger Vital Sign    Worried About Running Out of Food in the Last Year: Never true    Ran Out of Food in the Last Year: Never true  Transportation Needs: No Transportation Needs (06/23/2023)   Received from [facility] - Transportation    In the past 12 months, has lack of transportation kept you from medical appointments or from getting medications?: No    Lack of Transportation (Non-Medical): No  Physical Activity: Not on file  Stress: Not on file  Social Connections: Not on file  Intimate Partner Violence: Not At Risk (11/10/2022)   Humiliation, Afraid, Rape, and Kick questionnaire    Fear of Current or Ex-Partner: No    Emotionally Abused: No    Physically  Abused: No    Sexually Abused: No    FAMILY HISTORY: Family History  Problem Relation Age of Onset   Skin cancer Mother    Prostate cancer Father     ALLERGIES:  has no known allergies.  MEDICATIONS:  Current Outpatient Medications  Medication Sig Dispense Refill   buPROPion (WELLBUTRIN XL) 300 MG 24 hr tablet Take 300 mg by mouth daily.     cholecalciferol (VITAMIN D3) 25 MCG (1000 UNIT) tablet Take 2,000 Units by mouth daily.     lenalidomide (REVLIMID) 5 MG capsule Take 1 capsule (5 mg total) by mouth daily. Take for 21 days, then hold for 7 days. Repeat every 28 days. 21 capsule 6   apixaban (ELIQUIS) 5 MG TABS tablet Take 1 tablet (5 mg total) by mouth 2 (two) times daily. 60 tablet 5   No current facility-administered medications for this visit.     PHYSICAL EXAMINATION: ECOG PERFORMANCE STATUS: 1 - Symptomatic but completely ambulatory Vitals:   07/03/23 0956  BP: (!) 141/92  Pulse: 65  Resp: 18  Temp: 97.7 F (  36.5 C)    Filed Weights   07/03/23 0956  Weight: 258 lb 9.6 oz (117.3 kg)     Physical Exam Constitutional:      General: He is not in acute distress.    Appearance: He is not diaphoretic.  HENT:     Head: Normocephalic and atraumatic.     Nose: Nose normal.     Mouth/Throat:     Pharynx: No oropharyngeal exudate.  Eyes:     General: No scleral icterus.    Pupils: Pupils are equal, round, and reactive to light.  Cardiovascular:     Rate and Rhythm: Normal rate.  Pulmonary:     Effort: Pulmonary effort is normal. No respiratory distress.     Breath sounds: No wheezing.  Abdominal:     General: There is no distension.     Palpations: Abdomen is soft.     Tenderness: There is no abdominal tenderness.  Musculoskeletal:        General: Normal range of motion.     Cervical back: Normal range of motion.     Left lower leg: Edema present.  Skin:    General: Skin is warm and dry.     Findings: No erythema.  Neurological:     Mental Status:  He is alert and oriented to person, place, and time. Mental status is at baseline.     Cranial Nerves: No cranial nerve deficit.     Motor: No abnormal muscle tone.  Psychiatric:        Mood and Affect: Mood and affect normal.       LABORATORY DATA:  I have reviewed the data as listed    Latest Ref Rng & Units 07/03/2023    9:05 AM 06/08/2023    1:13 PM 04/21/2023   10:41 AM  CBC  WBC 4.0 - 10.5 K/uL 3.0  3.8  3.5   Hemoglobin 13.0 - 17.0 g/dL 04.5  40.9  81.1   Hematocrit 39.0 - 52.0 % 41.7  42.9  41.2   Platelets 150 - 400 K/uL 125  129  170       Latest Ref Rng & Units 07/03/2023    9:05 AM 06/08/2023    1:15 PM 04/21/2023   10:41 AM  CMP  Glucose 70 - 99 mg/dL 914  95  93   BUN 8 - 23 mg/dL 12  12  10    Creatinine 0.61 - 1.24 mg/dL 7.82  9.56  2.13   Sodium 135 - 145 mmol/L 135  139  135   Potassium 3.5 - 5.1 mmol/L 4.0  4.0  4.2   Chloride 98 - 111 mmol/L 103  104  103   CO2 22 - 32 mmol/L 24  25  24    Calcium 8.9 - 10.3 mg/dL 8.9  9.0  8.8   Total Protein 6.5 - 8.1 g/dL 6.5  6.9  7.1   Total Bilirubin 0.0 - 1.2 mg/dL 0.9  1.3  1.5   Alkaline Phos 38 - 126 U/L 46  57  60   AST 15 - 41 U/L 17  15  27    ALT 0 - 44 U/L 15  16  17      RADIOGRAPHIC STUDIES: I have personally reviewed the radiological images as listed and agreed with the findings in the report. No results found.

## 2023-07-03 NOTE — Assessment & Plan Note (Signed)
Grade 1, stable symptoms.  Observation 

## 2023-07-03 NOTE — Assessment & Plan Note (Signed)
Continue anticoagulation with Eliquis 5mg  BID Patient declined repeat lower extremity ultrasound for follow-up of DVT

## 2023-07-03 NOTE — Telephone Encounter (Signed)
 Patient brought Revlimid bottle to cancer center and there were 5 pills remaining in bottle. Confirmed with patient that he already took his Revlimid this morning before bringing bottle in.   Appts have been moved to 4/4 per MD. Sherron Monday to pt and informed him of this appt date change.

## 2023-07-13 ENCOUNTER — Encounter: Payer: Self-pay | Admitting: Oncology

## 2023-07-13 NOTE — Telephone Encounter (Signed)
 FYI - financial assistance for Eliquis was denied.

## 2023-07-14 ENCOUNTER — Encounter: Payer: Self-pay | Admitting: Oncology

## 2023-07-14 ENCOUNTER — Inpatient Hospital Stay: Attending: Oncology

## 2023-07-14 ENCOUNTER — Inpatient Hospital Stay: Admitting: Oncology

## 2023-07-14 VITALS — BP 133/90 | HR 65 | Temp 97.2°F | Resp 18 | Wt 259.0 lb

## 2023-07-14 DIAGNOSIS — Z7961 Long term (current) use of immunomodulator: Secondary | ICD-10-CM | POA: Diagnosis not present

## 2023-07-14 DIAGNOSIS — R918 Other nonspecific abnormal finding of lung field: Secondary | ICD-10-CM | POA: Insufficient documentation

## 2023-07-14 DIAGNOSIS — Z7901 Long term (current) use of anticoagulants: Secondary | ICD-10-CM | POA: Insufficient documentation

## 2023-07-14 DIAGNOSIS — I82513 Chronic embolism and thrombosis of femoral vein, bilateral: Secondary | ICD-10-CM | POA: Diagnosis not present

## 2023-07-14 DIAGNOSIS — I10 Essential (primary) hypertension: Secondary | ICD-10-CM | POA: Insufficient documentation

## 2023-07-14 DIAGNOSIS — E785 Hyperlipidemia, unspecified: Secondary | ICD-10-CM | POA: Insufficient documentation

## 2023-07-14 DIAGNOSIS — I700 Atherosclerosis of aorta: Secondary | ICD-10-CM | POA: Diagnosis not present

## 2023-07-14 DIAGNOSIS — T451X5A Adverse effect of antineoplastic and immunosuppressive drugs, initial encounter: Secondary | ICD-10-CM

## 2023-07-14 DIAGNOSIS — Z5111 Encounter for antineoplastic chemotherapy: Secondary | ICD-10-CM

## 2023-07-14 DIAGNOSIS — C9000 Multiple myeloma not having achieved remission: Secondary | ICD-10-CM | POA: Diagnosis present

## 2023-07-14 DIAGNOSIS — M79651 Pain in right thigh: Secondary | ICD-10-CM | POA: Diagnosis not present

## 2023-07-14 DIAGNOSIS — G620 Drug-induced polyneuropathy: Secondary | ICD-10-CM | POA: Insufficient documentation

## 2023-07-14 DIAGNOSIS — I825Y1 Chronic embolism and thrombosis of unspecified deep veins of right proximal lower extremity: Secondary | ICD-10-CM

## 2023-07-14 DIAGNOSIS — Z9481 Bone marrow transplant status: Secondary | ICD-10-CM | POA: Diagnosis not present

## 2023-07-14 DIAGNOSIS — F17210 Nicotine dependence, cigarettes, uncomplicated: Secondary | ICD-10-CM | POA: Insufficient documentation

## 2023-07-14 LAB — CMP (CANCER CENTER ONLY)
ALT: 16 U/L (ref 0–44)
AST: 17 U/L (ref 15–41)
Albumin: 4 g/dL (ref 3.5–5.0)
Alkaline Phosphatase: 53 U/L (ref 38–126)
Anion gap: 6 (ref 5–15)
BUN: 11 mg/dL (ref 8–23)
CO2: 24 mmol/L (ref 22–32)
Calcium: 8.6 mg/dL — ABNORMAL LOW (ref 8.9–10.3)
Chloride: 108 mmol/L (ref 98–111)
Creatinine: 1.09 mg/dL (ref 0.61–1.24)
GFR, Estimated: >60 mL/min (ref >60)
Glucose, Bld: 90 mg/dL (ref 70–99)
Potassium: 4.2 mmol/L (ref 3.5–5.1)
Sodium: 138 mmol/L (ref 135–145)
Total Bilirubin: 1.1 mg/dL (ref 0.0–1.2)
Total Protein: 6.8 g/dL (ref 6.5–8.1)

## 2023-07-14 LAB — CBC WITH DIFFERENTIAL (CANCER CENTER ONLY)
Abs Immature Granulocytes: 0.02 10*3/uL (ref 0.00–0.07)
Basophils Absolute: 0 10*3/uL (ref 0.0–0.1)
Basophils Relative: 1 %
Eosinophils Absolute: 0.5 10*3/uL (ref 0.0–0.5)
Eosinophils Relative: 12 %
HCT: 43.1 % (ref 39.0–52.0)
Hemoglobin: 14 g/dL (ref 13.0–17.0)
Immature Granulocytes: 1 %
Lymphocytes Relative: 35 %
Lymphs Abs: 1.6 10*3/uL (ref 0.7–4.0)
MCH: 29.9 pg (ref 26.0–34.0)
MCHC: 32.5 g/dL (ref 30.0–36.0)
MCV: 92.1 fL (ref 80.0–100.0)
Monocytes Absolute: 0.4 10*3/uL (ref 0.1–1.0)
Monocytes Relative: 10 %
Neutro Abs: 1.9 10*3/uL (ref 1.7–7.7)
Neutrophils Relative %: 41 %
Platelet Count: 141 10*3/uL — ABNORMAL LOW (ref 150–400)
RBC: 4.68 MIL/uL (ref 4.22–5.81)
RDW: 16.5 % — ABNORMAL HIGH (ref 11.5–15.5)
WBC Count: 4.4 10*3/uL (ref 4.0–10.5)
nRBC: 0 % (ref 0.0–0.2)

## 2023-07-14 MED ORDER — RIVAROXABAN 20 MG PO TABS: 20.0000 mg | ORAL_TABLET | Freq: Every day | ORAL | 5 refills | Status: AC

## 2023-07-14 NOTE — Assessment & Plan Note (Addendum)
#  IgG multiple myeloma, stage II, del 1 p, high risk.  Declined bone marrow transplant evaluation. 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 Patient is on maintenance Revlimid  5mg  3 weeks on 1 week off [ dose reduced due to neutropenia]  Labs are reviewed and discussed with patient. Proceed next cycle of Revlimid- 07/15/2023

## 2023-07-14 NOTE — Assessment & Plan Note (Signed)
Grade 1, stable symptoms.  Observation 

## 2023-07-14 NOTE — Progress Notes (Signed)
 Hematology/Oncology Progress note Telephone:([phone] Fax:([phone]        CHIEF COMPLAINTS/REASON FOR VISIT:  Follow-up for multiple myeloma  ASSESSMENT & PLAN:   Cancer Staging  Multiple myeloma not having achieved remission (HCC) Staging form: Plasma Cell Myeloma and Plasma Cell Disorders, AJCC 8th Edition - Clinical stage from 02/04/2020: Beta-2-microglobulin (mg/L): 2.1, Albumin (g/dL): 2.7, ISS: Stage II, High-risk cytogenetics: Absent, LDH: Unknown - Signed by Rickard Patience, MD on 05/06/2020   Multiple myeloma not having achieved remission (HCC) #IgG multiple myeloma, stage II, del 1 p, high risk.  Declined bone marrow transplant evaluation. 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 Patient is on maintenance Revlimid  5mg  3 weeks on 1 week off [ dose reduced due to neutropenia]  Labs are reviewed and discussed with patient. Proceed next cycle of Revlimid- 07/15/2023    Hypocalcemia Recommend patient to take calcium 600mg  daily and Vitamin D 1000 units.   Chemotherapy-induced neuropathy (HCC) Grade 1, stable symptoms.  Observation  Chronic deep vein thrombosis (DVT) (HCC) He has been on anticoagulation with Eliquis 5mg  BID- he has a high copay and did not qualify for assistance.  Switch to Xarelto. 20mg  daily Patient declined repeat lower extremity ultrasound for follow-up of DVT   Encounter for antineoplastic chemotherapy Treatment as planned.     Orders Placed This Encounter  Procedures   CBC with Differential (Cancer Center Only)    Standing Status:   Future    Expected Date:   08/11/2023    Expiration Date:   07/13/2024   CMP (Cancer Center only)    Standing Status:    Future    Expected Date:   08/11/2023    Expiration Date:   07/13/2024   Multiple Myeloma Panel (SPEP&IFE w/QIG)    Standing Status:   Future    Expected Date:   08/11/2023    Expiration Date:   07/13/2024   Kappa/lambda light chains    Standing Status:   Future    Expected Date:   08/11/2023    Expiration Date:   07/13/2024   Follow up in 4 weeks   All questions were answered. The patient knows to call the clinic with any problems, questions or concerns.  Rickard Patience, MD, PhD [facility] Hematology Oncology 07/14/2023     HISTORY OF PRESENTING ILLNESS:   Tommy Smith. is a  68 y.o.  male presents for follow-up of management of multiple myeloma Oncology history summary listed as below  Oncology History  Multiple myeloma not having achieved remission (HCC)  01/29/2020 Imaging   CT showed multiple bilateral pulmonary nodules measuring up to 10 mm in the right middle lobe, nodules are indeterminate, infectious/inflammatory etiology versus metastatic disease. Multiple osseous lytic lesions, largest lesion involving the left pubic bone concerning for metastatic disease.Compression fractures at T9, age indeterminate. Appearance focal area of thickening at the gastroesophageal junction.  Further evaluation with upper GI study or direct visualization with ndoscopy is recommended No bowel obstruction.Aortic atherosclerosis.   02/04/2020 Cancer Staging   Staging form: Plasma Cell Myeloma and Plasma Cell Disorders, AJCC 8th Edition - Clinical stage from 02/04/2020: Beta-2-microglobulin (mg/L): 2.1, Albumin (g/dL): 2.7, ISS: Stage II, High-risk cytogenetics: Absent, LDH: Unknown - Signed by Rickard Patience, MD on  05/06/2020   02/21/2020 Initial Diagnosis   Multiple myeloma  Baseline M protein 4.1, kappa light chain level 333.8 Beta-2 microglobulin 2.1, albumin 2.7.  -02/12/2020, bone marrow biopsy showed hypercellular for age, 80% plasma cell which are complex restricted by light chain in situ  heparinization.  Background hematopoiesis is present but reduced. Cytogenetics is normal, Myeloma FISH panel-deletion 1P,Standard risk.   02/27/2020 - 05/06/2021 Chemotherapy   VRd     09/01/2020 Bone Marrow Biopsy   Bone marrow results were reviewed.  1% plasma cell.  Normal cytogenetics.  Myeloma FISH negative for 1p del.   04/21/2021 Bone Marrow Biopsy   Bone marrow after 14 cycles of VRD -[January 2023 ]showed plasma cells 2%.-   06/25/2021 Bone Marrow Transplant   Patient underwent autologous [facility] marrow [facility]   09/2021 - 11/10/2022 Chemotherapy   Revlimid 10 mg 3 weeks on 1 week off   10/26/2021 Imaging   Bilateral lower extremity venous US Partially occlusive right deep vein thrombus extending from the calf veins into the common femoral vein with limited examination of the left extremity demonstrating partially occlusive thrombus in the common femoral vein.     01/13/2022 Imaging   Bilateral lower extremity venous US 1. 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 2. Extensive predominantly occlusive/near occlusive left lower extremity DVT extending from the left common femoral vein through the imaged tibial veins, age-indeterminate in the absence of prior examinations though echogenic thrombus within the left femoral vein suggests a chronic etiology.     01/20/2022 Bone Marrow Biopsy   -Bone marrow biopsy at [facility] showed Cellular bone marrow aspirate with trilineage hematopoiesis and no morphologic evidence of plasma cell neoplasm.  -Negative minimal residual disease.   12/03/2022 -  Chemotherapy   Revlimid 5mg  3 weeks on, 1 week off.     #Focal area of thickening at the GE junction. 10/23/2020 EGD is normal.    #Lung nodules, measures up to 10 mm on the right middle lobe.  Indeterminate.  Attention on follow-up.  Recommend repeat CT chest and he declined  10/28/2011, unilateral right lower extremity ultrasound showed partially occlusive right deep vein thrombosis extending from the calf vein into the, femoral vein. Patient was recommended to start on Eliquis for anticoagulation.  He tolerates well.  Right thigh pain has improved.  INTERVAL HISTORY Bruce Churilla. is a 68 y.o. male who has above history reviewed by me today presents for follow up visit for multiple myeloma Current everyday smoker, 10 cigarettes daily.   Patient denies fever chills.  No new complaints.  Review of Systems  Constitutional:  Negative for appetite change, chills, fatigue, fever and unexpected weight change.  HENT:   Negative for hearing loss and voice change.   Eyes:  Negative for eye problems and icterus.  Respiratory:  Negative for chest tightness, cough and shortness of breath.   Cardiovascular:  Negative for chest pain and leg swelling.  Gastrointestinal:  Negative for abdominal distention, abdominal pain and constipation.  Endocrine: Negative for hot flashes.  Genitourinary:  Negative for difficulty urinating, dysuria and frequency.   Musculoskeletal:  Negative for arthralgias, back pain and neck pain.  Skin:  Negative for itching and rash.  Neurological:  Positive for numbness. Negative for light-headedness.  Hematological:  Negative for adenopathy. Does  not bruise/bleed easily.  Psychiatric/Behavioral:  Negative for confusion.      MEDICAL HISTORY:  Past Medical History:  Diagnosis Date   Cancer (HCC)    Depression    Hypercalcemia 03/02/2020   Hyperlipemia    Hypertension    Hypogonadism in male    Multiple myeloma [facility])     SURGICAL HISTORY: Past Surgical History:  Procedure Laterality Date   BONE MARROW BIOPSY     COLONOSCOPY  07/14/2008   ESOPHAGOGASTRODUODENOSCOPY N/A 10/22/2020    Procedure: ESOPHAGOGASTRODUODENOSCOPY (EGD);  Surgeon: Wyline Mood, MD;  Location: [facility] ENDOSCOPY;  Service: Gastroenterology;  Laterality: N/A;    SOCIAL HISTORY: Social History   Socioeconomic History   Marital status: Married    Spouse name: Cyndia Bent   Number of children: 3   Years of education: Not on file   Highest education level: Not on file  Occupational History   Not on file  Tobacco Use   Smoking status: Every Day    Current packs/day: 0.50    Average packs/day: 0.5 packs/day for 30.0 years (15.0 ttl pk-yrs)    Types: Cigarettes   Smokeless tobacco: Never  Substance and Sexual Activity   Alcohol use: Yes    Comment: occcassional   Drug use: No   Sexual activity: Not on file  Other Topics Concern   Not on file  Social History Narrative   Not on file   Social Drivers of Health   Financial Resource Strain: Low Risk  (06/23/2023)   Received from [facility]   Overall Financial Resource Strain (CARDIA)    Difficulty of Paying Living Expenses: Not hard at all  Food Insecurity: No Food Insecurity (06/23/2023)   Received from [facility]   Hunger Vital Sign    Worried About Running Out of Food in the Last Year: Never true    Ran Out of Food in the Last Year: Never true  Transportation Needs: No Transportation Needs (06/23/2023)   Received from [facility] - Transportation    In the past 12 months, has lack of transportation kept you from medical appointments or from getting medications?: No    Lack of Transportation (Non-Medical): No  Physical Activity: Not on file  Stress: Not on file  Social Connections: Not on file  Intimate Partner Violence: Not At Risk (11/10/2022)   Humiliation, Afraid, Rape, and Kick questionnaire    Fear of Current or Ex-Partner: No    Emotionally Abused: No    Physically Abused: No    Sexually Abused: No    FAMILY HISTORY: Family History  Problem Relation Age of Onset    Skin cancer Mother    Prostate cancer Father     ALLERGIES:  has no known allergies.  MEDICATIONS:  Current Outpatient Medications  Medication Sig Dispense Refill   buPROPion (WELLBUTRIN XL) 300 MG 24 hr tablet Take 300 mg by mouth daily.     cholecalciferol (VITAMIN D3) 25 MCG (1000 UNIT) tablet Take 2,000 Units by mouth daily.     lenalidomide (REVLIMID) 5 MG capsule Take 1 capsule (5 mg total) by mouth daily. Take for 21 days, then hold for 7 days. Repeat every 28 days. 21 capsule 6   rivaroxaban (XARELTO) 20 MG TABS tablet Take 1 tablet (20 mg total) by mouth daily with supper. 30 tablet 5   No current facility-administered medications for this visit.     PHYSICAL EXAMINATION: ECOG PERFORMANCE STATUS: 1 - Symptomatic but completely  ambulatory Vitals:   07/14/23 1116  BP: (!) 133/90  Pulse: 65  Resp: 18  Temp: (!) 97.2 F (36.2 C)  SpO2: 100%    Filed Weights   07/14/23 1116  Weight: 259 lb (117.5 kg)     Physical Exam Constitutional:      General: He is not in acute distress.    Appearance: He is not diaphoretic.  HENT:     Head: Normocephalic and atraumatic.     Nose: Nose normal.     Mouth/Throat:     Pharynx: No oropharyngeal exudate.  Eyes:     General: No scleral icterus.    Pupils: Pupils are equal, round, and reactive to light.  Cardiovascular:     Rate and Rhythm: Normal rate.  Pulmonary:     Effort: Pulmonary effort is normal. No respiratory distress.     Breath sounds: No wheezing.  Abdominal:     General: There is no distension.     Palpations: Abdomen is soft.     Tenderness: There is no abdominal tenderness.  Musculoskeletal:        General: Normal range of motion.     Cervical back: Normal range of motion.     Left lower leg: Edema present.  Skin:    General: Skin is warm and dry.     Findings: No erythema.  Neurological:     Mental Status: He is alert and oriented to person, place, and time. Mental status is at baseline.     Cranial  Nerves: No cranial nerve deficit.     Motor: No abnormal muscle tone.  Psychiatric:        Mood and Affect: Mood and affect normal.       LABORATORY DATA:  I have reviewed the data as listed    Latest Ref Rng & Units 07/14/2023   11:05 AM 07/03/2023    9:05 AM 06/08/2023    1:13 PM  CBC  WBC 4.0 - 10.5 K/uL 4.4  3.0  3.8   Hemoglobin 13.0 - 17.0 g/dL 16.1  09.6  04.5   Hematocrit 39.0 - 52.0 % 43.1  41.7  42.9   Platelets 150 - 400 K/uL 141  125  129       Latest Ref Rng & Units 07/14/2023   11:05 AM 07/03/2023    9:05 AM 06/08/2023    1:15 PM  CMP  Glucose 70 - 99 mg/dL 90  409  95   BUN 8 - 23 mg/dL 11  12  12    Creatinine 0.61 - 1.24 mg/dL 8.11  9.14  7.82   Sodium 135 - 145 mmol/L 138  135  139   Potassium 3.5 - 5.1 mmol/L 4.2  4.0  4.0   Chloride 98 - 111 mmol/L 108  103  104   CO2 22 - 32 mmol/L 24  24  25    Calcium 8.9 - 10.3 mg/dL 8.6  8.9  9.0   Total Protein 6.5 - 8.1 g/dL 6.8  6.5  6.9   Total Bilirubin 0.0 - 1.2 mg/dL 1.1  0.9  1.3   Alkaline Phos 38 - 126 U/L 53  46  57   AST 15 - 41 U/L 17  17  15    ALT 0 - 44 U/L 16  15  16      RADIOGRAPHIC STUDIES: I have personally reviewed the radiological images as listed and agreed with the findings in the report. No results found.

## 2023-07-14 NOTE — Assessment & Plan Note (Signed)
 He has been on anticoagulation with Eliquis 5mg  BID- he has a high copay and did not qualify for assistance.  Switch to Xarelto. 20mg  daily Patient declined repeat lower extremity ultrasound for follow-up of DVT

## 2023-07-14 NOTE — Assessment & Plan Note (Signed)
Treatment as planned 

## 2023-07-14 NOTE — Assessment & Plan Note (Signed)
 Recommend patient to take calcium 600mg  daily and Vitamin D 1000 units.

## 2023-07-17 LAB — KAPPA/LAMBDA LIGHT CHAINS
Kappa free light chain: 16.2 mg/L (ref 3.3–19.4)
Kappa, lambda light chain ratio: 1.2 (ref 0.26–1.65)
Lambda free light chains: 13.5 mg/L (ref 5.7–26.3)

## 2023-07-18 LAB — MULTIPLE MYELOMA PANEL, SERUM
Albumin SerPl Elph-Mcnc: 3.6 g/dL (ref 2.9–4.4)
Albumin/Glob SerPl: 1.3 (ref 0.7–1.7)
Alpha 1: 0.2 g/dL (ref 0.0–0.4)
Alpha2 Glob SerPl Elph-Mcnc: 0.7 g/dL (ref 0.4–1.0)
B-Globulin SerPl Elph-Mcnc: 1.1 g/dL (ref 0.7–1.3)
Gamma Glob SerPl Elph-Mcnc: 0.7 g/dL (ref 0.4–1.8)
Globulin, Total: 2.8 g/dL (ref 2.2–3.9)
IgA: 298 mg/dL (ref 61–437)
IgG (Immunoglobin G), Serum: 778 mg/dL (ref 603–1613)
IgM (Immunoglobulin M), Srm: 11 mg/dL — ABNORMAL LOW (ref 20–172)
Total Protein ELP: 6.4 g/dL (ref 6.0–8.5)

## 2023-07-21 ENCOUNTER — Ambulatory Visit: Payer: Medicare Other | Admitting: Oncology

## 2023-07-21 ENCOUNTER — Other Ambulatory Visit: Payer: Medicare Other

## 2023-07-24 ENCOUNTER — Other Ambulatory Visit: Payer: Self-pay

## 2023-07-24 ENCOUNTER — Telehealth: Payer: Self-pay | Admitting: *Deleted

## 2023-07-24 DIAGNOSIS — C9000 Multiple myeloma not having achieved remission: Secondary | ICD-10-CM

## 2023-07-24 MED ORDER — LENALIDOMIDE 5 MG PO CAPS: 5.0000 mg | ORAL_CAPSULE | Freq: Every day | ORAL | 6 refills | Status: DC

## 2023-07-25 ENCOUNTER — Telehealth: Payer: Self-pay | Admitting: *Deleted

## 2023-07-25 NOTE — Telephone Encounter (Signed)
 Called pt a few times and call wouldn't go through. Finally went through and left a detailed VM. Mychart message sent as well.

## 2023-07-25 NOTE — Telephone Encounter (Signed)
 Per the patient started on 4/5 the day with revlimid. He is on 21 days and he says that the they are asking him to send another rx of it. He states that he has 11 more pills and then 7 days off. He states that when he needs it it is sent next day. It gets him off with the meds coming so soon. Want can he do about this or if he  needs education

## 2023-08-15 ENCOUNTER — Encounter: Payer: Self-pay | Admitting: Oncology

## 2023-08-15 ENCOUNTER — Inpatient Hospital Stay (HOSPITAL_BASED_OUTPATIENT_CLINIC_OR_DEPARTMENT_OTHER): Admitting: Oncology

## 2023-08-15 ENCOUNTER — Inpatient Hospital Stay: Attending: Oncology

## 2023-08-15 VITALS — BP 145/92 | HR 58 | Temp 96.0°F | Resp 15 | Wt 259.0 lb

## 2023-08-15 DIAGNOSIS — Z5111 Encounter for antineoplastic chemotherapy: Secondary | ICD-10-CM

## 2023-08-15 DIAGNOSIS — I825Y1 Chronic embolism and thrombosis of unspecified deep veins of right proximal lower extremity: Secondary | ICD-10-CM | POA: Diagnosis not present

## 2023-08-15 DIAGNOSIS — Z7901 Long term (current) use of anticoagulants: Secondary | ICD-10-CM | POA: Diagnosis not present

## 2023-08-15 DIAGNOSIS — R918 Other nonspecific abnormal finding of lung field: Secondary | ICD-10-CM | POA: Diagnosis not present

## 2023-08-15 DIAGNOSIS — C9000 Multiple myeloma not having achieved remission: Secondary | ICD-10-CM

## 2023-08-15 DIAGNOSIS — T451X5A Adverse effect of antineoplastic and immunosuppressive drugs, initial encounter: Secondary | ICD-10-CM

## 2023-08-15 DIAGNOSIS — F17210 Nicotine dependence, cigarettes, uncomplicated: Secondary | ICD-10-CM | POA: Insufficient documentation

## 2023-08-15 DIAGNOSIS — Z9481 Bone marrow transplant status: Secondary | ICD-10-CM

## 2023-08-15 DIAGNOSIS — D702 Other drug-induced agranulocytosis: Secondary | ICD-10-CM

## 2023-08-15 DIAGNOSIS — G620 Drug-induced polyneuropathy: Secondary | ICD-10-CM | POA: Diagnosis not present

## 2023-08-15 DIAGNOSIS — I82513 Chronic embolism and thrombosis of femoral vein, bilateral: Secondary | ICD-10-CM | POA: Diagnosis not present

## 2023-08-15 DIAGNOSIS — D709 Neutropenia, unspecified: Secondary | ICD-10-CM | POA: Insufficient documentation

## 2023-08-15 DIAGNOSIS — M79651 Pain in right thigh: Secondary | ICD-10-CM | POA: Insufficient documentation

## 2023-08-15 LAB — CMP (CANCER CENTER ONLY)
ALT: 20 U/L (ref 0–44)
AST: 18 U/L (ref 15–41)
Albumin: 4.2 g/dL (ref 3.5–5.0)
Alkaline Phosphatase: 46 U/L (ref 38–126)
Anion gap: 9 (ref 5–15)
BUN: 15 mg/dL (ref 8–23)
CO2: 23 mmol/L (ref 22–32)
Calcium: 9.1 mg/dL (ref 8.9–10.3)
Chloride: 105 mmol/L (ref 98–111)
Creatinine: 1.01 mg/dL (ref 0.61–1.24)
GFR, Estimated: >60 mL/min (ref >60)
Glucose, Bld: 93 mg/dL (ref 70–99)
Potassium: 3.9 mmol/L (ref 3.5–5.1)
Sodium: 137 mmol/L (ref 135–145)
Total Bilirubin: 1.1 mg/dL (ref 0.0–1.2)
Total Protein: 7.1 g/dL (ref 6.5–8.1)

## 2023-08-15 LAB — CBC WITH DIFFERENTIAL (CANCER CENTER ONLY)
Abs Immature Granulocytes: 0 10*3/uL (ref 0.00–0.07)
Basophils Absolute: 0 10*3/uL (ref 0.0–0.1)
Basophils Relative: 1 %
Eosinophils Absolute: 0.2 10*3/uL (ref 0.0–0.5)
Eosinophils Relative: 8 %
HCT: 45.4 % (ref 39.0–52.0)
Hemoglobin: 14.8 g/dL (ref 13.0–17.0)
Immature Granulocytes: 0 %
Lymphocytes Relative: 47 %
Lymphs Abs: 1.4 10*3/uL (ref 0.7–4.0)
MCH: 29.8 pg (ref 26.0–34.0)
MCHC: 32.6 g/dL (ref 30.0–36.0)
MCV: 91.3 fL (ref 80.0–100.0)
Monocytes Absolute: 0.3 10*3/uL (ref 0.1–1.0)
Monocytes Relative: 11 %
Neutro Abs: 1 10*3/uL — ABNORMAL LOW (ref 1.7–7.7)
Neutrophils Relative %: 33 %
Platelet Count: 144 10*3/uL — ABNORMAL LOW (ref 150–400)
RBC: 4.97 MIL/uL (ref 4.22–5.81)
RDW: 16.3 % — ABNORMAL HIGH (ref 11.5–15.5)
WBC Count: 3 10*3/uL — ABNORMAL LOW (ref 4.0–10.5)
nRBC: 0 % (ref 0.0–0.2)

## 2023-08-15 NOTE — Assessment & Plan Note (Signed)
Stable ANC. monitor

## 2023-08-15 NOTE — Assessment & Plan Note (Addendum)
#  IgG multiple myeloma, stage II, del 1 p, high risk.  Declined bone marrow transplant evaluation. 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 Patient is on maintenance Revlimid   5mg  3 weeks on 1 week off [ dose reduced due to neutropenia]  Labs are reviewed and discussed with patient. Proceed next cycle of Revlimid - 08/22/2023 - delay 1 week due to mild neutropenia ANC 1

## 2023-08-15 NOTE — Progress Notes (Signed)
 Hematology/Oncology Progress note [phone] [phone]        CHIEF COMPLAINTS/REASON FOR VISIT:  Follow-up for multiple myeloma  ASSESSMENT & PLAN:   Cancer Staging  Multiple myeloma not having achieved remission (HCC) Staging form: Plasma Cell Myeloma and Plasma Cell Disorders, AJCC 8th Edition - Clinical stage from 02/04/2020: Beta-2 -microglobulin (mg/L): 2.1, Albumin (g/dL): 2.7, ISS: Stage II, High-risk cytogenetics: Absent, LDH: Unknown - Signed by [name], MD on 05/06/2020   Multiple myeloma not having achieved remission (HCC) #IgG multiple myeloma, stage II, del 1 p, high risk.  Declined bone marrow transplant evaluation. 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 Patient is on maintenance Revlimid   5mg  3 weeks on 1 week off [ dose reduced due to neutropenia]  Labs are reviewed and discussed with patient. Proceed next cycle of Revlimid - 08/22/2023 - delay 1 week due to mild neutropenia ANC 1    Chemotherapy-induced neuropathy (HCC) Grade 1, stable symptoms.  Observation  Chronic deep vein thrombosis (DVT) (HCC) Tommy Smith has been on anticoagulation with Eliquis  5mg  BID- Tommy Smith has a high copay and did not qualify for assistance.  Continue Xarelto . 20mg  daily Patient declined repeat lower extremity ultrasound for follow-up of DVT   Drug-induced neutropenia (HCC) Stable ANC. monitor   Encounter for antineoplastic chemotherapy Treatment as planned.  History of bone marrow transplant [facility]) Patient follows up with oncology for post marrow transplant vaccination. Off acyclovir  400 mg twice daily   Hypocalcemia Recommend patient to take calcium  600mg  daily and Vitamin D  1000 units.      Orders Placed This Encounter  Procedures   Kappa/lambda light chains    Standing Status:   Future    Expected Date:   09/19/2023    Expiration Date:   08/14/2024   Multiple Myeloma Panel (SPEP&IFE w/QIG)    Standing Status:   Future    Expected Date:   09/19/2023    Expiration Date:   08/14/2024   CMP (Cancer Center only)    Standing Status:   Future    Expected Date:   09/19/2023    Expiration Date:   08/14/2024   CBC with Differential (Cancer Center Only)    Standing Status:   Future    Expected Date:   09/19/2023    Expiration Date:   08/14/2024   Follow up in 5 weeks   All questions were answered. The patient knows to call the clinic with any problems, questions or concerns.  [name], MD, PhD [facility] Hematology Oncology 08/15/2023     HISTORY OF PRESENTING ILLNESS:   Tommy Quigg. is a  68 y.o.  male presents for follow-up of management of multiple myeloma Oncology history summary listed as below  Oncology History  Multiple myeloma not having achieved remission (HCC)  01/29/2020 Imaging   CT showed multiple bilateral pulmonary nodules measuring up to 10 mm in the right middle lobe, nodules are indeterminate, infectious/inflammatory etiology versus metastatic disease. Multiple osseous lytic lesions, largest lesion involving the left pubic bone concerning for metastatic disease.Compression fractures at T9, age indeterminate. Appearance focal area of thickening at the gastroesophageal junction.  Further evaluation with upper GI study or direct visualization with ndoscopy is recommended No bowel obstruction.Aortic atherosclerosis.   02/04/2020 Cancer  Staging   Staging form: Plasma Cell Myeloma and Plasma Cell Disorders, AJCC 8th Edition - Clinical stage from 02/04/2020: Beta-2 -microglobulin (mg/L): 2.1, Albumin (g/dL): 2.7, ISS: Stage II, High-risk cytogenetics: Absent, LDH: Unknown - Signed by [name], MD on 05/06/2020   02/21/2020 Initial Diagnosis    Multiple myeloma  Baseline M protein 4.1, kappa light chain level 333.8 Beta-2  microglobulin 2.1, albumin 2.7.  -02/12/2020, bone marrow biopsy showed hypercellular for age, 80% plasma cell which are complex restricted by light chain in situ heparinization.  Background hematopoiesis is present but reduced. Cytogenetics is normal, Myeloma FISH panel-deletion 1P,Standard risk.   02/27/2020 - 05/06/2021 Chemotherapy   VRd     09/01/2020 Bone Marrow Biopsy   Bone marrow results were reviewed.  1% plasma cell.  Normal cytogenetics.  Myeloma FISH negative for 1p del.   04/21/2021 Bone Marrow Biopsy   Bone marrow after 14 cycles of VRD -[January 2023 ]showed plasma cells 2%.-   06/25/2021 Bone Marrow Transplant   Patient underwent autologous bone marrow transplant at [facility]   09/2021 - 11/10/2022 Chemotherapy   Revlimid  10 mg 3 weeks on 1 week off   10/26/2021 Imaging   Bilateral lower extremity venous US  Partially occlusive right deep vein thrombus extending from the calf veins into the common femoral vein with limited examination of the left extremity demonstrating partially occlusive thrombus in the common femoral vein.     01/13/2022 Imaging   Bilateral lower extremity venous US  1. 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 2. Extensive predominantly occlusive/near occlusive left lower extremity DVT extending from the left common femoral vein through the imaged tibial veins, age-indeterminate in the absence of prior examinations though echogenic thrombus within the left femoral vein suggests a chronic etiology.     01/20/2022 Bone Marrow Biopsy   -Bone marrow biopsy at [facility] showed Cellular bone marrow aspirate with trilineage  hematopoiesis and no morphologic evidence of plasma cell neoplasm.  -Negative minimal residual disease.   12/03/2022 -  Chemotherapy   Revlimid  5mg  3 weeks on, 1 week off.     #Focal area of thickening at the GE junction. 10/23/2020 EGD is normal.   #Lung nodules, measures up to 10 mm on the right middle lobe.  Indeterminate.  Attention on follow-up.  Recommend repeat CT chest and Tommy Smith declined  10/28/2011, unilateral right lower extremity ultrasound showed partially occlusive right deep vein thrombosis extending from the calf vein into the, femoral vein. Patient was recommended to start on Eliquis  for anticoagulation.  Tommy Smith tolerates well.  Right thigh pain has improved.  INTERVAL HISTORY Tommy Schulze. is a 68 y.o. male who has above history reviewed by me today presents for follow up visit for multiple myeloma Current everyday smoker, 10 cigarettes daily.   Patient denies fever chills.  No new complaints.  Review of Systems  Constitutional:  Negative for appetite change, chills, fatigue, fever and unexpected weight change.  HENT:   Negative for hearing loss and voice change.   Eyes:  Negative for eye problems and icterus.  Respiratory:  Negative for chest tightness, cough and shortness of breath.   Cardiovascular:  Negative for chest pain and leg swelling.  Gastrointestinal:  Negative for abdominal distention, abdominal pain and constipation.  Endocrine: Negative for hot flashes.  Genitourinary:  Negative for  difficulty urinating, dysuria and frequency.   Musculoskeletal:  Negative for arthralgias, back pain and neck pain.  Skin:  Negative for itching and rash.  Neurological:  Positive for numbness. Negative for light-headedness.  Hematological:  Negative for adenopathy. Does not bruise/bleed easily.  Psychiatric/Behavioral:  Negative for confusion.      MEDICAL HISTORY:  Past Medical History:  Diagnosis Date   Cancer (HCC)    Depression    Hypercalcemia 03/02/2020    Hyperlipemia    Hypertension    Hypogonadism in male    Multiple myeloma [facility])     SURGICAL HISTORY: Past Surgical History:  Procedure Laterality Date   BONE MARROW BIOPSY     COLONOSCOPY  07/14/2008   ESOPHAGOGASTRODUODENOSCOPY N/A 10/22/2020   Procedure: ESOPHAGOGASTRODUODENOSCOPY (EGD);  Surgeon: Luke Salaam, MD;  Location: [facility] ENDOSCOPY;  Service: Gastroenterology;  Laterality: N/A;    SOCIAL HISTORY: Social History   Socioeconomic History   Marital status: Married    Spouse name: Catha Clink   Number of children: 3   Years of education: Not on file   Highest education level: Not on file  Occupational History   Not on file  Tobacco Use   Smoking status: Every Day    Current packs/day: 0.50    Average packs/day: 0.5 packs/day for 30.0 years (15.0 ttl pk-yrs)    Types: Cigarettes   Smokeless tobacco: Never  Substance and Sexual Activity   Alcohol use: Yes    Comment: occcassional   Drug use: No   Sexual activity: Not on file  Other Topics Concern   Not on file  Social History Narrative   Not on file   Social Drivers of Health   Financial Resource Strain: Low Risk  (06/23/2023)   Received from [facility]   Overall Financial Resource Strain (CARDIA)    Difficulty of Paying Living Expenses: Not hard at all  Food Insecurity: No Food Insecurity (06/23/2023)   Received from [facility]   Hunger Vital Sign    Worried About Running Out of Food in the Last Year: Never true    Ran Out of Food in the Last Year: Never true  Transportation Needs: No Transportation Needs (06/23/2023)   Received from [facility] - Transportation    In the past 12 months, has lack of transportation kept you from medical appointments or from getting medications?: No    Lack of Transportation (Non-Medical): No  Physical Activity: Not on file  Stress: Not on file  Social Connections: Not on file  Intimate Partner Violence: Not At  Risk (11/10/2022)   Humiliation, Afraid, Rape, and Kick questionnaire    Fear of Current or Ex-Partner: No    Emotionally Abused: No    Physically Abused: No    Sexually Abused: No    FAMILY HISTORY: Family History  Problem Relation Age of Onset   Skin cancer Mother    Prostate cancer Father     ALLERGIES:  has no known allergies.  MEDICATIONS:  Current Outpatient Medications  Medication Sig Dispense Refill   buPROPion  (WELLBUTRIN  XL) 300 MG 24 hr tablet Take 300 mg by mouth daily.     cholecalciferol (VITAMIN D3) 25 MCG (1000 UNIT) tablet Take 2,000 Units by mouth daily.     lenalidomide  (REVLIMID ) 5 MG capsule Take 1 capsule (5 mg total) by mouth daily. Take for 21 days, then hold for 7 days. Repeat every 28 days. 21 capsule 6   rivaroxaban  (XARELTO )  20 MG TABS tablet Take 1 tablet (20 mg total) by mouth daily with supper. 30 tablet 5   No current facility-administered medications for this visit.     PHYSICAL EXAMINATION: ECOG PERFORMANCE STATUS: 1 - Symptomatic but completely ambulatory Vitals:   08/15/23 1032  BP: (!) 145/92  Pulse: (!) 58  Resp: 15  Temp: (!) 96 F (35.6 C)  SpO2: 99%    Filed Weights   08/15/23 1032  Weight: 259 lb (117.5 kg)     Physical Exam Constitutional:      General: Tommy Smith is not in acute distress.    Appearance: Tommy Smith is not diaphoretic.  HENT:     Head: Normocephalic and atraumatic.     Nose: Nose normal.     Mouth/Throat:     Pharynx: No oropharyngeal exudate.  Eyes:     General: No scleral icterus.    Pupils: Pupils are equal, round, and reactive to light.  Cardiovascular:     Rate and Rhythm: Normal rate.  Pulmonary:     Effort: Pulmonary effort is normal. No respiratory distress.     Breath sounds: No wheezing.  Abdominal:     General: There is no distension.     Palpations: Abdomen is soft.     Tenderness: There is no abdominal tenderness.  Musculoskeletal:        General: Normal range of motion.     Cervical back:  Normal range of motion.     Left lower leg: Edema present.  Skin:    General: Skin is warm and dry.     Findings: No erythema.  Neurological:     Mental Status: Tommy Smith is alert and oriented to person, place, and time. Mental status is at baseline.     Cranial Nerves: No cranial nerve deficit.     Motor: No abnormal muscle tone.  Psychiatric:        Mood and Affect: Mood and affect normal.       LABORATORY DATA:  I have reviewed the data as listed    Latest Ref Rng & Units 08/15/2023   10:10 AM 07/14/2023   11:05 AM 07/03/2023    9:05 AM  CBC  WBC 4.0 - 10.5 K/uL 3.0  4.4  3.0   Hemoglobin 13.0 - 17.0 g/dL 16.1  09.6  04.5   Hematocrit 39.0 - 52.0 % 45.4  43.1  41.7   Platelets 150 - 400 K/uL 144  141  125       Latest Ref Rng & Units 08/15/2023   10:10 AM 07/14/2023   11:05 AM 07/03/2023    9:05 AM  CMP  Glucose 70 - 99 mg/dL 93  90  409   BUN 8 - 23 mg/dL 15  11  12    Creatinine 0.61 - 1.24 mg/dL 8.11  9.14  7.82   Sodium 135 - 145 mmol/L 137  138  135   Potassium 3.5 - 5.1 mmol/L 3.9  4.2  4.0   Chloride 98 - 111 mmol/L 105  108  103   CO2 22 - 32 mmol/L 23  24  24    Calcium  8.9 - 10.3 mg/dL 9.1  8.6  8.9   Total Protein 6.5 - 8.1 g/dL 7.1  6.8  6.5   Total Bilirubin 0.0 - 1.2 mg/dL 1.1  1.1  0.9   Alkaline Phos 38 - 126 U/L 46  53  46   AST 15 - 41 U/L 18  17  17  ALT 0 - 44 U/L 20  16  15      RADIOGRAPHIC STUDIES: I have personally reviewed the radiological images as listed and agreed with the findings in the report. No results found.

## 2023-08-15 NOTE — Assessment & Plan Note (Addendum)
 He has been on anticoagulation with Eliquis  5mg  BID- he has a high copay and did not qualify for assistance.  Continue Xarelto . 20mg  daily Patient declined repeat lower extremity ultrasound for follow-up of DVT

## 2023-08-15 NOTE — Assessment & Plan Note (Signed)
Treatment as planned 

## 2023-08-15 NOTE — Assessment & Plan Note (Signed)
 Recommend patient to take calcium 600mg  daily and Vitamin D 1000 units.

## 2023-08-15 NOTE — Assessment & Plan Note (Signed)
Patient follows up with oncology for post marrow transplant vaccination. Off acyclovir 400 mg twice daily

## 2023-08-15 NOTE — Assessment & Plan Note (Signed)
Grade 1, stable symptoms.  Observation 

## 2023-08-16 LAB — KAPPA/LAMBDA LIGHT CHAINS
Kappa free light chain: 16.3 mg/L (ref 3.3–19.4)
Kappa, lambda light chain ratio: 1.25 (ref 0.26–1.65)
Lambda free light chains: 13 mg/L (ref 5.7–26.3)

## 2023-08-18 LAB — MULTIPLE MYELOMA PANEL, SERUM
Albumin SerPl Elph-Mcnc: 3.8 g/dL (ref 2.9–4.4)
Albumin/Glob SerPl: 1.5 (ref 0.7–1.7)
Alpha 1: 0.2 g/dL (ref 0.0–0.4)
Alpha2 Glob SerPl Elph-Mcnc: 0.7 g/dL (ref 0.4–1.0)
B-Globulin SerPl Elph-Mcnc: 1.1 g/dL (ref 0.7–1.3)
Gamma Glob SerPl Elph-Mcnc: 0.6 g/dL (ref 0.4–1.8)
Globulin, Total: 2.6 g/dL (ref 2.2–3.9)
IgA: 315 mg/dL (ref 61–437)
IgG (Immunoglobin G), Serum: 769 mg/dL (ref 603–1613)
IgM (Immunoglobulin M), Srm: 11 mg/dL — ABNORMAL LOW (ref 20–172)
Total Protein ELP: 6.4 g/dL (ref 6.0–8.5)

## 2023-08-29 ENCOUNTER — Encounter (INDEPENDENT_AMBULATORY_CARE_PROVIDER_SITE_OTHER): Payer: Self-pay

## 2023-09-01 ENCOUNTER — Telehealth: Payer: Self-pay | Admitting: *Deleted

## 2023-09-01 NOTE — Telephone Encounter (Signed)
 RSV calling to get refill of the Revlimid .  To the nurse for Dr. Wilhelmenia Harada and she says that the patient has pills and does not need a refill at this time.  I called [employer] and let them know that he does not need a refill of the Revlimid  right now and the nurse is going to check with him next week and then she will put in a refill when it is needed.

## 2023-09-08 ENCOUNTER — Telehealth: Payer: Self-pay | Admitting: *Deleted

## 2023-09-08 NOTE — Telephone Encounter (Signed)
 I called Dom and told him that cbc numbers were low so the MD is holding for the revlimid  and start back 6/13. The nurse for [facility] will put it in when a week before.

## 2023-09-14 ENCOUNTER — Other Ambulatory Visit: Payer: Self-pay

## 2023-09-14 DIAGNOSIS — C9000 Multiple myeloma not having achieved remission: Secondary | ICD-10-CM

## 2023-09-14 MED ORDER — LENALIDOMIDE 5 MG PO CAPS: 5.0000 mg | ORAL_CAPSULE | Freq: Every day | ORAL | 6 refills | Status: DC

## 2023-09-20 ENCOUNTER — Inpatient Hospital Stay

## 2023-09-20 ENCOUNTER — Encounter: Payer: Self-pay | Admitting: Oncology

## 2023-09-20 ENCOUNTER — Inpatient Hospital Stay (HOSPITAL_BASED_OUTPATIENT_CLINIC_OR_DEPARTMENT_OTHER): Admitting: Oncology

## 2023-09-20 ENCOUNTER — Inpatient Hospital Stay: Attending: Oncology

## 2023-09-20 VITALS — BP 147/99 | HR 67 | Temp 98.3°F | Resp 18 | Wt 258.0 lb

## 2023-09-20 DIAGNOSIS — D702 Other drug-induced agranulocytosis: Secondary | ICD-10-CM | POA: Diagnosis not present

## 2023-09-20 DIAGNOSIS — F17210 Nicotine dependence, cigarettes, uncomplicated: Secondary | ICD-10-CM | POA: Insufficient documentation

## 2023-09-20 DIAGNOSIS — G620 Drug-induced polyneuropathy: Secondary | ICD-10-CM

## 2023-09-20 DIAGNOSIS — Z7901 Long term (current) use of anticoagulants: Secondary | ICD-10-CM | POA: Diagnosis not present

## 2023-09-20 DIAGNOSIS — C9000 Multiple myeloma not having achieved remission: Secondary | ICD-10-CM

## 2023-09-20 DIAGNOSIS — Z79899 Other long term (current) drug therapy: Secondary | ICD-10-CM | POA: Insufficient documentation

## 2023-09-20 DIAGNOSIS — Z9481 Bone marrow transplant status: Secondary | ICD-10-CM

## 2023-09-20 DIAGNOSIS — Z5111 Encounter for antineoplastic chemotherapy: Secondary | ICD-10-CM

## 2023-09-20 DIAGNOSIS — I82513 Chronic embolism and thrombosis of femoral vein, bilateral: Secondary | ICD-10-CM | POA: Insufficient documentation

## 2023-09-20 DIAGNOSIS — T451X5A Adverse effect of antineoplastic and immunosuppressive drugs, initial encounter: Secondary | ICD-10-CM

## 2023-09-20 DIAGNOSIS — I825Y1 Chronic embolism and thrombosis of unspecified deep veins of right proximal lower extremity: Secondary | ICD-10-CM

## 2023-09-20 LAB — CMP (CANCER CENTER ONLY)
ALT: 20 U/L (ref 0–44)
AST: 19 U/L (ref 15–41)
Albumin: 3.8 g/dL (ref 3.5–5.0)
Alkaline Phosphatase: 45 U/L (ref 38–126)
Anion gap: 6 (ref 5–15)
BUN: 12 mg/dL (ref 8–23)
CO2: 25 mmol/L (ref 22–32)
Calcium: 8.6 mg/dL — ABNORMAL LOW (ref 8.9–10.3)
Chloride: 106 mmol/L (ref 98–111)
Creatinine: 1.23 mg/dL (ref 0.61–1.24)
GFR, Estimated: >60 mL/min (ref >60)
Glucose, Bld: 96 mg/dL (ref 70–99)
Potassium: 4.1 mmol/L (ref 3.5–5.1)
Sodium: 137 mmol/L (ref 135–145)
Total Bilirubin: 1.1 mg/dL (ref 0.0–1.2)
Total Protein: 6.7 g/dL (ref 6.5–8.1)

## 2023-09-20 LAB — CBC WITH DIFFERENTIAL (CANCER CENTER ONLY)
Abs Immature Granulocytes: 0.01 10*3/uL (ref 0.00–0.07)
Basophils Absolute: 0 10*3/uL (ref 0.0–0.1)
Basophils Relative: 1 %
Eosinophils Absolute: 0.2 10*3/uL (ref 0.0–0.5)
Eosinophils Relative: 5 %
HCT: 43.6 % (ref 39.0–52.0)
Hemoglobin: 14.4 g/dL (ref 13.0–17.0)
Immature Granulocytes: 0 %
Lymphocytes Relative: 52 %
Lymphs Abs: 1.8 10*3/uL (ref 0.7–4.0)
MCH: 30.4 pg (ref 26.0–34.0)
MCHC: 33 g/dL (ref 30.0–36.0)
MCV: 92 fL (ref 80.0–100.0)
Monocytes Absolute: 0.4 10*3/uL (ref 0.1–1.0)
Monocytes Relative: 11 %
Neutro Abs: 1.1 10*3/uL — ABNORMAL LOW (ref 1.7–7.7)
Neutrophils Relative %: 31 %
Platelet Count: 149 10*3/uL — ABNORMAL LOW (ref 150–400)
RBC: 4.74 MIL/uL (ref 4.22–5.81)
RDW: 15.9 % — ABNORMAL HIGH (ref 11.5–15.5)
WBC Count: 3.4 10*3/uL — ABNORMAL LOW (ref 4.0–10.5)
nRBC: 0 % (ref 0.0–0.2)

## 2023-09-20 NOTE — Assessment & Plan Note (Signed)
Patient follows up with oncology for post marrow transplant vaccination. Off acyclovir 400 mg twice daily

## 2023-09-20 NOTE — Assessment & Plan Note (Signed)
 Recommend patient to take calcium 600mg  daily and Vitamin D 1000 units.

## 2023-09-20 NOTE — Assessment & Plan Note (Signed)
Stable ANC. monitor

## 2023-09-20 NOTE — Assessment & Plan Note (Signed)
 He has been on anticoagulation with Eliquis  5mg  BID- he has a high copay and did not qualify for assistance.  Continue Xarelto . 20mg  daily Patient declined repeat lower extremity ultrasound for follow-up of DVT

## 2023-09-20 NOTE — Assessment & Plan Note (Signed)
Grade 1, stable symptoms.  Observation 

## 2023-09-20 NOTE — Progress Notes (Signed)
 Hematology/Oncology Progress note [phone] [phone]        CHIEF COMPLAINTS/REASON FOR VISIT:  Follow-up for multiple myeloma  ASSESSMENT & PLAN:   Cancer Staging  Multiple myeloma not having achieved remission (HCC) Staging form: Plasma Cell Myeloma and Plasma Cell Disorders, AJCC 8th Edition - Clinical stage from 02/04/2020: Beta-2 -microglobulin (mg/L): 2.1, Albumin (g/dL): 2.7, ISS: Stage II, High-risk cytogenetics: Absent, LDH: Unknown - Signed by Tommy Forbes, MD on 05/06/2020   Multiple myeloma not having achieved remission (HCC) #IgG multiple myeloma, stage II, del 1 p, high risk.  Declined bone marrow transplant evaluation. 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 Patient is on maintenance Revlimid   5mg  3 weeks on 1 week off [ dose reduced due to neutropenia]  Labs are reviewed and discussed with patient. Proceed next cycle of Revlimid -   Zometa  Q 3 months - hold today due to hypocalcemia.   Chronic deep vein thrombosis (DVT) (HCC) He has been on anticoagulation with Eliquis  5mg  BID- he has a high copay and did not qualify for assistance.  Continue Xarelto . 20mg  daily Patient declined repeat lower extremity ultrasound for follow-up of DVT   Chemotherapy-induced neuropathy (HCC) Grade 1, stable symptoms.  Observation  Drug-induced neutropenia (HCC) Stable ANC. monitor   Encounter for antineoplastic chemotherapy Treatment as planned.  History of bone marrow transplant [facility]) Patient follows up with oncology for post marrow transplant vaccination. Off acyclovir  400 mg twice daily   Hypocalcemia Recommend patient to take calcium  600mg  daily and Vitamin D  1000 units.     Orders Placed This Encounter  Procedures   CBC with Differential (Cancer Center Only)    Standing Status:   Future    Expected Date:   09/27/2023    Expiration Date:   09/19/2024   CMP (Cancer Center only)    Standing Status:   Future    Expected Date:   09/27/2023    Expiration Date:   09/19/2024   Kappa/lambda light chains    Standing Status:   Future    Expected Date:   09/27/2023    Expiration Date:   09/19/2024   Multiple Myeloma Panel (SPEP&IFE w/QIG)    Standing Status:   Future    Expected Date:   09/27/2023    Expiration Date:   09/19/2024   Follow up in 4 weeks   All questions were answered. The patient knows to call the clinic with any problems, questions or concerns.  Tommy Forbes, MD, PhD [facility] Hematology Oncology 09/20/2023     HISTORY OF PRESENTING ILLNESS:   Tommy Smith. is a  68 y.o.  male presents for follow-up of management of multiple myeloma Oncology history summary listed as below  Oncology History  Multiple myeloma not having achieved remission (HCC)  01/29/2020 Imaging   CT showed multiple bilateral pulmonary nodules measuring up to 10 mm in the right middle lobe, nodules are indeterminate, infectious/inflammatory etiology versus metastatic disease. Multiple osseous lytic lesions, largest lesion involving the left pubic bone concerning for metastatic disease.Compression fractures at T9, age indeterminate. Appearance focal area of thickening at the gastroesophageal junction.  Further evaluation with upper GI study or direct visualization with ndoscopy is recommended No bowel obstruction.Aortic atherosclerosis.   02/04/2020 Cancer Staging  Staging form: Plasma Cell Myeloma and Plasma Cell Disorders, AJCC 8th Edition - Clinical stage from 02/04/2020: Beta-2 -microglobulin (mg/L): 2.1, Albumin (g/dL): 2.7, ISS: Stage II, High-risk cytogenetics: Absent, LDH: Unknown - Signed by Tommy Forbes, MD on 05/06/2020   02/21/2020 Initial Diagnosis    Multiple myeloma  Baseline M protein 4.1, kappa light chain level 333.8 Beta-2  microglobulin 2.1, albumin 2.7.  -02/12/2020, bone marrow biopsy showed hypercellular for age, 80% plasma cell which are complex restricted by light chain in situ heparinization.  Background hematopoiesis is present but reduced. Cytogenetics is normal, Myeloma FISH panel-deletion 1P,Standard risk.   02/27/2020 - 05/06/2021 Chemotherapy   VRd     09/01/2020 Bone Marrow Biopsy   Bone marrow results were reviewed.  1% plasma cell.  Normal cytogenetics.  Myeloma FISH negative for 1p del.   04/21/2021 Bone Marrow Biopsy   Bone marrow after 14 cycles of VRD -[January 2023 ]showed plasma cells 2%.-   06/25/2021 Bone Marrow Transplant   Patient underwent autologous bone marrow transplant at [facility]   09/2021 - 11/10/2022 Chemotherapy   Revlimid  10 mg 3 weeks on 1 week off   10/26/2021 Imaging   Bilateral lower extremity venous US  Partially occlusive right deep vein thrombus extending from the calf veins into the common femoral vein with limited examination of the left extremity demonstrating partially occlusive thrombus in the common femoral vein.     01/13/2022 Imaging   Bilateral lower extremity venous US  1. 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 2. Extensive predominantly occlusive/near occlusive left lower extremity DVT extending from the left common femoral vein through the imaged tibial veins, age-indeterminate in the absence of prior examinations though echogenic thrombus within the left femoral vein suggests a chronic etiology.     01/20/2022 Bone Marrow Biopsy   -Bone marrow biopsy at [facility] showed Cellular bone marrow aspirate with trilineage  hematopoiesis and no morphologic evidence of plasma cell neoplasm.  -Negative minimal residual disease.   12/03/2022 -  Chemotherapy   Revlimid  5mg  3 weeks on, 1 week off.     #Focal area of thickening at the GE junction. 10/23/2020 EGD is normal.   #Lung nodules, measures up to 10 mm on the right middle lobe.  Indeterminate.  Attention on follow-up.  Recommend repeat CT chest and he declined  10/28/2011, unilateral right lower extremity ultrasound showed partially occlusive right deep vein thrombosis extending from the calf vein into the, femoral vein. Patient was recommended to start on Eliquis  for anticoagulation.  He tolerates well.  Right thigh pain has improved.  INTERVAL HISTORY Bodi Palmeri. is a 68 y.o. male who has above history reviewed by me today presents for follow up visit for multiple myeloma Current everyday smoker, 10 cigarettes daily.   Patient denies fever chills.  No new complaints. Takes calcium  supplements intermittently.   Review of Systems  Constitutional:  Negative for appetite change, chills, fatigue, fever and unexpected weight change.  HENT:   Negative for hearing loss and voice change.   Eyes:  Negative for eye problems and icterus.  Respiratory:  Negative for chest tightness, cough and shortness of breath.   Cardiovascular:  Negative for chest pain and leg swelling.  Gastrointestinal:  Negative for abdominal distention, abdominal pain and constipation.  Endocrine: Negative for hot flashes.  Genitourinary:  Negative for difficulty urinating, dysuria and frequency.   Musculoskeletal:  Negative for arthralgias, back pain and neck pain.  Skin:  Negative for itching and rash.  Neurological:  Positive for numbness. Negative for light-headedness.  Hematological:  Negative for adenopathy. Does not bruise/bleed easily.  Psychiatric/Behavioral:  Negative for confusion.      MEDICAL HISTORY:  Past Medical History:  Diagnosis Date   Cancer (HCC)     Depression    Hypercalcemia 03/02/2020   Hyperlipemia    Hypertension    Hypogonadism in male    Multiple myeloma [facility])     SURGICAL HISTORY: Past Surgical History:  Procedure Laterality Date   BONE MARROW BIOPSY     COLONOSCOPY  07/14/2008   ESOPHAGOGASTRODUODENOSCOPY N/A 10/22/2020   Procedure: ESOPHAGOGASTRODUODENOSCOPY (EGD);  Surgeon: Luke Salaam, MD;  Location: [facility] ENDOSCOPY;  Service: Gastroenterology;  Laterality: N/A;    SOCIAL HISTORY: Social History   Socioeconomic History   Marital status: Married    Spouse name: Catha Clink   Number of children: 3   Years of education: Not on file   Highest education level: Not on file  Occupational History   Not on file  Tobacco Use   Smoking status: Every Day    Current packs/day: 0.50    Average packs/day: 0.5 packs/day for 30.0 years (15.0 ttl pk-yrs)    Types: Cigarettes   Smokeless tobacco: Never  Substance and Sexual Activity   Alcohol use: Yes    Comment: occcassional   Drug use: No   Sexual activity: Not on file  Other Topics Concern   Not on file  Social History Narrative   Not on file   Social Drivers of Health   Financial Resource Strain: Low Risk  (06/23/2023)   Received from [facility]   Overall Financial Resource Strain (CARDIA)    Difficulty of Paying Living Expenses: Not hard at all  Food Insecurity: No Food Insecurity (06/23/2023)   Received from [facility]   Hunger Vital Sign    Worried About Running Out of Food in the Last Year: Never true    Ran Out of Food in the Last Year: Never true  Transportation Needs: No Transportation Needs (06/23/2023)   Received from [facility] - Transportation    In the past 12 months, has lack of transportation kept you from medical appointments or from getting medications?: No    Lack of Transportation (Non-Medical): No  Physical Activity: Not on file  Stress: Not on file  Social Connections: Not on  file  Intimate Partner Violence: Not At Risk (11/10/2022)   Humiliation, Afraid, Rape, and Kick questionnaire    Fear of Current or Ex-Partner: No    Emotionally Abused: No    Physically Abused: No    Sexually Abused: No    FAMILY HISTORY: Family History  Problem Relation Age of Onset   Skin cancer Mother    Prostate cancer Father     ALLERGIES:  has no known allergies.  MEDICATIONS:  Current Outpatient Medications  Medication Sig Dispense Refill   buPROPion  (WELLBUTRIN  XL) 300 MG 24 hr tablet Take 300 mg by mouth daily.     cholecalciferol (VITAMIN D3) 25 MCG (1000 UNIT) tablet Take 2,000 Units by mouth daily.     lenalidomide  (REVLIMID ) 5 MG capsule Take 1 capsule (5 mg total) by mouth daily. Take for 21 days, then hold for 7 days. Repeat every 28 days. 21 capsule 6  rivaroxaban  (XARELTO ) 20 MG TABS tablet Take 1 tablet (20 mg total) by mouth daily with supper. 30 tablet 5   No current facility-administered medications for this visit.     PHYSICAL EXAMINATION: ECOG PERFORMANCE STATUS: 1 - Symptomatic but completely ambulatory Vitals:   09/20/23 1041  BP: (!) 147/99  Pulse: 67  Resp: 18  Temp: 98.3 F (36.8 C)  SpO2: 100%    Filed Weights   09/20/23 1041  Weight: 258 lb (117 kg)     Physical Exam Constitutional:      General: He is not in acute distress.    Appearance: He is not diaphoretic.  HENT:     Head: Normocephalic and atraumatic.     Nose: Nose normal.     Mouth/Throat:     Pharynx: No oropharyngeal exudate.  Eyes:     General: No scleral icterus.    Pupils: Pupils are equal, round, and reactive to light.  Cardiovascular:     Rate and Rhythm: Normal rate.  Pulmonary:     Effort: Pulmonary effort is normal. No respiratory distress.     Breath sounds: No wheezing.  Abdominal:     General: There is no distension.     Palpations: Abdomen is soft.     Tenderness: There is no abdominal tenderness.  Musculoskeletal:        General: Normal range  of motion.     Cervical back: Normal range of motion.     Left lower leg: Edema present.  Skin:    General: Skin is warm and dry.     Findings: No erythema.  Neurological:     Mental Status: He is alert and oriented to person, place, and time. Mental status is at baseline.     Cranial Nerves: No cranial nerve deficit.     Motor: No abnormal muscle tone.  Psychiatric:        Mood and Affect: Mood and affect normal.       LABORATORY DATA:  I have reviewed the data as listed    Latest Ref Rng & Units 09/20/2023   10:31 AM 08/15/2023   10:10 AM 07/14/2023   11:05 AM  CBC  WBC 4.0 - 10.5 K/uL 3.4  3.0  4.4   Hemoglobin 13.0 - 17.0 g/dL 30.8  65.7  84.6   Hematocrit 39.0 - 52.0 % 43.6  45.4  43.1   Platelets 150 - 400 K/uL 149  144  141       Latest Ref Rng & Units 09/20/2023   10:31 AM 08/15/2023   10:10 AM 07/14/2023   11:05 AM  CMP  Glucose 70 - 99 mg/dL 96  93  90   BUN 8 - 23 mg/dL 12  15  11    Creatinine 0.61 - 1.24 mg/dL 9.62  9.52  8.41   Sodium 135 - 145 mmol/L 137  137  138   Potassium 3.5 - 5.1 mmol/L 4.1  3.9  4.2   Chloride 98 - 111 mmol/L 106  105  108   CO2 22 - 32 mmol/L 25  23  24    Calcium  8.9 - 10.3 mg/dL 8.6  9.1  8.6   Total Protein 6.5 - 8.1 g/dL 6.7  7.1  6.8   Total Bilirubin 0.0 - 1.2 mg/dL 1.1  1.1  1.1   Alkaline Phos 38 - 126 U/L 45  46  53   AST 15 - 41 U/L 19  18  17    ALT 0 -  44 U/L 20  20  16      RADIOGRAPHIC STUDIES: I have personally reviewed the radiological images as listed and agreed with the findings in the report. No results found.

## 2023-09-20 NOTE — Assessment & Plan Note (Signed)
Treatment as planned 

## 2023-09-20 NOTE — Assessment & Plan Note (Addendum)
#  IgG multiple myeloma, stage II, del 1 p, high risk.  Declined bone marrow transplant evaluation. 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 Patient is on maintenance Revlimid   5mg  3 weeks on 1 week off [ dose reduced due to neutropenia]  Labs are reviewed and discussed with patient. Proceed next cycle of Revlimid -   Zometa  Q 3 months - hold today due to hypocalcemia.

## 2023-09-20 NOTE — Progress Notes (Signed)
 Patient says that he has been doing well no new questions or concerns for the doctor today.

## 2023-09-21 LAB — KAPPA/LAMBDA LIGHT CHAINS
Kappa free light chain: 14.9 mg/L (ref 3.3–19.4)
Kappa, lambda light chain ratio: 1.11 (ref 0.26–1.65)
Lambda free light chains: 13.4 mg/L (ref 5.7–26.3)

## 2023-09-24 LAB — MULTIPLE MYELOMA PANEL, SERUM
Albumin SerPl Elph-Mcnc: 3.4 g/dL (ref 2.9–4.4)
Albumin/Glob SerPl: 1.3 (ref 0.7–1.7)
Alpha 1: 0.2 g/dL (ref 0.0–0.4)
Alpha2 Glob SerPl Elph-Mcnc: 0.7 g/dL (ref 0.4–1.0)
B-Globulin SerPl Elph-Mcnc: 1.2 g/dL (ref 0.7–1.3)
Gamma Glob SerPl Elph-Mcnc: 0.7 g/dL (ref 0.4–1.8)
Globulin, Total: 2.8 g/dL (ref 2.2–3.9)
IgA: 311 mg/dL (ref 61–437)
IgG (Immunoglobin G), Serum: 729 mg/dL (ref 603–1613)
IgM (Immunoglobulin M), Srm: 12 mg/dL — ABNORMAL LOW (ref 20–172)
Total Protein ELP: 6.2 g/dL (ref 6.0–8.5)

## 2023-10-10 ENCOUNTER — Other Ambulatory Visit: Payer: Self-pay | Admitting: Oncology

## 2023-10-10 DIAGNOSIS — C9000 Multiple myeloma not having achieved remission: Secondary | ICD-10-CM

## 2023-10-11 ENCOUNTER — Other Ambulatory Visit: Payer: Self-pay

## 2023-10-11 ENCOUNTER — Telehealth: Payer: Self-pay | Admitting: *Deleted

## 2023-10-11 DIAGNOSIS — C9000 Multiple myeloma not having achieved remission: Secondary | ICD-10-CM

## 2023-10-11 MED ORDER — LENALIDOMIDE 5 MG PO CAPS: 5.0000 mg | ORAL_CAPSULE | Freq: Every day | ORAL | 0 refills | Status: DC

## 2023-10-11 NOTE — Telephone Encounter (Signed)
 [facility] called and needs a rems number on the Revlimid  script as soon as possible so that script can be processed.

## 2023-10-16 ENCOUNTER — Telehealth: Payer: Self-pay | Admitting: Oncology

## 2023-10-16 NOTE — Telephone Encounter (Signed)
 Pt stated he left paperwork at the front desk. Pt is going to have dental work done and needs the paperwork filled out before he can get that scheduled. Pt stated he has been trying to reach the triage nurse for 2 hours and cannot reach anyone. Pt wants to know when his paperwork will be filled out.

## 2023-10-17 NOTE — Telephone Encounter (Signed)
 Form has been completed. Pt notified and said he will pick up at Friday's appt

## 2023-10-20 ENCOUNTER — Inpatient Hospital Stay: Attending: Oncology

## 2023-10-20 ENCOUNTER — Inpatient Hospital Stay: Admitting: Oncology

## 2023-10-20 ENCOUNTER — Encounter: Payer: Self-pay | Admitting: Oncology

## 2023-10-20 ENCOUNTER — Inpatient Hospital Stay

## 2023-10-20 VITALS — BP 136/87 | HR 67 | Temp 97.8°F | Resp 16 | Wt 256.0 lb

## 2023-10-20 DIAGNOSIS — C9000 Multiple myeloma not having achieved remission: Secondary | ICD-10-CM

## 2023-10-20 DIAGNOSIS — Z720 Tobacco use: Secondary | ICD-10-CM | POA: Insufficient documentation

## 2023-10-20 DIAGNOSIS — Z9481 Bone marrow transplant status: Secondary | ICD-10-CM | POA: Diagnosis not present

## 2023-10-20 DIAGNOSIS — I825Y1 Chronic embolism and thrombosis of unspecified deep veins of right proximal lower extremity: Secondary | ICD-10-CM | POA: Diagnosis not present

## 2023-10-20 DIAGNOSIS — Z79624 Long term (current) use of inhibitors of nucleotide synthesis: Secondary | ICD-10-CM | POA: Insufficient documentation

## 2023-10-20 DIAGNOSIS — I10 Essential (primary) hypertension: Secondary | ICD-10-CM | POA: Diagnosis not present

## 2023-10-20 DIAGNOSIS — G620 Drug-induced polyneuropathy: Secondary | ICD-10-CM | POA: Diagnosis not present

## 2023-10-20 DIAGNOSIS — Z86718 Personal history of other venous thrombosis and embolism: Secondary | ICD-10-CM | POA: Diagnosis not present

## 2023-10-20 DIAGNOSIS — Z7901 Long term (current) use of anticoagulants: Secondary | ICD-10-CM | POA: Diagnosis not present

## 2023-10-20 DIAGNOSIS — Z5111 Encounter for antineoplastic chemotherapy: Secondary | ICD-10-CM

## 2023-10-20 DIAGNOSIS — E785 Hyperlipidemia, unspecified: Secondary | ICD-10-CM | POA: Diagnosis not present

## 2023-10-20 DIAGNOSIS — D702 Other drug-induced agranulocytosis: Secondary | ICD-10-CM | POA: Diagnosis not present

## 2023-10-20 DIAGNOSIS — F17210 Nicotine dependence, cigarettes, uncomplicated: Secondary | ICD-10-CM | POA: Diagnosis not present

## 2023-10-20 DIAGNOSIS — T451X5A Adverse effect of antineoplastic and immunosuppressive drugs, initial encounter: Secondary | ICD-10-CM | POA: Insufficient documentation

## 2023-10-20 LAB — CBC WITH DIFFERENTIAL (CANCER CENTER ONLY)
Abs Immature Granulocytes: 0.01 K/uL (ref 0.00–0.07)
Basophils Absolute: 0 K/uL (ref 0.0–0.1)
Basophils Relative: 1 %
Eosinophils Absolute: 0.2 K/uL (ref 0.0–0.5)
Eosinophils Relative: 6 %
HCT: 44 % (ref 39.0–52.0)
Hemoglobin: 14.2 g/dL (ref 13.0–17.0)
Immature Granulocytes: 0 %
Lymphocytes Relative: 48 %
Lymphs Abs: 1.5 K/uL (ref 0.7–4.0)
MCH: 30.2 pg (ref 26.0–34.0)
MCHC: 32.3 g/dL (ref 30.0–36.0)
MCV: 93.6 fL (ref 80.0–100.0)
Monocytes Absolute: 0.3 K/uL (ref 0.1–1.0)
Monocytes Relative: 9 %
Neutro Abs: 1.1 K/uL — ABNORMAL LOW (ref 1.7–7.7)
Neutrophils Relative %: 36 %
Platelet Count: 158 K/uL (ref 150–400)
RBC: 4.7 MIL/uL (ref 4.22–5.81)
RDW: 15.8 % — ABNORMAL HIGH (ref 11.5–15.5)
WBC Count: 3 K/uL — ABNORMAL LOW (ref 4.0–10.5)
nRBC: 0 % (ref 0.0–0.2)

## 2023-10-20 LAB — CMP (CANCER CENTER ONLY)
ALT: 20 U/L (ref 0–44)
AST: 18 U/L (ref 15–41)
Albumin: 3.8 g/dL (ref 3.5–5.0)
Alkaline Phosphatase: 46 U/L (ref 38–126)
Anion gap: 6 (ref 5–15)
BUN: 12 mg/dL (ref 8–23)
CO2: 23 mmol/L (ref 22–32)
Calcium: 8.7 mg/dL — ABNORMAL LOW (ref 8.9–10.3)
Chloride: 106 mmol/L (ref 98–111)
Creatinine: 1.17 mg/dL (ref 0.61–1.24)
GFR, Estimated: >60 mL/min (ref >60)
Glucose, Bld: 92 mg/dL (ref 70–99)
Potassium: 4.3 mmol/L (ref 3.5–5.1)
Sodium: 135 mmol/L (ref 135–145)
Total Bilirubin: 1 mg/dL (ref 0.0–1.2)
Total Protein: 6.6 g/dL (ref 6.5–8.1)

## 2023-10-20 MED ORDER — ZOLEDRONIC ACID 4 MG/100ML IV SOLN
4.0000 mg | Freq: Once | INTRAVENOUS | Status: AC
Start: 2023-10-20 — End: 2023-10-20
  Administered 2023-10-20: 4 mg via INTRAVENOUS
  Filled 2023-10-20: qty 100

## 2023-10-20 NOTE — Assessment & Plan Note (Addendum)
#  IgG multiple myeloma, stage II, del 1 p, high risk.  Declined bone marrow transplant evaluation. 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 Patient is on maintenance Revlimid   5mg  3 weeks on 1 week off [ dose reduced due to neutropenia]  Labs are reviewed and discussed with patient. Proceed next cycle of Revlimid -   Zometa  Q 3 months - proceed today next due Oct 2025

## 2023-10-20 NOTE — Patient Instructions (Signed)

## 2023-10-20 NOTE — Assessment & Plan Note (Signed)
 Recommend patient to take calcium 600mg  daily and Vitamin D 1000 units.

## 2023-10-20 NOTE — Assessment & Plan Note (Signed)
Treatment as planned 

## 2023-10-20 NOTE — Progress Notes (Signed)
 Pt reports,  I had an Infection in my tooth and completed one week of antibiotics yesterday 10/19/2023. Pt denies any jaw pain or tooth pain. Notified Dr. Babara and received the ok to continue with Zometa  infusion.

## 2023-10-20 NOTE — Assessment & Plan Note (Signed)
 He has been on anticoagulation with Eliquis  5mg  BID- he has a high copay and did not qualify for assistance.  Continue Xarelto . 20mg  daily Patient declined repeat lower extremity ultrasound for follow-up of DVT

## 2023-10-20 NOTE — Assessment & Plan Note (Signed)
Stable ANC. monitor

## 2023-10-20 NOTE — Assessment & Plan Note (Signed)
Patient follows up with oncology for post marrow transplant vaccination. Off acyclovir 400 mg twice daily

## 2023-10-20 NOTE — Assessment & Plan Note (Signed)
 Recommend CT chest screening. He declined.

## 2023-10-20 NOTE — Assessment & Plan Note (Signed)
Grade 1, stable symptoms.  Observation 

## 2023-10-20 NOTE — Progress Notes (Addendum)
 Hematology/Oncology Progress note Telephone:([phone] Fax:([phone]        CHIEF COMPLAINTS/REASON FOR VISIT:  Follow-up for multiple myeloma  ASSESSMENT & PLAN:   Cancer Staging  Multiple myeloma not having achieved remission (HCC) Staging form: Plasma Cell Myeloma and Plasma Cell Disorders, AJCC 8th Edition - Clinical stage from 02/04/2020: Beta-2 -microglobulin (mg/L): 2.1, Albumin (g/dL): 2.7, ISS: Stage II, High-risk cytogenetics: Absent, LDH: Unknown - Signed by Babara Call, MD on 05/06/2020   Multiple myeloma not having achieved remission (HCC) #IgG multiple myeloma, stage II, del 1 p, high risk.  Declined bone marrow transplant evaluation. 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 Patient is on maintenance Revlimid   5mg  3 weeks on 1 week off [ dose reduced due to neutropenia]  Labs are reviewed and discussed with patient. Proceed next cycle of Revlimid -   Zometa  Q 3 months - proceed today next due Oct 2025   Chemotherapy-induced neuropathy (HCC) Grade 1, stable symptoms.  Observation  Chronic deep vein thrombosis (DVT) (HCC) He has been on anticoagulation with Eliquis  5mg  BID- he has a high copay and did not qualify for assistance.  Continue Xarelto . 20mg  daily Patient declined repeat lower extremity ultrasound for follow-up of DVT   Drug-induced neutropenia (HCC) Stable ANC. monitor   Encounter for antineoplastic chemotherapy Treatment as planned.  History of bone marrow transplant [facility]) Patient follows up with oncology for post marrow transplant vaccination. Off acyclovir  400 mg twice daily   Hypocalcemia Recommend patient to take calcium  600mg  daily and Vitamin D  1000 units.   Tobacco use Recommend CT chest screening. He declined.     Orders Placed This Encounter  Procedures   CMP (Cancer Center only)    Standing Status:   Future    Expected Date:   11/17/2023    Expiration Date:   02/15/2024   CBC with Differential (Cancer Center Only)    Standing Status:   Future    Expected Date:   11/17/2023    Expiration Date:   02/15/2024   Multiple Myeloma Panel (SPEP&IFE w/QIG)    Standing Status:   Future    Expected Date:   11/17/2023    Expiration Date:   02/15/2024   Kappa/lambda light chains    Standing Status:   Future    Expected Date:   11/17/2023    Expiration Date:   02/15/2024   Follow up in 4 weeks   All questions were answered. The patient knows to call the clinic with any problems, questions or concerns.  Call Babara, MD, PhD [facility] Hematology Oncology 10/20/2023     HISTORY OF PRESENTING ILLNESS:   Tommy Smith. is a  68 y.o.  male presents for follow-up of management of multiple myeloma Oncology history summary listed as below  Oncology History  Multiple myeloma not having achieved remission (HCC)  01/29/2020 Imaging   CT showed multiple bilateral pulmonary nodules measuring up to 10 mm in the right middle lobe, nodules are indeterminate, infectious/inflammatory etiology versus metastatic disease. Multiple osseous lytic lesions, largest lesion involving the left pubic bone concerning for metastatic disease.Compression fractures at T9, age indeterminate. Appearance focal area of thickening at the gastroesophageal junction.  Further evaluation with upper GI study or direct visualization with ndoscopy  is recommended No bowel obstruction.Aortic atherosclerosis.   02/04/2020 Cancer Staging   Staging form: Plasma Cell Myeloma and Plasma Cell Disorders, AJCC 8th Edition - Clinical stage from 02/04/2020: Beta-2 -microglobulin (mg/L): 2.1, Albumin (g/dL): 2.7, ISS: Stage II, High-risk cytogenetics: Absent, LDH: Unknown - Signed by Babara Call, MD on 05/06/2020   02/21/2020 Initial Diagnosis   Multiple myeloma  Baseline M protein 4.1, kappa light chain level 333.8 Beta-2  microglobulin 2.1, albumin 2.7.  -02/12/2020, bone marrow biopsy showed hypercellular for age, 80% plasma cell which are complex restricted by light chain in situ heparinization.  Background hematopoiesis is present but reduced. Cytogenetics is normal, Myeloma FISH panel-deletion 1P,Standard risk.   02/27/2020 - 05/06/2021 Chemotherapy   VRd     09/01/2020 Bone Marrow Biopsy   Bone marrow results were reviewed.  1% plasma cell.  Normal cytogenetics.  Myeloma FISH negative for 1p del.   04/21/2021 Bone Marrow Biopsy   Bone marrow after 14 cycles of VRD -[January 2023 ]showed plasma cells 2%.-   06/25/2021 Bone Marrow Transplant   Patient underwent autologous bone marrow transplant at [facility]   09/2021 - 11/10/2022 Chemotherapy   Revlimid  10 mg 3 weeks on 1 week off   10/26/2021 Imaging   Bilateral lower extremity venous US  Partially occlusive right deep vein thrombus extending from the calf veins into the common femoral vein with limited examination of the left extremity demonstrating partially occlusive thrombus in the common femoral vein.     01/13/2022 Imaging   Bilateral lower extremity venous US  1. 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 2. Extensive predominantly occlusive/near occlusive left lower extremity DVT extending from the left common femoral vein through the imaged tibial veins, age-indeterminate in the absence of prior examinations though echogenic thrombus within the left femoral vein suggests a chronic etiology.     01/20/2022 Bone Marrow Biopsy   -Bone marrow biopsy at [facility] showed  Cellular bone marrow aspirate with trilineage hematopoiesis and no morphologic evidence of plasma cell neoplasm.  -Negative minimal residual disease.   12/03/2022 -  Chemotherapy   Revlimid  5mg  3 weeks on, 1 week off.     #Focal area of thickening at the GE junction. 10/23/2020 EGD is normal.   #Lung nodules, measures up to 10 mm on the right middle lobe.  Indeterminate.  Attention on follow-up.  Recommend repeat CT chest and he declined  10/28/2011, unilateral right lower extremity ultrasound showed partially occlusive right deep vein thrombosis extending from the calf vein into the, femoral vein. Patient was recommended to start on Eliquis  for anticoagulation.  He tolerates well.  Right thigh pain has improved.  INTERVAL HISTORY Tommy Smith. is a 68 y.o. male who has above history reviewed by me today presents for follow up visit for multiple myeloma Current everyday smoker, 10 cigarettes daily.   Patient denies fever chills.  No new complaints. Takes calcium  supplements intermittently.   Review of Systems  Constitutional:  Negative for appetite change, chills, fatigue, fever and unexpected weight change.  HENT:   Negative for hearing loss and voice change.   Eyes:  Negative for eye problems and icterus.  Respiratory:  Negative for chest tightness, cough and shortness of breath.   Cardiovascular:  Negative for chest pain and leg swelling.  Gastrointestinal:  Negative for abdominal distention,  abdominal pain and constipation.  Endocrine: Negative for hot flashes.  Genitourinary:  Negative for difficulty urinating, dysuria and frequency.   Musculoskeletal:  Negative for arthralgias, back pain and neck pain.  Skin:  Negative for itching and rash.  Neurological:  Positive for numbness. Negative for light-headedness.  Hematological:  Negative for adenopathy. Does not bruise/bleed easily.  Psychiatric/Behavioral:  Negative for confusion.      MEDICAL HISTORY:  Past Medical  History:  Diagnosis Date   Cancer (HCC)    Depression    Hypercalcemia 03/02/2020   Hyperlipemia    Hypertension    Hypogonadism in male    Multiple myeloma [facility])     SURGICAL HISTORY: Past Surgical History:  Procedure Laterality Date   BONE MARROW BIOPSY     COLONOSCOPY  07/14/2008   ESOPHAGOGASTRODUODENOSCOPY N/A 10/22/2020   Procedure: ESOPHAGOGASTRODUODENOSCOPY (EGD);  Surgeon: Therisa Bi, MD;  Location: [facility] ENDOSCOPY;  Service: Gastroenterology;  Laterality: N/A;    SOCIAL HISTORY: Social History   Socioeconomic History   Marital status: Married    Spouse name: Delphine   Number of children: 3   Years of education: Not on file   Highest education level: Not on file  Occupational History   Not on file  Tobacco Use   Smoking status: Every Day    Current packs/day: 0.50    Average packs/day: 0.5 packs/day for 30.0 years (15.0 ttl pk-yrs)    Types: Cigarettes   Smokeless tobacco: Never  Substance and Sexual Activity   Alcohol use: Yes    Comment: occcassional   Drug use: No   Sexual activity: Not on file  Other Topics Concern   Not on file  Social History Narrative   Not on file   Social Drivers of Health   Financial Resource Strain: Low Risk  (06/23/2023)   Received from [facility]   Overall Financial Resource Strain (CARDIA)    Difficulty of Paying Living Expenses: Not hard at all  Food Insecurity: No Food Insecurity (06/23/2023)   Received from [facility]   Hunger Vital Sign    Within the past 12 months, you worried that your food would run out before you got the money to buy more.: Never true    Within the past 12 months, the food you bought just didn't last and you didn't have money to get more.: Never true  Transportation Needs: No Transportation Needs (06/23/2023)   Received [facility] - Transportation    In the past 12 months, has lack of transportation kept you from medical  appointments or from getting medications?: No    Lack of Transportation (Non-Medical): No  Physical Activity: Not on file  Stress: Not on file  Social Connections: Not on file  Intimate Partner Violence: Not At Risk (11/10/2022)   Humiliation, Afraid, Rape, and Kick questionnaire    Fear of Current or Ex-Partner: No    Emotionally Abused: No    Physically Abused: No    Sexually Abused: No    FAMILY HISTORY: Family History  Problem Relation Age of Onset   Skin cancer Mother    Prostate cancer Father     ALLERGIES:  has no known allergies.  MEDICATIONS:  Current Outpatient Medications  Medication Sig Dispense Refill   buPROPion  (WELLBUTRIN  XL) 300 MG 24 hr tablet Take 300 mg by mouth daily.     cholecalciferol (VITAMIN D3) 25 MCG (1000 UNIT) tablet Take 2,000 Units by mouth daily.  lenalidomide  (REVLIMID ) 5 MG capsule Take 1 capsule (5 mg total) by mouth daily. [employer] Auth # [id number]     Date Obtained 7/2/2025Take 1 capsule by mouth once daily for 21 days, then 7 days off. 21 capsule 0   rivaroxaban  (XARELTO ) 20 MG TABS tablet Take 1 tablet (20 mg total) by mouth daily with supper. 30 tablet 5   No current facility-administered medications for this visit.     PHYSICAL EXAMINATION: ECOG PERFORMANCE STATUS: 1 - Symptomatic but completely ambulatory Vitals:   10/20/23 1125  BP: 136/87  Pulse: 67  Resp: 16  Temp: 97.8 F (36.6 C)  SpO2: 98%    Filed Weights   10/20/23 1125  Weight: 256 lb (116.1 kg)     Physical Exam Constitutional:      General: He is not in acute distress.    Appearance: He is not diaphoretic.  HENT:     Head: Normocephalic and atraumatic.     Nose: Nose normal.     Mouth/Throat:     Pharynx: No oropharyngeal exudate.  Eyes:     General: No scleral icterus.    Pupils: Pupils are equal, round, and reactive to light.  Cardiovascular:     Rate and Rhythm: Normal rate.  Pulmonary:     Effort: Pulmonary effort is normal. No respiratory  distress.     Breath sounds: No wheezing.  Abdominal:     General: There is no distension.     Palpations: Abdomen is soft.     Tenderness: There is no abdominal tenderness.  Musculoskeletal:        General: Normal range of motion.     Cervical back: Normal range of motion.     Left lower leg: Edema present.  Skin:    General: Skin is warm and dry.     Findings: No erythema.  Neurological:     Mental Status: He is alert and oriented to person, place, and time. Mental status is at baseline.     Cranial Nerves: No cranial nerve deficit.     Motor: No abnormal muscle tone.  Psychiatric:        Mood and Affect: Mood and affect normal.       LABORATORY DATA:  I have reviewed the data as listed    Latest Ref Rng & Units 10/20/2023   11:15 AM 09/20/2023   10:31 AM 08/15/2023   10:10 AM  CBC  WBC 4.0 - 10.5 K/uL 3.0  3.4  3.0   Hemoglobin 13.0 - 17.0 g/dL 85.7  85.5  85.1   Hematocrit 39.0 - 52.0 % 44.0  43.6  45.4   Platelets 150 - 400 K/uL 158  149  144       Latest Ref Rng & Units 10/20/2023   11:15 AM 09/20/2023   10:31 AM 08/15/2023   10:10 AM  CMP  Glucose 70 - 99 mg/dL 92  96  93   BUN 8 - 23 mg/dL 12  12  15    Creatinine 0.61 - 1.24 mg/dL 8.82  8.76  8.98   Sodium 135 - 145 mmol/L 135  137  137   Potassium 3.5 - 5.1 mmol/L 4.3  4.1  3.9   Chloride 98 - 111 mmol/L 106  106  105   CO2 22 - 32 mmol/L 23  25  23    Calcium  8.9 - 10.3 mg/dL 8.7  8.6  9.1   Total Protein 6.5 - 8.1 g/dL 6.6  6.7  7.1  Total Bilirubin 0.0 - 1.2 mg/dL 1.0  1.1  1.1   Alkaline Phos 38 - 126 U/L 46  45  46   AST 15 - 41 U/L 18  19  18    ALT 0 - 44 U/L 20  20  20      RADIOGRAPHIC STUDIES: I have personally reviewed the radiological images as listed and agreed with the findings in the report. No results found.

## 2023-10-23 LAB — KAPPA/LAMBDA LIGHT CHAINS
Kappa free light chain: 18.7 mg/L (ref 3.3–19.4)
Kappa, lambda light chain ratio: 1.11 (ref 0.26–1.65)
Lambda free light chains: 16.8 mg/L (ref 5.7–26.3)

## 2023-10-24 LAB — MULTIPLE MYELOMA PANEL, SERUM
Albumin SerPl Elph-Mcnc: 3.4 g/dL (ref 2.9–4.4)
Albumin/Glob SerPl: 1.4 (ref 0.7–1.7)
Alpha 1: 0.2 g/dL (ref 0.0–0.4)
Alpha2 Glob SerPl Elph-Mcnc: 0.6 g/dL (ref 0.4–1.0)
B-Globulin SerPl Elph-Mcnc: 1.1 g/dL (ref 0.7–1.3)
Gamma Glob SerPl Elph-Mcnc: 0.7 g/dL (ref 0.4–1.8)
Globulin, Total: 2.6 g/dL (ref 2.2–3.9)
IgA: 337 mg/dL (ref 61–437)
IgG (Immunoglobin G), Serum: 787 mg/dL (ref 603–1613)
IgM (Immunoglobulin M), Srm: 12 mg/dL — ABNORMAL LOW (ref 20–172)
Total Protein ELP: 6 g/dL (ref 6.0–8.5)

## 2023-11-10 ENCOUNTER — Other Ambulatory Visit: Payer: Self-pay

## 2023-11-10 DIAGNOSIS — C9000 Multiple myeloma not having achieved remission: Secondary | ICD-10-CM

## 2023-11-10 MED ORDER — LENALIDOMIDE 5 MG PO CAPS: 5.0000 mg | ORAL_CAPSULE | Freq: Every day | ORAL | 0 refills | Status: DC

## 2023-11-19 LAB — COLOGUARD

## 2023-11-22 ENCOUNTER — Inpatient Hospital Stay: Admitting: Oncology

## 2023-11-22 ENCOUNTER — Inpatient Hospital Stay: Attending: Oncology

## 2023-11-22 ENCOUNTER — Encounter: Payer: Self-pay | Admitting: Oncology

## 2023-11-22 VITALS — BP 134/92 | HR 77 | Temp 97.2°F | Resp 16 | Wt 255.0 lb

## 2023-11-22 DIAGNOSIS — G620 Drug-induced polyneuropathy: Secondary | ICD-10-CM | POA: Insufficient documentation

## 2023-11-22 DIAGNOSIS — Z86718 Personal history of other venous thrombosis and embolism: Secondary | ICD-10-CM | POA: Diagnosis not present

## 2023-11-22 DIAGNOSIS — F17210 Nicotine dependence, cigarettes, uncomplicated: Secondary | ICD-10-CM | POA: Insufficient documentation

## 2023-11-22 DIAGNOSIS — C9000 Multiple myeloma not having achieved remission: Secondary | ICD-10-CM | POA: Insufficient documentation

## 2023-11-22 DIAGNOSIS — I825Y1 Chronic embolism and thrombosis of unspecified deep veins of right proximal lower extremity: Secondary | ICD-10-CM | POA: Diagnosis not present

## 2023-11-22 DIAGNOSIS — Z7901 Long term (current) use of anticoagulants: Secondary | ICD-10-CM | POA: Diagnosis not present

## 2023-11-22 DIAGNOSIS — Z9221 Personal history of antineoplastic chemotherapy: Secondary | ICD-10-CM | POA: Insufficient documentation

## 2023-11-22 DIAGNOSIS — Z720 Tobacco use: Secondary | ICD-10-CM

## 2023-11-22 DIAGNOSIS — T451X5A Adverse effect of antineoplastic and immunosuppressive drugs, initial encounter: Secondary | ICD-10-CM

## 2023-11-22 DIAGNOSIS — Z5111 Encounter for antineoplastic chemotherapy: Secondary | ICD-10-CM

## 2023-11-22 LAB — CMP (CANCER CENTER ONLY)
ALT: 19 U/L (ref 0–44)
AST: 19 U/L (ref 15–41)
Albumin: 3.8 g/dL (ref 3.5–5.0)
Alkaline Phosphatase: 54 U/L (ref 38–126)
Anion gap: 6 (ref 5–15)
BUN: 14 mg/dL (ref 8–23)
CO2: 26 mmol/L (ref 22–32)
Calcium: 9.5 mg/dL (ref 8.9–10.3)
Chloride: 104 mmol/L (ref 98–111)
Creatinine: 1.17 mg/dL (ref 0.61–1.24)
GFR, Estimated: >60 mL/min (ref >60)
Glucose, Bld: 90 mg/dL (ref 70–99)
Potassium: 4.3 mmol/L (ref 3.5–5.1)
Sodium: 136 mmol/L (ref 135–145)
Total Bilirubin: 1.4 mg/dL — ABNORMAL HIGH (ref 0.0–1.2)
Total Protein: 7.3 g/dL (ref 6.5–8.1)

## 2023-11-22 LAB — CBC WITH DIFFERENTIAL (CANCER CENTER ONLY)
Abs Immature Granulocytes: 0 K/uL (ref 0.00–0.07)
Basophils Absolute: 0 K/uL (ref 0.0–0.1)
Basophils Relative: 1 %
Eosinophils Absolute: 0.1 K/uL (ref 0.0–0.5)
Eosinophils Relative: 4 %
HCT: 45.2 % (ref 39.0–52.0)
Hemoglobin: 14.8 g/dL (ref 13.0–17.0)
Immature Granulocytes: 0 %
Lymphocytes Relative: 45 %
Lymphs Abs: 1.7 K/uL (ref 0.7–4.0)
MCH: 30.3 pg (ref 26.0–34.0)
MCHC: 32.7 g/dL (ref 30.0–36.0)
MCV: 92.4 fL (ref 80.0–100.0)
Monocytes Absolute: 0.5 K/uL (ref 0.1–1.0)
Monocytes Relative: 13 %
Neutro Abs: 1.3 K/uL — ABNORMAL LOW (ref 1.7–7.7)
Neutrophils Relative %: 37 %
Platelet Count: 139 K/uL — ABNORMAL LOW (ref 150–400)
RBC: 4.89 MIL/uL (ref 4.22–5.81)
RDW: 15.9 % — ABNORMAL HIGH (ref 11.5–15.5)
WBC Count: 3.6 K/uL — ABNORMAL LOW (ref 4.0–10.5)
nRBC: 0 % (ref 0.0–0.2)

## 2023-11-22 NOTE — Progress Notes (Signed)
 Hematology/Oncology Progress note [phone] [phone]        CHIEF COMPLAINTS/REASON FOR VISIT:  Follow-up for multiple myeloma  ASSESSMENT & PLAN:   Cancer Staging  Multiple myeloma not having achieved remission (HCC) Staging form: Plasma Cell Myeloma and Plasma Cell Disorders, AJCC 8th Edition - Clinical stage from 02/04/2020: Beta-2-microglobulin (mg/L): 2.1, Albumin (g/dL): 2.7, ISS: Stage II, High-risk cytogenetics: Absent, LDH: Unknown - Signed by Babara Call, MD on 05/06/2020   Multiple myeloma not having achieved remission (HCC) #IgG multiple myeloma, stage II, del 1 p, high risk.  Declined bone marrow transplant evaluation. 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 Patient is on maintenance Revlimid  5mg  3 weeks on 1 week off [ dose reduced due to neutropenia]  Labs are reviewed and discussed with patient. Proceed next cycle of Revlimid-   Zometa Q 3 months - proceed today next due Oct 2025   Chemotherapy-induced neuropathy (HCC) Grade 1, stable symptoms.  Observation  Chronic deep vein thrombosis (DVT) (HCC) He has been on anticoagulation with Eliquis 5mg  BID- he has a high copay and did not qualify for assistance.  Continue Xarelto. 20mg  daily Patient declined repeat lower extremity ultrasound for follow-up of DVT   Encounter for antineoplastic chemotherapy Treatment as planned.  Hypocalcemia Recommend patient to take calcium 600mg  daily and Vitamin D 1000 units.   Tobacco use Recommend CT chest screening. He declined.     No orders of the defined types were placed in this encounter.  Follow up in 4 weeks   All questions were answered. The patient  knows to call the clinic with any problems, questions or concerns.  Call Babara, MD, PhD [facility] Hematology Oncology 11/22/2023     HISTORY OF PRESENTING ILLNESS:   Tommy Smith. is a  68 y.o.  male presents for follow-up of management of multiple myeloma Oncology history summary listed as below  Oncology History  Multiple myeloma not having achieved remission (HCC)  01/29/2020 Imaging   CT showed multiple bilateral pulmonary nodules measuring up to 10 mm in the right middle lobe, nodules are indeterminate, infectious/inflammatory etiology versus metastatic disease. Multiple osseous lytic lesions, largest lesion involving the left pubic bone concerning for metastatic disease.Compression fractures at T9, age indeterminate. Appearance focal area of thickening at the gastroesophageal junction.  Further evaluation with upper GI study or direct visualization with ndoscopy is recommended No bowel obstruction.Aortic atherosclerosis.   02/04/2020 Cancer Staging   Staging form: Plasma Cell Myeloma and Plasma Cell Disorders, AJCC 8th Edition - Clinical stage from 02/04/2020: Beta-2-microglobulin (mg/L): 2.1, Albumin (g/dL): 2.7, ISS: Stage II, High-risk cytogenetics: Absent, LDH: Unknown - Signed by Babara Call, MD on 05/06/2020   02/21/2020 Initial Diagnosis   Multiple myeloma  Baseline M protein 4.1, kappa light chain level 333.8 Beta-2 microglobulin 2.1, albumin 2.7.  -02/12/2020, bone marrow biopsy showed hypercellular for age, 80% plasma cell which are complex restricted by light chain in situ heparinization.  Background hematopoiesis is present but reduced. Cytogenetics is normal, Myeloma FISH panel-deletion 1P,Standard risk.   02/27/2020 - 05/06/2021 Chemotherapy   VRd     09/01/2020 Bone Marrow Biopsy   Bone marrow results were reviewed.  1% plasma cell.  Normal cytogenetics.  Myeloma FISH negative for 1p  del.   04/21/2021 Bone Marrow Biopsy   Bone marrow after 14 cycles of VRD  -[January 2023 ]showed plasma cells 2%.-   06/25/2021 Bone Marrow Transplant   Patient underwent autologous bone marrow transplant at [facility]   09/2021 - 11/10/2022 Chemotherapy   Revlimid 10 mg 3 weeks on 1 week off   10/26/2021 Imaging   Bilateral lower extremity venous US  Partially occlusive right deep vein thrombus extending from the calf veins into the common femoral vein with limited examination of the left extremity demonstrating partially occlusive thrombus in the common femoral vein.     01/13/2022 Imaging   Bilateral lower extremity venous US  1. 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 2. Extensive predominantly occlusive/near occlusive left lower extremity DVT extending from the left common femoral vein through the imaged tibial veins, age-indeterminate in the absence of prior examinations though echogenic thrombus within the left femoral vein suggests a chronic etiology.     01/20/2022 Bone Marrow Biopsy   -Bone marrow biopsy at [facility] showed Cellular bone marrow aspirate with trilineage hematopoiesis and no morphologic evidence of plasma cell neoplasm.  -Negative minimal residual disease.   12/03/2022 -  Chemotherapy   Revlimid 5mg  3 weeks on, 1 week off.     #Focal area of thickening at the GE junction. 10/23/2020 EGD is normal.   #Lung nodules, measures up to 10 mm on the right middle lobe.  Indeterminate.  Attention on follow-up.  Recommend repeat CT chest and he declined  10/28/2011, unilateral right lower extremity ultrasound showed partially occlusive right deep vein thrombosis extending from the calf vein into the, femoral vein. Patient was recommended to start on Eliquis for anticoagulation.  He tolerates well.  Right  thigh pain has improved.  INTERVAL HISTORY Vick Filter. is a 68 y.o. male who has above history reviewed by me today presents for follow up visit for multiple myeloma Current everyday smoker, 10 cigarettes daily.   Patient denies fever chills.  No new complaints. Takes calcium supplements intermittently.   Review of Systems  Constitutional:  Negative for appetite change, chills, fatigue, fever and unexpected weight change.  HENT:   Negative for hearing loss and voice change.   Eyes:  Negative for eye problems and icterus.  Respiratory:  Negative for chest tightness, cough and shortness of breath.   Cardiovascular:  Negative for chest pain and leg swelling.  Gastrointestinal:  Negative for abdominal distention, abdominal pain and constipation.  Endocrine: Negative for hot flashes.  Genitourinary:  Negative for difficulty urinating, dysuria and frequency.   Musculoskeletal:  Negative for arthralgias, back pain and neck pain.  Skin:  Negative for itching and rash.  Neurological:  Positive for numbness. Negative for light-headedness.  Hematological:  Negative for adenopathy. Does not bruise/bleed easily.  Psychiatric/Behavioral:  Negative for confusion.      MEDICAL HISTORY:  Past Medical History:  Diagnosis Date   Cancer (HCC)    Depression    Hypercalcemia 03/02/2020   Hyperlipemia    Hypertension    Hypogonadism in male    Multiple myeloma [facility])     SURGICAL HISTORY: Past Surgical History:  Procedure Laterality Date   BONE MARROW BIOPSY     COLONOSCOPY  07/14/2008   ESOPHAGOGASTRODUODENOSCOPY N/A 10/22/2020   Procedure: ESOPHAGOGASTRODUODENOSCOPY (EGD);  Surgeon: Therisa Bi,  MD;  Location: [facility] ENDOSCOPY;  Service: Gastroenterology;  Laterality: N/A;    SOCIAL HISTORY: Social History   Socioeconomic History   Marital status: Married    Spouse name: Delphine   Number of children: 3   Years of education: Not on file   Highest education level: Not on file   Occupational History   Not on file  Tobacco Use   Smoking status: Every Day    Current packs/day: 0.50    Average packs/day: 0.5 packs/day for 30.0 years (15.0 ttl pk-yrs)    Types: Cigarettes   Smokeless tobacco: Never  Substance and Sexual Activity   Alcohol use: Yes    Comment: occcassional   Drug use: No   Sexual activity: Not on file  Other Topics Concern   Not on file  Social History Narrative   Not on file   Social Drivers of Health   Financial Resource Strain: Low Risk  (06/23/2023)   Received from [facility]   Overall Financial Resource Strain (CARDIA)    Difficulty of Paying Living Expenses: Not hard at all  Food Insecurity: No Food Insecurity (06/23/2023)   Received from [facility]   Hunger Vital Sign    Within the past 12 months, you worried that your food would run out before you got the money to buy more.: Never true    Within the past 12 months, the food you bought just didn't last and you didn't have money to get more.: Never true  Transportation Needs: No Transportation Needs (06/23/2023)   Received from [facility] - Transportation    In the past 12 months, has lack of transportation kept you from medical appointments or from getting medications?: No    Lack of Transportation (Non-Medical): No  Physical Activity: Not on file  Stress: Not on file  Social Connections: Not on file  Intimate Partner Violence: Not At Risk (11/10/2022)   Humiliation, Afraid, Rape, and Kick questionnaire    Fear of Current or Ex-Partner: No    Emotionally Abused: No    Physically Abused: No    Sexually Abused: No    FAMILY HISTORY: Family History  Problem Relation Age of Onset   Skin cancer Mother    Prostate cancer Father     ALLERGIES:  has no known allergies.  MEDICATIONS:  Current Outpatient Medications  Medication Sig Dispense Refill   buPROPion  (WELLBUTRIN  XL) 300 MG 24 hr tablet Take 300 mg by  mouth daily.     cholecalciferol (VITAMIN D3) 25 MCG (1000 UNIT) tablet Take 2,000 Units by mouth daily.     lenalidomide  (REVLIMID ) 5 MG capsule Take 1 capsule (5 mg total) by mouth daily. Take 1 capsule by mouth once daily for 21 days, then 7 days off. 21 capsule 0   rivaroxaban  (XARELTO ) 20 MG TABS tablet Take 1 tablet (20 mg total) by mouth daily with supper. 30 tablet 5   No current facility-administered medications for this visit.     PHYSICAL EXAMINATION: ECOG PERFORMANCE STATUS: 1 - Symptomatic but completely ambulatory Vitals:   11/22/23 1312  BP: (!) 134/92  Pulse: 77  Resp: 16  Temp: (!) 97.2 F (36.2 C)  SpO2: 98%    Filed Weights   11/22/23 1312  Weight: 255 lb (115.7 kg)     Physical Exam Constitutional:      General: He is not in acute distress.    Appearance: He is not diaphoretic.  HENT:  Head: Normocephalic and atraumatic.     Nose: Nose normal.     Mouth/Throat:     Pharynx: No oropharyngeal exudate.  Eyes:     General: No scleral icterus.    Pupils: Pupils are equal, round, and reactive to light.  Cardiovascular:     Rate and Rhythm: Normal rate.  Pulmonary:     Effort: Pulmonary effort is normal. No respiratory distress.     Breath sounds: No wheezing.  Abdominal:     General: There is no distension.     Palpations: Abdomen is soft.     Tenderness: There is no abdominal tenderness.  Musculoskeletal:        General: Normal range of motion.     Cervical back: Normal range of motion.     Left lower leg: Edema present.  Skin:    General: Skin is warm and dry.     Findings: No erythema.  Neurological:     Mental Status: He is alert and oriented to person, place, and time. Mental status is at baseline.     Cranial Nerves: No cranial nerve deficit.     Motor: No abnormal muscle tone.  Psychiatric:        Mood and Affect: Mood and affect normal.       LABORATORY DATA:  I have reviewed the data as listed    Latest Ref Rng & Units  10/20/2023   11:15 AM 09/20/2023   10:31 AM 08/15/2023   10:10 AM  CBC  WBC 4.0 - 10.5 K/uL 3.0  3.4  3.0   Hemoglobin 13.0 - 17.0 g/dL 85.7  85.5  85.1   Hematocrit 39.0 - 52.0 % 44.0  43.6  45.4   Platelets 150 - 400 K/uL 158  149  144       Latest Ref Rng & Units 10/20/2023   11:15 AM 09/20/2023   10:31 AM 08/15/2023   10:10 AM  CMP  Glucose 70 - 99 mg/dL 92  96  93   BUN 8 - 23 mg/dL 12  12  15    Creatinine 0.61 - 1.24 mg/dL 8.82  8.76  8.98   Sodium 135 - 145 mmol/L 135  137  137   Potassium 3.5 - 5.1 mmol/L 4.3  4.1  3.9   Chloride 98 - 111 mmol/L 106  106  105   CO2 22 - 32 mmol/L 23  25  23    Calcium  8.9 - 10.3 mg/dL 8.7  8.6  9.1   Total Protein 6.5 - 8.1 g/dL 6.6  6.7  7.1   Total Bilirubin 0.0 - 1.2 mg/dL 1.0  1.1  1.1   Alkaline Phos 38 - 126 U/L 46  45  46   AST 15 - 41 U/L 18  19  18    ALT 0 - 44 U/L 20  20  20      RADIOGRAPHIC STUDIES: I have personally reviewed the radiological images as listed and agreed with the findings in the report. No results found.

## 2023-11-22 NOTE — Assessment & Plan Note (Signed)
 Recommend patient to take calcium 600mg  daily and Vitamin D 1000 units.

## 2023-11-22 NOTE — Assessment & Plan Note (Signed)
Treatment as planned 

## 2023-11-22 NOTE — Assessment & Plan Note (Signed)
 #  IgG multiple myeloma, stage II, del 1 p, high risk.  Declined bone marrow transplant evaluation. 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 Patient is on maintenance Revlimid   5mg  3 weeks on 1 week off [ dose reduced due to neutropenia]  Labs are reviewed and discussed with patient. Proceed next cycle of Revlimid -   Zometa  Q 3 months - proceed today next due Oct 2025

## 2023-11-22 NOTE — Assessment & Plan Note (Signed)
 Recommend CT chest screening. He declined.

## 2023-11-22 NOTE — Assessment & Plan Note (Signed)
 He has been on anticoagulation with Eliquis  5mg  BID- he has a high copay and did not qualify for assistance.  Continue Xarelto . 20mg  daily Patient declined repeat lower extremity ultrasound for follow-up of DVT

## 2023-11-22 NOTE — Assessment & Plan Note (Signed)
 Grade 1, stable symptoms.  Observation

## 2023-11-23 LAB — KAPPA/LAMBDA LIGHT CHAINS
Kappa free light chain: 16.6 mg/L (ref 3.3–19.4)
Kappa, lambda light chain ratio: 1.15 (ref 0.26–1.65)
Lambda free light chains: 14.4 mg/L (ref 5.7–26.3)

## 2023-11-24 LAB — MULTIPLE MYELOMA PANEL, SERUM
Albumin SerPl Elph-Mcnc: 3.7 g/dL (ref 2.9–4.4)
Albumin/Glob SerPl: 1.3 (ref 0.7–1.7)
Alpha 1: 0.2 g/dL (ref 0.0–0.4)
Alpha2 Glob SerPl Elph-Mcnc: 0.7 g/dL (ref 0.4–1.0)
B-Globulin SerPl Elph-Mcnc: 1.3 g/dL (ref 0.7–1.3)
Gamma Glob SerPl Elph-Mcnc: 0.7 g/dL (ref 0.4–1.8)
Globulin, Total: 3 g/dL (ref 2.2–3.9)
IgA: 369 mg/dL (ref 61–437)
IgG (Immunoglobin G), Serum: 855 mg/dL (ref 603–1613)
IgM (Immunoglobulin M), Srm: 11 mg/dL — ABNORMAL LOW (ref 20–172)
Total Protein ELP: 6.7 g/dL (ref 6.0–8.5)

## 2023-12-08 ENCOUNTER — Telehealth: Payer: Self-pay | Admitting: *Deleted

## 2023-12-12 ENCOUNTER — Encounter: Payer: Self-pay | Admitting: Oncology

## 2023-12-12 ENCOUNTER — Other Ambulatory Visit: Payer: Self-pay

## 2023-12-12 DIAGNOSIS — C9000 Multiple myeloma not having achieved remission: Secondary | ICD-10-CM

## 2023-12-12 MED ORDER — LENALIDOMIDE 5 MG PO CAPS
5.0000 mg | ORAL_CAPSULE | Freq: Every day | ORAL | 0 refills | Status: DC
Start: 2023-12-12 — End: 2024-01-09

## 2023-12-12 NOTE — Telephone Encounter (Signed)
 Lenalidomide  5mg  refill sent

## 2023-12-21 ENCOUNTER — Inpatient Hospital Stay: Admitting: Oncology

## 2023-12-21 ENCOUNTER — Encounter: Payer: Self-pay | Admitting: Oncology

## 2023-12-21 ENCOUNTER — Inpatient Hospital Stay: Attending: Oncology

## 2023-12-21 ENCOUNTER — Inpatient Hospital Stay

## 2023-12-21 VITALS — BP 139/94 | Temp 98.1°F | Resp 18 | Wt 257.1 lb

## 2023-12-21 DIAGNOSIS — D702 Other drug-induced agranulocytosis: Secondary | ICD-10-CM

## 2023-12-21 DIAGNOSIS — T451X5A Adverse effect of antineoplastic and immunosuppressive drugs, initial encounter: Secondary | ICD-10-CM

## 2023-12-21 DIAGNOSIS — Z23 Encounter for immunization: Secondary | ICD-10-CM

## 2023-12-21 DIAGNOSIS — G620 Drug-induced polyneuropathy: Secondary | ICD-10-CM

## 2023-12-21 DIAGNOSIS — Z720 Tobacco use: Secondary | ICD-10-CM

## 2023-12-21 DIAGNOSIS — C9000 Multiple myeloma not having achieved remission: Secondary | ICD-10-CM | POA: Diagnosis not present

## 2023-12-21 DIAGNOSIS — I825Y1 Chronic embolism and thrombosis of unspecified deep veins of right proximal lower extremity: Secondary | ICD-10-CM

## 2023-12-21 DIAGNOSIS — Z5111 Encounter for antineoplastic chemotherapy: Secondary | ICD-10-CM

## 2023-12-21 LAB — CMP (CANCER CENTER ONLY)
ALT: 19 U/L (ref 0–44)
AST: 20 U/L (ref 15–41)
Albumin: 3.8 g/dL (ref 3.5–5.0)
Alkaline Phosphatase: 51 U/L (ref 38–126)
Anion gap: 10 (ref 5–15)
BUN: 12 mg/dL (ref 8–23)
CO2: 22 mmol/L (ref 22–32)
Calcium: 8.8 mg/dL — ABNORMAL LOW (ref 8.9–10.3)
Chloride: 103 mmol/L (ref 98–111)
Creatinine: 1.21 mg/dL (ref 0.61–1.24)
GFR, Estimated: >60 mL/min (ref >60)
Glucose, Bld: 127 mg/dL — ABNORMAL HIGH (ref 70–99)
Potassium: 3.6 mmol/L (ref 3.5–5.1)
Sodium: 135 mmol/L (ref 135–145)
Total Bilirubin: 1.3 mg/dL — ABNORMAL HIGH (ref 0.0–1.2)
Total Protein: 6.7 g/dL (ref 6.5–8.1)

## 2023-12-21 LAB — CBC WITH DIFFERENTIAL (CANCER CENTER ONLY)
Abs Immature Granulocytes: 0 K/uL (ref 0.00–0.07)
Basophils Absolute: 0 K/uL (ref 0.0–0.1)
Basophils Relative: 1 %
Eosinophils Absolute: 0.1 K/uL (ref 0.0–0.5)
Eosinophils Relative: 4 %
HCT: 42.3 % (ref 39.0–52.0)
Hemoglobin: 13.9 g/dL (ref 13.0–17.0)
Immature Granulocytes: 0 %
Lymphocytes Relative: 46 %
Lymphs Abs: 1.5 K/uL (ref 0.7–4.0)
MCH: 30.4 pg (ref 26.0–34.0)
MCHC: 32.9 g/dL (ref 30.0–36.0)
MCV: 92.6 fL (ref 80.0–100.0)
Monocytes Absolute: 0.2 K/uL (ref 0.1–1.0)
Monocytes Relative: 7 %
Neutro Abs: 1.3 K/uL — ABNORMAL LOW (ref 1.7–7.7)
Neutrophils Relative %: 42 %
Platelet Count: 158 K/uL (ref 150–400)
RBC: 4.57 MIL/uL (ref 4.22–5.81)
RDW: 15.5 % (ref 11.5–15.5)
WBC Count: 3.2 K/uL — ABNORMAL LOW (ref 4.0–10.5)
nRBC: 0 % (ref 0.0–0.2)

## 2023-12-21 MED ORDER — INFLUENZA VAC SPLIT HIGH-DOSE 0.5 ML IM SUSY
0.5000 mL | PREFILLED_SYRINGE | Freq: Once | INTRAMUSCULAR | Status: AC
  Administered 2023-12-21: 0.5 mL via INTRAMUSCULAR
  Filled 2023-12-21: qty 0.5

## 2023-12-21 NOTE — Assessment & Plan Note (Signed)
Influenza vaccination today 

## 2023-12-21 NOTE — Assessment & Plan Note (Signed)
 Grade 1, stable symptoms.  Observation

## 2023-12-21 NOTE — Assessment & Plan Note (Signed)
 He has been on anticoagulation with Eliquis  5mg  BID- he has a high copay and did not qualify for assistance.  Continue Xarelto . 20mg  daily Patient declined repeat lower extremity ultrasound for follow-up of DVT

## 2023-12-21 NOTE — Assessment & Plan Note (Signed)
 Recommend CT chest screening. He declined.

## 2023-12-21 NOTE — Assessment & Plan Note (Signed)
 #  IgG multiple myeloma, stage II, del 1 p, high risk.  Declined bone marrow transplant evaluation. 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 Patient is on maintenance Revlimid   5mg  3 weeks on 1 week off [ dose reduced due to neutropenia]  Labs are reviewed and discussed with patient. Proceed next cycle of Revlimid -   Zometa  Q 3 months - proceed today next due Oct 2025

## 2023-12-21 NOTE — Assessment & Plan Note (Signed)
Stable ANC. monitor

## 2023-12-21 NOTE — Progress Notes (Signed)
 Hematology/Oncology Progress note [phone] [phone]     CHIEF COMPLAINTS/REASON FOR VISIT:  Follow-up for multiple myeloma  ASSESSMENT & PLAN:   Cancer Staging  Multiple myeloma not having achieved remission (HCC) Staging form: Plasma Cell Myeloma and Plasma Cell Disorders, AJCC 8th Edition - Clinical stage from 02/04/2020: Beta-2 -microglobulin (mg/L): 2.1, Albumin (g/dL): 2.7, ISS: Stage II, High-risk cytogenetics: Absent, LDH: Unknown - Signed by Tommy Call, MD on 05/06/2020   Multiple myeloma not having achieved remission (HCC) #IgG multiple myeloma, stage II, del 1 p, high risk.  Declined bone marrow transplant evaluation. 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 Patient is on maintenance Revlimid   5mg  3 weeks on 1 week off [ dose reduced due to neutropenia]  Labs are reviewed and discussed with patient. Proceed next cycle of Revlimid -   Zometa  Q 3 months - proceed today next due Oct 2025   Chemotherapy-induced neuropathy (HCC) Grade 1, stable symptoms.  Observation  Chronic deep vein thrombosis (DVT) (HCC) He has been on anticoagulation with Eliquis  5mg  BID- he has a high copay and did not qualify for assistance.  Continue Xarelto . 20mg  daily Patient declined repeat lower extremity ultrasound for follow-up of DVT   Drug-induced neutropenia (HCC) Stable ANC. monitor   Encounter for antineoplastic chemotherapy Treatment as planned.  Tobacco use Recommend CT chest screening. He declined.   Need for prophylactic vaccination and inoculation against influenza Influenza vaccination today    Orders Placed This Encounter  Procedures   CBC with Differential  (Cancer Center Only)    Standing Status:   Future    Expected Date:   01/18/2024    Expiration Date:   04/17/2024   CMP (Cancer Center only)    Standing Status:   Future    Expected Date:   01/18/2024    Expiration Date:   04/17/2024   Multiple Myeloma Panel (SPEP&IFE w/QIG)    Standing Status:   Future    Expected Date:   01/18/2024    Expiration Date:   04/17/2024   Kappa/lambda light chains    Standing Status:   Future    Expected Date:   01/18/2024    Expiration Date:   04/17/2024   Follow up in 4 weeks   All questions were answered. The patient knows to Smith the clinic with any problems, questions or concerns.  Smith Babara, MD, PhD [facility] Hematology Oncology 12/21/2023     HISTORY OF PRESENTING ILLNESS:   Tommy Smith. is a  68 y.o.  male presents for follow-up of management of multiple myeloma Oncology history summary listed as below  Oncology History  Multiple myeloma not having achieved remission (HCC)  01/29/2020 Imaging   CT showed multiple bilateral pulmonary nodules measuring up to 10 mm in the right middle lobe, nodules are indeterminate, infectious/inflammatory etiology versus metastatic disease. Multiple osseous lytic lesions, largest lesion involving the left pubic bone concerning for metastatic disease.Compression fractures at T9, age indeterminate. Appearance focal area of thickening at the gastroesophageal junction.  Further evaluation with upper GI study or direct visualization with ndoscopy is recommended No bowel obstruction.Aortic atherosclerosis.   02/04/2020 Cancer Staging   Staging form: Plasma Cell Myeloma and Plasma Cell Disorders, AJCC 8th Edition - Clinical stage from 02/04/2020:  Beta-2 -microglobulin (mg/L): 2.1, Albumin (g/dL): 2.7, ISS: Stage II, High-risk cytogenetics: Absent, LDH: Unknown - Signed by Tommy Call, MD on 05/06/2020   02/21/2020 Initial Diagnosis   Multiple myeloma  Baseline M protein 4.1, kappa light chain level 333.8 Beta-2   microglobulin 2.1, albumin 2.7.  -02/12/2020, bone marrow biopsy showed hypercellular for age, 80% plasma cell which are complex restricted by light chain in situ heparinization.  Background hematopoiesis is present but reduced. Cytogenetics is normal, Myeloma FISH panel-deletion 1P,Standard risk.   02/27/2020 - 05/06/2021 Chemotherapy   VRd     09/01/2020 Bone Marrow Biopsy   Bone marrow results were reviewed.  1% plasma cell.  Normal cytogenetics.  Myeloma FISH negative for 1p del.   04/21/2021 Bone Marrow Biopsy   Bone marrow after 14 cycles of VRD -[January 2023 ]showed plasma cells 2%.-   06/25/2021 Bone Marrow Transplant   Patient underwent autologous bone marrow transplant at [facility]   09/2021 - 11/10/2022 Chemotherapy   Revlimid  10 mg 3 weeks on 1 week off   10/26/2021 Imaging   Bilateral lower extremity venous US  Partially occlusive right deep vein thrombus extending from the calf veins into the common femoral vein with limited examination of the left extremity demonstrating partially occlusive thrombus in the common femoral vein.     01/13/2022 Imaging   Bilateral lower extremity venous US  1. 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 2. Extensive predominantly occlusive/near occlusive left lower extremity DVT extending from the left common femoral vein through the imaged tibial veins, age-indeterminate in the absence of prior examinations though echogenic thrombus within the left femoral vein suggests a chronic etiology.     01/20/2022 Bone Marrow Biopsy   -Bone marrow biopsy at [facility] showed Cellular bone marrow aspirate with trilineage hematopoiesis and no morphologic evidence of plasma cell neoplasm.  -Negative minimal  residual disease.   12/03/2022 -  Chemotherapy   Revlimid  5mg  3 weeks on, 1 week off.     #Focal area of thickening at the GE junction. 10/23/2020 EGD is normal.   #Lung nodules, measures up to 10 mm on the right middle lobe.  Indeterminate.  Attention on follow-up.  Recommend repeat CT chest and he declined  10/28/2011, unilateral right lower extremity ultrasound showed partially occlusive right deep vein thrombosis extending from the calf vein into the, femoral vein. Patient was recommended to start on Eliquis  for anticoagulation.  He tolerates well.  Right thigh pain has improved.  INTERVAL HISTORY Tommy Smith. is a 68 y.o. male who has above history reviewed by me today presents for follow up visit for multiple myeloma Current everyday smoker, 10 cigarettes daily.   Patient denies fever chills.  No new complaints. Takes calcium  supplements intermittently.   Review of Systems  Constitutional:  Negative for appetite change, chills, fatigue, fever and unexpected weight change.  HENT:   Negative for hearing loss and voice change.   Eyes:  Negative for eye problems and icterus.  Respiratory:  Negative for chest tightness, cough and shortness of breath.   Cardiovascular:  Negative for chest pain and leg swelling.  Gastrointestinal:  Negative for abdominal distention, abdominal pain and constipation.  Endocrine: Negative for hot flashes.  Genitourinary:  Negative for difficulty urinating, dysuria and frequency.   Musculoskeletal:  Negative for arthralgias, back pain and  neck pain.  Skin:  Negative for itching and rash.  Neurological:  Positive for numbness. Negative for light-headedness.  Hematological:  Negative for adenopathy. Does not bruise/bleed easily.  Psychiatric/Behavioral:  Negative for confusion.      MEDICAL HISTORY:  Past Medical History:  Diagnosis Date   Cancer (HCC)    Depression    Hypercalcemia 03/02/2020   Hyperlipemia    Hypertension    Hypogonadism in  male    Multiple myeloma [facility])     SURGICAL HISTORY: Past Surgical History:  Procedure Laterality Date   BONE MARROW BIOPSY     COLONOSCOPY  07/14/2008   ESOPHAGOGASTRODUODENOSCOPY N/A 10/22/2020   Procedure: ESOPHAGOGASTRODUODENOSCOPY (EGD);  Surgeon: Therisa Bi, MD;  [facility] ENDOSCOPY;  Service: Gastroenterology;  Laterality: N/A;    SOCIAL HISTORY: Social History   Socioeconomic History   Marital status: Married    Spouse name: Delphine   Number of children: 3   Years of education: Not on file   Highest education level: Not on file  Occupational History   Not on file  Tobacco Use   Smoking status: Every Day    Current packs/day: 0.50    Average packs/day: 0.5 packs/day for 30.0 years (15.0 ttl pk-yrs)    Types: Cigarettes   Smokeless tobacco: Never  Substance and Sexual Activity   Alcohol use: Yes    Comment: occcassional   Drug use: No   Sexual activity: Not on file  Other Topics Concern   Not on file  Social History Narrative   Not on file   Social Drivers of Health   Financial Resource Strain: Low Risk  (06/23/2023)   Received from [facility]   Overall Financial Resource Strain (CARDIA)    Difficulty of Paying Living Expenses: Not hard at all  Food Insecurity: No Food Insecurity (06/23/2023)   Received from [facility]   Hunger Vital Sign    Within the past 12 months, you worried that your food would run out before you got the money to buy more.: Never true    Within the past 12 months, the food you bought just didn't last and you didn't have money to get more.: Never true  Transportation Needs: No Transportation Needs (06/23/2023)   Received from [facility] - Transportation    In the past 12 months, has lack of transportation kept you from medical appointments or from getting medications?: No    Lack of Transportation (Non-Medical): No  Physical Activity: Not on file  Stress: Not on  file  Social Connections: Not on file  Intimate Partner Violence: Not At Risk (11/10/2022)   Humiliation, Afraid, Rape, and Kick questionnaire    Fear of Current or Ex-Partner: No    Emotionally Abused: No    Physically Abused: No    Sexually Abused: No    FAMILY HISTORY: Family History  Problem Relation Age of Onset   Skin cancer Mother    Prostate cancer Father     ALLERGIES:  has no known allergies.  MEDICATIONS:  Current Outpatient Medications  Medication Sig Dispense Refill   buPROPion  (WELLBUTRIN  XL) 300 MG 24 hr tablet Take 300 mg by mouth daily.     cholecalciferol (VITAMIN D3) 25 MCG (1000 UNIT) tablet Take 2,000 Units by mouth daily.     lenalidomide  (REVLIMID ) 5 MG capsule Take 1 capsule (5 mg total) by mouth daily. Take 1 capsule by mouth once daily for 21 days, then 7  days off. 21 capsule 0   rivaroxaban  (XARELTO ) 20 MG TABS tablet Take 1 tablet (20 mg total) by mouth daily with supper. 30 tablet 5   No current facility-administered medications for this visit.     PHYSICAL EXAMINATION: ECOG PERFORMANCE STATUS: 1 - Symptomatic but completely ambulatory Vitals:   12/21/23 1404  BP: (!) 139/94  Resp: 18  Temp: 98.1 F (36.7 C)    Filed Weights   12/21/23 1404  Weight: 257 lb 1.6 oz (116.6 kg)     Physical Exam Constitutional:      General: He is not in acute distress.    Appearance: He is not diaphoretic.  HENT:     Head: Normocephalic and atraumatic.     Nose: Nose normal.     Mouth/Throat:     Pharynx: No oropharyngeal exudate.  Eyes:     General: No scleral icterus.    Pupils: Pupils are equal, round, and reactive to light.  Cardiovascular:     Rate and Rhythm: Normal rate.  Pulmonary:     Effort: Pulmonary effort is normal. No respiratory distress.     Breath sounds: No wheezing.  Abdominal:     General: There is no distension.     Palpations: Abdomen is soft.     Tenderness: There is no abdominal tenderness.  Musculoskeletal:         General: Normal range of motion.     Cervical back: Normal range of motion.     Left lower leg: Edema present.  Skin:    General: Skin is warm and dry.     Findings: No erythema.  Neurological:     Mental Status: He is alert and oriented to person, place, and time. Mental status is at baseline.     Cranial Nerves: No cranial nerve deficit.     Motor: No abnormal muscle tone.  Psychiatric:        Mood and Affect: Mood and affect normal.       LABORATORY DATA:  I have reviewed the data as listed    Latest Ref Rng & Units 12/21/2023    1:45 PM 11/22/2023    1:02 PM 10/20/2023   11:15 AM  CBC  WBC 4.0 - 10.5 K/uL 3.2  3.6  3.0   Hemoglobin 13.0 - 17.0 g/dL 86.0  85.1  85.7   Hematocrit 39.0 - 52.0 % 42.3  45.2  44.0   Platelets 150 - 400 K/uL 158  139  158       Latest Ref Rng & Units 12/21/2023    1:45 PM 11/22/2023    1:02 PM 10/20/2023   11:15 AM  CMP  Glucose 70 - 99 mg/dL 872  90  92   BUN 8 - 23 mg/dL 12  14  12    Creatinine 0.61 - 1.24 mg/dL 8.78  8.82  8.82   Sodium 135 - 145 mmol/L 135  136  135   Potassium 3.5 - 5.1 mmol/L 3.6  4.3  4.3   Chloride 98 - 111 mmol/L 103  104  106   CO2 22 - 32 mmol/L 22  26  23    Calcium  8.9 - 10.3 mg/dL 8.8  9.5  8.7   Total Protein 6.5 - 8.1 g/dL 6.7  7.3  6.6   Total Bilirubin 0.0 - 1.2 mg/dL 1.3  1.4  1.0   Alkaline Phos 38 - 126 U/L 51  54  46   AST 15 - 41 U/L 20  19  18   ALT 0 - 44 U/L 19  19  20      RADIOGRAPHIC STUDIES: I have personally reviewed the radiological images as listed and agreed with the findings in the report. No results found.

## 2023-12-21 NOTE — Assessment & Plan Note (Signed)
Treatment as planned 

## 2023-12-22 LAB — KAPPA/LAMBDA LIGHT CHAINS
Kappa free light chain: 18.8 mg/L (ref 3.3–19.4)
Kappa, lambda light chain ratio: 1.18 (ref 0.26–1.65)
Lambda free light chains: 15.9 mg/L (ref 5.7–26.3)

## 2023-12-26 LAB — MULTIPLE MYELOMA PANEL, SERUM
Albumin SerPl Elph-Mcnc: 3.4 g/dL (ref 2.9–4.4)
Albumin/Glob SerPl: 1.2 (ref 0.7–1.7)
Alpha 1: 0.2 g/dL (ref 0.0–0.4)
Alpha2 Glob SerPl Elph-Mcnc: 0.7 g/dL (ref 0.4–1.0)
B-Globulin SerPl Elph-Mcnc: 1.2 g/dL (ref 0.7–1.3)
Gamma Glob SerPl Elph-Mcnc: 0.7 g/dL (ref 0.4–1.8)
Globulin, Total: 2.9 g/dL (ref 2.2–3.9)
IgA: 379 mg/dL (ref 61–437)
IgG (Immunoglobin G), Serum: 777 mg/dL (ref 603–1613)
IgM (Immunoglobulin M), Srm: 14 mg/dL — ABNORMAL LOW (ref 20–172)
Total Protein ELP: 6.3 g/dL (ref 6.0–8.5)

## 2024-01-09 ENCOUNTER — Other Ambulatory Visit: Payer: Self-pay

## 2024-01-09 ENCOUNTER — Other Ambulatory Visit: Payer: Self-pay | Admitting: Oncology

## 2024-01-09 ENCOUNTER — Telehealth: Payer: Self-pay | Admitting: *Deleted

## 2024-01-09 DIAGNOSIS — C9000 Multiple myeloma not having achieved remission: Secondary | ICD-10-CM

## 2024-01-09 NOTE — Telephone Encounter (Signed)
 Refill sent.

## 2024-01-22 ENCOUNTER — Inpatient Hospital Stay

## 2024-01-22 ENCOUNTER — Encounter: Payer: Self-pay | Admitting: Oncology

## 2024-01-22 ENCOUNTER — Inpatient Hospital Stay (HOSPITAL_BASED_OUTPATIENT_CLINIC_OR_DEPARTMENT_OTHER): Admitting: Oncology

## 2024-01-22 ENCOUNTER — Inpatient Hospital Stay: Attending: Oncology

## 2024-01-22 VITALS — BP 127/98 | HR 72 | Temp 98.4°F | Resp 18 | Wt 254.2 lb

## 2024-01-22 VITALS — BP 140/92 | HR 70 | Resp 18

## 2024-01-22 DIAGNOSIS — Z79899 Other long term (current) drug therapy: Secondary | ICD-10-CM | POA: Diagnosis not present

## 2024-01-22 DIAGNOSIS — C9000 Multiple myeloma not having achieved remission: Secondary | ICD-10-CM | POA: Diagnosis not present

## 2024-01-22 DIAGNOSIS — Z5111 Encounter for antineoplastic chemotherapy: Secondary | ICD-10-CM

## 2024-01-22 DIAGNOSIS — Z720 Tobacco use: Secondary | ICD-10-CM

## 2024-01-22 DIAGNOSIS — D702 Other drug-induced agranulocytosis: Secondary | ICD-10-CM

## 2024-01-22 DIAGNOSIS — Z9481 Bone marrow transplant status: Secondary | ICD-10-CM

## 2024-01-22 DIAGNOSIS — R7989 Other specified abnormal findings of blood chemistry: Secondary | ICD-10-CM | POA: Insufficient documentation

## 2024-01-22 DIAGNOSIS — I825Y1 Chronic embolism and thrombosis of unspecified deep veins of right proximal lower extremity: Secondary | ICD-10-CM

## 2024-01-22 LAB — CMP (CANCER CENTER ONLY)
ALT: 14 U/L (ref 0–44)
AST: 16 U/L (ref 15–41)
Albumin: 3.9 g/dL (ref 3.5–5.0)
Alkaline Phosphatase: 55 U/L (ref 38–126)
Anion gap: 8 (ref 5–15)
BUN: 13 mg/dL (ref 8–23)
CO2: 23 mmol/L (ref 22–32)
Calcium: 9.1 mg/dL (ref 8.9–10.3)
Chloride: 103 mmol/L (ref 98–111)
Creatinine: 1.39 mg/dL — ABNORMAL HIGH (ref 0.61–1.24)
GFR, Estimated: 55 mL/min — ABNORMAL LOW (ref >60)
Glucose, Bld: 86 mg/dL (ref 70–99)
Potassium: 4 mmol/L (ref 3.5–5.1)
Sodium: 134 mmol/L — ABNORMAL LOW (ref 135–145)
Total Bilirubin: 1.2 mg/dL (ref 0.0–1.2)
Total Protein: 7 g/dL (ref 6.5–8.1)

## 2024-01-22 LAB — CBC WITH DIFFERENTIAL (CANCER CENTER ONLY)
Abs Immature Granulocytes: 0.01 K/uL (ref 0.00–0.07)
Basophils Absolute: 0 K/uL (ref 0.0–0.1)
Basophils Relative: 1 %
Eosinophils Absolute: 0.1 K/uL (ref 0.0–0.5)
Eosinophils Relative: 4 %
HCT: 45.2 % (ref 39.0–52.0)
Hemoglobin: 15 g/dL (ref 13.0–17.0)
Immature Granulocytes: 0 %
Lymphocytes Relative: 39 %
Lymphs Abs: 1.5 K/uL (ref 0.7–4.0)
MCH: 30.4 pg (ref 26.0–34.0)
MCHC: 33.2 g/dL (ref 30.0–36.0)
MCV: 91.5 fL (ref 80.0–100.0)
Monocytes Absolute: 0.4 K/uL (ref 0.1–1.0)
Monocytes Relative: 10 %
Neutro Abs: 1.8 K/uL (ref 1.7–7.7)
Neutrophils Relative %: 46 %
Platelet Count: 153 K/uL (ref 150–400)
RBC: 4.94 MIL/uL (ref 4.22–5.81)
RDW: 15.8 % — ABNORMAL HIGH (ref 11.5–15.5)
WBC Count: 3.9 K/uL — ABNORMAL LOW (ref 4.0–10.5)
nRBC: 0 % (ref 0.0–0.2)

## 2024-01-22 MED ORDER — ZOLEDRONIC ACID 4 MG/5ML IV CONC
3.5000 mg | Freq: Once | INTRAVENOUS | Status: AC
  Administered 2024-01-22: 3.5 mg via INTRAVENOUS
  Filled 2024-01-22: qty 4.38

## 2024-01-22 MED ORDER — SODIUM CHLORIDE 0.9 % IV SOLN
INTRAVENOUS | Status: DC
  Filled 2024-01-22: qty 250

## 2024-01-22 NOTE — Progress Notes (Signed)
 Hematology/Oncology Progress note Telephone:([phone]) [phone] Fax:(336) [phone]     CHIEF COMPLAINTS/REASON FOR VISIT:  Follow-up for multiple myeloma  ASSESSMENT & PLAN:   Cancer Staging  Multiple myeloma not having achieved remission (HCC) Staging form: Plasma Cell Myeloma and Plasma Cell Disorders, AJCC 8th Edition - Clinical stage from 02/04/2020: Beta-2 -microglobulin (mg/L): 2.1, Albumin (g/dL): 2.7, ISS: Stage II, High-risk cytogenetics: Absent, LDH: Unknown - Signed by Babara Call, MD on 05/06/2020   Multiple myeloma not having achieved remission (HCC) #IgG multiple myeloma, stage II, del 1 p, high risk.  Declined bone marrow transplant evaluation. 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 Patient is on maintenance Revlimid   5mg  3 weeks on 1 week off [ dose reduced due to neutropenia]  Labs are reviewed and discussed with patient. Proceed next cycle of Revlimid -   Zometa  Q 3 months - proceed today next due Jan 2026  3.5mg  due to decreased GFR.   Encounter for antineoplastic chemotherapy Treatment as planned.  Hypocalcemia Recommend patient to take calcium  600mg  daily and Vitamin D 1000 units.   Tobacco use Recommend CT chest screening. He declined.   Chronic deep vein thrombosis (DVT) (HCC) He has been on anticoagulation with Eliquis  5mg  BID- he has a high copay and did not qualify for assistance.  Continue Xarelto . 20mg  daily Patient declined repeat lower extremity ultrasound for follow-up of DVT   Drug-induced neutropenia Stable ANC. monitor   History of bone marrow transplant [facility]) Patient follows up with oncology for post marrow transplant vaccination. Off acyclovir  400 mg twice  daily   Elevated serum creatinine Encourage oral hydration and avoid nephrotoxins.  Repeat BMP in 1 week    Orders Placed This Encounter  Procedures   Basic Metabolic Panel - Cancer Center Only    Standing Status:   Future    Expected Date:   01/29/2024    Expiration Date:   04/28/2024   CBC with Differential (Cancer Center Only)    Standing Status:   Future    Expected Date:   02/19/2024    Expiration Date:   05/19/2024   CMP (Cancer Center only)    Standing Status:   Future    Expected Date:   02/19/2024    Expiration Date:   05/19/2024   Multiple Myeloma Panel (SPEP&IFE w/QIG)    Standing Status:   Future    Expected Date:   02/19/2024    Expiration Date:   05/19/2024   Kappa/lambda light chains    Standing Status:   Future    Expected Date:   02/19/2024    Expiration Date:   05/19/2024   Follow up in 4 weeks   All questions were answered. The patient knows to call the clinic with any problems, questions or concerns.  Call Babara, MD, PhD [facility] Hematology Oncology 01/22/2024     HISTORY OF PRESENTING ILLNESS:   Tommy Sitar. is a  68 y.o.  male presents for follow-up of management of multiple myeloma Oncology history summary listed as below  Oncology History  Multiple myeloma not having achieved remission (HCC)  01/29/2020 Imaging   CT showed multiple bilateral pulmonary nodules measuring up to 10 mm in the right middle lobe, nodules are indeterminate, infectious/inflammatory etiology versus metastatic disease.  Multiple osseous lytic lesions, largest lesion involving the left pubic bone concerning for metastatic disease.Compression fractures at T9, age indeterminate. Appearance focal area of thickening at the gastroesophageal junction.  Further evaluation with upper GI study or direct visualization with ndoscopy is recommended No bowel obstruction.Aortic atherosclerosis.   02/04/2020 Cancer Staging   Staging form: Plasma Cell Myeloma and Plasma Cell Disorders,  AJCC 8th Edition - Clinical stage from 02/04/2020: Beta-2 -microglobulin (mg/L): 2.1, Albumin (g/dL): 2.7, ISS: Stage II, High-risk cytogenetics: Absent, LDH: Unknown - Signed by Babara Call, MD on 05/06/2020   02/21/2020 Initial Diagnosis   Multiple myeloma  Baseline M protein 4.1, kappa light chain level 333.8 Beta-2  microglobulin 2.1, albumin 2.7.  -02/12/2020, bone marrow biopsy showed hypercellular for age, 80% plasma cell which are complex restricted by light chain in situ heparinization.  Background hematopoiesis is present but reduced. Cytogenetics is normal, Myeloma FISH panel-deletion 1P,Standard risk.   02/27/2020 - 05/06/2021 Chemotherapy   VRd     09/01/2020 Bone Marrow Biopsy   Bone marrow results were reviewed.  1% plasma cell.  Normal cytogenetics.  Myeloma FISH negative for 1p del.   04/21/2021 Bone Marrow Biopsy   Bone marrow after 14 cycles of VRD -[January 2023 ]showed plasma cells 2%.-   06/25/2021 Bone Marrow Transplant   Patient underwent autologous bone marrow transplant at [facility]   09/2021 - 11/10/2022 Chemotherapy   Revlimid  10 mg 3 weeks on 1 week off   10/26/2021 Imaging   Bilateral lower extremity venous US  Partially occlusive right deep vein thrombus extending from the calf veins into the common femoral vein with limited examination of the left extremity demonstrating partially occlusive thrombus in the common femoral vein.     01/13/2022 Imaging   Bilateral lower extremity venous US  1. 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 2. Extensive predominantly occlusive/near occlusive left lower extremity DVT extending from the left common femoral vein through the imaged tibial veins, age-indeterminate in the  absence of prior examinations though echogenic thrombus within the left femoral vein suggests a chronic etiology.     01/20/2022 Bone Marrow Biopsy   -Bone marrow biopsy at [facility] showed Cellular bone marrow aspirate with trilineage hematopoiesis and no morphologic evidence of plasma cell neoplasm.  -Negative minimal residual disease.   12/03/2022 -  Chemotherapy   Revlimid  5mg  3 weeks on, 1 week off.     #Focal area of thickening at the GE junction. 10/23/2020 EGD is normal.   #Lung nodules, measures up to 10 mm on the right middle lobe.  Indeterminate.  Attention on follow-up.  Recommend repeat CT chest and he declined  10/28/2011, unilateral right lower extremity ultrasound showed partially occlusive right deep vein thrombosis extending from the calf vein into the, femoral vein. Patient was recommended to start on Eliquis  for anticoagulation.  He tolerates well.  Right thigh pain has improved.  INTERVAL HISTORY Jamarkus Lisbon. is a 68 y.o. male who has above history reviewed by me today presents for follow up visit for multiple myeloma Current everyday smoker, 10 cigarettes daily.   Patient denies fever chills.  No new complaints. Takes calcium  supplements intermittently.   Review of Systems  Constitutional:  Negative for appetite change, chills, fatigue, fever and unexpected weight change.  HENT:   Negative for hearing loss and voice  change.   Eyes:  Negative for eye problems and icterus.  Respiratory:  Negative for chest tightness, cough and shortness of breath.   Cardiovascular:  Negative for chest pain and leg swelling.  Gastrointestinal:  Negative for abdominal distention, abdominal pain and constipation.  Endocrine: Negative for hot flashes.  Genitourinary:  Negative for difficulty urinating, dysuria and frequency.   Musculoskeletal:  Negative for arthralgias, back pain and neck pain.  Skin:  Negative for itching and rash.  Neurological:  Positive for numbness. Negative for  light-headedness.  Hematological:  Negative for adenopathy. Does not bruise/bleed easily.  Psychiatric/Behavioral:  Negative for confusion.      MEDICAL HISTORY:  Past Medical History:  Diagnosis Date   Cancer (HCC)    Depression    Hypercalcemia 03/02/2020   Hyperlipemia    Hypertension    Hypogonadism in male    Multiple myeloma [facility])     SURGICAL HISTORY: Past Surgical History:  Procedure Laterality Date   BONE MARROW BIOPSY     COLONOSCOPY  07/14/2008   ESOPHAGOGASTRODUODENOSCOPY N/A 10/22/2020   Procedure: ESOPHAGOGASTRODUODENOSCOPY (EGD);  Surgeon: Therisa Bi, MD;  Location: [facility] ENDOSCOPY;  Service: Gastroenterology;  Laterality: N/A;    SOCIAL HISTORY: Social History   Socioeconomic History   Marital status: Married    Spouse name: Delphine   Number of children: 3   Years of education: Not on file   Highest education level: Not on file  Occupational History   Not on file  Tobacco Use   Smoking status: Every Day    Current packs/day: 0.50    Average packs/day: 0.5 packs/day for 30.0 years (15.0 ttl pk-yrs)    Types: Cigarettes   Smokeless tobacco: Never  Substance and Sexual Activity   Alcohol use: Yes    Comment: occcassional   Drug use: No   Sexual activity: Not on file  Other Topics Concern   Not on file  Social History Narrative   Not on file   Social Drivers of Health   Financial Resource Strain: Low Risk  (06/23/2023)   Received from [facility]   Overall Financial Resource Strain (CARDIA)    Difficulty of Paying Living Expenses: Not hard at all  Food Insecurity: No Food Insecurity (06/23/2023)   Received from [facility]   Hunger Vital Sign    Within the past 12 months, you worried that your food would run out before you got the money to buy more.: Never true    Within the past 12 months, the food you bought just didn't last and you didn't have money to get more.: Never true  Transportation Needs: No  Transportation Needs (06/23/2023)   Received from [facility] - Transportation    In the past 12 months, has lack of transportation kept you from medical appointments or from getting medications?: No    Lack of Transportation (Non-Medical): No  Physical Activity: Not on file  Stress: Not on file  Social Connections: Not on file  Intimate Partner Violence: Not At Risk (11/10/2022)   Humiliation, Afraid, Rape, and Kick questionnaire    Fear of Current or Ex-Partner: No    Emotionally Abused: No    Physically Abused: No    Sexually Abused: No    FAMILY HISTORY: Family History  Problem Relation Age of Onset   Skin cancer Mother    Prostate cancer Father     ALLERGIES:  has no known allergies.  MEDICATIONS:  Current  Outpatient Medications  Medication Sig Dispense Refill   buPROPion  (WELLBUTRIN  XL) 300 MG 24 hr tablet Take 300 mg by mouth daily.     cholecalciferol (VITAMIN D3) 25 MCG (1000 UNIT) tablet Take 2,000 Units by mouth daily.     rivaroxaban  (XARELTO ) 20 MG TABS tablet Take 1 tablet (20 mg total) by mouth daily with supper. 30 tablet 5   lenalidomide  (REVLIMID ) 5 MG capsule Take 1 capsule by mouth once daily for 21 days, then 7 days off. (Patient not taking: Reported on 01/22/2024) 21 capsule 0   No current facility-administered medications for this visit.   Facility-Administered Medications Ordered in Other Visits  Medication Dose Route Frequency Provider Last Rate Last Admin   0.9 %  sodium chloride  infusion   Intravenous Continuous Babara Call, MD   Stopped at 01/22/24 1420     PHYSICAL EXAMINATION: ECOG PERFORMANCE STATUS: 1 - Symptomatic but completely ambulatory Vitals:   01/22/24 1317 01/22/24 1321  BP: (!) 134/103 (!) 127/98  Pulse: 72   Resp: 18   Temp: 98.4 F (36.9 C)     Filed Weights   01/22/24 1317  Weight: 254 lb 3.2 oz (115.3 kg)     Physical Exam Constitutional:      General: He is not in acute distress.     Appearance: He is not diaphoretic.  HENT:     Head: Normocephalic and atraumatic.     Nose: Nose normal.     Mouth/Throat:     Pharynx: No oropharyngeal exudate.  Eyes:     General: No scleral icterus.    Pupils: Pupils are equal, round, and reactive to light.  Cardiovascular:     Rate and Rhythm: Normal rate.  Pulmonary:     Effort: Pulmonary effort is normal. No respiratory distress.     Breath sounds: No wheezing.  Abdominal:     General: There is no distension.     Palpations: Abdomen is soft.     Tenderness: There is no abdominal tenderness.  Musculoskeletal:        General: Normal range of motion.     Cervical back: Normal range of motion.     Left lower leg: Edema present.  Skin:    General: Skin is warm and dry.     Findings: No erythema.  Neurological:     Mental Status: He is alert and oriented to person, place, and time. Mental status is at baseline.     Cranial Nerves: No cranial nerve deficit.     Motor: No abnormal muscle tone.  Psychiatric:        Mood and Affect: Mood and affect normal.       LABORATORY DATA:  I have reviewed the data as listed    Latest Ref Rng & Units 01/22/2024   12:36 PM 12/21/2023    1:45 PM 11/22/2023    1:02 PM  CBC  WBC 4.0 - 10.5 K/uL 3.9  3.2  3.6   Hemoglobin 13.0 - 17.0 g/dL 84.9  86.0  85.1   Hematocrit 39.0 - 52.0 % 45.2  42.3  45.2   Platelets 150 - 400 K/uL 153  158  139       Latest Ref Rng & Units 01/22/2024   12:36 PM 12/21/2023    1:45 PM 11/22/2023    1:02 PM  CMP  Glucose 70 - 99 mg/dL 86  872  90   BUN 8 - 23 mg/dL 13  12  14    Creatinine  0.61 - 1.24 mg/dL 8.60  8.78  8.82   Sodium 135 - 145 mmol/L 134  135  136   Potassium 3.5 - 5.1 mmol/L 4.0  3.6  4.3   Chloride 98 - 111 mmol/L 103  103  104   CO2 22 - 32 mmol/L 23  22  26    Calcium  8.9 - 10.3 mg/dL 9.1  8.8  9.5   Total Protein 6.5 - 8.1 g/dL 7.0  6.7  7.3   Total Bilirubin 0.0 - 1.2 mg/dL 1.2  1.3  1.4   Alkaline Phos 38 - 126 U/L 55  51  54    AST 15 - 41 U/L 16  20  19    ALT 0 - 44 U/L 14  19  19      RADIOGRAPHIC STUDIES: I have personally reviewed the radiological images as listed and agreed with the findings in the report. No results found.

## 2024-01-22 NOTE — Assessment & Plan Note (Signed)
Treatment as planned 

## 2024-01-22 NOTE — Patient Instructions (Signed)

## 2024-01-22 NOTE — Assessment & Plan Note (Signed)
Patient follows up with oncology for post marrow transplant vaccination. Off acyclovir 400 mg twice daily

## 2024-01-22 NOTE — Assessment & Plan Note (Signed)
Stable ANC. monitor

## 2024-01-22 NOTE — Assessment & Plan Note (Addendum)
#  IgG multiple myeloma, stage II, del 1 p, high risk.  Declined bone marrow transplant evaluation. 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 Patient is on maintenance Revlimid   5mg  3 weeks on 1 week off [ dose reduced due to neutropenia]  Labs are reviewed and discussed with patient. Proceed next cycle of Revlimid -   Zometa  Q 3 months - proceed today next due Jan 2026  3.5mg  due to decreased GFR.

## 2024-01-22 NOTE — Assessment & Plan Note (Signed)
 He has been on anticoagulation with Eliquis  5mg  BID- he has a high copay and did not qualify for assistance.  Continue Xarelto . 20mg  daily Patient declined repeat lower extremity ultrasound for follow-up of DVT

## 2024-01-22 NOTE — Assessment & Plan Note (Signed)
 Recommend CT chest screening. He declined.

## 2024-01-22 NOTE — Assessment & Plan Note (Signed)
 Recommend patient to take calcium 600mg  daily and Vitamin D 1000 units.

## 2024-01-22 NOTE — Assessment & Plan Note (Signed)
Encourage oral hydration and avoid nephrotoxins.  Repeat BMP in 1 week.

## 2024-01-23 LAB — KAPPA/LAMBDA LIGHT CHAINS
Kappa free light chain: 18.7 mg/L (ref 3.3–19.4)
Kappa, lambda light chain ratio: 1.18 (ref 0.26–1.65)
Lambda free light chains: 15.8 mg/L (ref 5.7–26.3)

## 2024-01-29 ENCOUNTER — Inpatient Hospital Stay

## 2024-01-29 DIAGNOSIS — C9000 Multiple myeloma not having achieved remission: Secondary | ICD-10-CM | POA: Diagnosis not present

## 2024-01-29 LAB — MULTIPLE MYELOMA PANEL, SERUM
Albumin SerPl Elph-Mcnc: 3.6 g/dL (ref 2.9–4.4)
Albumin/Glob SerPl: 1.2 (ref 0.7–1.7)
Alpha 1: 0.2 g/dL (ref 0.0–0.4)
Alpha2 Glob SerPl Elph-Mcnc: 0.8 g/dL (ref 0.4–1.0)
B-Globulin SerPl Elph-Mcnc: 1.4 g/dL — ABNORMAL HIGH (ref 0.7–1.3)
Gamma Glob SerPl Elph-Mcnc: 0.7 g/dL (ref 0.4–1.8)
Globulin, Total: 3.1 g/dL (ref 2.2–3.9)
IgA: 406 mg/dL (ref 61–437)
IgG (Immunoglobin G), Serum: 848 mg/dL (ref 603–1613)
IgM (Immunoglobulin M), Srm: 15 mg/dL — ABNORMAL LOW (ref 20–172)
Total Protein ELP: 6.7 g/dL (ref 6.0–8.5)

## 2024-01-29 LAB — BASIC METABOLIC PANEL - CANCER CENTER ONLY
Anion gap: 9 (ref 5–15)
BUN: 10 mg/dL (ref 8–23)
CO2: 23 mmol/L (ref 22–32)
Calcium: 9 mg/dL (ref 8.9–10.3)
Chloride: 102 mmol/L (ref 98–111)
Creatinine: 1.21 mg/dL (ref 0.61–1.24)
GFR, Estimated: >60 mL/min (ref >60)
Glucose, Bld: 86 mg/dL (ref 70–99)
Potassium: 4.2 mmol/L (ref 3.5–5.1)
Sodium: 134 mmol/L — ABNORMAL LOW (ref 135–145)

## 2024-02-06 ENCOUNTER — Encounter: Payer: Self-pay | Admitting: Physician Assistant

## 2024-02-09 ENCOUNTER — Encounter: Payer: Self-pay | Admitting: Oncology

## 2024-02-09 NOTE — Telephone Encounter (Signed)
 Duplicate encounter

## 2024-02-12 ENCOUNTER — Other Ambulatory Visit: Payer: Self-pay

## 2024-02-12 DIAGNOSIS — C9000 Multiple myeloma not having achieved remission: Secondary | ICD-10-CM

## 2024-02-12 MED ORDER — LENALIDOMIDE 5 MG PO CAPS
5.0000 mg | ORAL_CAPSULE | Freq: Every day | ORAL | 0 refills | Status: DC
Start: 2024-02-12 — End: 2024-02-19

## 2024-02-19 MED ORDER — LENALIDOMIDE 5 MG PO CAPS: 5.0000 mg | ORAL_CAPSULE | Freq: Every day | ORAL | 0 refills | Status: DC

## 2024-02-19 NOTE — Telephone Encounter (Signed)
 New rx sent with cycle days of Take 1 capsule by mouth once daily for 21 days, then 7 days off.

## 2024-02-19 NOTE — Addendum Note (Signed)
 Addended by: CLAUDENE POWELL CROME on: 02/19/2024 09:16 AM   Modules accepted: Orders

## 2024-03-01 ENCOUNTER — Inpatient Hospital Stay: Attending: Oncology

## 2024-03-01 ENCOUNTER — Encounter: Payer: Self-pay | Admitting: Oncology

## 2024-03-01 ENCOUNTER — Inpatient Hospital Stay: Admitting: Oncology

## 2024-03-01 VITALS — BP 132/93 | HR 69 | Temp 97.3°F | Resp 18 | Ht 75.0 in | Wt 257.8 lb

## 2024-03-01 DIAGNOSIS — Z9481 Bone marrow transplant status: Secondary | ICD-10-CM

## 2024-03-01 DIAGNOSIS — Z720 Tobacco use: Secondary | ICD-10-CM

## 2024-03-01 DIAGNOSIS — Z5111 Encounter for antineoplastic chemotherapy: Secondary | ICD-10-CM | POA: Diagnosis not present

## 2024-03-01 DIAGNOSIS — C9000 Multiple myeloma not having achieved remission: Secondary | ICD-10-CM

## 2024-03-01 DIAGNOSIS — I825Y1 Chronic embolism and thrombosis of unspecified deep veins of right proximal lower extremity: Secondary | ICD-10-CM

## 2024-03-01 LAB — CBC WITH DIFFERENTIAL (CANCER CENTER ONLY)
Abs Immature Granulocytes: 0.01 K/uL (ref 0.00–0.07)
Basophils Absolute: 0 K/uL (ref 0.0–0.1)
Basophils Relative: 1 %
Eosinophils Absolute: 0.2 K/uL (ref 0.0–0.5)
Eosinophils Relative: 5 %
HCT: 45.5 % (ref 39.0–52.0)
Hemoglobin: 15.2 g/dL (ref 13.0–17.0)
Immature Granulocytes: 0 %
Lymphocytes Relative: 51 %
Lymphs Abs: 1.9 K/uL (ref 0.7–4.0)
MCH: 30.4 pg (ref 26.0–34.0)
MCHC: 33.4 g/dL (ref 30.0–36.0)
MCV: 91 fL (ref 80.0–100.0)
Monocytes Absolute: 0.4 K/uL (ref 0.1–1.0)
Monocytes Relative: 11 %
Neutro Abs: 1.2 K/uL — ABNORMAL LOW (ref 1.7–7.7)
Neutrophils Relative %: 32 %
Platelet Count: 162 K/uL (ref 150–400)
RBC: 5 MIL/uL (ref 4.22–5.81)
RDW: 15.8 % — ABNORMAL HIGH (ref 11.5–15.5)
WBC Count: 3.7 K/uL — ABNORMAL LOW (ref 4.0–10.5)
nRBC: 0 % (ref 0.0–0.2)

## 2024-03-01 LAB — CMP (CANCER CENTER ONLY)
ALT: 16 U/L (ref 0–44)
AST: 15 U/L (ref 15–41)
Albumin: 3.9 g/dL (ref 3.5–5.0)
Alkaline Phosphatase: 48 U/L (ref 38–126)
Anion gap: 8 (ref 5–15)
BUN: 13 mg/dL (ref 8–23)
CO2: 25 mmol/L (ref 22–32)
Calcium: 8.9 mg/dL (ref 8.9–10.3)
Chloride: 103 mmol/L (ref 98–111)
Creatinine: 1.22 mg/dL (ref 0.61–1.24)
GFR, Estimated: >60 mL/min (ref >60)
Glucose, Bld: 87 mg/dL (ref 70–99)
Potassium: 4.5 mmol/L (ref 3.5–5.1)
Sodium: 136 mmol/L (ref 135–145)
Total Bilirubin: 1 mg/dL (ref 0.0–1.2)
Total Protein: 7.1 g/dL (ref 6.5–8.1)

## 2024-03-01 NOTE — Assessment & Plan Note (Addendum)
#  IgG multiple myeloma, stage II, del 1 p, high risk.  Declined bone marrow transplant evaluation. 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 Patient is on maintenance Revlimid   5mg  3 weeks on 1 week off [ dose reduced due to neutropenia]  Labs are reviewed and discussed with patient. Proceed next cycle of Revlimid -  Patient prefers to see MD every 2-3 months. He agrees with getting cbc cmp done every month prior to starting next cycle of Revlimid .  Zometa  Q 3 months - proceed today next due Jan 2026  3.5mg  due to decreased GFR.

## 2024-03-01 NOTE — Assessment & Plan Note (Signed)
 Recommend patient to take calcium 600mg  daily and Vitamin D 1000 units.

## 2024-03-01 NOTE — Assessment & Plan Note (Signed)
 Recommend smoke cessation.  Previously he declined CT chest lung cancer screening.

## 2024-03-01 NOTE — Progress Notes (Signed)
 Pt saw doctors at [facility] for biopsy. Would like to discuss coming here every 3 months instead of every month.

## 2024-03-01 NOTE — Progress Notes (Signed)
 Hematology/Oncology Progress note [phone] [phone]     CHIEF COMPLAINTS/REASON FOR VISIT:  Follow-up for multiple myeloma  ASSESSMENT & PLAN:   Cancer Staging  Multiple myeloma not having achieved remission (HCC) Staging form: Plasma Cell Myeloma and Plasma Cell Disorders, AJCC 8th Edition - Clinical stage from 02/04/2020: Beta-2 -microglobulin (mg/L): 2.1, Albumin (g/dL): 2.7, ISS: Stage II, High-risk cytogenetics: Absent, LDH: Unknown - Signed by Babara Call, MD on 05/06/2020   Multiple myeloma not having achieved remission (HCC) #IgG multiple myeloma, stage II, del 1 p, high risk.  Declined bone marrow transplant evaluation. 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 Patient is on maintenance Revlimid   5mg  3 weeks on 1 week off [ dose reduced due to neutropenia]  Labs are reviewed and discussed with patient. Proceed next cycle of Revlimid -  Patient prefers to see MD every 2-3 months. He agrees with getting cbc cmp done every month prior to starting next cycle of Revlimid .  Zometa  Q 3 months - proceed today next due Jan 2026  3.5mg  due to decreased GFR.   Chronic deep vein thrombosis (DVT) (HCC) He has been on anticoagulation with Eliquis  5mg  BID- he has a high copay and did not qualify for assistance.  Continue Xarelto . 20mg  daily Patient declined repeat lower extremity ultrasound for follow-up of DVT   Encounter for antineoplastic chemotherapy Treatment as planned.  History of bone marrow transplant [facility]) Patient follows up with oncology for post marrow transplant vaccination. Off acyclovir  400 mg twice daily   Hypocalcemia Recommend patient to take calcium  600mg   daily and Vitamin D 1000 units.   Tobacco use Recommend smoke cessation.  Previously he declined CT chest lung cancer screening.      Orders Placed This Encounter  Procedures   CBC    Standing Status:   Future    Expected Date:   03/29/2024    Expiration Date:   06/27/2024   CMP (Cancer Center only)    Standing Status:   Future    Expected Date:   03/29/2024    Expiration Date:   06/27/2024   CBC (Cancer Center Only)    Standing Status:   Future    Expected Date:   04/26/2024    Expiration Date:   07/25/2024   CMP (Cancer Center only)    Standing Status:   Future    Expected Date:   04/26/2024    Expiration Date:   07/25/2024   Multiple Myeloma Panel (SPEP&IFE w/QIG)    Standing Status:   Future    Expected Date:   04/26/2024    Expiration Date:   07/25/2024   Kappa/lambda light chains    Standing Status:   Future    Expected Date:   04/26/2024    Expiration Date:   07/25/2024   Follow up in 4 weeks   All questions were answered. The patient knows to call the clinic with any problems, questions or concerns.  Call Babara, MD, PhD [facility] Hematology Oncology 03/01/2024     HISTORY OF PRESENTING ILLNESS:   Tommy Machuca. is a  68 y.o.  male presents for follow-up of management of multiple myeloma Oncology history summary listed as below  Oncology History  Multiple myeloma not having achieved remission (  HCC)  01/29/2020 Imaging   CT showed multiple bilateral pulmonary nodules measuring up to 10 mm in the right middle lobe, nodules are indeterminate, infectious/inflammatory etiology versus metastatic disease. Multiple osseous lytic lesions, largest lesion involving the left pubic bone concerning for metastatic disease.Compression fractures at T9, age indeterminate. Appearance focal area of thickening at the gastroesophageal junction.  Further evaluation with upper GI study or direct visualization with ndoscopy is recommended No bowel obstruction.Aortic atherosclerosis.    02/04/2020 Cancer Staging   Staging form: Plasma Cell Myeloma and Plasma Cell Disorders, AJCC 8th Edition - Clinical stage from 02/04/2020: Beta-2 -microglobulin (mg/L): 2.1, Albumin (g/dL): 2.7, ISS: Stage II, High-risk cytogenetics: Absent, LDH: Unknown - Signed by Babara Call, MD on 05/06/2020   02/21/2020 Initial Diagnosis   Multiple myeloma  Baseline M protein 4.1, kappa light chain level 333.8 Beta-2  microglobulin 2.1, albumin 2.7.  -02/12/2020, bone marrow biopsy showed hypercellular for age, 80% plasma cell which are complex restricted by light chain in situ heparinization.  Background hematopoiesis is present but reduced. Cytogenetics is normal, Myeloma FISH panel-deletion 1P,Standard risk.   02/27/2020 - 05/06/2021 Chemotherapy   VRd     09/01/2020 Bone Marrow Biopsy   Bone marrow results were reviewed.  1% plasma cell.  Normal cytogenetics.  Myeloma FISH negative for 1p del.   04/21/2021 Bone Marrow Biopsy   Bone marrow after 14 cycles of VRD -[January 2023 ]showed plasma cells 2%.-   06/25/2021 Bone Marrow Transplant   Patient underwent autologous bone marrow transplant at [facility]   09/2021 - 11/10/2022 Chemotherapy   Revlimid  10 mg 3 weeks on 1 week off   10/26/2021 Imaging   Bilateral lower extremity venous US  Partially occlusive right deep vein thrombus extending from the calf veins into the common femoral vein with limited examination of the left extremity demonstrating partially occlusive thrombus in the common femoral vein.     01/13/2022 Imaging   Bilateral lower extremity venous US  1. 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 2. Extensive predominantly occlusive/near occlusive left lower extremity DVT extending from  the left common femoral vein through the imaged tibial veins, age-indeterminate in the absence of prior examinations though echogenic thrombus within the left femoral vein suggests a chronic etiology.     01/20/2022 Bone Marrow Biopsy   -Bone marrow biopsy at [facility] showed Cellular bone marrow aspirate with trilineage hematopoiesis and no morphologic evidence of plasma cell neoplasm.  -Negative minimal residual disease.   12/03/2022 -  Chemotherapy   Revlimid  5mg  3 weeks on, 1 week off.     #Focal area of thickening at the GE junction. 10/23/2020 EGD is normal.   #Lung nodules, measures up to 10 mm on the right middle lobe.  Indeterminate.  Attention on follow-up.  Recommend repeat CT chest and he declined  10/28/2011, unilateral right lower extremity ultrasound showed partially occlusive right deep vein thrombosis extending from the calf vein into the, femoral vein. Patient was recommended to start on Eliquis  for anticoagulation.  He tolerates well.  Right thigh pain has improved.  INTERVAL HISTORY Tommy Smith. is a 68 y.o. male who has above history reviewed by me today presents for follow up visit for multiple myeloma Current everyday smoker, 10 cigarettes daily.   Patient denies fever chills.  No new complaints. Takes calcium  supplements  intermittently.   Review of Systems  Constitutional:  Negative for appetite change, chills, fatigue, fever and unexpected weight change.  HENT:   Negative for hearing loss and voice change.   Eyes:  Negative for eye problems and icterus.  Respiratory:  Negative for chest tightness, cough and shortness of breath.   Cardiovascular:  Negative for chest pain and leg swelling.  Gastrointestinal:  Negative for abdominal distention, abdominal pain and constipation.  Endocrine: Negative for hot flashes.  Genitourinary:  Negative for difficulty urinating, dysuria and frequency.   Musculoskeletal:  Negative for arthralgias, back pain and neck pain.   Skin:  Negative for itching and rash.  Neurological:  Positive for numbness. Negative for light-headedness.  Hematological:  Negative for adenopathy. Does not bruise/bleed easily.  Psychiatric/Behavioral:  Negative for confusion.      MEDICAL HISTORY:  Past Medical History:  Diagnosis Date   Cancer (HCC)    Depression    Hypercalcemia 03/02/2020   Hyperlipemia    Hypertension    Hypogonadism in male    Multiple myeloma [facility])     SURGICAL HISTORY: Past Surgical History:  Procedure Laterality Date   BONE MARROW BIOPSY     COLONOSCOPY  07/14/2008   ESOPHAGOGASTRODUODENOSCOPY N/A 10/22/2020   Procedure: ESOPHAGOGASTRODUODENOSCOPY (EGD);  Surgeon: Therisa Bi, MD;  Location: [facility] ENDOSCOPY;  Service: Gastroenterology;  Laterality: N/A;    SOCIAL HISTORY: Social History   Socioeconomic History   Marital status: Married    Spouse name: Delphine   Number of children: 3   Years of education: Not on file   Highest education level: Not on file  Occupational History   Not on file  Tobacco Use   Smoking status: Every Day    Current packs/day: 0.50    Average packs/day: 0.5 packs/day for 30.0 years (15.0 ttl pk-yrs)    Types: Cigarettes   Smokeless tobacco: Never  Substance and Sexual Activity   Alcohol use: Yes    Comment: occcassional   Drug use: No   Sexual activity: Not on file  Other Topics Concern   Not on file  Social History Narrative   Not on file   Social Drivers of Health   Financial Resource Strain: Low Risk  (06/23/2023)   Received from [facility]   Overall Financial Resource Strain (CARDIA)    Difficulty of Paying Living Expenses: Not hard at all  Food Insecurity: No Food Insecurity (06/23/2023)   Received from [facility]   Hunger Vital Sign    Within the past 12 months, you worried that your food would run out before you got the money to buy more.: Never true    Within the past 12 months, the food you bought just  didn't last and you didn't have money to get more.: Never true  Transportation Needs: No Transportation Needs (06/23/2023)   Received from [facility] - Transportation    In the past 12 months, has lack of transportation kept you from medical appointments or from getting medications?: No    Lack of Transportation (Non-Medical): No  Physical Activity: Not on file  Stress: Not on file  Social Connections: Not on file  Intimate Partner Violence: Not At Risk (11/10/2022)   Humiliation, Afraid, Rape, and Kick questionnaire    Fear of Current or Ex-Partner: No    Emotionally Abused: No    Physically Abused: No    Sexually Abused: No    FAMILY HISTORY: Family History  Problem Relation Age of Onset   Skin cancer Mother    Prostate cancer Father     ALLERGIES:  has no known allergies.  MEDICATIONS:  Current Outpatient Medications  Medication Sig Dispense Refill   buPROPion  (WELLBUTRIN  XL) 300 MG 24 hr tablet Take 300 mg by mouth daily.     cholecalciferol (VITAMIN D3) 25 MCG (1000 UNIT) tablet Take 2,000 Units by mouth daily.     lenalidomide  (REVLIMID ) 5 MG capsule Take 1 capsule (5 mg total) by mouth daily. Take 1 capsule by mouth once daily for 21 days, then 7 days off. 21 capsule 0   rivaroxaban  (XARELTO ) 20 MG TABS tablet Take 1 tablet (20 mg total) by mouth daily with supper. 30 tablet 5   No current facility-administered medications for this visit.     PHYSICAL EXAMINATION: ECOG PERFORMANCE STATUS: 1 - Symptomatic but completely ambulatory Vitals:   03/01/24 1111 03/01/24 1120  BP: (!) 137/97 (!) 132/93  Pulse: 69   Resp: 18   Temp: (!) 97.3 F (36.3 C)   SpO2: 99%     Filed Weights   03/01/24 1111  Weight: 257 lb 12.8 oz (116.9 kg)     Physical Exam Constitutional:      General: He is not in acute distress.    Appearance: He is not diaphoretic.  HENT:     Head: Normocephalic and atraumatic.     Nose: Nose normal.     Mouth/Throat:      Pharynx: No oropharyngeal exudate.  Eyes:     General: No scleral icterus.    Pupils: Pupils are equal, round, and reactive to light.  Cardiovascular:     Rate and Rhythm: Normal rate.  Pulmonary:     Effort: Pulmonary effort is normal. No respiratory distress.     Breath sounds: No wheezing.  Abdominal:     General: There is no distension.     Palpations: Abdomen is soft.     Tenderness: There is no abdominal tenderness.  Musculoskeletal:        General: Normal range of motion.     Cervical back: Normal range of motion.     Left lower leg: Edema present.  Skin:    General: Skin is warm and dry.     Findings: No erythema.  Neurological:     Mental Status: He is alert and oriented to person, place, and time. Mental status is at baseline.     Cranial Nerves: No cranial nerve deficit.     Motor: No abnormal muscle tone.  Psychiatric:        Mood and Affect: Mood and affect normal.       LABORATORY DATA:  I have reviewed the data as listed    Latest Ref Rng & Units 03/01/2024   11:02 AM 01/22/2024   12:36 PM 12/21/2023    1:45 PM  CBC  WBC 4.0 - 10.5 K/uL 3.7  3.9  3.2   Hemoglobin 13.0 - 17.0 g/dL 84.7  84.9  86.0   Hematocrit 39.0 - 52.0 % 45.5  45.2  42.3   Platelets 150 - 400 K/uL 162  153  158       Latest Ref Rng & Units 03/01/2024   11:02 AM 01/29/2024   12:34 PM 01/22/2024   12:36 PM  CMP  Glucose 70 - 99 mg/dL 87  86  86   BUN 8 - 23 mg/dL 13  10  13    Creatinine 0.61 - 1.24 mg/dL  1.22  1.21  1.39   Sodium 135 - 145 mmol/L 136  134  134   Potassium 3.5 - 5.1 mmol/L 4.5  4.2  4.0   Chloride 98 - 111 mmol/L 103  102  103   CO2 22 - 32 mmol/L 25  23  23    Calcium  8.9 - 10.3 mg/dL 8.9  9.0  9.1   Total Protein 6.5 - 8.1 g/dL 7.1   7.0   Total Bilirubin 0.0 - 1.2 mg/dL 1.0   1.2   Alkaline Phos 38 - 126 U/L 48   55   AST 15 - 41 U/L 15   16   ALT 0 - 44 U/L 16   14     RADIOGRAPHIC STUDIES: I have personally reviewed the radiological images as  listed and agreed with the findings in the report. No results found.

## 2024-03-01 NOTE — Assessment & Plan Note (Signed)
Patient follows up with oncology for post marrow transplant vaccination. Off acyclovir 400 mg twice daily

## 2024-03-01 NOTE — Assessment & Plan Note (Signed)
Treatment as planned 

## 2024-03-01 NOTE — Assessment & Plan Note (Signed)
 He has been on anticoagulation with Eliquis  5mg  BID- he has a high copay and did not qualify for assistance.  Continue Xarelto . 20mg  daily Patient declined repeat lower extremity ultrasound for follow-up of DVT

## 2024-03-04 LAB — MULTIPLE MYELOMA PANEL, SERUM
Albumin SerPl Elph-Mcnc: 3.4 g/dL (ref 2.9–4.4)
Albumin/Glob SerPl: 1.2 (ref 0.7–1.7)
Alpha 1: 0.2 g/dL (ref 0.0–0.4)
Alpha2 Glob SerPl Elph-Mcnc: 0.7 g/dL (ref 0.4–1.0)
B-Globulin SerPl Elph-Mcnc: 1.2 g/dL (ref 0.7–1.3)
Gamma Glob SerPl Elph-Mcnc: 0.8 g/dL (ref 0.4–1.8)
Globulin, Total: 3 g/dL (ref 2.2–3.9)
IgA: 411 mg/dL (ref 61–437)
IgG (Immunoglobin G), Serum: 899 mg/dL (ref 603–1613)
IgM (Immunoglobulin M), Srm: 15 mg/dL — ABNORMAL LOW (ref 20–172)
Total Protein ELP: 6.4 g/dL (ref 6.0–8.5)

## 2024-03-04 LAB — KAPPA/LAMBDA LIGHT CHAINS
Kappa free light chain: 15.9 mg/L (ref 3.3–19.4)
Kappa, lambda light chain ratio: 1.16 (ref 0.26–1.65)
Lambda free light chains: 13.7 mg/L (ref 5.7–26.3)

## 2024-03-11 ENCOUNTER — Encounter: Payer: Self-pay | Admitting: Oncology

## 2024-03-12 ENCOUNTER — Other Ambulatory Visit (HOSPITAL_COMMUNITY): Payer: Self-pay

## 2024-03-12 ENCOUNTER — Telehealth: Payer: Self-pay | Admitting: Pharmacy Technician

## 2024-03-12 NOTE — Telephone Encounter (Signed)
 Oral Oncology Patient Advocate Encounter  Prior Authorization for Lenalidomide  has been approved.    PA# [id number] Effective dates: 03/12/2024 through 04/10/2025  Patient can continue filling at [employer].   Mischell Branford ([name]) [name], CPhT  [facility], [name], [facility]/Oncology - Oral Chemotherapy Patient Advocate Specialist III Phone: [phone]  Fax: [phone]

## 2024-03-12 NOTE — Telephone Encounter (Signed)
 Oral Oncology Patient Advocate Encounter   Re-authorization   Received notification that prior authorization for Lenalidomide  is required.   PA submitted on CMM via [employer] [id number] Status is pending     Tommy Smith ([name]) [name], CPhT  [facility], [name], [facility] - Oral Chemotherapy Patient Advocate Specialist III Phone: [phone]  Fax: [phone]

## 2024-03-21 ENCOUNTER — Other Ambulatory Visit: Payer: Self-pay

## 2024-03-21 DIAGNOSIS — C9000 Multiple myeloma not having achieved remission: Secondary | ICD-10-CM

## 2024-03-21 MED ORDER — LENALIDOMIDE 5 MG PO CAPS: 5.0000 mg | ORAL_CAPSULE | Freq: Every day | ORAL | 0 refills | Status: DC

## 2024-03-29 ENCOUNTER — Inpatient Hospital Stay: Attending: Oncology

## 2024-03-29 DIAGNOSIS — C9000 Multiple myeloma not having achieved remission: Secondary | ICD-10-CM | POA: Insufficient documentation

## 2024-03-29 LAB — CMP (CANCER CENTER ONLY)
ALT: 16 U/L (ref 0–44)
AST: 18 U/L (ref 15–41)
Albumin: 4.1 g/dL (ref 3.5–5.0)
Alkaline Phosphatase: 75 U/L (ref 38–126)
Anion gap: 11 (ref 5–15)
BUN: 8 mg/dL (ref 8–23)
CO2: 24 mmol/L (ref 22–32)
Calcium: 9.2 mg/dL (ref 8.9–10.3)
Chloride: 102 mmol/L (ref 98–111)
Creatinine: 1.23 mg/dL (ref 0.61–1.24)
GFR, Estimated: >60 mL/min
Glucose, Bld: 84 mg/dL (ref 70–99)
Potassium: 4.1 mmol/L (ref 3.5–5.1)
Sodium: 137 mmol/L (ref 135–145)
Total Bilirubin: 1 mg/dL (ref 0.0–1.2)
Total Protein: 6.9 g/dL (ref 6.5–8.1)

## 2024-03-29 LAB — CBC
HCT: 43.7 % (ref 39.0–52.0)
Hemoglobin: 14.6 g/dL (ref 13.0–17.0)
MCH: 30.4 pg (ref 26.0–34.0)
MCHC: 33.4 g/dL (ref 30.0–36.0)
MCV: 91 fL (ref 80.0–100.0)
Platelets: 142 K/uL — ABNORMAL LOW (ref 150–400)
RBC: 4.8 MIL/uL (ref 4.22–5.81)
RDW: 15.7 % — ABNORMAL HIGH (ref 11.5–15.5)
WBC: 3.6 K/uL — ABNORMAL LOW (ref 4.0–10.5)
nRBC: 0 % (ref 0.0–0.2)

## 2024-04-26 ENCOUNTER — Other Ambulatory Visit: Payer: Self-pay

## 2024-04-26 DIAGNOSIS — C9000 Multiple myeloma not having achieved remission: Secondary | ICD-10-CM

## 2024-04-26 MED ORDER — LENALIDOMIDE 5 MG PO CAPS: ORAL_CAPSULE | ORAL | 0 refills | Status: AC

## 2024-04-29 ENCOUNTER — Inpatient Hospital Stay

## 2024-04-29 ENCOUNTER — Inpatient Hospital Stay: Admitting: Oncology

## 2024-04-29 ENCOUNTER — Inpatient Hospital Stay: Attending: Oncology

## 2024-04-29 ENCOUNTER — Encounter: Payer: Self-pay | Admitting: Oncology

## 2024-04-29 VITALS — BP 123/85 | HR 72 | Temp 97.4°F | Resp 18 | Wt 255.5 lb

## 2024-04-29 VITALS — BP 128/95 | HR 69 | Temp 98.2°F | Resp 18

## 2024-04-29 DIAGNOSIS — C9000 Multiple myeloma not having achieved remission: Secondary | ICD-10-CM

## 2024-04-29 DIAGNOSIS — Z5111 Encounter for antineoplastic chemotherapy: Secondary | ICD-10-CM

## 2024-04-29 DIAGNOSIS — I825Y1 Chronic embolism and thrombosis of unspecified deep veins of right proximal lower extremity: Secondary | ICD-10-CM

## 2024-04-29 DIAGNOSIS — Z720 Tobacco use: Secondary | ICD-10-CM | POA: Diagnosis not present

## 2024-04-29 DIAGNOSIS — R7989 Other specified abnormal findings of blood chemistry: Secondary | ICD-10-CM

## 2024-04-29 LAB — CMP (CANCER CENTER ONLY)
ALT: 15 U/L (ref 0–44)
AST: 17 U/L (ref 15–41)
Albumin: 4.2 g/dL (ref 3.5–5.0)
Alkaline Phosphatase: 83 U/L (ref 38–126)
Anion gap: 9 (ref 5–15)
BUN: 9 mg/dL (ref 8–23)
CO2: 26 mmol/L (ref 22–32)
Calcium: 9.6 mg/dL (ref 8.9–10.3)
Chloride: 102 mmol/L (ref 98–111)
Creatinine: 1.25 mg/dL — ABNORMAL HIGH (ref 0.61–1.24)
GFR, Estimated: >60 mL/min
Glucose, Bld: 86 mg/dL (ref 70–99)
Potassium: 3.6 mmol/L (ref 3.5–5.1)
Sodium: 138 mmol/L (ref 135–145)
Total Bilirubin: 0.8 mg/dL (ref 0.0–1.2)
Total Protein: 7.4 g/dL (ref 6.5–8.1)

## 2024-04-29 LAB — CBC (CANCER CENTER ONLY)
HCT: 45.1 % (ref 39.0–52.0)
Hemoglobin: 14.8 g/dL (ref 13.0–17.0)
MCH: 30.3 pg (ref 26.0–34.0)
MCHC: 32.8 g/dL (ref 30.0–36.0)
MCV: 92.2 fL (ref 80.0–100.0)
Platelet Count: 156 K/uL (ref 150–400)
RBC: 4.89 MIL/uL (ref 4.22–5.81)
RDW: 15.4 % (ref 11.5–15.5)
WBC Count: 3.4 K/uL — ABNORMAL LOW (ref 4.0–10.5)
nRBC: 0 % (ref 0.0–0.2)

## 2024-04-29 MED ORDER — ZOLEDRONIC ACID 4 MG/5ML IV CONC
3.5000 mg | Freq: Once | INTRAVENOUS | Status: AC
  Administered 2024-04-29: 3.5 mg via INTRAVENOUS
  Filled 2024-04-29: qty 4.38

## 2024-04-29 NOTE — Progress Notes (Signed)
 " Hematology/Oncology Progress note [phone] [phone]     CHIEF COMPLAINTS/REASON FOR VISIT:  Follow-up for multiple myeloma  ASSESSMENT & PLAN:   Cancer Staging  Multiple myeloma not having achieved remission (HCC) Staging form: Plasma Cell Myeloma and Plasma Cell Disorders, AJCC 8th Edition - Clinical stage from 02/04/2020: Beta-2 -microglobulin (mg/L): 2.1, Albumin (g/dL): 2.7, ISS: Stage II, High-risk cytogenetics: Absent, LDH: Unknown - Signed by Babara Call, MD on 05/06/2020   Multiple myeloma not having achieved remission (HCC) #IgG multiple myeloma, stage II, del 1 p, high risk.  Declined bone marrow transplant evaluation. 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 Patient is on maintenance Revlimid   5mg  3 weeks on 1 week off [ dose reduced due to neutropenia]  Labs are reviewed and discussed with patient. Proceed next cycle of Revlimid -  Patient prefers to see MD every 3 months. He agrees with getting cbc cmp done every month prior to starting next cycle of Revlimid .  Zometa  Q 3 months - proceed today next due April 2026  3.5mg  due to decreased GFR.   Chronic deep vein thrombosis (DVT) (HCC) He has been on anticoagulation with Eliquis  5mg  BID- he has a high copay and did not qualify for assistance.  Continue Xarelto . 20mg  daily Patient declined repeat lower extremity ultrasound for follow-up of DVT   Encounter for antineoplastic chemotherapy Treatment as planned.  Tobacco use Recommend smoke cessation.  Previously he declined CT chest lung cancer screening.    Elevated serum creatinine Encourage oral hydration and avoid nephrotoxins.      Orders Placed This  Encounter  Procedures   CMP (Cancer Center only)    Standing Status:   Future    Expected Date:   07/28/2024    Expiration Date:   10/26/2024   CBC with Differential (Cancer Center Only)    Standing Status:   Future    Expected Date:   07/28/2024    Expiration Date:   10/26/2024   Multiple Myeloma Panel (SPEP&IFE w/QIG)    Standing Status:   Future    Expected Date:   07/28/2024    Expiration Date:   10/26/2024   Kappa/lambda light chains    Standing Status:   Future    Expected Date:   07/28/2024    Expiration Date:   10/26/2024   CMP (Cancer Center only)    Standing Status:   Future    Expected Date:   06/27/2024    Expiration Date:   09/25/2024   CBC with Differential (Cancer Center Only)    Standing Status:   Future    Expected Date:   06/27/2024    Expiration Date:   09/25/2024   CMP (Cancer Center only)    Standing Status:   Future    Expected Date:   05/30/2024    Expiration Date:   08/28/2024   CBC with Differential (Cancer Center Only)    Standing Status:   Future    Expected Date:   05/30/2024    Expiration Date:   08/28/2024   Follow up in 3 months  All questions were answered. The patient knows to call the clinic with any problems, questions or concerns.  Call Babara, MD, PhD [facility] Hematology Oncology 04/29/2024  HISTORY OF PRESENTING ILLNESS:   Tommy Costilla. is a  69 y.o.  male presents for follow-up of management of multiple myeloma Oncology history summary listed as below  Oncology History  Multiple myeloma not having achieved remission (HCC)  01/29/2020 Imaging   CT showed multiple bilateral pulmonary nodules measuring up to 10 mm in the right middle lobe, nodules are indeterminate, infectious/inflammatory etiology versus metastatic disease. Multiple osseous lytic lesions, largest lesion involving the left pubic bone concerning for metastatic disease.Compression fractures at T9, age indeterminate. Appearance focal area of thickening at the  gastroesophageal junction.  Further evaluation with upper GI study or direct visualization with ndoscopy is recommended No bowel obstruction.Aortic atherosclerosis.   02/04/2020 Cancer Staging   Staging form: Plasma Cell Myeloma and Plasma Cell Disorders, AJCC 8th Edition - Clinical stage from 02/04/2020: Beta-2 -microglobulin (mg/L): 2.1, Albumin (g/dL): 2.7, ISS: Stage II, High-risk cytogenetics: Absent, LDH: Unknown - Signed by Babara Call, MD on 05/06/2020   02/21/2020 Initial Diagnosis   Multiple myeloma  Baseline M protein 4.1, kappa light chain level 333.8 Beta-2  microglobulin 2.1, albumin 2.7.  -02/12/2020, bone marrow biopsy showed hypercellular for age, 80% plasma cell which are complex restricted by light chain in situ heparinization.  Background hematopoiesis is present but reduced. Cytogenetics is normal, Myeloma FISH panel-deletion 1P,Standard risk.   02/27/2020 - 05/06/2021 Chemotherapy   VRd     09/01/2020 Bone Marrow Biopsy   Bone marrow results were reviewed.  1% plasma cell.  Normal cytogenetics.  Myeloma FISH negative for 1p del.   04/21/2021 Bone Marrow Biopsy   Bone marrow after 14 cycles of VRD -[January 2023 ]showed plasma cells 2%.-   06/25/2021 Bone Marrow Transplant   Patient underwent autologous bone marrow transplant at [facility]   09/2021 - 11/10/2022 Chemotherapy   Revlimid  10 mg 3 weeks on 1 week off   10/26/2021 Imaging   Bilateral lower extremity venous US  Partially occlusive right deep vein thrombus extending from the calf veins into the common femoral vein with limited examination of the left extremity demonstrating partially occlusive thrombus in the common femoral vein.     01/13/2022 Imaging   Bilateral lower extremity venous US  1. Extensive chronic predominantly occlusive/near occlusive right lower extremity DVT extending from the right common femoral vein through the imaged tibial veins, similar to slightly worsened compared to the 10/2021 examination with  suspected worsening of occlusive thrombus within the right popliteal vein. 2. Extensive predominantly occlusive/near occlusive left lower extremity DVT extending from the left common femoral vein through the imaged tibial veins, age-indeterminate in the absence of prior examinations though echogenic thrombus within the left femoral vein suggests a chronic etiology.     01/20/2022 Bone Marrow Biopsy   -Bone marrow biopsy at [facility] showed Cellular bone marrow aspirate with trilineage hematopoiesis and no morphologic evidence of plasma cell neoplasm.  -Negative minimal residual disease.   12/03/2022 -  Chemotherapy   Revlimid  5mg  3 weeks on, 1 week off.     #Focal area of thickening at the GE junction. 10/23/2020 EGD is normal.   #Lung nodules, measures up to 10 mm on the right middle lobe.  Indeterminate.  Attention on follow-up.  Recommend repeat CT chest and he declined  10/28/2011, unilateral right lower extremity ultrasound showed partially occlusive right deep vein thrombosis extending from the calf vein into the, femoral vein. Patient was recommended to start on Eliquis  for anticoagulation.  He tolerates well.  Right thigh pain has improved.  INTERVAL HISTORY Tommy Gretel Raddle.  is a 69 y.o. male who has above history reviewed by me today presents for follow up visit for multiple myeloma Current everyday smoker, 10 cigarettes daily.   Patient denies fever chills.  No new complaints. Takes calcium  supplements intermittently.   Review of Systems  Constitutional:  Negative for appetite change, chills, fatigue, fever and unexpected weight change.  HENT:   Negative for hearing loss and voice change.   Eyes:  Negative for eye problems and icterus.  Respiratory:  Negative for chest tightness, cough and shortness of breath.   Cardiovascular:  Negative for chest pain and leg swelling.  Gastrointestinal:  Negative for abdominal distention, abdominal pain and constipation.  Endocrine: Negative for  hot flashes.  Genitourinary:  Negative for difficulty urinating, dysuria and frequency.   Musculoskeletal:  Negative for arthralgias, back pain and neck pain.  Skin:  Negative for itching and rash.  Neurological:  Positive for numbness. Negative for light-headedness.  Hematological:  Negative for adenopathy. Does not bruise/bleed easily.  Psychiatric/Behavioral:  Negative for confusion.      MEDICAL HISTORY:  Past Medical History:  Diagnosis Date   Cancer (HCC)    Depression    Hypercalcemia 03/02/2020   Hyperlipemia    Hypertension    Hypogonadism in male    Multiple myeloma [facility])     SURGICAL HISTORY: Past Surgical History:  Procedure Laterality Date   BONE MARROW BIOPSY     COLONOSCOPY  07/14/2008   ESOPHAGOGASTRODUODENOSCOPY N/A 10/22/2020   Procedure: ESOPHAGOGASTRODUODENOSCOPY (EGD);  Surgeon: Therisa Bi, MD;  Location: [facility] ENDOSCOPY;  Service: Gastroenterology;  Laterality: N/A;    SOCIAL HISTORY: Social History   Socioeconomic History   Marital status: Married    Spouse name: Delphine   Number of children: 3   Years of education: Not on file   Highest education level: Not on file  Occupational History   Not on file  Tobacco Use   Smoking status: Every Day    Current packs/day: 0.50    Average packs/day: 0.5 packs/day for 30.0 years (15.0 ttl pk-yrs)    Types: Cigarettes   Smokeless tobacco: Never  Substance and Sexual Activity   Alcohol use: Yes    Comment: occcassional   Drug use: No   Sexual activity: Not on file  Other Topics Concern   Not on file  Social History Narrative   Not on file   Social Drivers of Health   Tobacco Use: High Risk (04/29/2024)   Patient History    Smoking Tobacco Use: Every Day    Smokeless Tobacco Use: Never    Passive Exposure: Not on file  Financial Resource Strain: Low Risk  (06/23/2023)   Received from [facility]   Overall Financial Resource Strain (CARDIA)    Difficulty of Paying Living  Expenses: Not hard at all  Food Insecurity: No Food Insecurity (06/23/2023)   Received from [facility]   Epic    Within the past 12 months, you worried that your food would run out before you got the money to buy more.: Never true    Within the past 12 months, the food you bought just didn't last and you didn't have money to get more.: Never true  Transportation Needs: No Transportation Needs (06/23/2023)   Received from [facility] - Transportation    In the past 12 months, has lack of transportation kept you from medical appointments or from getting medications?: No    Lack of  Transportation (Non-Medical): No  Physical Activity: Not on file  Stress: Not on file  Social Connections: Not on file  Intimate Partner Violence: Not At Risk (11/10/2022)   Humiliation, Afraid, Rape, and Kick questionnaire    Fear of Current or Ex-Partner: No    Emotionally Abused: No    Physically Abused: No    Sexually Abused: No  Depression (PHQ2-9): Medium Risk (03/01/2024)   Depression (PHQ2-9)    PHQ-2 Score: 5  Alcohol Screen: Not on file  Housing: Low Risk  (06/23/2023)   Received from [facility]   Epic    In the last 12 months, was there a time when you were not able to pay the mortgage or rent on time?: No    In the past 12 months, how many times have you moved where you were living?: 0    At any time in the past 12 months, were you homeless or living in a shelter (including now)?: No  Utilities: Not At Risk (06/23/2023)   Received from [facility] Utilities    Threatened with loss of utilities: No  Health Literacy: Adequate Health Literacy (11/10/2022)   B1300 Health Literacy    Frequency of need for help with medical instructions: Never    FAMILY HISTORY: Family History  Problem Relation Age of Onset   Skin cancer Mother    Prostate cancer Father     ALLERGIES:  has no known allergies.  MEDICATIONS:   Current Outpatient Medications  Medication Sig Dispense Refill   buPROPion (WELLBUTRIN XL) 300 MG 24 hr tablet Take 300 mg by mouth daily.     cholecalciferol (VITAMIN D3) 25 MCG (1000 UNIT) tablet Take 2,000 Units by mouth daily.     lenalidomide  (REVLIMID ) 5 MG capsule Take 1 capsule by mouth once daily for 21 days, then 7 days off. 21 capsule 0   rivaroxaban  (XARELTO ) 20 MG TABS tablet Take 1 tablet (20 mg total) by mouth daily with supper. 30 tablet 5   No current facility-administered medications for this visit.     PHYSICAL EXAMINATION: ECOG PERFORMANCE STATUS: 1 - Symptomatic but completely ambulatory Vitals:   04/29/24 1337  BP: 123/85  Pulse: 72  Resp: 18  Temp: (!) 97.4 F (36.3 C)  SpO2: 98%    Filed Weights   04/29/24 1337  Weight: 255 lb 8 oz (115.9 kg)     Physical Exam Constitutional:      General: He is not in acute distress.    Appearance: He is not diaphoretic.  HENT:     Head: Normocephalic and atraumatic.     Nose: Nose normal.     Mouth/Throat:     Pharynx: No oropharyngeal exudate.  Eyes:     General: No scleral icterus.    Pupils: Pupils are equal, round, and reactive to light.  Cardiovascular:     Rate and Rhythm: Normal rate.  Pulmonary:     Effort: Pulmonary effort is normal. No respiratory distress.     Breath sounds: No wheezing.  Abdominal:     General: There is no distension.     Palpations: Abdomen is soft.     Tenderness: There is no abdominal tenderness.  Musculoskeletal:        General: Normal range of motion.     Cervical back: Normal range of motion.     Left lower leg: Edema present.  Skin:    General: Skin is warm and dry.  Findings: No erythema.  Neurological:     Mental Status: He is alert and oriented to person, place, and time. Mental status is at baseline.     Cranial Nerves: No cranial nerve deficit.     Motor: No abnormal muscle tone.  Psychiatric:        Mood and Affect: Mood and affect normal.        LABORATORY DATA:  I have reviewed the data as listed    Latest Ref Rng & Units 04/29/2024    1:14 PM 03/29/2024   11:25 AM 03/01/2024   11:02 AM  CBC  WBC 4.0 - 10.5 K/uL 3.4  3.6  3.7   Hemoglobin 13.0 - 17.0 g/dL 85.1  85.3  84.7   Hematocrit 39.0 - 52.0 % 45.1  43.7  45.5   Platelets 150 - 400 K/uL 156  142  162       Latest Ref Rng & Units 04/29/2024    1:13 PM 03/29/2024   11:25 AM 03/01/2024   11:02 AM  CMP  Glucose 70 - 99 mg/dL 86  84  87   BUN 8 - 23 mg/dL 9  8  13    Creatinine 0.61 - 1.24 mg/dL 8.74  8.76  8.77   Sodium 135 - 145 mmol/L 138  137  136   Potassium 3.5 - 5.1 mmol/L 3.6  4.1  4.5   Chloride 98 - 111 mmol/L 102  102  103   CO2 22 - 32 mmol/L 26  24  25    Calcium  8.9 - 10.3 mg/dL 9.6  9.2  8.9   Total Protein 6.5 - 8.1 g/dL 7.4  6.9  7.1   Total Bilirubin 0.0 - 1.2 mg/dL 0.8  1.0  1.0   Alkaline Phos 38 - 126 U/L 83  75  48   AST 15 - 41 U/L 17  18  15    ALT 0 - 44 U/L 15  16  16      RADIOGRAPHIC STUDIES: I have personally reviewed the radiological images as listed and agreed with the findings in the report. No results found.  "

## 2024-04-29 NOTE — Assessment & Plan Note (Signed)
Treatment as planned 

## 2024-04-29 NOTE — Assessment & Plan Note (Signed)
 Recommend smoke cessation.  Previously he declined CT chest lung cancer screening.

## 2024-04-29 NOTE — Assessment & Plan Note (Signed)
 Encourage oral hydration and avoid nephrotoxins.

## 2024-04-29 NOTE — Assessment & Plan Note (Signed)
#  IgG multiple myeloma, stage II, del 1 p, high risk.  Declined bone marrow transplant evaluation. 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 Patient is on maintenance Revlimid   5mg  3 weeks on 1 week off [ dose reduced due to neutropenia]  Labs are reviewed and discussed with patient. Proceed next cycle of Revlimid -  Patient prefers to see MD every 3 months. He agrees with getting cbc cmp done every month prior to starting next cycle of Revlimid .  Zometa  Q 3 months - proceed today next due April 2026  3.5mg  due to decreased GFR.

## 2024-04-29 NOTE — Assessment & Plan Note (Signed)
 He has been on anticoagulation with Eliquis  5mg  BID- he has a high copay and did not qualify for assistance.  Continue Xarelto . 20mg  daily Patient declined repeat lower extremity ultrasound for follow-up of DVT

## 2024-04-30 ENCOUNTER — Telehealth: Payer: Self-pay | Admitting: *Deleted

## 2024-04-30 ENCOUNTER — Other Ambulatory Visit (HOSPITAL_COMMUNITY): Payer: Self-pay

## 2024-04-30 LAB — KAPPA/LAMBDA LIGHT CHAINS
Kappa free light chain: 22.7 mg/L — ABNORMAL HIGH (ref 3.3–19.4)
Kappa, lambda light chain ratio: 1.16 (ref 0.26–1.65)
Lambda free light chains: 19.6 mg/L (ref 5.7–26.3)

## 2024-04-30 NOTE — Telephone Encounter (Signed)
 [facility] ([id number]) called triage requesting a prior auth on the revlimid  script. Msg send to the Oncology PA team.

## 2024-05-01 ENCOUNTER — Telehealth: Payer: Self-pay | Admitting: Pharmacy Technician

## 2024-05-01 ENCOUNTER — Other Ambulatory Visit (HOSPITAL_COMMUNITY): Payer: Self-pay

## 2024-05-01 LAB — MULTIPLE MYELOMA PANEL, SERUM
Albumin SerPl Elph-Mcnc: 3.6 g/dL (ref 2.9–4.4)
Albumin/Glob SerPl: 1.1 (ref 0.7–1.7)
Alpha 1: 0.3 g/dL (ref 0.0–0.4)
Alpha2 Glob SerPl Elph-Mcnc: 0.8 g/dL (ref 0.4–1.0)
B-Globulin SerPl Elph-Mcnc: 1.5 g/dL — ABNORMAL HIGH (ref 0.7–1.3)
Gamma Glob SerPl Elph-Mcnc: 0.8 g/dL (ref 0.4–1.8)
Globulin, Total: 3.4 g/dL (ref 2.2–3.9)
IgA: 449 mg/dL — ABNORMAL HIGH (ref 61–437)
IgG (Immunoglobin G), Serum: 898 mg/dL (ref 603–1613)
IgM (Immunoglobulin M), Srm: 15 mg/dL — ABNORMAL LOW (ref 20–172)
Total Protein ELP: 7 g/dL (ref 6.0–8.5)

## 2024-05-01 NOTE — Telephone Encounter (Signed)
 Oral Oncology Patient Advocate Encounter  Prior Authorization for brand Revlimid  has been approved.    PA# [id number] Effective dates: 05/01/2024 through 04/10/2025  Patient can continue to fill through [facility].  Pharmacy has been notified.   Tiffiney Sparrow ([name]) [name], CPhT  [facility], [name], [facility] - Oral Chemotherapy Patient Advocate Specialist III Phone: [phone]  Fax: [phone]

## 2024-05-01 NOTE — Telephone Encounter (Signed)
 Oral Oncology Patient Advocate Encounter   New authorization - patient on generic lenalidomide   Prior authorization requested for brand until patient's pharmacy can obtain generic  Pharmacy was not able to fill brand for the generic even with the appropriate DAW code attached   Received notification that prior authorization for Revlimid  is required.   PA submitted on cmm via [employer] [id number] Status is pending     Jericka Kadar ([name]) [name], CPhT  [facility], [name], [facility] - Oral Chemotherapy Patient Advocate Specialist III Phone: [phone]  Fax: [phone]

## 2024-05-30 ENCOUNTER — Inpatient Hospital Stay

## 2024-06-27 ENCOUNTER — Inpatient Hospital Stay

## 2024-07-29 ENCOUNTER — Inpatient Hospital Stay

## 2024-07-29 ENCOUNTER — Inpatient Hospital Stay: Admitting: Oncology
